# Patient Record
Sex: Female | Born: 1950
Health system: Southern US, Community
[De-identification: ages and names within clinical notes are randomized; demographics above are authoritative.]

## PROBLEM LIST (undated history)

## (undated) DIAGNOSIS — C44320 Squamous cell carcinoma of skin of unspecified parts of face: Secondary | ICD-10-CM

## (undated) DIAGNOSIS — F32A Depression, unspecified: Secondary | ICD-10-CM

## (undated) DIAGNOSIS — M545 Low back pain, unspecified: Secondary | ICD-10-CM

## (undated) DIAGNOSIS — F319 Bipolar disorder, unspecified: Secondary | ICD-10-CM

## (undated) DIAGNOSIS — M21612 Bunion of left foot: Secondary | ICD-10-CM

## (undated) DIAGNOSIS — M1990 Unspecified osteoarthritis, unspecified site: Secondary | ICD-10-CM

## (undated) DIAGNOSIS — I10 Essential (primary) hypertension: Secondary | ICD-10-CM

## (undated) DIAGNOSIS — I639 Cerebral infarction, unspecified: Secondary | ICD-10-CM

## (undated) DIAGNOSIS — F329 Major depressive disorder, single episode, unspecified: Secondary | ICD-10-CM

## (undated) DIAGNOSIS — Z136 Encounter for screening for cardiovascular disorders: Secondary | ICD-10-CM

## (undated) HISTORY — DX: Bunion of left foot: M21.612

## (undated) HISTORY — DX: Essential (primary) hypertension: I10

## (undated) HISTORY — DX: Major depressive disorder, single episode, unspecified: F32.9

## (undated) HISTORY — PX: BLADDER SURGERY: SHX569

## (undated) HISTORY — PX: TUBAL LIGATION: SHX77

## (undated) HISTORY — DX: Squamous cell carcinoma of skin of unspecified parts of face: C44.320

## (undated) HISTORY — PX: KNEE ARTHROSCOPY: SUR90

## (undated) HISTORY — DX: Depression, unspecified: F32.A

## (undated) HISTORY — DX: Bipolar disorder, unspecified: F31.9

## (undated) HISTORY — PX: BLADDER AUGMENTATION: SHX1233

## (undated) HISTORY — DX: Encounter for screening for cardiovascular disorders: Z13.6

---

## 1998-06-03 ENCOUNTER — Ambulatory Visit (HOSPITAL_COMMUNITY): Admission: RE | Admit: 1998-06-03 | Discharge: 1998-06-03 | Payer: Self-pay | Admitting: Emergency Medicine

## 2002-04-14 ENCOUNTER — Emergency Department (HOSPITAL_COMMUNITY): Admission: EM | Admit: 2002-04-14 | Discharge: 2002-04-14 | Payer: Self-pay | Admitting: Emergency Medicine

## 2002-04-14 ENCOUNTER — Encounter: Payer: Self-pay | Admitting: Emergency Medicine

## 2002-04-15 ENCOUNTER — Ambulatory Visit (HOSPITAL_COMMUNITY): Admission: RE | Admit: 2002-04-15 | Discharge: 2002-04-15 | Payer: Self-pay | Admitting: Orthopedic Surgery

## 2002-04-15 ENCOUNTER — Encounter: Payer: Self-pay | Admitting: Orthopedic Surgery

## 2004-04-01 ENCOUNTER — Inpatient Hospital Stay: Payer: Self-pay | Admitting: Psychiatry

## 2004-04-01 ENCOUNTER — Other Ambulatory Visit: Payer: Self-pay

## 2004-04-05 ENCOUNTER — Ambulatory Visit: Payer: Self-pay | Admitting: Psychiatry

## 2004-12-20 ENCOUNTER — Encounter: Payer: Self-pay | Admitting: Psychiatry

## 2005-01-01 ENCOUNTER — Encounter: Payer: Self-pay | Admitting: Psychiatry

## 2005-01-11 ENCOUNTER — Ambulatory Visit: Payer: Self-pay

## 2005-02-01 ENCOUNTER — Encounter: Payer: Self-pay | Admitting: Psychiatry

## 2005-03-07 ENCOUNTER — Ambulatory Visit: Payer: Self-pay | Admitting: Psychiatry

## 2005-04-28 ENCOUNTER — Ambulatory Visit: Payer: Self-pay | Admitting: Psychiatry

## 2005-08-17 ENCOUNTER — Ambulatory Visit: Payer: Self-pay | Admitting: Psychiatry

## 2005-12-06 ENCOUNTER — Ambulatory Visit: Payer: Self-pay | Admitting: Psychiatry

## 2006-02-16 ENCOUNTER — Ambulatory Visit: Payer: Self-pay | Admitting: Psychiatry

## 2006-05-17 ENCOUNTER — Ambulatory Visit: Payer: Self-pay

## 2006-06-26 ENCOUNTER — Ambulatory Visit: Payer: Self-pay | Admitting: Psychiatry

## 2006-12-20 ENCOUNTER — Ambulatory Visit: Payer: Self-pay | Admitting: Psychiatry

## 2007-06-07 ENCOUNTER — Ambulatory Visit: Payer: Self-pay | Admitting: Psychiatry

## 2007-06-26 ENCOUNTER — Ambulatory Visit: Payer: Self-pay

## 2007-10-11 ENCOUNTER — Ambulatory Visit: Payer: Self-pay | Admitting: Psychiatry

## 2008-05-19 ENCOUNTER — Ambulatory Visit: Payer: Self-pay | Admitting: Psychiatry

## 2008-06-15 ENCOUNTER — Ambulatory Visit: Payer: Self-pay | Admitting: Psychiatry

## 2008-06-22 ENCOUNTER — Ambulatory Visit: Payer: Self-pay | Admitting: Internal Medicine

## 2008-06-24 ENCOUNTER — Ambulatory Visit: Payer: Self-pay | Admitting: Internal Medicine

## 2008-06-29 ENCOUNTER — Ambulatory Visit: Payer: Self-pay | Admitting: Internal Medicine

## 2008-07-02 ENCOUNTER — Ambulatory Visit: Payer: Self-pay | Admitting: Internal Medicine

## 2008-07-21 ENCOUNTER — Ambulatory Visit: Payer: Self-pay | Admitting: Internal Medicine

## 2008-07-31 ENCOUNTER — Ambulatory Visit: Payer: Self-pay | Admitting: Internal Medicine

## 2008-08-01 ENCOUNTER — Ambulatory Visit: Payer: Self-pay | Admitting: Internal Medicine

## 2008-09-29 ENCOUNTER — Other Ambulatory Visit: Payer: Self-pay | Admitting: Psychiatry

## 2008-10-20 LAB — HM PAP SMEAR: HM Pap smear: NORMAL

## 2008-12-08 ENCOUNTER — Ambulatory Visit: Payer: Self-pay | Admitting: Internal Medicine

## 2008-12-09 ENCOUNTER — Other Ambulatory Visit: Payer: Self-pay | Admitting: Internal Medicine

## 2008-12-14 ENCOUNTER — Ambulatory Visit: Payer: Self-pay | Admitting: Urology

## 2008-12-15 ENCOUNTER — Ambulatory Visit: Payer: Self-pay | Admitting: Urology

## 2009-05-12 ENCOUNTER — Other Ambulatory Visit: Payer: Self-pay | Admitting: Psychiatry

## 2009-05-20 ENCOUNTER — Inpatient Hospital Stay: Payer: Self-pay | Admitting: Internal Medicine

## 2009-07-06 ENCOUNTER — Ambulatory Visit: Payer: Self-pay

## 2009-10-27 ENCOUNTER — Other Ambulatory Visit: Payer: Self-pay | Admitting: Internal Medicine

## 2010-02-17 ENCOUNTER — Other Ambulatory Visit: Payer: Self-pay | Admitting: Psychiatry

## 2010-03-01 ENCOUNTER — Encounter: Payer: Self-pay | Admitting: Physician Assistant

## 2010-03-11 ENCOUNTER — Ambulatory Visit: Payer: Self-pay | Admitting: Internal Medicine

## 2010-03-16 ENCOUNTER — Ambulatory Visit: Payer: Self-pay | Admitting: Internal Medicine

## 2010-03-16 ENCOUNTER — Ambulatory Visit: Payer: Self-pay | Admitting: Pain Medicine

## 2010-03-22 ENCOUNTER — Ambulatory Visit: Payer: Self-pay | Admitting: Pain Medicine

## 2010-04-03 HISTORY — PX: TOTAL HIP ARTHROPLASTY: SHX124

## 2010-04-03 HISTORY — PX: JOINT REPLACEMENT: SHX530

## 2010-04-06 ENCOUNTER — Ambulatory Visit: Payer: Self-pay | Admitting: Pain Medicine

## 2010-04-17 ENCOUNTER — Ambulatory Visit: Payer: Self-pay | Admitting: General Practice

## 2010-05-20 ENCOUNTER — Other Ambulatory Visit: Payer: Self-pay | Admitting: Physician Assistant

## 2010-05-20 ENCOUNTER — Ambulatory Visit: Payer: Self-pay | Admitting: General Practice

## 2010-05-24 ENCOUNTER — Encounter: Payer: Self-pay | Admitting: Orthopedic Surgery

## 2010-06-02 ENCOUNTER — Encounter: Payer: Self-pay | Admitting: Orthopedic Surgery

## 2010-08-05 ENCOUNTER — Ambulatory Visit: Payer: Self-pay

## 2010-12-13 ENCOUNTER — Other Ambulatory Visit: Payer: Self-pay | Admitting: *Deleted

## 2010-12-13 MED ORDER — LOSARTAN POTASSIUM 50 MG PO TABS: 50.0000 mg | ORAL_TABLET | Freq: Every day | ORAL | Status: DC

## 2011-01-13 ENCOUNTER — Ambulatory Visit: Payer: Self-pay | Admitting: Internal Medicine

## 2011-08-21 ENCOUNTER — Encounter: Payer: Self-pay | Admitting: Orthopedic Surgery

## 2011-08-31 ENCOUNTER — Ambulatory Visit (INDEPENDENT_AMBULATORY_CARE_PROVIDER_SITE_OTHER): Payer: PRIVATE HEALTH INSURANCE | Admitting: Internal Medicine

## 2011-08-31 ENCOUNTER — Encounter: Payer: Self-pay | Admitting: Internal Medicine

## 2011-08-31 VITALS — BP 152/96 | HR 108 | Temp 98.3°F | Resp 16

## 2011-08-31 DIAGNOSIS — R079 Chest pain, unspecified: Secondary | ICD-10-CM

## 2011-08-31 DIAGNOSIS — D649 Anemia, unspecified: Secondary | ICD-10-CM

## 2011-08-31 DIAGNOSIS — I10 Essential (primary) hypertension: Secondary | ICD-10-CM | POA: Insufficient documentation

## 2011-08-31 DIAGNOSIS — F319 Bipolar disorder, unspecified: Secondary | ICD-10-CM | POA: Insufficient documentation

## 2011-08-31 DIAGNOSIS — D509 Iron deficiency anemia, unspecified: Secondary | ICD-10-CM | POA: Insufficient documentation

## 2011-08-31 NOTE — Progress Notes (Signed)
Subjective:    Patient ID: Katherine Jimenez, female    DOB: Sep 08, 1950, 61 y.o.   MRN: [id number]  HPI 61 year old female with history of anxiety, bipolar disorder, and hypertension presents for acute visit complaining of anemia and chest pain. She reports that approximately 2 weeks ago she tried to donate blood and was turned down because she was anemic with hemoglobin less than 12. This was never investigated further. Later that week, she was sitting and had sudden onset of squeezing pain in her mid sternum. This pain persisted for approximately 2 hours. It resolved without intervention. The pain did not radiate. It was associated with some shortness of breath, but no palpitations, diaphoresis, or nausea. She felt well until approximately 3 days later when she had a second episode of squeezing substernal chest pain which lasted for approximately one hour. She has also noticed some shortness of breath occasionally when laying down. She denies any palpitations. She denies any recent increased anxiety or stressors. She denies fatigue. She has not had any change in appetite, abdominal pain, change in bowel habits. She does take Zantac occasionally for GERD, which she describes as well controlled. She has never had colonoscopy (declined in past).  Outpatient Encounter Prescriptions as of 08/31/2011  Medication Sig Dispense Refill  . ALPRAZolam (XANAX) 0.5 MG tablet Take 0.5 mg by mouth at bedtime as needed.      Marland Kitchen aspirin 81 MG tablet Take 81 mg by mouth daily.      . Doxepin HCl (SILENOR) 6 MG TABS Take by mouth.      . lamoTRIgine (LAMICTAL) 200 MG tablet Take 200 mg by mouth 2 (two) times daily.      Marland Kitchen losartan (COZAAR) 50 MG tablet Take 1 tablet (50 mg total) by mouth daily.  30 tablet  11    Review of Systems  Constitutional: Negative for fever, chills, appetite change, fatigue and unexpected weight change.  HENT: Negative for ear pain, congestion, sore throat, trouble swallowing, neck pain, voice  change and sinus pressure.   Eyes: Negative for visual disturbance.  Respiratory: Positive for chest tightness and shortness of breath. Negative for cough, wheezing and stridor.   Cardiovascular: Positive for chest pain. Negative for palpitations and leg swelling.  Gastrointestinal: Negative for nausea, vomiting, abdominal pain, diarrhea, constipation, blood in stool, abdominal distention and anal bleeding.  Genitourinary: Negative for dysuria and flank pain.  Musculoskeletal: Negative for myalgias, arthralgias and gait problem.  Skin: Negative for color change and rash.  Neurological: Negative for dizziness and headaches.  Hematological: Negative for adenopathy. Does not bruise/bleed easily.  Psychiatric/Behavioral: Negative for suicidal ideas, sleep disturbance and dysphoric mood. The patient is nervous/anxious.    BP 152/96  Pulse 108  Temp(Src) 98.3 F (36.8 C) (Oral)  Resp 16  SpO2 97%     Objective:   Physical Exam  Constitutional: She is oriented to person, place, and time. She appears well-developed and well-nourished. No distress.  HENT:  Head: Normocephalic and atraumatic.  Right Ear: External ear normal.  Left Ear: External ear normal.  Nose: Nose normal.  Mouth/Throat: Oropharynx is clear and moist. No oropharyngeal exudate.  Eyes: Conjunctivae are normal. Pupils are equal, round, and reactive to light. Right eye exhibits no discharge. Left eye exhibits no discharge. No scleral icterus.  Neck: Normal range of motion. Neck supple. No tracheal deviation present. No thyromegaly present.  Cardiovascular: Normal rate, regular rhythm, normal heart sounds and intact distal pulses.  Exam reveals no gallop and no friction  rub.   No murmur heard. Pulmonary/Chest: Effort normal and breath sounds normal. No respiratory distress. She has no wheezes. She has no rales. She exhibits no tenderness.  Musculoskeletal: Normal range of motion. She exhibits no edema and no tenderness.    Lymphadenopathy:    She has no cervical adenopathy.  Neurological: She is alert and oriented to person, place, and time. No cranial nerve deficit. She exhibits normal muscle tone. Coordination normal.  Skin: Skin is warm and dry. No rash noted. She is not diaphoretic. No erythema. No pallor.  Psychiatric: She has a normal mood and affect. Her behavior is normal. Judgment and thought content normal.          Assessment & Plan:

## 2011-08-31 NOTE — Assessment & Plan Note (Signed)
BP well controlled today. Will check renal function with labs. Will continue Cozaar.

## 2011-08-31 NOTE — Assessment & Plan Note (Addendum)
Chest pain at rest atypical for angina, however pt has h/o DM and HTN and strong family h/o CAD.  Pt had Treadmill stress at [facility] in 2011 which was normal. EKG today normal. Will send labs including CMP, TSH, CBC, troponin. Will set up cardiology evaluation. Question if she would benefit from either repeat treadmill or nuclear stress test.  Alternative etiologies of chest pain include GERD, however pt on Zantac, or esophageal spasm. No other symptoms such as palpitations to suggest arrythmia.  No history of pulmonary issues, and pulmonary exam normal. Will also check CBC today given history of anemia. Demand ischemia would also be consideration.

## 2011-08-31 NOTE — Assessment & Plan Note (Signed)
Patient reports being told she was anemic when trying to donate blood. Will check CBC with labs today.

## 2011-08-31 NOTE — Assessment & Plan Note (Signed)
Symptoms stable on Lamictal. Will continue.

## 2011-09-01 ENCOUNTER — Ambulatory Visit (INDEPENDENT_AMBULATORY_CARE_PROVIDER_SITE_OTHER): Payer: PRIVATE HEALTH INSURANCE | Admitting: Cardiovascular Disease

## 2011-09-01 ENCOUNTER — Encounter: Payer: Self-pay | Admitting: Cardiovascular Disease

## 2011-09-01 VITALS — BP 150/90 | HR 101 | Ht 62.0 in | Wt 158.0 lb

## 2011-09-01 DIAGNOSIS — R079 Chest pain, unspecified: Secondary | ICD-10-CM

## 2011-09-01 DIAGNOSIS — I10 Essential (primary) hypertension: Secondary | ICD-10-CM

## 2011-09-01 DIAGNOSIS — R0602 Shortness of breath: Secondary | ICD-10-CM

## 2011-09-01 LAB — CBC WITH DIFFERENTIAL/PLATELET
Basophils Absolute: 0 10*3/uL (ref 0.0–0.1)
Basophils Relative: 0.4 % (ref 0.0–3.0)
Eosinophils Absolute: 0.1 10*3/uL (ref 0.0–0.7)
Eosinophils Relative: 2.1 % (ref 0.0–5.0)
HCT: 39.7 % (ref 36.0–46.0)
Hemoglobin: 13.1 g/dL (ref 12.0–15.0)
Lymphocytes Relative: 30.1 % (ref 12.0–46.0)
Lymphs Abs: 1.6 10*3/uL (ref 0.7–4.0)
MCHC: 33.1 g/dL (ref 30.0–36.0)
MCV: 92.3 fl (ref 78.0–100.0)
Monocytes Absolute: 0.4 10*3/uL (ref 0.1–1.0)
Monocytes Relative: 7.1 % (ref 3.0–12.0)
Neutro Abs: 3.1 10*3/uL (ref 1.4–7.7)
Neutrophils Relative %: 60.3 % (ref 43.0–77.0)
Platelets: 250 10*3/uL (ref 150.0–400.0)
RBC: 4.3 Mil/uL (ref 3.87–5.11)
RDW: 13.5 % (ref 11.5–14.6)
WBC: 5.2 10*3/uL (ref 4.5–10.5)

## 2011-09-01 LAB — LDL CHOLESTEROL, DIRECT: Direct LDL: 116 mg/dL

## 2011-09-01 LAB — COMPREHENSIVE METABOLIC PANEL
ALT: 14 U/L (ref 0–35)
AST: 18 U/L (ref 0–37)
Albumin: 4.4 g/dL (ref 3.5–5.2)
Alkaline Phosphatase: 88 U/L (ref 39–117)
BUN: 11 mg/dL (ref 6–23)
CO2: 26 mEq/L (ref 19–32)
Calcium: 9.8 mg/dL (ref 8.4–10.5)
Chloride: 103 mEq/L (ref 96–112)
Creatinine, Ser: 0.9 mg/dL (ref 0.4–1.2)
GFR: 70.7 mL/min (ref >60.00)
Glucose, Bld: 121 mg/dL — ABNORMAL HIGH (ref 70–99)
Potassium: 4.5 mEq/L (ref 3.5–5.1)
Sodium: 139 mEq/L (ref 135–145)
Total Bilirubin: 0.5 mg/dL (ref 0.3–1.2)
Total Protein: 7.2 g/dL (ref 6.0–8.3)

## 2011-09-01 LAB — LIPID PANEL
Cholesterol: 245 mg/dL — ABNORMAL HIGH (ref 0–200)
HDL: 118 mg/dL (ref >39.00)
Total CHOL/HDL Ratio: 2
Triglycerides: 74 mg/dL (ref 0.0–149.0)
VLDL: 14.8 mg/dL (ref 0.0–40.0)

## 2011-09-01 NOTE — Assessment & Plan Note (Signed)
Her blood pressure is elevated but she does seem to be anxious. If this remains elevated upon followup, a small dose beta blocker might be helpful.

## 2011-09-01 NOTE — Patient Instructions (Signed)
Your physician has requested that you have en exercise stress myoview. For further information please visit [email or web address]. Please follow instruction sheet, as given.  Your physician has requested that you have an echocardiogram. Echocardiography is a painless test that uses sound waves to create images of your heart. It provides your doctor with information about the size and shape of your heart and how well your heart's chambers and valves are working. This procedure takes approximately one hour. There are no restrictions for this procedure.  Follow up after tests.  

## 2011-09-01 NOTE — Assessment & Plan Note (Signed)
The patient's symptoms are somewhat atypical for angina. However, she has risk factors for coronary artery disease. Her physical exam is unremarkable except for mild tachycardia. ECG does not show any acute changes. She had a previous ischemic workup more than 2 years ago which was unremarkable. I recommend a treadmill nuclear stress test for further evaluation. I will also obtain an echocardiogram due to her increased dyspnea.  Her labs are still not back to review but will need to rule out significant underlying anemia which might be causing some supply demand ischemia.

## 2011-09-01 NOTE — Progress Notes (Signed)
HPI  61 year old female with history of anxiety, bipolar disorder, and hypertension who was referred by Dr. Dan Humphreys for evaluation of chest pain.  She reports that approximately 2 weeks ago she tried to donate blood and was turned down because she was anemic with hemoglobin less than 12. This was never investigated further. Later that week, she was sitting and had sudden onset of squeezing pain in her mid sternum. This pain persisted for approximately 2 hours. It resolved without intervention. The pain did not radiate. It was associated with some shortness of breath, but no palpitations, diaphoresis, or nausea. The discomfort did not worsen with physical activities. She felt well until approximately 3 days later when she had a second episode of squeezing substernal chest pain which lasted for approximately one hour. She has also noticed some shortness of breath occasionally when laying down. She denies any palpitations. She denies any recent increased anxiety or stressors. She denies fatigue. She has not had any change in appetite, abdominal pain, change in bowel habits. She does take Zantac occasionally for GERD, which she describes as well controlled.  She has no previous cardiac history. She was hospitalized in 2011 at [facility] with atypical chest pain. She had a CTA of the chest which showed no evidence of aortic aneurysm or pulmonary embolism. She ruled out for myocardial infarction and underwent a treadmill nuclear stress test which showed no evidence of ischemia with normal ejection fraction.  No Known Allergies   Current Outpatient Prescriptions on File Prior to Visit  Medication Sig Dispense Refill  . ALPRAZolam (XANAX) 0.5 MG tablet Take 0.5 mg by mouth at bedtime as needed.      Marland Kitchen aspirin 81 MG tablet Take 81 mg by mouth daily.      . Doxepin HCl (SILENOR) 6 MG TABS Take by mouth.      . lamoTRIgine (LAMICTAL) 200 MG tablet Take 200 mg by mouth 2 (two) times daily.      Marland Kitchen losartan (COZAAR)  50 MG tablet Take 1 tablet (50 mg total) by mouth daily.  30 tablet  11     Past Medical History  Diagnosis Date  . Hypertension   . Treadmill stress test negative for angina pectoris 2011  . Squamous cell skin cancer, face     [facility]  . Bunion, left   . Bipolar disorder   . Diabetes mellitus     diet controlled.      Past Surgical History  Procedure Date  . Joint replacement 2012    bilateral hip  . Bladder augmentation   . Knee arthroscopy   . Vaginal delivery     2  . Tubal ligation   . Total hip arthroplasty 2012     Family History  Problem Relation Age of Onset  . Heart disease Mother   . Stroke Mother   . Heart disease Father   . Heart disease Maternal Grandmother   . Heart disease Maternal Grandfather   . Heart disease Paternal Grandmother   . Heart disease Paternal Grandfather      History   Social History  . Marital Status: Divorced    Spouse Name: N/A    Number of Children: N/A  . Years of Education: N/A   Occupational History  . Not on file.   Social History Main Topics  . Smoking status: Never Smoker   . Smokeless tobacco: Never Used  . Alcohol Use: Yes     occassionally  . Drug Use:  No  . Sexually Active: Not on file   Other Topics Concern  . Not on file   Social History Narrative   Lives in [place] with husband.     ROS Constitutional: Negative for fever, chills, diaphoresis, activity change, appetite change and fatigue.  HENT: Negative for hearing loss, nosebleeds, congestion, sore throat, facial swelling, drooling, trouble swallowing, neck pain, voice change, sinus pressure and tinnitus.  Eyes: Negative for photophobia, pain, discharge and visual disturbance.  Respiratory: Negative for apnea, cough and wheezing.  Cardiovascular: Negative for  and leg swelling.  Gastrointestinal: Negative for nausea, vomiting, abdominal pain, diarrhea, constipation, blood in stool and abdominal distention.  Genitourinary: Negative for  dysuria, urgency, frequency, hematuria and decreased urine volume.  Musculoskeletal: Negative for myalgias, back pain, joint swelling, arthralgias and gait problem.  Skin: Negative for color change, pallor, rash and wound.  Neurological: Negative for dizziness, tremors, seizures, syncope, speech difficulty, weakness, light-headedness, numbness and headaches.  Psychiatric/Behavioral: Negative for suicidal ideas, hallucinations, behavioral problems and agitation. The patient is nervous/anxious.     PHYSICAL EXAM   BP 150/90  Pulse 101  Ht 5\' 2"  (1.575 m)  Wt 158 lb (71.668 kg)  BMI 28.90 kg/m2  Constitutional: She is oriented to person, place, and time. She appears well-developed and well-nourished. No distress.  HENT: No nasal discharge.  Head: Normocephalic and atraumatic.  Eyes: Pupils are equal and round. Right eye exhibits no discharge. Left eye exhibits no discharge.  Neck: Normal range of motion. Neck supple. No JVD present. No thyromegaly present.  Cardiovascular: Normal rate, regular rhythm, normal heart sounds. Exam reveals no gallop and no friction rub. No murmur heard.  Pulmonary/Chest: Effort normal and breath sounds normal. No stridor. No respiratory distress. She has no wheezes. She has no rales. She exhibits no tenderness.  Abdominal: Soft. Bowel sounds are normal. She exhibits no distension. There is no tenderness. There is no rebound and no guarding.  Musculoskeletal: Normal range of motion. She exhibits no edema and no tenderness.  Neurological: She is alert and oriented to person, place, and time. Coordination normal.  Skin: Skin is warm and dry. No rash noted. She is not diaphoretic. No erythema. No pallor.  Psychiatric: She has a normal mood and affect. Her behavior is normal. Judgment and thought content normal.      EKG: Sinus  Tachycardia  Low voltage in precordial leads. No significant ST or T wave changes.   ASSESSMENT AND PLAN

## 2011-09-02 DIAGNOSIS — Z136 Encounter for screening for cardiovascular disorders: Secondary | ICD-10-CM

## 2011-09-02 HISTORY — DX: Encounter for screening for cardiovascular disorders: Z13.6

## 2011-09-02 LAB — TROPONIN I: Troponin I: <0.01 ng/mL (ref <0.06)

## 2011-09-06 ENCOUNTER — Encounter: Payer: Self-pay | Admitting: Internal Medicine

## 2011-09-08 ENCOUNTER — Ambulatory Visit: Payer: Self-pay | Admitting: Cardiovascular Disease

## 2011-09-08 DIAGNOSIS — R079 Chest pain, unspecified: Secondary | ICD-10-CM

## 2011-09-08 DIAGNOSIS — R0602 Shortness of breath: Secondary | ICD-10-CM

## 2011-09-11 ENCOUNTER — Telehealth: Payer: Self-pay | Admitting: Cardiovascular Disease

## 2011-09-11 ENCOUNTER — Ambulatory Visit: Payer: PRIVATE HEALTH INSURANCE | Admitting: Cardiovascular Disease

## 2011-09-11 NOTE — Telephone Encounter (Signed)
Pt calling for stress test results °

## 2011-09-11 NOTE — Telephone Encounter (Signed)
LMOM that Dr. Kirke Corin has yet to read this test but we will call her as soon as we have results.  Test was done friday

## 2011-09-15 NOTE — Telephone Encounter (Signed)
Have you seen this test yet?

## 2011-09-15 NOTE — Telephone Encounter (Signed)
Patient calling back again for results of stress test, patient has to work nights next week so please call after 2pm to give results

## 2011-09-15 NOTE — Telephone Encounter (Signed)
Her stress test was normal. It was read by Dr. Mariah Milling.  Stress tests are usually read on same day. However, we don't get a copy until the report is signed. You can find the results on [facility] under Nuclear Medicine even before it is signed. Let me know if you want me to show you how to find it.

## 2011-09-19 ENCOUNTER — Telehealth: Payer: Self-pay | Admitting: Cardiovascular Disease

## 2011-09-19 NOTE — Telephone Encounter (Signed)
Pt informed of results.

## 2011-09-19 NOTE — Telephone Encounter (Signed)
Pt calling for stress test results °

## 2011-09-21 ENCOUNTER — Ambulatory Visit: Payer: PRIVATE HEALTH INSURANCE | Admitting: Cardiovascular Disease

## 2011-09-22 ENCOUNTER — Encounter: Payer: Self-pay | Admitting: Internal Medicine

## 2011-09-22 ENCOUNTER — Ambulatory Visit (INDEPENDENT_AMBULATORY_CARE_PROVIDER_SITE_OTHER): Payer: PRIVATE HEALTH INSURANCE | Admitting: Internal Medicine

## 2011-09-22 VITALS — BP 132/80 | HR 100 | Temp 98.0°F | Resp 16 | Wt 156.8 lb

## 2011-09-22 DIAGNOSIS — R079 Chest pain, unspecified: Secondary | ICD-10-CM

## 2011-09-22 DIAGNOSIS — M549 Dorsalgia, unspecified: Secondary | ICD-10-CM

## 2011-09-22 MED ORDER — TRAMADOL HCL 50 MG PO TABS: 50.0000 mg | ORAL_TABLET | Freq: Three times a day (TID) | ORAL | Status: AC | PRN

## 2011-09-22 MED ORDER — HYOSCYAMINE SULFATE 0.125 MG SL SUBL
0.1250 mg | SUBLINGUAL_TABLET | SUBLINGUAL | Status: DC | PRN
Start: 2011-09-22 — End: 2013-01-20

## 2011-09-22 NOTE — Progress Notes (Signed)
Patient ID: Katherine Jimenez, female   DOB: 1950-04-25, 61 y.o.   MRN: [id number]  Patient Active Problem List  Diagnosis  . Chest pain at rest  . Hypertension  . Bipolar disorder  . Anemia  . Back pain    Subjective:  CC:   Chief Complaint  Patient presents with  . Follow-up    HPI:   Katherine Jimenez a 61 y.o. female who presents for follow up on multiple issues..  She was recently evaluated for chest pain .  She had had a recent cardiac evaluation for chest pain with a stress test which was normal.  .She has a lesion on the left side on her nose that has been diagnosed as a s quamous cell  And is having  Mohs procedure next week by Meriel Pica by [facility]. 3rd issues is recurrent back pain seconday to scoliosis.  Her pain is  aggravated by left foot pain which is causing her to walk differently. And use different muscles. 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 Hypertension   . Treadmill stress test negative for angina pectoris 2011  . Squamous cell skin cancer, face     [facility]  . Bunion, left   . Bipolar disorder   . Diabetes mellitus     diet controlled.     Past Surgical History  Procedure Date  . Joint replacement 2012    bilateral hip  . Bladder augmentation   . Knee arthroscopy   . Vaginal delivery     2  . Tubal ligation   . Total hip arthroplasty 2012    The following portions of the patient's history were reviewed and updated as appropriate: Allergies, current medications, and problem list.    Review of Systems:  Comprehensive eview of systems was negative except those addressed in the HPI,  History   Social History  . Marital Status: Divorced    Spouse Name: N/A    Number of Children: N/A  . Years of Education: N/A   Occupational History  . Not on file.   Social History Main Topics  . Smoking status:  Never Smoker   . Smokeless tobacco: Never Used  . Alcohol Use: Yes     occassionally  . Drug Use: No  . Sexually Active: Not on file   Other Topics Concern  . Not on file   Social History Narrative   Lives in [place] with husband.    Objective:  BP 132/80  Pulse 100  Temp 98 F (36.7 C) (Oral)  Resp 16  Wt 156 lb 12 oz (71.101 kg)  SpO2 96%  General appearance: alert, cooperative and appears stated age Ears: normal TM's and external ear canals both ears Throat: lips, mucosa, and tongue normal; teeth and gums normal Neck: no adenopathy, no carotid bruit, supple, symmetrical, trachea midline and thyroid not enlarged, symmetric, no tenderness/mass/nodules Back: symmetric, no curvature. ROM normal. No CVA tenderness. Lungs: clear to auscultation bilaterally Heart: regular rate and rhythm, S1, S2 normal, no murmur, click, rub or gallop Abdomen: soft, non-tender; bowel sounds normal; no masses,  no organomegaly Pulses: 2+ and symmetric Skin: Skin color, texture, turgor normal. No rashes or lesions Lymph nodes: Cervical, supraclavicular, and axillary nodes normal.  Assessment and Plan:  Chest pain at rest With a recent normal stress test ,  we will presume esophageal spasm as a cause for her chest pain and add an  antispasmodic ,  Back pain Her pain is non radiating.  Given her esophageal symptoms, will avid NSAIDs. Trial of tramadol for management of pain.    Updated Medication List Outpatient Encounter Prescriptions as of 09/22/2011  Medication Sig Dispense Refill  . ALPRAZolam (XANAX) 0.5 MG tablet Take 0.5 mg by mouth at bedtime as needed.      Marland Kitchen aspirin 81 MG tablet Take 81 mg by mouth daily.      . Doxepin HCl (SILENOR) 6 MG TABS Take by mouth.      . lamoTRIgine (LAMICTAL) 200 MG tablet Take 200 mg by mouth 2 (two) times daily.      Marland Kitchen losartan (COZAAR) 50 MG tablet Take 1 tablet (50 mg total) by mouth daily.  30 tablet  11  . hyoscyamine (LEVSIN SL) 0.125 MG SL  tablet Place 1 tablet (0.125 mg total) under the tongue every 4 (four) hours as needed for cramping.  30 tablet  3  . traMADol (ULTRAM) 50 MG tablet Take 1 tablet (50 mg total) by mouth every 8 (eight) hours as needed for pain.  90 tablet  3     No orders of the defined types were placed in this encounter.    No Follow-up on file.

## 2011-09-24 ENCOUNTER — Encounter: Payer: Self-pay | Admitting: Internal Medicine

## 2011-09-24 DIAGNOSIS — M48061 Spinal stenosis, lumbar region without neurogenic claudication: Secondary | ICD-10-CM | POA: Insufficient documentation

## 2011-09-24 NOTE — Assessment & Plan Note (Signed)
With a recent normal stress test , we will presume esophageal spasm as a cause for her chest pain and add an  antispasmodic ,

## 2011-09-24 NOTE — Assessment & Plan Note (Signed)
Her pain is non radiating.  Given her esophageal symptoms, will avid NSAIDs. Trial of tramadol for management of pain.

## 2011-10-02 ENCOUNTER — Other Ambulatory Visit: Payer: Self-pay | Admitting: Cardiovascular Disease

## 2011-10-02 DIAGNOSIS — R079 Chest pain, unspecified: Secondary | ICD-10-CM

## 2011-10-03 ENCOUNTER — Ambulatory Visit: Payer: Self-pay | Admitting: Internal Medicine

## 2011-10-16 ENCOUNTER — Other Ambulatory Visit: Payer: Self-pay | Admitting: Psychiatry

## 2011-10-16 LAB — HEMOGLOBIN A1C: Hemoglobin A1C: 5.7 % (ref 4.2–6.3)

## 2011-10-19 ENCOUNTER — Encounter: Payer: Self-pay | Admitting: Internal Medicine

## 2011-11-14 ENCOUNTER — Other Ambulatory Visit: Payer: Self-pay | Admitting: Internal Medicine

## 2011-11-14 MED ORDER — LOSARTAN POTASSIUM 50 MG PO TABS: 50.0000 mg | ORAL_TABLET | Freq: Every day | ORAL | Status: DC

## 2011-12-15 ENCOUNTER — Other Ambulatory Visit: Payer: Self-pay | Admitting: *Deleted

## 2011-12-15 MED ORDER — LOSARTAN POTASSIUM 50 MG PO TABS: 50.0000 mg | ORAL_TABLET | Freq: Every day | ORAL | Status: DC

## 2011-12-15 NOTE — Telephone Encounter (Signed)
Rx sent to pharmacy   

## 2011-12-18 ENCOUNTER — Telehealth: Payer: Self-pay | Admitting: Internal Medicine

## 2011-12-18 NOTE — Telephone Encounter (Signed)
Caller: Jermisha/Patient; Patient Name: Katherine Jimenez; PCP: Deborra Medina (Adults only); Best Callback Phone Number: [phone]; Reason for call: Chest Pain/Chest Discomfort. Onset  12/17/11.  Afebrile.  Pt woke up with chest pressure that resolved in less than  a minute.  Pt says she was short of breath @ onset of symptoms but not sure if was related. Pt  had a thorough cardiac work up in June 2013. .  All emergent symptom ruled out per Chest Pain with exception to 'All other situations'.  See Provider within 72 hrs. Pt needing some reassurance, very anxious about event.  Scheduled an appt 12/19/11 with Dr. Derrel Nip.

## 2011-12-19 ENCOUNTER — Encounter: Payer: Self-pay | Admitting: Internal Medicine

## 2011-12-19 ENCOUNTER — Ambulatory Visit (INDEPENDENT_AMBULATORY_CARE_PROVIDER_SITE_OTHER): Payer: PRIVATE HEALTH INSURANCE | Admitting: Internal Medicine

## 2011-12-19 VITALS — BP 100/64 | HR 86 | Temp 98.7°F | Resp 14 | Wt 159.5 lb

## 2011-12-19 DIAGNOSIS — I208 Other forms of angina pectoris: Secondary | ICD-10-CM | POA: Insufficient documentation

## 2011-12-19 DIAGNOSIS — I209 Angina pectoris, unspecified: Secondary | ICD-10-CM

## 2011-12-19 DIAGNOSIS — I2089 Other forms of angina pectoris: Secondary | ICD-10-CM

## 2011-12-19 MED ORDER — NITROGLYCERIN 0.4 MG SL SUBL: 0.4000 mg | SUBLINGUAL_TABLET | SUBLINGUAL | Status: DC | PRN

## 2011-12-19 NOTE — Progress Notes (Signed)
Patient ID: Katherine Jimenez, female   DOB: 12-Nov-1950, 62 y.o.   MRN: [id number]  Patient Active Problem List  Diagnosis  . Chest pain at rest  . Hypertension  . Bipolar disorder  . Anemia  . Back pain  . Angina at rest    Subjective:  CC:   Chief Complaint  Patient presents with  . Chest Pain    Has had previous cards. work-up    HPI:   Katherine Jimenez a 61 y.o. female who presents  Episode of atypical Chest pressure which occurred Sunday morning while lying in bed. The pain did not wake her from sleep. She had no associated symptoms including diaphoresis jaw pain nausea or shortness of breath. She had a took an aspirin , went back to bed and the pain resolved. She has had a prior cardiology evaluation for chest pain in June which included an echocardiogram and a stress test. She's had no prior cardiac catheterization. There is a strong family history of coronary artery disease in multiple family members. She has a history of hypertension and diet controlled diabetes but has excellent fasting lipids and does not smoke.   Past Medical History  Diagnosis Date  . Hypertension   . Treadmill stress test negative for angina pectoris 2011  . Squamous cell skin cancer, face     [facility]  . Bunion, left   . Bipolar disorder   . Diabetes mellitus     diet controlled.     Past Surgical History  Procedure Date  . Joint replacement 2012    bilateral hip  . Bladder augmentation   . Knee arthroscopy   . Vaginal delivery     2  . Tubal ligation   . Total hip arthroplasty 2012         The following portions of the patient's history were reviewed and updated as appropriate: Allergies, current medications, and problem list.    Review of Systems:   12 Pt  review of systems was negative except those addressed in the HPI,     History   Social History  . Marital Status: Divorced    Spouse Name: N/A    Number of Children: N/A  . Years of Education: N/A   Occupational  History  . Not on file.   Social History Main Topics  . Smoking status: Never Smoker   . Smokeless tobacco: Never Used  . Alcohol Use: Yes     occassionally  . Drug Use: No  . Sexually Active: Not on file   Other Topics Concern  . Not on file   Social History Narrative   Lives in [place] with husband.    Objective:  BP 100/64  Pulse 86  Temp 98.7 F (37.1 C) (Oral)  Resp 14  Wt 159 lb 8 oz (72.349 kg)  SpO2 96%  General appearance: alert, cooperative and appears stated age Ears: normal TM's and external ear canals both ears Throat: lips, mucosa, and tongue normal; teeth and gums normal Neck: no adenopathy, no carotid bruit, supple, symmetrical, trachea midline and thyroid not enlarged, symmetric, no tenderness/mass/nodules Back: symmetric, no curvature. ROM normal. No CVA tenderness. Lungs: clear to auscultation bilaterally Heart: regular rate and rhythm, S1, S2 normal, no murmur, click, rub or gallop Abdomen: soft, non-tender; bowel sounds normal; no masses,  no organomegaly Pulses: 2+ and symmetric Skin: Skin color, texture, turgor normal. No rashes or lesions Lymph nodes: Cervical, supraclavicular, and axillary nodes normal.  Assessment and Plan:  Angina  at rest Her recent spontaneous episode resolved spontaneously although she did have she did get up and take an aspirin. She is frustrated by the recurrence of pain given her normal nonvasic cariac evaluation  In June. I've given her a prescription for sublingual nitroglycerin. If she has an episode that is resolved with nitroglycerin we will pursue cardiac catheterization for definitive evaluation of risk for MI.    Updated Medication List Outpatient Encounter Prescriptions as of 12/19/2011  Medication Sig Dispense Refill  . ALPRAZolam (XANAX) 0.5 MG tablet Take 0.5 mg by mouth at bedtime as needed.      Marland Kitchen aspirin 81 MG tablet Take 81 mg by mouth daily.      . Doxepin HCl (SILENOR) 6 MG TABS Take by mouth.        . lamoTRIgine (LAMICTAL) 200 MG tablet Take 200 mg by mouth 2 (two) times daily.      Marland Kitchen losartan (COZAAR) 50 MG tablet Take 1 tablet (50 mg total) by mouth daily.  90 tablet  0  . hyoscyamine (LEVSIN SL) 0.125 MG SL tablet Place 1 tablet (0.125 mg total) under the tongue every 4 (four) hours as needed for cramping.  30 tablet  3  . nitroGLYCERIN (NITROSTAT) 0.4 MG SL tablet Place 1 tablet (0.4 mg total) under the tongue every 5 (five) minutes as needed for chest pain.  50 tablet  3     No orders of the defined types were placed in this encounter.    No Follow-up on file.

## 2011-12-20 ENCOUNTER — Encounter: Payer: Self-pay | Admitting: Internal Medicine

## 2011-12-20 NOTE — Assessment & Plan Note (Signed)
Her recent spontaneous episode resolved spontaneously although she did have she did get up and take an aspirin. She is frustrated by the recurrence of pain given her normal nonvasic cariac evaluation  In June. I've given her a prescription for sublingual nitroglycerin. If she has an episode that is resolved with nitroglycerin we will pursue cardiac catheterization for definitive evaluation of risk for MI.

## 2012-02-24 ENCOUNTER — Other Ambulatory Visit: Payer: Self-pay | Admitting: General Practice

## 2012-02-24 LAB — HEMOGLOBIN A1C: Hemoglobin A1C: 5.9 % (ref 4.2–6.3)

## 2012-02-26 ENCOUNTER — Ambulatory Visit: Payer: Self-pay | Admitting: General Practice

## 2012-02-27 ENCOUNTER — Telehealth: Payer: Self-pay | Admitting: General Practice

## 2012-02-27 ENCOUNTER — Emergency Department: Payer: Self-pay | Admitting: Emergency Medicine

## 2012-02-27 ENCOUNTER — Telehealth: Payer: Self-pay | Admitting: Internal Medicine

## 2012-02-27 LAB — CBC
HCT: 39.3 % (ref 35.0–47.0)
HGB: 13.4 g/dL (ref 12.0–16.0)
MCH: 31.3 pg (ref 26.0–34.0)
MCHC: 34.1 g/dL (ref 32.0–36.0)
MCV: 92 fL (ref 80–100)
Platelet: 274 10*3/uL (ref 150–440)
RBC: 4.28 10*6/uL (ref 3.80–5.20)
RDW: 13.2 % (ref 11.5–14.5)
WBC: 5.2 10*3/uL (ref 3.6–11.0)

## 2012-02-27 LAB — BASIC METABOLIC PANEL
Anion Gap: 5 — ABNORMAL LOW (ref 7–16)
BUN: 10 mg/dL (ref 7–18)
Calcium, Total: 9.5 mg/dL (ref 8.5–10.1)
Chloride: 106 mmol/L (ref 98–107)
Co2: 29 mmol/L (ref 21–32)
Creatinine: 0.72 mg/dL (ref 0.60–1.30)
EGFR (African American): >60
EGFR (Non-African Amer.): >60
Glucose: 115 mg/dL — ABNORMAL HIGH (ref 65–99)
Osmolality: 279 (ref 275–301)
Potassium: 4 mmol/L (ref 3.5–5.1)
Sodium: 140 mmol/L (ref 136–145)

## 2012-02-27 LAB — TROPONIN I: Troponin-I: <0.02 ng/mL

## 2012-02-27 LAB — CK TOTAL AND CKMB (NOT AT ARMC)
CK, Total: 81 U/L (ref 21–215)
CK-MB: 0.5 ng/mL (ref 0.5–3.6)

## 2012-02-27 NOTE — Telephone Encounter (Signed)
Call fell as emergent and patient had disconnected; attempted to call back and left a message on voicemail

## 2012-02-27 NOTE — Telephone Encounter (Signed)
Pt came into the office today stating that she had presented to [facility] with back pain and tightness in her chest. Per the pt the EKG was normal and radiology reports showed that back problems could be contributed to scoliosis and other issues. Patient stated today that she was still having chest tightness and just did not feel right. Stated she had taken 3 nitroglycerin tablets and was still having symptoms. BP in the office was 126/78. Looked at Dr. Melina Schools last OV note and it stated that if Nitro did not help pt the next step would be a cardiac catheterization. Pt made aware of this and told she she go to ER due to symptoms. Before pt left she stated that she just was not sure if she wanted to take that step.

## 2012-02-28 ENCOUNTER — Ambulatory Visit (INDEPENDENT_AMBULATORY_CARE_PROVIDER_SITE_OTHER): Payer: PRIVATE HEALTH INSURANCE | Admitting: Cardiovascular Disease

## 2012-02-28 ENCOUNTER — Encounter: Payer: Self-pay | Admitting: Cardiovascular Disease

## 2012-02-28 ENCOUNTER — Telehealth: Payer: Self-pay

## 2012-02-28 VITALS — BP 148/82 | HR 93 | Ht 62.0 in | Wt 164.5 lb

## 2012-02-28 DIAGNOSIS — M549 Dorsalgia, unspecified: Secondary | ICD-10-CM

## 2012-02-28 DIAGNOSIS — I208 Other forms of angina pectoris: Secondary | ICD-10-CM

## 2012-02-28 DIAGNOSIS — I2089 Other forms of angina pectoris: Secondary | ICD-10-CM

## 2012-02-28 DIAGNOSIS — I209 Angina pectoris, unspecified: Secondary | ICD-10-CM

## 2012-02-28 DIAGNOSIS — R079 Chest pain, unspecified: Secondary | ICD-10-CM

## 2012-02-28 DIAGNOSIS — I10 Essential (primary) hypertension: Secondary | ICD-10-CM

## 2012-02-28 DIAGNOSIS — E785 Hyperlipidemia, unspecified: Secondary | ICD-10-CM

## 2012-02-28 NOTE — Assessment & Plan Note (Signed)
Visit to the emergency room yesterday for chest pain, released with followup today. Numerous episodes of chest pain over the past several years. She does have risk factors including high cholesterol, diabetes, family history. Given continued symptoms, we did discuss cardiac catheterization. She will think about this through the holiday weekend and used nitroglycerin for further episodes of chest pain. We will talk with her again early next week to determine if she would like to schedule cardiac catheterization.

## 2012-02-28 NOTE — Progress Notes (Signed)
Patient ID: Katherine Jimenez, female    DOB: 1951/02/14, 61 y.o.   MRN: [id number]  HPI Comments: 61 year old female with history of anxiety, bipolar disorder, and hypertension, patient of Dr. Derrel Nip, numerous episodes of chest pain dating back to 2011, with visit to the emergency room yesterday for chest pain. Added on to the schedule today in our clinic for further evaluation of chest pain symptoms.    She reports that she continues to have periodic episodes of back pain, chest pain. She developed stabbing back pain yesterday which she attributes to her scoliosis and DJD. X-rays at the hospital suggest thoracic scoliosis, severe at T8-T9 level. Also with L2-L3 level scoliosis and DJD. In addition to stabbing back pain, she developed chest pain. She described this as a burning, weight with pain level 4/10. She continues to have stuttering chest pain sometimes at rest, sometimes with exertion.   Stress test earlier in the year was essentially normal showing no ischemia. 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 She does have some anxiety and takes short-acting benzos on an occasional basis. She was concerned about recent episodes of chest pain as it has been lasting longer.  Recent blood work showed elevated cholesterol. She thinks this was nonfasting. Other blood work was drawn at the hospital but not available to [place] today   She was hospitalized in 2011 at [facility] with atypical chest pain. She had a CTA of the chest which showed no evidence of aortic aneurysm or pulmonary embolism. She ruled out for myocardial infarction and underwent a treadmill nuclear stress test which showed no evidence of ischemia with normal ejection fraction. Repeat stress test earlier this year  EKG shows normal sinus rhythm with rate 93 beats per minute with no significant ST or T wave changes   Outpatient Encounter Prescriptions as of 02/28/2012  Medication Sig Dispense Refill  . ALPRAZolam  (XANAX) 0.5 MG tablet Take 0.5 mg by mouth at bedtime as needed.      Marland Kitchen aspirin 81 MG tablet Take 81 mg by mouth daily.      . Doxepin HCl (SILENOR) 6 MG TABS Take by mouth.      . hyoscyamine (LEVSIN SL) 0.125 MG SL tablet Place 1 tablet (0.125 mg total) under the tongue every 4 (four) hours as needed for cramping.  30 tablet  3  . lamoTRIgine (LAMICTAL) 200 MG tablet Take 200 mg by mouth 2 (two) times daily.      Marland Kitchen losartan (COZAAR) 50 MG tablet Take 1 tablet (50 mg total) by mouth daily.  90 tablet  0  . naproxen (NAPROSYN) 250 MG tablet Take 250 mg by mouth 2 (two) times daily with a meal.      . nitroGLYCERIN (NITROSTAT) 0.4 MG SL tablet Place 1 tablet (0.4 mg total) under the tongue every 5 (five) minutes as needed for chest pain.  50 tablet  3     Review of Systems  Constitutional: Negative.   HENT: Negative.   Eyes: Negative.   Respiratory: Positive for chest tightness.   Cardiovascular: Positive for chest pain.  Gastrointestinal: Negative.   Musculoskeletal: Positive for back pain.  Skin: Negative.   Neurological: Negative.   Hematological: Negative.   Psychiatric/Behavioral: Negative.   All other systems reviewed and are negative.    BP 148/82  Pulse 93  Ht 5\' 2"  (1.575 m)  Wt 164 lb 8 oz (74.617 kg)  BMI 30.09 kg/m2  Physical Exam  Nursing note and vitals reviewed. Constitutional: She is oriented to person, place, and time. She appears well-developed and well-nourished.  HENT:  Head: Normocephalic.  Nose: Nose normal.  Mouth/Throat: Oropharynx is clear and moist.  Eyes: Conjunctivae normal are normal. Pupils are equal, round, and reactive to light.  Neck: Normal range of motion. Neck supple. No JVD present.  Cardiovascular: Normal rate, regular rhythm, S1 normal, S2 normal, normal heart sounds and intact distal pulses.  Exam reveals no gallop and no friction rub.   No murmur heard. Pulmonary/Chest: Effort normal and breath sounds normal. No respiratory distress.  She has no wheezes. She has no rales. She exhibits no tenderness.  Abdominal: Soft. Bowel sounds are normal. She exhibits no distension. There is no tenderness.  Musculoskeletal: Normal range of motion. She exhibits no edema and no tenderness.  Lymphadenopathy:    She has no cervical adenopathy.  Neurological: She is alert and oriented to person, place, and time. Coordination normal.  Skin: Skin is warm and dry. No rash noted. No erythema.  Psychiatric: She has a normal mood and affect. Her behavior is normal. Judgment and thought content normal.         Assessment and Plan       He is a

## 2012-02-28 NOTE — Assessment & Plan Note (Signed)
She reports that most recent numbers were nonfasting. As she is borderline diabetic, we'll need to watch these closely.

## 2012-02-28 NOTE — Patient Instructions (Addendum)
Please start bystolic 1/2 pill daily for heart rate and blood pressure OK to increase to a full pill if needed   Please call the office early next week to let us know if you would like a cardiac cath  Please call us if you have new issues that need to be addressed before your next appt.  Your physician wants you to follow-up in: 1 months.

## 2012-02-28 NOTE — Assessment & Plan Note (Signed)
Blood pressure mildly elevated, heart rate elevated. We have suggested she try low-dose bystolic 5 mg daily. She can titrate up to 10 mg as needed for hypertension.

## 2012-02-28 NOTE — Assessment & Plan Note (Signed)
Frequent episodes of back pain. Chest x-ray showing scoliosis and DJD in the thoracic and lumbar spine.

## 2012-02-28 NOTE — Telephone Encounter (Signed)
Dr. Mariah Milling says pt was seen in ER yesterday for CP. He asks that I call pt to see if she can come in this am to assess further I spoke with pt who says she will come now She denies active CP but "just woke up"

## 2012-03-04 ENCOUNTER — Telehealth: Payer: Self-pay

## 2012-03-04 NOTE — Telephone Encounter (Signed)
Pt called to say she thought about her discussion with Dr. Mariah Milling 02/28/12 and has decided to proceed with LHC She would like to schedule for tomorrow 03/05/12 at 0900 I will call her back with instructions at [phone]

## 2012-03-04 NOTE — Telephone Encounter (Signed)
Verbal instructions given to pt re:lhc scheduled for 03/05/12 Understanding verb

## 2012-03-05 ENCOUNTER — Ambulatory Visit: Payer: Self-pay | Admitting: Cardiovascular Disease

## 2012-03-05 DIAGNOSIS — I200 Unstable angina: Secondary | ICD-10-CM

## 2012-03-05 HISTORY — PX: CARDIAC CATHETERIZATION: SHX172

## 2012-03-08 ENCOUNTER — Other Ambulatory Visit: Payer: Self-pay

## 2012-03-29 ENCOUNTER — Ambulatory Visit: Payer: PRIVATE HEALTH INSURANCE | Admitting: Cardiovascular Disease

## 2012-04-25 ENCOUNTER — Ambulatory Visit: Payer: PRIVATE HEALTH INSURANCE | Admitting: Cardiovascular Disease

## 2012-07-22 ENCOUNTER — Other Ambulatory Visit: Payer: Self-pay

## 2012-07-22 LAB — CBC WITH DIFFERENTIAL/PLATELET
Basophil #: 0 10*3/uL (ref 0.0–0.1)
Basophil %: 0.8 %
Eosinophil #: 0.2 10*3/uL (ref 0.0–0.7)
Eosinophil %: 2.8 %
HCT: 38.7 % (ref 35.0–47.0)
HGB: 12.6 g/dL (ref 12.0–16.0)
Lymphocyte #: 2.2 10*3/uL (ref 1.0–3.6)
Lymphocyte %: 36.7 %
MCH: 30 pg (ref 26.0–34.0)
MCHC: 32.6 g/dL (ref 32.0–36.0)
MCV: 92 fL (ref 80–100)
Monocyte #: 0.4 x10 3/mm (ref 0.2–0.9)
Monocyte %: 6.1 %
Neutrophil #: 3.2 10*3/uL (ref 1.4–6.5)
Neutrophil %: 53.6 %
Platelet: 280 10*3/uL (ref 150–440)
RBC: 4.2 10*6/uL (ref 3.80–5.20)
RDW: 13.2 % (ref 11.5–14.5)
WBC: 5.9 10*3/uL (ref 3.6–11.0)

## 2012-07-22 LAB — BASIC METABOLIC PANEL
Anion Gap: 5 — ABNORMAL LOW (ref 7–16)
BUN: 12 mg/dL (ref 7–18)
Calcium, Total: 8.7 mg/dL (ref 8.5–10.1)
Chloride: 105 mmol/L (ref 98–107)
Co2: 28 mmol/L (ref 21–32)
Creatinine: 0.73 mg/dL (ref 0.60–1.30)
EGFR (African American): >60
EGFR (Non-African Amer.): >60
Glucose: 163 mg/dL — ABNORMAL HIGH (ref 65–99)
Osmolality: 279 (ref 275–301)
Potassium: 4.2 mmol/L (ref 3.5–5.1)
Sodium: 138 mmol/L (ref 136–145)

## 2012-07-22 LAB — SEDIMENTATION RATE: Erythrocyte Sed Rate: 6 mm/hr (ref 0–30)

## 2012-07-22 LAB — URIC ACID: Uric Acid: 3.3 mg/dL (ref 2.6–6.0)

## 2012-07-22 LAB — HEMOGLOBIN A1C: Hemoglobin A1C: 6.1 % (ref 4.2–6.3)

## 2012-09-04 ENCOUNTER — Other Ambulatory Visit: Payer: Self-pay | Admitting: Physician Assistant

## 2012-10-03 ENCOUNTER — Ambulatory Visit: Payer: Self-pay | Admitting: Internal Medicine

## 2012-10-08 ENCOUNTER — Telehealth: Payer: Self-pay | Admitting: Internal Medicine

## 2012-10-08 ENCOUNTER — Encounter: Payer: Self-pay | Admitting: Internal Medicine

## 2012-10-08 DIAGNOSIS — Z1211 Encounter for screening for malignant neoplasm of colon: Secondary | ICD-10-CM

## 2012-10-08 NOTE — Telephone Encounter (Signed)
Referral is in process as requested 

## 2012-10-08 NOTE — Telephone Encounter (Signed)
Pt is 27 and has never had a colonoscopy. She is wanting to know if she could go ahead and get one. She is not having problems but just wants to make sure everyhing is ok since she has waited this long to get one. She would prefer our GI physicians at [facility].

## 2012-10-20 LAB — HM MAMMOGRAPHY: HM Mammogram: NORMAL

## 2012-10-25 ENCOUNTER — Encounter: Payer: Self-pay | Admitting: Internal Medicine

## 2012-11-06 ENCOUNTER — Encounter: Payer: PRIVATE HEALTH INSURANCE | Admitting: Internal Medicine

## 2012-12-11 ENCOUNTER — Encounter: Payer: PRIVATE HEALTH INSURANCE | Admitting: Internal Medicine

## 2012-12-20 ENCOUNTER — Other Ambulatory Visit: Payer: Self-pay | Admitting: *Deleted

## 2012-12-20 MED ORDER — LOSARTAN POTASSIUM 50 MG PO TABS: 50.0000 mg | ORAL_TABLET | Freq: Every day | ORAL | Status: DC

## 2012-12-20 NOTE — Telephone Encounter (Signed)
Eprescribed.

## 2013-01-06 ENCOUNTER — Encounter: Payer: Self-pay | Admitting: Orthopedic Surgery

## 2013-01-18 ENCOUNTER — Emergency Department: Payer: Self-pay | Admitting: Internal Medicine

## 2013-01-20 ENCOUNTER — Ambulatory Visit (INDEPENDENT_AMBULATORY_CARE_PROVIDER_SITE_OTHER): Payer: 59 | Admitting: Internal Medicine

## 2013-01-20 ENCOUNTER — Other Ambulatory Visit (HOSPITAL_COMMUNITY)
Admission: RE | Admit: 2013-01-20 | Discharge: 2013-01-20 | Disposition: A | Discharge status: Home / Self Care | Payer: 59 | Source: Ambulatory Visit | Attending: Internal Medicine | Admitting: Internal Medicine

## 2013-01-20 ENCOUNTER — Encounter: Payer: Self-pay | Admitting: Internal Medicine

## 2013-01-20 VITALS — BP 130/80 | HR 86 | Temp 98.3°F | Ht 61.0 in | Wt 165.8 lb

## 2013-01-20 DIAGNOSIS — Z01419 Encounter for gynecological examination (general) (routine) without abnormal findings: Secondary | ICD-10-CM | POA: Insufficient documentation

## 2013-01-20 DIAGNOSIS — E669 Obesity, unspecified: Secondary | ICD-10-CM

## 2013-01-20 DIAGNOSIS — I2089 Other forms of angina pectoris: Secondary | ICD-10-CM

## 2013-01-20 DIAGNOSIS — Z0000 Encounter for general adult medical examination without abnormal findings: Secondary | ICD-10-CM

## 2013-01-20 DIAGNOSIS — I209 Angina pectoris, unspecified: Secondary | ICD-10-CM

## 2013-01-20 DIAGNOSIS — E785 Hyperlipidemia, unspecified: Secondary | ICD-10-CM

## 2013-01-20 DIAGNOSIS — I208 Other forms of angina pectoris: Secondary | ICD-10-CM

## 2013-01-20 DIAGNOSIS — I10 Essential (primary) hypertension: Secondary | ICD-10-CM

## 2013-01-20 DIAGNOSIS — Z1211 Encounter for screening for malignant neoplasm of colon: Secondary | ICD-10-CM

## 2013-01-20 DIAGNOSIS — Z124 Encounter for screening for malignant neoplasm of cervix: Secondary | ICD-10-CM

## 2013-01-20 DIAGNOSIS — Z1151 Encounter for screening for human papillomavirus (HPV): Secondary | ICD-10-CM | POA: Insufficient documentation

## 2013-01-20 MED ORDER — LOSARTAN POTASSIUM 50 MG PO TABS: 50.0000 mg | ORAL_TABLET | Freq: Every day | ORAL | Status: DC

## 2013-01-20 MED ORDER — OMEPRAZOLE 40 MG PO CPDR: 40.0000 mg | DELAYED_RELEASE_CAPSULE | Freq: Every day | ORAL | Status: DC

## 2013-01-20 NOTE — Progress Notes (Signed)
Patient ID: Katherine Jimenez, female   DOB: 03/03/1951, 62 y.o.   MRN: [id number]    Subjective:     Katherine Jimenez is a 62 y.o. female and is here for a comprehensive physical exam. The patient reports wrist pain .  In the interim she had a  cardiology evaluation in Nov  2013 including cardiac cath which was nonrevealing.  Her symptoms chest pain resolved with caffeine reduction  Last PAP 2010,  normal  Persistent arthritis   Attributed pain to OA and weight gain.  Uses naproxen daily .  Has been diagnosed with wrist tendonitis secondary to occupational repetitive motions and for the past 8 weeks has been wearing a brace on her right wrist and getting PT.  She reinjured it on  Saturday while decorating her house for Halloween,  Larey Seat into the bushes.  ER confirmed no fracutres.  Seeing [facility] who is directing hand therapy. 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    History   Social History  . Marital Status: Divorced    Spouse Name: N/A    Number of Children: N/A  . Years of Education: N/A   Occupational History  . Not on file.   Social History Main Topics  . Smoking status: Never Smoker   . Smokeless tobacco: Never Used  . Alcohol Use: Yes     Comment: occassionally  . Drug Use: No  . Sexual Activity: Not on file   Other Topics Concern  . Not on file   Social History Narrative   Lives in [place] with husband.         Health Maintenance  Topic Date Due  . Tetanus/tdap  02/15/1971  . Colonoscopy  02/14/2002  . Zostavax  02/15/2012  . Influenza Vaccine  11/01/2012  . Mammogram  10/21/2014  . Pap Smear  01/21/2016    The following portions of the patient's history were reviewed and updated as appropriate: allergies, current medications, past family history, past medical history, past social history, past surgical history and problem list.  Review of Systems A comprehensive review of systems was negative.   Objective:   General Appearance:     Alert, cooperative, no distress, appears stated age  Head:    Normocephalic, without obvious abnormality, atraumatic  Eyes:    PERRL, conjunctiva/corneas clear, EOM's intact, fundi    benign, both eyes  Ears:    Normal TM's and external ear canals, both ears  Nose:   Nares normal, septum midline, mucosa normal, no drainage    or sinus tenderness  Throat:   Lips, mucosa, and tongue normal; teeth and gums normal  Neck:   Supple, symmetrical, trachea midline, no adenopathy;    thyroid:  no enlargement/tenderness/nodules; no carotid   bruit or JVD  Back:     Symmetric, no curvature, ROM normal, no CVA tenderness  Lungs:     Clear to auscultation bilaterally, respirations unlabored  Chest Wall:    No tenderness or deformity   Heart:    Regular rate and rhythm, S1 and S2 normal, no murmur, rub   or gallop  Breast Exam:    No tenderness, masses, or nipple abnormality  Abdomen:     Soft, non-tender, bowel sounds active all four quadrants,    no masses, no organomegaly  Genitalia:    Pelvic: cervix normal in appearance, external genitalia normal, no adnexal masses or tenderness, no cervical motion tenderness, rectovaginal septum normal, uterus normal size, shape, and consistency and  vagina normal without discharge  Extremities:   Extremities normal, atraumatic, no cyanosis or edema  Pulses:   2+ and symmetric all extremities  Skin:   Skin color, texture, turgor normal, no rashes or lesions  Lymph nodes:   Cervical, supraclavicular, and axillary nodes normal  Neurologic:   CNII-XII intact, normal strength, sensation and reflexes    throughout     Assessment:   Visit for preventive health examination Annual comprehensive exam was done including breast, pelvic and PAP smear. All screenings have been addressed .   Hyperlipidemia Recent labs done at [facility] have been requested.  Statin therapy will be considered if her labs indicatd DM.   Hypertension Well controlled on current regimen. Renal  function was done in June and labs requested, no changes today.  Obesity, unspecified I have addressed  BMI and recommended wt loss of 10% of body weigh over the next 6 months using a low glycemic index diet and regular exercise a minimum of 5 days per week.    Angina at rest Cardiac cath was done Dec 2013 and showed no significant disease.    Updated Medication List Outpatient Encounter Prescriptions as of 01/20/2013  Medication Sig Dispense Refill  . ALPRAZolam (XANAX) 0.5 MG tablet Take 0.5 mg by mouth at bedtime as needed.      . Doxepin HCl (SILENOR) 6 MG TABS Take by mouth.      . lamoTRIgine (LAMICTAL) 200 MG tablet Take 200 mg by mouth 2 (two) times daily.      Marland Kitchen losartan (COZAAR) 50 MG tablet Take 1 tablet (50 mg total) by mouth daily.  90 tablet  1  . naproxen (NAPROSYN) 250 MG tablet Take 250 mg by mouth 2 (two) times daily with a meal.      . [DISCONTINUED] losartan (COZAAR) 50 MG tablet Take 1 tablet (50 mg total) by mouth daily.  30 tablet  0  . aspirin 81 MG tablet Take 81 mg by mouth daily.      Marland Kitchen omeprazole (PRILOSEC) 40 MG capsule Take 1 capsule (40 mg total) by mouth daily.  90 capsule  3  . [DISCONTINUED] hyoscyamine (LEVSIN SL) 0.125 MG SL tablet Place 1 tablet (0.125 mg total) under the tongue every 4 (four) hours as needed for cramping.  30 tablet  3  . [DISCONTINUED] nebivolol (BYSTOLIC) 10 MG tablet Take 1 tablet (10 mg total) by mouth daily.      . [DISCONTINUED] nitroGLYCERIN (NITROSTAT) 0.4 MG SL tablet Place 1 tablet (0.4 mg total) under the tongue every 5 (five) minutes as needed for chest pain.  50 tablet  3   No facility-administered encounter medications on file as of 01/20/2013.

## 2013-01-20 NOTE — Patient Instructions (Signed)
Your exam was normal;  The PAP smear should be resulted in a week  Pleas recheck on the Prevnar (pneumonia), TDaP and Zostrix (Shingles) vaccines  Please ask the Urgent Care to send me your recent labs.  This is  One version of a  "Low GI"  Diet:  It will still lower your blood sugars and allow you to lose 4 to 8  lbs  per month if you follow it carefully.  Your goal with exercise is a minimum of 30 minutes of aerobic exercise 5 days per week (Walking does not count once it becomes easy!)    All of the foods can be found at grocery stores and in bulk at [employer].  The Atkins protein bars and shakes are available in more varieties at Target, [employer] and [place].     7 AM Breakfast:  Choose from the following:  Low carbohydrate Protein  Shakes (I recommend the EAS AdvantEdge "Carb Control" shakes  Or the low carb shakes by Atkins.    2.5 carbs   Arnold's "Sandwhich Thin"toasted  w/ peanut butter (no jelly: about 20 net carbs  "Bagel Thin" with cream cheese and salmon: about 20 carbs   a scrambled egg/bacon/cheese burrito made with Mission's "carb balance" whole wheat tortilla  (about 10 net carbs )   Avoid cereal and bananas, oatmeal and cream of wheat and grits. They are loaded with carbohydrates!   10 AM: high protein snack  Protein bar by Atkins (the snack size, under 200 cal, usually < 6 net carbs).    A stick of cheese:  Around 1 carb,  100 cal     Dannon Light n Fit [place] Yogurt  (80 cal, 8 carbs)  Other so called "protein bars" and Greek yogurts tend to be loaded with carbohydrates.  Remember, in food advertising, the word "energy" is synonymous for " carbohydrate."  Lunch:   A Sandwich using the bread choices listed, Can use any  Eggs,  lunchmeat, grilled meat or canned tuna), avocado, regular mayo/mustard  and cheese.  A Salad using blue cheese, ranch,  Goddess or vinagrette,  No croutons or "confetti" and no "candied nuts" but regular nuts OK.   No pretzels or chips.   Pickles and miniature sweet peppers are a good low carb alternative that provide a "crunch"  The bread is the only source of carbohydrate in a sandwich and  can be decreased by trying some of these alternatives to traditional loaf bread  Joseph's makes a pita bread and a flat bread that are 50 cal and 4 net carbs available at [place] and [place].  This can be toasted to use with hummous as well  Toufayan makes a low carb flatbread that's 100 cal and 9 net carbs available at [employer] and [employer]'s makes 2 sizes of  Low carb whole wheat tortilla  (The large one is 210 cal and 6 net carbs) Avoid "Low fat dressings, as well as Barry Brunner and [place] dressings They are loaded with sugar!   3 PM/ Mid day  Snack:  Consider  1 ounce of  almonds, walnuts, pistachios, pecans, peanuts,  Macadamia nuts or a nut medley.  Avoid "granola"; the dried cranberries and raisins are loaded with carbohydrates. Mixed nuts as long as there are no raisins,  cranberries or dried fruit.     6 PM  Dinner:     Meat/fowl/fish with a green salad, and either broccoli, cauliflower, green beans, spinach, brussel sprouts  or  Lima beans. DO NOT BREAD THE PROTEIN!!      There is a low carb pasta by Dreamfield's that is acceptable and tastes great: only 5 digestible carbs/serving.( All grocery stores but BJs carry it )  Try Hurley Cisco Angelo's chicken piccata or chicken or eggplant parm over low carb pasta.(Lowes and BJs)   Marjory Lies Sanchez's "Carnitas" (pulled pork, no sauce,  0 carbs) or his beef pot roast to make a dinner burrito (at [employer]J's)  Pesto over low carb pasta (bj's sells a good quality pesto in the center refrigerated section of the deli   Whole wheat pasta is still full of digestible carbs and  Not as low in glycemic index as Dreamfield's.   Brown rice is still rice,  So skip the rice and noodles if you eat [place] or [place] (or at least limit to 1/2 cup)  9 PM snack :   Breyer's "low carb" fudgsicle or  ice cream bar  (Carb Smart line), or  Weight Watcher's ice cream bar , or another "no sugar added" ice cream;  a serving of fresh berries/cherries with whipped cream   Cheese or DANNON'S LlGHT N FIT G[place] YOGURT  Avoid bananas, pineapple, grapes  and watermelon on a regular basis because they are high in sugar.  THINK OF THEM AS DESSERT  Remember that snack Substitutions should be less than 10 NET carbs per serving and meals < 20 carbs. Remember to subtract fiber grams to get the "net carbs."

## 2013-01-21 DIAGNOSIS — Z01818 Encounter for other preprocedural examination: Secondary | ICD-10-CM | POA: Insufficient documentation

## 2013-01-21 DIAGNOSIS — E663 Overweight: Secondary | ICD-10-CM | POA: Insufficient documentation

## 2013-01-21 NOTE — Assessment & Plan Note (Signed)
I have addressed  BMI and recommended wt loss of 10% of body weigh over the next 6 months using a low glycemic index diet and regular exercise a minimum of 5 days per week.   

## 2013-01-21 NOTE — Assessment & Plan Note (Signed)
Recent labs done at [facility] have been requested.  Statin therapy will be considered if her labs indicatd DM.

## 2013-01-21 NOTE — Assessment & Plan Note (Addendum)
Well controlled on current regimen. Renal function was done in June and labs requested, no changes today.

## 2013-01-21 NOTE — Assessment & Plan Note (Addendum)
Cardiac cath was done Dec 2013 and showed no significant disease.

## 2013-01-21 NOTE — Assessment & Plan Note (Signed)
Annual comprehensive exam was done including breast, pelvic and PAP smear. All screenings have been addressed .  

## 2013-01-23 ENCOUNTER — Encounter: Payer: Self-pay | Admitting: *Deleted

## 2013-02-01 ENCOUNTER — Encounter: Payer: Self-pay | Admitting: Orthopedic Surgery

## 2013-03-03 ENCOUNTER — Encounter: Payer: Self-pay | Admitting: Orthopedic Surgery

## 2013-03-31 ENCOUNTER — Ambulatory Visit: Payer: 59 | Admitting: Internal Medicine

## 2013-05-09 ENCOUNTER — Encounter: Payer: Self-pay | Admitting: General Practice

## 2013-06-01 ENCOUNTER — Encounter: Payer: Self-pay | Admitting: General Practice

## 2013-07-18 ENCOUNTER — Telehealth: Payer: Self-pay | Admitting: Internal Medicine

## 2013-07-18 NOTE — Telephone Encounter (Signed)
Left message for patient to call office,

## 2013-07-18 NOTE — Telephone Encounter (Signed)
I received two order forms from [employer],  one for a back brace with traction,  One for diabetic supplies.  Did she request these>  I have not seen her since October,  and she has no history of DM.  Regarding,  The order from for a lumbar traction device for her back pain. I cannot order it bc it is not in my area of expertise to know whether it is recommended for her condition.  I am happy to have her see an ortoedist,  Sports medicine doctor,  Or PT if she would like

## 2013-07-21 ENCOUNTER — Other Ambulatory Visit: Payer: Self-pay | Admitting: Internal Medicine

## 2013-07-21 NOTE — Telephone Encounter (Signed)
Patient has HX she said of DM and previously on metformin. Patient stated she was going to put hold on everything with paper work due to this company trying to run up a large bill. FYI

## 2013-08-22 ENCOUNTER — Ambulatory Visit (INDEPENDENT_AMBULATORY_CARE_PROVIDER_SITE_OTHER): Payer: 59 | Admitting: Internal Medicine

## 2013-08-22 ENCOUNTER — Encounter: Payer: Self-pay | Admitting: Internal Medicine

## 2013-08-22 ENCOUNTER — Encounter (INDEPENDENT_AMBULATORY_CARE_PROVIDER_SITE_OTHER): Payer: Self-pay

## 2013-08-22 VITALS — BP 129/85 | HR 101 | Temp 98.1°F | Wt 161.0 lb

## 2013-08-22 DIAGNOSIS — IMO0002 Reserved for concepts with insufficient information to code with codable children: Secondary | ICD-10-CM

## 2013-08-22 DIAGNOSIS — M549 Dorsalgia, unspecified: Secondary | ICD-10-CM

## 2013-08-22 DIAGNOSIS — N8111 Cystocele, midline: Secondary | ICD-10-CM

## 2013-08-22 DIAGNOSIS — R7309 Other abnormal glucose: Secondary | ICD-10-CM

## 2013-08-22 DIAGNOSIS — R739 Hyperglycemia, unspecified: Secondary | ICD-10-CM

## 2013-08-22 LAB — POCT URINALYSIS DIPSTICK
Bilirubin, UA: NEGATIVE
Blood, UA: NEGATIVE
Glucose, UA: NEGATIVE
Ketones, UA: NEGATIVE
Nitrite, UA: NEGATIVE
Protein, UA: NEGATIVE
Spec Grav, UA: 1.015
Urobilinogen, UA: 0.2
pH, UA: 7

## 2013-08-22 MED ORDER — DULOXETINE HCL 30 MG PO CPEP: 30.0000 mg | ORAL_CAPSULE | Freq: Two times a day (BID) | ORAL | Status: DC

## 2013-08-22 NOTE — Patient Instructions (Signed)
Referral to [facility] for your bladder issues  Trial of Cymbalta for your chronic musculoskeletal pain:  Start with 30 mg daily in the evening.  You can increase to 60 mg after two weeks if needed

## 2013-08-22 NOTE — Progress Notes (Signed)
Pre visit review using our clinic review tool, if applicable. No additional management support is needed unless otherwise documented below in the visit note. 

## 2013-08-22 NOTE — Progress Notes (Signed)
Patient ID: Katherine Jimenez, female   DOB: 1950-06-01, 63 y.o.   MRN: [id number]  Patient Active Problem List   Diagnosis Date Noted  . Cystocele 08/24/2013  . Visit for preventive health examination 01/21/2013  . Obesity, unspecified 01/21/2013  . Hyperlipidemia 02/28/2012  . Angina at rest 12/19/2011  . Back pain 09/24/2011  . Hypertension 08/31/2011  . Bipolar disorder 08/31/2011  . Anemia 08/31/2011    Subjective:  CC:   Chief Complaint  Patient presents with  . Back Pain    HPI:   Katherine Jimenez is a 63 y.o. female who presents for  2 issues:  Bladder prolapse.  Feels like the bladder is starting to prolapse . Dr.  Jacqlyn Larsen had done a bladder tack in 2010 at [facility], which was helpful for a while but she continues ro require protection  for mixed stress and urge incontinence .Marland Kitchen Discussed referral to dule urogyn    chronic back pain aggravated by work .  Had plain films in late Jan for non radiating back pian , ddd was seen with facet and arthropathy   Has infrequent episodes of sudden onset of arms aching followed by diffuse body aches   Then flu like symptoms including fever and aches and chills,  Had an evaluation for inflammatory arthritis last summer during one of the episodes by [facility] clinic and everything was normal.  Was trated for UTI.   Back in the fall was treated for flu with tamiflu and antibiotics.   Saw Ortho recently for bilateral groin pain,  Back pain and knee pain .  All hardware intact with hip replacements. 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  Has deferred pain clinic referral  Has tried exercise and chiropractics.     Past Medical History  Diagnosis Date  . Hypertension   . Treadmill stress test negative for angina pectoris June 2013  . Squamous cell skin cancer, face     [facility]  . Bunion, left   . Bipolar disorder   . Diabetes mellitus     diet controlled.     Past Surgical History  Procedure  Laterality Date  . Joint replacement  2012    bilateral hip  . Bladder augmentation    . Knee arthroscopy    . Vaginal delivery      2  . Tubal ligation    . Total hip arthroplasty  2012       The following portions of the patient's history were reviewed and updated as appropriate: Allergies, current medications, and problem list.    Review of Systems:   Patient denies headache, fevers, malaise, unintentional weight loss, skin rash, eye pain, sinus congestion and sinus pain, sore throat, dysphagia,  hemoptysis , cough, dyspnea, wheezing, chest pain, palpitations, orthopnea, edema, abdominal pain, nausea, melena, diarrhea, constipation, flank pain, dysuria, hematuria, urinary  Frequency, nocturia, numbness, tingling, seizures,  Focal weakness, Loss of consciousness,  Tremor, insomnia, depression, anxiety, and suicidal ideation.     History   Social History  . Marital Status: Divorced    Spouse Name: N/A    Number of Children: N/A  . Years of Education: N/A   Occupational History  . Not on file.   Social History Main Topics  . Smoking status: Never Smoker   . Smokeless tobacco: Never Used  . Alcohol Use: Yes     Comment: occassionally  . Drug Use: No  . Sexual Activity: Not on file   Other Topics  Concern  . Not on file   Social History Narrative   Lives in [place] with husband.          Objective:  Filed Vitals:   08/22/13 1533  BP: 129/85  Pulse: 101  Temp: 98.1 F (36.7 C)     General appearance: alert, cooperative and appears stated age Ears: normal TM's and external ear canals both ears Throat: lips, mucosa, and tongue normal; teeth and gums normal Neck: no adenopathy, no carotid bruit, supple, symmetrical, trachea midline and thyroid not enlarged, symmetric, no tenderness/mass/nodules Back: symmetric, no curvature. ROM normal. No CVA tenderness. Lungs: clear to auscultation bilaterally Heart: regular rate and rhythm, S1, S2 normal, no murmur,  click, rub or gallop Abdomen: soft, non-tender; bowel sounds normal; no masses,  no organomegaly Pulses: 2+ and symmetric Skin: Skin color, texture, turgor normal. No rashes or lesions Lymph nodes: Cervical, supraclavicular, and axillary nodes normal.  Assessment and Plan:  Cystocele With prior bladder tack , new onset symptoms.  Referral to [facility] discussed.   Back pain Persistent with multiple symptoms.  Discussed trial of cymbalta.    Updated Medication List Outpatient Encounter Prescriptions as of 08/22/2013  Medication Sig  . acetaminophen (TYLENOL) 325 MG tablet Take 650 mg by mouth every 6 (six) hours as needed.  . ALPRAZolam (XANAX) 0.5 MG tablet Take 0.5 mg by mouth at bedtime as needed.  . Doxepin HCl (SILENOR) 6 MG TABS Take by mouth.  . lamoTRIgine (LAMICTAL) 200 MG tablet Take 200 mg by mouth 2 (two) times daily.  Marland Kitchen losartan (COZAAR) 50 MG tablet Take 1 tablet (50 mg total) by mouth daily.  . naproxen (NAPROSYN) 250 MG tablet Take 250 mg by mouth 2 (two) times daily with a meal.  . omeprazole (PRILOSEC) 40 MG capsule Take 1 capsule (40 mg total) by mouth daily.  Marland Kitchen aspirin 81 MG tablet Take 81 mg by mouth daily.  . DULoxetine (CYMBALTA) 30 MG capsule Take 1 capsule (30 mg total) by mouth 2 (two) times daily.     Orders Placed This Encounter  Procedures  . Urine culture  . Hemoglobin A1c  . Comp Met (CMET)  . Ambulatory referral to Urogynecology  . POCT Urinalysis Dipstick    No Follow-up on file.

## 2013-08-23 LAB — COMPREHENSIVE METABOLIC PANEL
ALT: 17 U/L (ref 0–35)
AST: 15 U/L (ref 0–37)
Albumin: 4.5 g/dL (ref 3.5–5.2)
Alkaline Phosphatase: 84 U/L (ref 39–117)
BUN: 9 mg/dL (ref 6–23)
CO2: 26 mEq/L (ref 19–32)
Calcium: 9.7 mg/dL (ref 8.4–10.5)
Chloride: 104 mEq/L (ref 96–112)
Creat: 0.79 mg/dL (ref 0.50–1.10)
Glucose, Bld: 110 mg/dL — ABNORMAL HIGH (ref 70–99)
Potassium: 4.5 mEq/L (ref 3.5–5.3)
Sodium: 140 mEq/L (ref 135–145)
Total Bilirubin: 0.5 mg/dL (ref 0.2–1.2)
Total Protein: 6.9 g/dL (ref 6.0–8.3)

## 2013-08-23 LAB — HEMOGLOBIN A1C
Hgb A1c MFr Bld: 6.3 % — ABNORMAL HIGH (ref <5.7)
Mean Plasma Glucose: 134 mg/dL — ABNORMAL HIGH (ref <117)

## 2013-08-24 DIAGNOSIS — IMO0002 Reserved for concepts with insufficient information to code with codable children: Secondary | ICD-10-CM | POA: Insufficient documentation

## 2013-08-24 LAB — URINE CULTURE: Colony Count: 5000

## 2013-08-24 NOTE — Assessment & Plan Note (Signed)
Persistent with multiple symptoms.  Discussed trial of cymbalta.

## 2013-08-24 NOTE — Assessment & Plan Note (Signed)
With prior bladder tack , new onset symptoms.  Referral to [facility]ro[facility]ne[facility] discussed.

## 2013-08-27 ENCOUNTER — Encounter: Payer: Self-pay | Admitting: Internal Medicine

## 2013-08-29 NOTE — Telephone Encounter (Signed)
Pt notified of results

## 2013-09-02 ENCOUNTER — Telehealth: Payer: Self-pay | Admitting: Internal Medicine

## 2013-09-02 NOTE — Telephone Encounter (Signed)
Try taking the cymbalta at night and omitting the doxepin.  If no better in a week, stop the cymbalta

## 2013-09-02 NOTE — Telephone Encounter (Signed)
Patient taking 30 mg of Cymbalta for 2 weeks once daily and is feeling very tired and fatigued also taking doxepin at night for sleep  please advise. Please advise patient is concerned about the extreme fatigue.

## 2013-09-02 NOTE — Telephone Encounter (Signed)
Patient notified and voiced understanding.

## 2013-09-30 ENCOUNTER — Telehealth: Payer: Self-pay | Admitting: Internal Medicine

## 2013-09-30 DIAGNOSIS — IMO0002 Reserved for concepts with insufficient information to code with codable children: Secondary | ICD-10-CM

## 2013-10-09 ENCOUNTER — Ambulatory Visit: Payer: Self-pay | Admitting: Internal Medicine

## 2013-10-15 ENCOUNTER — Telehealth: Payer: Self-pay | Admitting: Internal Medicine

## 2013-10-15 NOTE — Telephone Encounter (Signed)
The patient is having chronic pain in her back legs and wants to be referred to Dr. Precious Reel . She also was referred to Dr. Evaristo Bury in urology and she says she would highly recommend her to any other patient's. She took a lot of time to explain to her many options that they had for her condition.

## 2013-10-15 NOTE — Telephone Encounter (Signed)
I'm glad the urology referral was a success  Regarding the back pain with bilateral leg pian,  I would like to see her first before referring her to a rheumatologist who may not be able to help her if the problem is spinal stenosis or  Due to her scoliosis.

## 2013-10-15 NOTE — Telephone Encounter (Signed)
Left message on pts VM of MDs message of her needing an appointment.

## 2013-10-21 ENCOUNTER — Other Ambulatory Visit: Payer: Self-pay | Admitting: Internal Medicine

## 2013-10-21 ENCOUNTER — Encounter: Payer: Self-pay | Admitting: Internal Medicine

## 2013-11-17 ENCOUNTER — Encounter: Payer: Self-pay | Admitting: Internal Medicine

## 2013-11-17 ENCOUNTER — Ambulatory Visit (INDEPENDENT_AMBULATORY_CARE_PROVIDER_SITE_OTHER): Payer: 59 | Admitting: Internal Medicine

## 2013-11-17 VITALS — BP 106/64 | HR 89 | Temp 98.6°F | Resp 16 | Ht 61.0 in | Wt 166.2 lb

## 2013-11-17 DIAGNOSIS — M545 Low back pain, unspecified: Secondary | ICD-10-CM

## 2013-11-17 DIAGNOSIS — E669 Obesity, unspecified: Secondary | ICD-10-CM

## 2013-11-17 MED ORDER — GABAPENTIN 100 MG PO CAPS: 100.0000 mg | ORAL_CAPSULE | Freq: Three times a day (TID) | ORAL | Status: DC

## 2013-11-17 MED ORDER — METFORMIN HCL 500 MG PO TABS: 500.0000 mg | ORAL_TABLET | Freq: Two times a day (BID) | ORAL | Status: DC

## 2013-11-17 NOTE — Progress Notes (Signed)
Pre-visit discussion using our clinic review tool. No additional management support is needed unless otherwise documented below in the visit note.  
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
## 2013-11-17 NOTE — Patient Instructions (Addendum)
Resuming metformin 500 mg twice daily  Adding gabapentin up to 300 mg at bedtime (start ing with 100 mg )   MRI thoracic spine  Reduce cymbalta to once daily for one week, then once every other day for one week then stop   This is  my version of a  "Low GI"  Diet:  It will still lower your blood sugars and allow you to lose 4 to 8  lbs  per month if you follow it carefully.  Your goal with exercise is a minimum of 30 minutes of aerobic exercise 5 days per week (Walking does not count once it becomes easy!)    All of the foods can be found at grocery stores and in bulk at [employer].  The Atkins protein bars and shakes are available in more varieties at Target, [employer] and [place].     7 AM Breakfast:  Choose from the following:  Low carbohydrate Protein  Shakes (I recommend the EAS AdvantEdge "Carb Control" shakes  Or the low carb shakes by Atkins.    2.5 carbs   Arnold's "Sandwhich Thin"toasted  w/ peanut butter (no jelly: about 20 net carbs  "Bagel Thin" with cream cheese and salmon: about 20 carbs   a scrambled egg/bacon/cheese burrito made with Mission's "carb balance" whole wheat tortilla  (about 10 net carbs )   Avoid cereal and bananas, oatmeal and cream of wheat and grits. They are loaded with carbohydrates!   10 AM: high protein snack  Protein bar by Atkins (the snack size, under 200 cal, usually < 6 net carbs).    A stick of cheese:  Around 1 carb,  100 cal     Dannon Light n Fit [place] Yogurt  (80 cal, 8 carbs)  Other so called "protein bars" and Greek yogurts tend to be loaded with carbohydrates.  Remember, in food advertising, the word "energy" is synonymous for " carbohydrate."  Lunch:   A Sandwich using the bread choices listed, Can use any  Eggs,  lunchmeat, grilled meat or canned tuna), avocado, regular mayo/mustard  and cheese.  A Salad using blue cheese, ranch,  Goddess or vinagrette,  No croutons or "confetti" and no "candied nuts" but regular nuts OK.   No  pretzels or chips.  Pickles and miniature sweet peppers are a good low carb alternative that provide a "crunch"  The bread is the only source of carbohydrate in a sandwich and  can be decreased by trying some of these alternatives to traditional loaf bread  Joseph's makes a pita bread and a flat bread that are 50 cal and 4 net carbs available at [place] and [place].  This can be toasted to use with hummous as well  Toufayan makes a low carb flatbread that's 100 cal and 9 net carbs available at [employer] and [employer]'s makes 2 sizes of  Low carb whole wheat tortilla  (The large one is 210 cal and 6 net carbs) Avoid "Low fat dressings, as well as Barry Brunner and [place] dressings They are loaded with sugar!   3 PM/ Mid day  Snack:  Consider  1 ounce of  almonds, walnuts, pistachios, pecans, peanuts,  Macadamia nuts or a nut medley.  Avoid "granola"; the dried cranberries and raisins are loaded with carbohydrates. Mixed nuts as long as there are no raisins,  cranberries or dried fruit.    Try the prosciutto/mozzarella cheese sticks by Fiorruci  In deli /backery section   High  protein      6 PM  Dinner:     Meat/fowl/fish with a green salad, and either broccoli, cauliflower, green beans, spinach, brussel sprouts or  Lima beans. DO NOT BREAD THE PROTEIN!!      There is a low carb pasta by Dreamfield's that is acceptable and tastes great: only 5 digestible carbs/serving.( All grocery stores but BJs carry it )  Try Hurley Cisco Angelo's chicken piccata or chicken or eggplant parm over low carb pasta.(Lowes and BJs)   Marjory Lies Sanchez's "Carnitas" (pulled pork, no sauce,  0 carbs) or his beef pot roast to make a dinner burrito (at BJ's)  Pesto over low carb pasta (bj's sells a good quality pesto in the center refrigerated section of the deli   Try satueeing  Cheral Marker with mushroooms  Whole wheat pasta is still full of digestible carbs and  Not as low in glycemic index as Dreamfield's.   Brown rice is  still rice,  So skip the rice and noodles if you eat [place] or [place] (or at least limit to 1/2 cup)  9 PM snack :   Breyer's "low carb" fudgsicle or  ice cream bar (Carb Smart line), or  Weight Watcher's ice cream bar , or another "no sugar added" ice cream;  a serving of fresh berries/cherries with whipped cream   Cheese or DANNON'S LlGHT N FIT GREEK YOGURT  8 ounces of [place] unsweetened almond/cococunut milk    Avoid bananas, pineapple, grapes  and watermelon on a regular basis because they are high in sugar.  THINK OF THEM AS DESSERT  Remember that snack Substitutions should be less than 10 NET carbs per serving and meals < 20 carbs. Remember to subtract fiber grams to get the "net carbs."

## 2013-11-18 ENCOUNTER — Encounter: Payer: Self-pay | Admitting: Internal Medicine

## 2013-11-18 NOTE — Assessment & Plan Note (Signed)
Persistent with multiple symptoms suggestive of spinal stenosis. . No improvement with  trial of cymbalta.  Trial of gabapentin . Needs MRI of lumbar spine .

## 2013-11-18 NOTE — Assessment & Plan Note (Addendum)
Wt loss continues due to inability to exercise due to back pain.. I have addressed  BMI and recommended wt loss of 10% of body weigh over the next 6 months using a low glycemic index diet and regular exercise a minimum of 5 days per week.  Resuming gabapentin 500 mg bid.  Taper cymbalta to off.

## 2013-11-24 ENCOUNTER — Telehealth: Payer: Self-pay | Admitting: Internal Medicine

## 2013-11-24 NOTE — Telephone Encounter (Signed)
Patient fell on Thursday and hit left rib area and her back, Patient is having an MRI on 11/28/13 of the thoracic, and is wondering if this should cover the area in question. Patient is going to work but if she cannot would like to know if you could write a note for work.

## 2013-11-24 NOTE — Telephone Encounter (Signed)
Yes it shoulder cover the area and I cannot write a note unless I have seen the patient.

## 2013-11-25 NOTE — Telephone Encounter (Signed)
Patient notified and voiced understanding does not wish to make an appointment at this time.

## 2013-11-28 ENCOUNTER — Ambulatory Visit: Payer: Self-pay | Admitting: Internal Medicine

## 2013-11-28 ENCOUNTER — Other Ambulatory Visit: Payer: Self-pay | Admitting: Internal Medicine

## 2013-11-28 DIAGNOSIS — M546 Pain in thoracic spine: Secondary | ICD-10-CM

## 2013-11-28 DIAGNOSIS — M419 Scoliosis, unspecified: Secondary | ICD-10-CM

## 2013-11-28 NOTE — Progress Notes (Unsigned)
Lumbar spine MRI per Dr. Derrel Nip

## 2013-12-01 ENCOUNTER — Telehealth: Payer: Self-pay | Admitting: Internal Medicine

## 2013-12-01 DIAGNOSIS — M546 Pain in thoracic spine: Secondary | ICD-10-CM

## 2013-12-01 NOTE — Telephone Encounter (Signed)
The thoracic MRI noted scoliosis and degenerative facet arthritis with no spinal or foraminal stenosis  MRI of the Lumbar spine showed stable scoliosis and degenerative changes with mild stenosis of the nerve root openings at the higher levels only (L1 to L3)   incidental noting a a single stone in the gallbladder   Would she like a referral to either Dr Sharlet Salina or to pain clinic ?

## 2013-12-02 NOTE — Telephone Encounter (Signed)
Patient notified and voiced understanding and would like to proceed with referral to Dr. Sharlet Salina. Please advise.

## 2013-12-02 NOTE — Telephone Encounter (Signed)
Referral is in process as requested to Dr Sharlet Salina

## 2013-12-15 ENCOUNTER — Telehealth: Payer: Self-pay | Admitting: Internal Medicine

## 2013-12-15 NOTE — Telephone Encounter (Signed)
Please cancel. Has already been done

## 2013-12-15 NOTE — Telephone Encounter (Signed)
Lu ann called from [facility] and stated patient due tomorrow for MRI lumbar spine please advise just done 11/28/13

## 2013-12-16 ENCOUNTER — Telehealth: Payer: Self-pay | Admitting: Internal Medicine

## 2013-12-16 MED ORDER — CYCLOBENZAPRINE HCL 10 MG PO TABS: 10.0000 mg | ORAL_TABLET | Freq: Three times a day (TID) | ORAL | Status: DC | PRN

## 2013-12-16 NOTE — Telephone Encounter (Signed)
Pt called in and would like [name] to give her a call back.

## 2013-12-16 NOTE — Telephone Encounter (Signed)
The appointment has been canceled.

## 2013-12-16 NOTE — Telephone Encounter (Signed)
Patient has an appointment with pain clinic 01/06/14 patient would like to know if you would consider a muscle relaxer due to spasm in back and patient cannot rest please advise.

## 2013-12-16 NOTE — Telephone Encounter (Signed)
Flexeril sent to [facility]
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
## 2013-12-17 NOTE — Telephone Encounter (Signed)
Left message script sent to pharmacy and if patient has questions or concerns to call office.

## 2013-12-26 ENCOUNTER — Encounter: Payer: Self-pay | Admitting: Internal Medicine

## 2014-01-19 ENCOUNTER — Other Ambulatory Visit: Payer: Self-pay | Admitting: Internal Medicine

## 2014-02-03 ENCOUNTER — Other Ambulatory Visit: Payer: Self-pay | Admitting: Internal Medicine

## 2014-04-08 LAB — HM DIABETES EYE EXAM

## 2014-04-10 ENCOUNTER — Other Ambulatory Visit: Payer: Self-pay | Admitting: Internal Medicine

## 2014-04-21 ENCOUNTER — Encounter: Payer: Self-pay | Admitting: Internal Medicine

## 2014-04-29 ENCOUNTER — Telehealth: Payer: Self-pay | Admitting: *Deleted

## 2014-04-29 ENCOUNTER — Other Ambulatory Visit: Payer: Self-pay | Admitting: Internal Medicine

## 2014-04-29 MED ORDER — GLUCOSE BLOOD VI STRP: ORAL_STRIP | Status: AC

## 2014-04-29 MED ORDER — FREESTYLE LANCETS MISC: Status: AC

## 2014-04-29 NOTE — Telephone Encounter (Signed)
sent 

## 2014-04-29 NOTE — Telephone Encounter (Signed)
Pt called requesting test strips and lancets for Freestyle lite meter.  This is a new Rx.  Pt states she is testing every night.  Please advise

## 2014-04-30 NOTE — Telephone Encounter (Signed)
Pt aware Rx sent.  

## 2014-05-05 ENCOUNTER — Encounter: Payer: Self-pay | Admitting: Internal Medicine

## 2014-05-20 ENCOUNTER — Telehealth: Payer: Self-pay | Admitting: *Deleted

## 2014-05-20 ENCOUNTER — Other Ambulatory Visit (INDEPENDENT_AMBULATORY_CARE_PROVIDER_SITE_OTHER): Payer: 59

## 2014-05-20 DIAGNOSIS — M1990 Unspecified osteoarthritis, unspecified site: Secondary | ICD-10-CM

## 2014-05-20 LAB — C-REACTIVE PROTEIN: CRP: 1.1 mg/dL (ref 0.5–20.0)

## 2014-05-20 LAB — SEDIMENTATION RATE: Sed Rate: 19 mm/hr (ref 0–22)

## 2014-05-20 LAB — URIC ACID: Uric Acid, Serum: 3.6 mg/dL (ref 2.4–7.0)

## 2014-05-20 NOTE — Telephone Encounter (Signed)
Labs are ordered for today as future orders

## 2014-05-20 NOTE — Telephone Encounter (Signed)
Pt came into the clinic, states on Monday after housecleaning she began having back pain.  On yesterday she has a massage, now today she is having body aches.  Pt said she thought she was told she would have labs the next time this happened.  Please advise

## 2014-05-20 NOTE — Telephone Encounter (Signed)
Spoke with pt, scheduled lab appointment

## 2014-05-21 LAB — RHEUMATOID FACTOR: Rhuematoid fact SerPl-aCnc: <10 IU/mL (ref <=14)

## 2014-05-22 LAB — HLA-B27 ANTIGEN: DNA Result:: NOT DETECTED

## 2014-05-22 LAB — ANA W/REFLEX IF POSITIVE: Anti Nuclear Antibody(ANA): NEGATIVE

## 2014-05-25 ENCOUNTER — Encounter: Payer: Self-pay | Admitting: Internal Medicine

## 2014-05-25 ENCOUNTER — Telehealth: Payer: Self-pay | Admitting: Internal Medicine

## 2014-05-25 ENCOUNTER — Other Ambulatory Visit: Payer: Self-pay | Admitting: Internal Medicine

## 2014-05-25 LAB — CYCLIC CITRUL PEPTIDE ANTIBODY, IGG: Cyclic Citrullin Peptide Ab: <2 U/mL (ref 0.0–5.0)

## 2014-05-25 NOTE — Telephone Encounter (Signed)
Please advise ok to fill 

## 2014-05-25 NOTE — Telephone Encounter (Signed)
    Patient calling for lab results 

## 2014-05-29 NOTE — Telephone Encounter (Signed)
Patient is calling for the results of her lab work °

## 2014-06-01 ENCOUNTER — Telehealth: Payer: Self-pay | Admitting: Internal Medicine

## 2014-06-01 NOTE — Telephone Encounter (Signed)
Patient called for lab results that are in [employer] . Tried to return call to patient, left message for patient to call.

## 2014-06-10 ENCOUNTER — Ambulatory Visit (INDEPENDENT_AMBULATORY_CARE_PROVIDER_SITE_OTHER): Payer: 59 | Admitting: Internal Medicine

## 2014-06-10 ENCOUNTER — Encounter: Payer: Self-pay | Admitting: Internal Medicine

## 2014-06-10 VITALS — BP 118/68 | HR 93 | Temp 98.5°F | Resp 16 | Ht 62.0 in | Wt 168.0 lb

## 2014-06-10 DIAGNOSIS — E785 Hyperlipidemia, unspecified: Secondary | ICD-10-CM

## 2014-06-10 DIAGNOSIS — K209 Esophagitis, unspecified without bleeding: Secondary | ICD-10-CM

## 2014-06-10 DIAGNOSIS — R7301 Impaired fasting glucose: Secondary | ICD-10-CM

## 2014-06-10 DIAGNOSIS — I10 Essential (primary) hypertension: Secondary | ICD-10-CM

## 2014-06-10 DIAGNOSIS — E119 Type 2 diabetes mellitus without complications: Secondary | ICD-10-CM

## 2014-06-10 DIAGNOSIS — M4806 Spinal stenosis, lumbar region: Secondary | ICD-10-CM

## 2014-06-10 DIAGNOSIS — Z1211 Encounter for screening for malignant neoplasm of colon: Secondary | ICD-10-CM

## 2014-06-10 DIAGNOSIS — M48061 Spinal stenosis, lumbar region without neurogenic claudication: Secondary | ICD-10-CM

## 2014-06-10 DIAGNOSIS — E669 Obesity, unspecified: Secondary | ICD-10-CM

## 2014-06-10 LAB — HM DIABETES FOOT EXAM: HM Diabetic Foot Exam: NORMAL

## 2014-06-10 NOTE — Patient Instructions (Addendum)
Continue omeprazole 40 mg in am  And add 20 mg famotidine in the  early afternoon pre dinner (rx sent to [facility])  GI referral  In process to Dr Olevia Perches for EGD/colonoscopy   This is my latest version of a  "Low GI"  Weight loss Diet.  It is appropriate for all patients with normal renal function , gluten tolerance, and advised for patients who have prediabetes or diabetes:   All of the foods can be found at grocery stores and in bulk at [employer].  The Atkins protein bars and shakes are available in more varieties at Target, [employer] and [place].     7 AM Breakfast:  Choose from the following:  < 5 carbs  Weekdays: Low carbohydrate Protein  Shakes (EAS AdvantEdge "Carb Control" shakes, Atkins,  Muscle Milk or Premier Protein shakes)     Weekends:  a scrambled egg/bacon/cheese burrito made with Mission's "carb  balance" whole wheat tortilla  (about 10 net carbs )  Eggs,  bacon /sausage , Joseph's pita /lavash bread or  (5 carbs)  A slice of fritatta ( egg based baked dish, no  crust:  google it) (< 10 carbs)   Avoid cereal and bananas, oatmeal and cream of wheat and grits. They are loaded with carbohydrates!   10 AM: high protein snack  (< 5 carbs)   Protein bar by Atkins  Or KIND  (the snack size, < 200 cal, usually < 6 carbs    A stick of cheese:  Around 1 carb,  100 cal      Other so called "protein bars" tend to be loaded with carbohydrates.  Remember, in food advertising, the word "energy" is synonymous for " carbohydrate."  Lunch:   A Sandwich using the bread choices listed, Can use any  Eggs,  lunchmeat, grilled meat or canned tuna).  Can add avocado, regular mayo/mustard  and cheese.  A Salad using blue cheese, ranch,  Goddess dressing  or vinagrette,  No croutons or "confetti" and no "candied nuts" but regular nuts OK.   2 HARD BOILED EGG WHITES AND A CUP OF one of these greek yogurts:    dannon lt n fit greek yogurt         chobani 100 greek yogurt,    Oikos triple zero greek  yogurt       No pretzels or chips.  Pickles and miniature sweet peppers are a good low  carb alternative that provide a "crunch"  The bread is the only source of carbohydrate in a sandwich and  can be decreased by trying some of these alternatives to traditional loaf bread:   Joseph's pita bread and Lavash (flat) bread :  50 cal and 4 net carbs  available at BJs and WalMart.  Taste better when toasted, use as pita chips  Touf[employer] makes a variety of  flatbreads and  A PITA POCKET.    LOOK FOR  THE ONES THAT ARE 17 NET CARBS OR LESS    Mission makes 2 sizes of  Low carb whole wheat tortillas  (The large one is  210 cal and 6 net carbs)   Avoid "Low fat dressings, as well as Barry Brunner and Walls dressings    3 PM/ Mid day  Snack:  Consider  1 ounce of  almonds, walnuts, pistachios, pecans, peanuts,  Macadamia nuts or a nut medley that does not contain raisins or cranberries.  No "granola"; the dried cranberries and raisins are loaded with  carbohydrates. Mixed nuts as long as there are no raisins,  cranberries or dried fruit.    Try the prosciutto/mozzarella cheese sticks by [employer]  In deli /backery section   High protein   To avoid overindulging in snacks: Try drinking a glass of unsweeted almond/coconut milk  Or a cup of coffee with your Atkins chocolate bar to keep you from having 3!!!   Pork rinds!  Yes Pork Rinds are low carb potato chip substitute!   Toasted Joseph's flatbread with hummous dip (chickpeas)       6 PM  Dinner:     Meat/fowl/fish with a green salad, and either broccoli, cauliflower, green beans, spinach, brussel sprouts, bok choy or  Lima beans. Fried in canola oil /olive oil BUT DO NOT BREAD THE PROTEIN!!      There is a low carb pasta by Dreamfield's that is acceptable and tastes great: only 5 digestible carbs/serving.( All grocery stores but BJs carry it )  Prepared Meals:  Try Hurley Cisco Angelo's chicken piccata or chicken or eggplant parm over low carb pasta.(Lowes  and BJs)   Marjory Lies Sanchez's "Carnitas" (pulled pork, no sauce,  0 carbs) or his beef pot roast to make a dinner burrito (at [employer])  Barbecue with cole slaw is low carb BUT NO BUN!  SAME WITH HAMBURGERS     Whole wheat pasta is still full of digestible carbs and  Not as low in glycemic index as Dreamfield's.   Brown rice is still rice,  So skip the rice and noodles if you eat [place] or [place] (or at least limit to 1/2 cup)  9 PM snack :   Breyer's "low carb" fudgsicle or  ice cream bar (Carb Smart line), or  Weight  Watcher's ice cream bar , or another "no sugar added" ice cream;  a serving of fresh berries/cherries with whipped cream   Cheese or greek yogurt   8 ounces of Blue Diamond unsweetened almond/cococunut milk  Cheese and crackers (using WASA crackers,  They are low carb) or peanut butter on low carb crackers or pita bread     Avoid bananas, pineapple, grapes  and watermelon on a regular basis because they are high in sugar.  THINK OF THEM AS DESSERT and do not have daily   Remember that snack Substitutions should be less than 10 NET carbs per serving and meals should be < 20 net carbs. Remember that carbohydrates from fiber do not affect blood sugar, so you can  subtract fiber grams to get the "net carbs " of any particular food item.

## 2014-06-10 NOTE — Progress Notes (Signed)
Pre-visit discussion using our clinic review tool. No additional management support is needed unless otherwise documented below in the visit note.  

## 2014-06-10 NOTE — Progress Notes (Signed)
Patient ID: Katherine Jimenez, female   DOB: 12-05-1950, 64 y.o.   MRN: [id number]  Patient Active Problem List   Diagnosis Date Noted  . Impaired fasting glucose 06/13/2014  . Cystocele 08/24/2013  . Visit for preventive health examination 01/21/2013  . Obesity 01/21/2013  . Hyperlipidemia 02/28/2012  . Angina at rest 12/19/2011  . Degenerative lumbar spinal stenosis 09/24/2011  . Hypertension 08/31/2011  . Bipolar disorder 08/31/2011  . Anemia 08/31/2011    Subjective:  CC:   Chief Complaint  Patient presents with  . Follow-up    general follow up, and talk about colonoscopy    HPI:   Katherine Jimenez is a 65 y.o. female who presents for follow up on hypertension,  hyperlipidemia, back pain ,  Obesity, and need for colon CA SCREENING     She has had persistent Back pain due to DDD which has improved with epidurals by [facility].  She has had had two thus far .  The relief from the last one was partly undone by a fall in the snow a short time afterward, but after a few days her pain resolved.    Reviewed recent labs that were done to investigate recurrent episodes of diffuse joint pain accompanied by subjective fevers  lasting 24 hours.  The episodes have been occurring more frequently and are accompanied by weakness and fatigue .  She notices that they are accompenied by  mood swings ,  mostly depressive episodes.  She has been seeing Dr. Bridgett Larsson for management of depression.    /referral to rheumatology was dicussed and accepted   Obesity:  She has gained more weight ,  Stress eating and eating sweets .  Not exercising    Past Medical History  Diagnosis Date  . Hypertension   . Treadmill stress test negative for angina pectoris June 2013  . Squamous cell skin cancer, face     [facility]  . Bunion, left   . Bipolar disorder   . Diabetes mellitus     diet controlled.     Past Surgical History  Procedure Laterality Date  . Joint replacement  2012    bilateral hip  . Bladder  augmentation    . Knee arthroscopy    . Vaginal delivery      2  . Tubal ligation    . Total hip arthroplasty  2012       The following portions of the patient's history were reviewed and updated as appropriate: Allergies, current medications, and problem list.    Review of Systems:   Patient denies headache, fevers, malaise, unintentional weight loss, skin rash, eye pain, sinus congestion and sinus pain, sore throat, dysphagia,  hemoptysis , cough, dyspnea, wheezing, chest pain, palpitations, orthopnea, edema, abdominal pain, nausea, melena, diarrhea, constipation, flank pain, dysuria, hematuria, urinary  Frequency, nocturia, numbness, tingling, seizures,  Focal weakness, Loss of consciousness,  Tremor, insomnia, depression, anxiety, and suicidal ideation.     History   Social History  . Marital Status: Married    Spouse Name: N/A  . Number of Children: N/A  . Years of Education: N/A   Occupational History  . Not on file.   Social History Main Topics  . Smoking status: Never Smoker   . Smokeless tobacco: Never Used  . Alcohol Use: Yes     Comment: occassionally  . Drug Use: No  . Sexual Activity: Not on file   Other Topics Concern  . Not on file   Social  History Narrative   Lives in [place] with [place].          Objective:  Filed Vitals:   06/10/14 1412  BP: 118/68  Pulse: 93  Temp: 98.5 F (36.9 C)  Resp: 16     General appearance: alert, cooperative and appears stated age Ears: normal TM's and external ear canals both ears Throat: lips, mucosa, and tongue normal; teeth and gums normal Neck: no adenopathy, no carotid bruit, supple, symmetrical, trachea midline and thyroid not enlarged, symmetric, no tenderness/mass/nodules Back: symmetric, no curvature. ROM normal. No CVA tenderness. Lungs: clear to auscultation bilaterally Heart: regular rate and rhythm, S1, S2 normal, no murmur, click, rub or gallop Abdomen: soft, non-tender; bowel sounds  normal; no masses,  no organomegaly Pulses: 2+ and symmetric Skin: Skin color, texture, turgor normal. No rashes or lesions Lymph nodes: Cervical, supraclavicular, and axillary nodes normal.  Assessment and Plan:  Hyperlipidemia Nonfasting LDL is < 100.   Lab Results  Component Value Date   CHOL 245* 08/31/2011   HDL 118.00 08/31/2011   LDLDIRECT 96.0 06/10/2014   TRIG 74.0 08/31/2011   CHOLHDL 2 08/31/2011      Hypertension Well controlled on current regimen. Renal function stable, no changes today.  Lab Results  Component Value Date   CREATININE 0.76 06/10/2014   Lab Results  Component Value Date   NA 138 06/10/2014   K 5.0 06/10/2014   CL 100 06/10/2014   CO2 32 06/10/2014      Obesity I have addressed  BMI and recommended wt loss of 10% of body weigh over the next 6 months using a low glycemic index diet and regular exercise a minimum of 5 days per week.  Body mass index is 30.72 kg/(m^2).    Degenerative lumbar spinal stenosis The thoracic MRI noted scoliosis and degenerative facet arthritis with no spinal or foraminal stenosis. MRI of the Lumbar spine showed stable scoliosis and degenerative changes with mild stenosis of the nerve root openings at the higher levels only (L1 to L3)  .  She has had improvement in pain with epidural injections by Dr. Loistine Chance   Impaired fasting glucose Her A1c is now 6.4 , with no fasting glucose above 121 .  I have recommended wt loss of 10% of body weigh over the next 6 months using a low glycemic index diet and regular exercise a minimum of 5 days per week.      A total of 25 minutes of face to face time was spent with patient more than half of which was spent in counselling on the above mentioned issues.  Updated Medication List Outpatient Encounter Prescriptions as of 06/10/2014  Medication Sig  . acetaminophen (TYLENOL) 325 MG tablet Take 650 mg by mouth every 6 (six) hours as needed.  . ALPRAZolam (XANAX) 0.5 MG  tablet Take 0.5 mg by mouth at bedtime as needed.  . cyclobenzaprine (FLEXERIL) 10 MG tablet Take 1 tablet (10 mg total) by mouth 3 (three) times daily as needed for muscle spasms.  . DULoxetine (CYMBALTA) 30 MG capsule TAKE 1 CAPSULE BY MOUTH TWICE DAILY (Patient taking differently: TAKE 1 CAPSULE BY MOUTH ONCE DAILY AT BEDTIME)  . glucose blood (FREESTYLE LITE) test strip Use once daily  as instructed to test blood sugar E 11.9  . lamoTRIgine (LAMICTAL) 200 MG tablet Take 200 mg by mouth 2 (two) times daily.  . Lancets (FREESTYLE) lancets Use once daily to check blood sugars  E11.9  . losartan (COZAAR) 50  MG tablet TAKE 1 TABLET (50 MG TOTAL) BY MOUTH DAILY.  . Melatonin 5 MG TABS Take 2 tablets by mouth at bedtime and may repeat dose one time if needed.  . metFORMIN (GLUCOPHAGE) 500 MG tablet Take 1 tablet (500 mg total) by mouth 2 (two) times daily with a meal.  . naproxen (NAPROSYN) 250 MG tablet Take 250 mg by mouth 2 (two) times daily with a meal.  . omeprazole (PRILOSEC) 40 MG capsule Take 1 capsule (40 mg total) by mouth daily.  . [DISCONTINUED] gabapentin (NEURONTIN) 100 MG capsule TAKE 1 CAPSULE BY MOUTH 3 TIMES DAILY.     Orders Placed This Encounter  Procedures  . Comprehensive metabolic panel  . Hemoglobin A1c  . Microalbumin / creatinine urine ratio  . LDL cholesterol, direct  . H. pylori antibody, IgG  . Comprehensive metabolic panel  . Hemoglobin A1c  . LDL cholesterol, direct  . H. pylori antibody, IgG  . Ambulatory referral to Gastroenterology  . HM DIABETES FOOT EXAM    No Follow-up on file.

## 2014-06-11 LAB — COMPREHENSIVE METABOLIC PANEL
ALT: 12 U/L (ref 0–35)
AST: 13 U/L (ref 0–37)
Albumin: 4.3 g/dL (ref 3.5–5.2)
Alkaline Phosphatase: 100 U/L (ref 39–117)
BUN: 8 mg/dL (ref 6–23)
CO2: 32 mEq/L (ref 19–32)
Calcium: 10 mg/dL (ref 8.4–10.5)
Chloride: 100 mEq/L (ref 96–112)
Creatinine, Ser: 0.76 mg/dL (ref 0.40–1.20)
GFR: 81.87 mL/min (ref >60.00)
Glucose, Bld: 102 mg/dL — ABNORMAL HIGH (ref 70–99)
Potassium: 5 mEq/L (ref 3.5–5.1)
Sodium: 138 mEq/L (ref 135–145)
Total Bilirubin: 0.3 mg/dL (ref 0.2–1.2)
Total Protein: 7 g/dL (ref 6.0–8.3)

## 2014-06-11 LAB — MICROALBUMIN / CREATININE URINE RATIO
Creatinine,U: 53.4 mg/dL
Microalb Creat Ratio: 1.3 mg/g (ref 0.0–30.0)
Microalb, Ur: <0.7 mg/dL (ref 0.0–1.9)

## 2014-06-11 LAB — LDL CHOLESTEROL, DIRECT: Direct LDL: 96 mg/dL

## 2014-06-11 LAB — H. PYLORI ANTIBODY, IGG: H Pylori IgG: NEGATIVE

## 2014-06-11 LAB — HEMOGLOBIN A1C: Hgb A1c MFr Bld: 6.4 % (ref 4.6–6.5)

## 2014-06-13 ENCOUNTER — Encounter: Payer: Self-pay | Admitting: Internal Medicine

## 2014-06-13 DIAGNOSIS — E119 Type 2 diabetes mellitus without complications: Secondary | ICD-10-CM | POA: Insufficient documentation

## 2014-06-13 NOTE — Assessment & Plan Note (Signed)
Her A1c is now 6.4 , with no fasting glucose above 121 .  I have recommended wt loss of 10% of body weigh over the next 6 months using a low glycemic index diet and regular exercise a minimum of 5 days per week.

## 2014-06-13 NOTE — Assessment & Plan Note (Signed)
I have addressed  BMI and recommended wt loss of 10% of body weigh over the next 6 months using a low glycemic index diet and regular exercise a minimum of 5 days per week.  Body mass index is 30.72 kg/(m^2).

## 2014-06-13 NOTE — Assessment & Plan Note (Signed)
Nonfasting LDL is < 100.   Lab Results  Component Value Date   CHOL 245* 08/31/2011   HDL 118.00 08/31/2011   LDLDIRECT 96.0 06/10/2014   TRIG 74.0 08/31/2011   CHOLHDL 2 08/31/2011

## 2014-06-13 NOTE — Assessment & Plan Note (Signed)
Well controlled on current regimen. Renal function stable, no changes today.  Lab Results  Component Value Date   CREATININE 0.76 06/10/2014   Lab Results  Component Value Date   NA 138 06/10/2014   K 5.0 06/10/2014   CL 100 06/10/2014   CO2 32 06/10/2014

## 2014-06-13 NOTE — Assessment & Plan Note (Addendum)
The thoracic MRI noted scoliosis and degenerative facet arthritis with no spinal or foraminal stenosis. MRI of the Lumbar spine showed stable scoliosis and degenerative changes with mild stenosis of the nerve root openings at the higher levels only (L1 to L3)  .  She has had improvement in pain with epidural injections by Dr. Loistine Chance

## 2014-06-14 ENCOUNTER — Encounter: Payer: Self-pay | Admitting: Internal Medicine

## 2014-06-17 ENCOUNTER — Encounter: Payer: Self-pay | Admitting: Internal Medicine

## 2014-06-25 NOTE — Telephone Encounter (Signed)
Pt seen in clinic 06/10/14 and results discussed then

## 2014-07-06 ENCOUNTER — Encounter: Payer: Self-pay | Admitting: *Deleted

## 2014-07-07 NOTE — Telephone Encounter (Signed)
Patient was mailed a copy of unread My Chart message.

## 2014-07-10 NOTE — Telephone Encounter (Signed)
Mailed Patient a copy of unread my chart message on 07/10/2014.

## 2014-08-11 ENCOUNTER — Ambulatory Visit: Payer: 59 | Admitting: Internal Medicine

## 2014-09-30 ENCOUNTER — Encounter: Payer: Self-pay | Admitting: Internal Medicine

## 2014-09-30 ENCOUNTER — Ambulatory Visit (INDEPENDENT_AMBULATORY_CARE_PROVIDER_SITE_OTHER): Payer: 59 | Admitting: Internal Medicine

## 2014-09-30 VITALS — BP 134/84 | HR 100 | Temp 98.5°F | Resp 14 | Ht 62.0 in | Wt 169.5 lb

## 2014-09-30 DIAGNOSIS — R7301 Impaired fasting glucose: Secondary | ICD-10-CM | POA: Diagnosis not present

## 2014-09-30 DIAGNOSIS — M48061 Spinal stenosis, lumbar region without neurogenic claudication: Secondary | ICD-10-CM

## 2014-09-30 DIAGNOSIS — M4806 Spinal stenosis, lumbar region: Secondary | ICD-10-CM | POA: Diagnosis not present

## 2014-09-30 DIAGNOSIS — I10 Essential (primary) hypertension: Secondary | ICD-10-CM

## 2014-09-30 DIAGNOSIS — M5431 Sciatica, right side: Secondary | ICD-10-CM | POA: Diagnosis not present

## 2014-09-30 NOTE — Patient Instructions (Signed)
Please call HR to request FMLA papers and I will fill the out for your requested leave starting today   Return in 3 months for DM follow up  MRI lumbar spine and Neurosurgical referral will follow if it appears there is a surgical issue

## 2014-09-30 NOTE — Assessment & Plan Note (Signed)
Well controlled on current regimen. Renal function stable, no changes today.  Lab Results  Component Value Date   CREATININE 0.76 06/10/2014   Lab Results  Component Value Date   NA 138 06/10/2014   K 5.0 06/10/2014   CL 100 06/10/2014   CO2 32 06/10/2014

## 2014-09-30 NOTE — Progress Notes (Signed)
Pre-visit discussion using our clinic review tool. No additional management support is needed unless otherwise documented below in the visit note.  

## 2014-09-30 NOTE — Assessment & Plan Note (Signed)
Her A1c is now 6.4 , with no fasting glucose above 121 .  I have recommended wt loss of 10% of body weight over the next 6 months using a low glycemic index diet and regular exercise a minimum of 5 days per week.

## 2014-09-30 NOTE — Progress Notes (Signed)
Subjective:  Patient ID: Katherine Jimenez, female    DOB: 01/06/1951  Age: 64 y.o. MRN: [id number]  CC: The primary encounter diagnosis was Sciatica, right. Diagnoses of Degenerative lumbar spinal stenosis, Impaired fasting glucose, and Essential hypertension were also pertinent to this visit.  HPI Katherine Jimenez presents for worsening, persistent back pain.  Patient has been seeing dr Loistine Chance for nonsurgical management of degenerative disk disease involving the lumbar spine.  She has had minimal lasting relief of her siiatica and suffered a fall two weeks ago while stepping off a curb.  She struck her right hip and the right side of her face on the curb but refused to go to the ER because she had to get to work.  The fall has aggravated her chronic lower back pain and she now has persistent radiculopathy into the groin and lower legs bilaterally,  right greater than left. without numbness or tingling. For the last two weeks she has been  using tylenol and naproxen and topical Icy Hot. There has been no significant improvement in her pain , and it has actually worsened.  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  Has difficulty climbing steps.  Has to pull herself up the steps.  Has had several falls.  .  No improvement with supine position , difficulty rolling over.  Saw  [facility] the following day, and he rx'd zanaflex and Pysical Therapy. He plans to give her her 4th epidural in 2 weeks if she has had no improvement.    The first injection provided relief of pain for 2 months, but she had  diminishing returns from the last  2 injections. She has tried gabapentin,  Already taking  cymbalta.  Does not want to start using narcotics.   Wants to take leave via FMLA starting today      Outpatient Prescriptions Prior to Visit  Medication Sig Dispense Refill  . acetaminophen (TYLENOL) 325 MG tablet Take 650 mg by mouth every 6 (six) hours as needed.    . ALPRAZolam (XANAX) 0.5 MG  tablet Take 0.5 mg by mouth at bedtime as needed.    . DULoxetine (CYMBALTA) 30 MG capsule TAKE 1 CAPSULE BY MOUTH TWICE DAILY (Patient taking differently: TAKE 1 CAPSULE BY MOUTH ONCE DAILY AT BEDTIME) 60 capsule 3  . glucose blood (FREESTYLE LITE) test strip Use once daily  as instructed to test blood sugar E 11.9 100 each 3  . lamoTRIgine (LAMICTAL) 200 MG tablet Take 200 mg by mouth 2 (two) times daily.    . Lancets (FREESTYLE) lancets Use once daily to check blood sugars  E11.9 100 each 3  . losartan (COZAAR) 50 MG tablet TAKE 1 TABLET (50 MG TOTAL) BY MOUTH DAILY. 90 tablet 1  . Melatonin 5 MG TABS Take 2 tablets by mouth at bedtime and may repeat dose one time if needed.    . metFORMIN (GLUCOPHAGE) 500 MG tablet Take 1 tablet (500 mg total) by mouth 2 (two) times daily with a meal. 180 tablet 3  . naproxen (NAPROSYN) 250 MG tablet Take 250 mg by mouth 2 (two) times daily with a meal.    . omeprazole (PRILOSEC) 40 MG capsule Take 1 capsule (40 mg total) by mouth daily. 90 capsule 3  . cyclobenzaprine (FLEXERIL) 10 MG tablet Take 1 tablet (10 mg total) by mouth 3 (three) times daily as needed for muscle spasms. (Patient not taking: Reported on 09/30/2014) 60 tablet 1  No facility-administered medications prior to visit.    Review of Systems;  Patient denies headache, fevers, malaise, unintentional weight loss, skin rash, eye pain, sinus congestion and sinus pain, sore throat, dysphagia,  hemoptysis , cough, dyspnea, wheezing, chest pain, palpitations, orthopnea, edema, abdominal pain, nausea, melena, diarrhea, constipation, flank pain, dysuria, hematuria, urinary  Frequency, nocturia, numbness, tingling, seizures,  Focal weakness, Loss of consciousness,  Tremor, insomnia, depression, anxiety, and suicidal ideation.      Objective:  BP 134/84 mmHg  Pulse 100  Temp(Src) 98.5 F (36.9 C) (Oral)  Resp 14  Ht '5\' 2"'$  (1.575 m)  Wt 169 lb 8 oz (76.885 kg)  BMI 30.99 kg/m2  SpO2 95%  BP  Readings from Last 3 Encounters:  09/30/14 134/84  06/10/14 118/68  11/17/13 106/64    Wt Readings from Last 3 Encounters:  09/30/14 169 lb 8 oz (76.885 kg)  06/10/14 168 lb (76.204 kg)  11/17/13 166 lb 4 oz (75.411 kg)    General appearance: alert, cooperative and appears stated age Ears: normal TM's and external ear canals both ears Throat: lips, mucosa, and tongue normal; teeth and gums normal Neck: no adenopathy, no carotid bruit, supple, symmetrical, trachea midline and thyroid not enlarged, symmetric, no tenderness/mass/nodules Back: symmetric, no curvature. ROM normal. No CVA tenderness. Lungs: clear to auscultation bilaterally Heart: regular rate and rhythm, S1, S2 normal, no murmur, click, rub or gallop Abdomen: soft, non-tender; bowel sounds normal; no masses,  no organomegaly Pulses: 2+ and symmetric Skin: Skin color, texture, turgor normal. No rashes or lesions Lymph nodes: Cervical, supraclavicular, and axillary nodes normal.  Lab Results  Component Value Date   HGBA1C 6.4 06/10/2014   HGBA1C 6.3* 08/22/2013    Lab Results  Component Value Date   CREATININE 0.76 06/10/2014   CREATININE 0.79 08/22/2013   CREATININE 0.73 07/22/2012    Lab Results  Component Value Date   WBC 5.9 07/22/2012   HGB 12.6 07/22/2012   HCT 38.7 07/22/2012   PLT 280 07/22/2012   GLUCOSE 102* 06/10/2014   CHOL 245* 08/31/2011   TRIG 74.0 08/31/2011   HDL 118.00 08/31/2011   LDLDIRECT 96.0 06/10/2014   ALT 12 06/10/2014   AST 13 06/10/2014   NA 138 06/10/2014   K 5.0 06/10/2014   CL 100 06/10/2014   CREATININE 0.76 06/10/2014   BUN 8 06/10/2014   CO2 32 06/10/2014   HGBA1C 6.4 06/10/2014   MICROALBUR <0.7 06/10/2014    No results found.  Assessment & Plan:   Problem List Items Addressed This Visit      Unprioritized   Hypertension    Well controlled on current regimen. Renal function stable, no changes today.  Lab Results  Component Value Date   CREATININE 0.76  06/10/2014   Lab Results  Component Value Date   NA 138 06/10/2014   K 5.0 06/10/2014   CL 100 06/10/2014   CO2 32 06/10/2014           Degenerative lumbar spinal stenosis    With worsening of symptoms ,  MRI Lumbar spine ordered  Agree with FMLA       Impaired fasting glucose    Her A1c is now 6.4 , with no fasting glucose above 121 .  I have recommended wt loss of 10% of body weight over the next 6 months using a low glycemic index diet and regular exercise a minimum of 5 days per week.  Other Visit Diagnoses    Sciatica, right    -  Primary    Relevant Medications    tiZANidine (ZANAFLEX) 4 MG tablet    Other Relevant Orders    MR Lumbar Spine Wo Contrast       I am having Ms. Ziesmer maintain her ALPRAZolam, lamoTRIgine, naproxen, acetaminophen, Melatonin, metFORMIN, cyclobenzaprine, omeprazole, losartan, DULoxetine, glucose blood, freestyle, and tiZANidine.  Meds ordered this encounter  Medications  . tiZANidine (ZANAFLEX) 4 MG tablet    Sig: Take 1 tablet by mouth 3 (three) times daily.    There are no discontinued medications.  Follow-up: Return in about 3 months (around 12/31/2014) for follow up diabetes.   Crecencio Mc, MD

## 2014-09-30 NOTE — Assessment & Plan Note (Signed)
With worsening of symptoms ,  MRI Lumbar spine ordered  Agree with FMLA

## 2014-10-02 ENCOUNTER — Other Ambulatory Visit: Payer: Self-pay | Admitting: Internal Medicine

## 2014-10-02 ENCOUNTER — Telehealth: Payer: Self-pay

## 2014-10-02 DIAGNOSIS — Z79899 Other long term (current) drug therapy: Secondary | ICD-10-CM

## 2014-10-02 NOTE — Telephone Encounter (Signed)
Please advise 

## 2014-10-02 NOTE — Telephone Encounter (Signed)
Acknowledged.

## 2014-10-02 NOTE — Telephone Encounter (Signed)
The patient called and is hoping to get her FMLA paperwork changed to reflect that she fell on June 15th.  She states right now, it is showing her FMLA starts on the 29th.   Pt callback - [phone]

## 2014-10-02 NOTE — Telephone Encounter (Signed)
Yes I wil reflect the date of the fall on the FMLA form when I fill it out. The letter was just to get the process going.

## 2014-10-05 ENCOUNTER — Telehealth: Payer: Self-pay | Admitting: Internal Medicine

## 2014-10-05 NOTE — Telephone Encounter (Signed)
FML form has been completed for initial period of 6 weeks starting 09/16/14 and will be revised once the MRI has been done  If needed.    Call for pickup .Marland Kitchen The charge is $50 for form completion

## 2014-10-06 DIAGNOSIS — Z7689 Persons encountering health services in other specified circumstances: Secondary | ICD-10-CM

## 2014-10-06 NOTE — Telephone Encounter (Signed)
Faxed to [employer]. Copy given for billing. Left message for pt to see if she wants to pick up copy also.

## 2014-10-07 ENCOUNTER — Ambulatory Visit
Admission: RE | Admit: 2014-10-07 | Discharge: 2014-10-07 | Disposition: A | Discharge status: Home / Self Care | Payer: 59 | Source: Ambulatory Visit | Attending: Internal Medicine | Admitting: Internal Medicine

## 2014-10-07 DIAGNOSIS — M5136 Other intervertebral disc degeneration, lumbar region: Secondary | ICD-10-CM | POA: Diagnosis not present

## 2014-10-07 DIAGNOSIS — M5431 Sciatica, right side: Secondary | ICD-10-CM | POA: Diagnosis present

## 2014-10-07 DIAGNOSIS — M4806 Spinal stenosis, lumbar region: Secondary | ICD-10-CM | POA: Insufficient documentation

## 2014-10-07 DIAGNOSIS — M47816 Spondylosis without myelopathy or radiculopathy, lumbar region: Secondary | ICD-10-CM | POA: Insufficient documentation

## 2014-10-07 DIAGNOSIS — M5126 Other intervertebral disc displacement, lumbar region: Secondary | ICD-10-CM | POA: Diagnosis not present

## 2014-10-07 DIAGNOSIS — M1288 Other specific arthropathies, not elsewhere classified, other specified site: Secondary | ICD-10-CM | POA: Insufficient documentation

## 2014-10-08 ENCOUNTER — Encounter: Payer: Self-pay | Admitting: Internal Medicine

## 2014-10-08 ENCOUNTER — Telehealth: Payer: Self-pay | Admitting: Internal Medicine

## 2014-10-08 DIAGNOSIS — M48061 Spinal stenosis, lumbar region without neurogenic claudication: Secondary | ICD-10-CM

## 2014-10-08 NOTE — Telephone Encounter (Signed)
Referral is in process as requested to Dr Arnoldo Morale

## 2014-10-08 NOTE — Telephone Encounter (Signed)
Please advise 

## 2014-10-08 NOTE — Telephone Encounter (Signed)
Patient notified of results and of MD recommendation patient at this time would like to consult with the pain clinic first , but will call back after discussion with pain clinic and advise of decision.

## 2014-10-08 NOTE — Telephone Encounter (Signed)
THE SECOND FORM, IS NOW COMPLETE,  THE ONE FROM [employer].  I STRONGLY URGE NEUROSURGICAL EVALUATION  BASED ON MRI RESULTS AND MY COMMENTS ON THE FMLA/DISABILITY FORMS ON WHICH I HAVE STATED THAT SHE NEEDS 6 WEEKS O F FMLA

## 2014-10-08 NOTE — Telephone Encounter (Signed)
Result note was sent yesterDAY

## 2014-10-08 NOTE — Telephone Encounter (Signed)
Pt aware.

## 2014-10-08 NOTE — Telephone Encounter (Signed)
Trying to get the results of her MRI and she is in a lot more pain. "Feeling terrible today".

## 2014-10-08 NOTE — Telephone Encounter (Signed)
Spoke with pt, advised of MRI results.  Pt is ok with seeing Dr Arnoldo Morale.  Please advise referral

## 2014-10-09 ENCOUNTER — Ambulatory Visit: Payer: 59 | Admitting: Internal Medicine

## 2014-10-13 ENCOUNTER — Telehealth: Payer: Self-pay

## 2014-10-13 NOTE — Telephone Encounter (Signed)
The patient called hoping to get an update on her neurology ref.

## 2014-10-16 ENCOUNTER — Telehealth: Payer: Self-pay | Admitting: *Deleted

## 2014-10-16 NOTE — Telephone Encounter (Signed)
Patient called and advised nurse records not received for patient disability claim through [employer], Re faxed everything  to  [facility] today as patient requested. OV notes, labs., MRI results.

## 2014-10-20 ENCOUNTER — Telehealth: Payer: Self-pay

## 2014-10-20 MED ORDER — PREDNISONE 10 MG PO TABS: ORAL_TABLET | ORAL | Status: DC

## 2014-10-20 NOTE — Telephone Encounter (Signed)
Ok to refill,  Refill sent  For prednisone taper

## 2014-10-20 NOTE — Telephone Encounter (Signed)
The pt called and is hoping to get a prednisone packet called in for her back pain.

## 2014-10-21 ENCOUNTER — Telehealth: Payer: Self-pay | Admitting: *Deleted

## 2014-10-21 NOTE — Telephone Encounter (Signed)
Check with pharmacy ,  i sent it yesterday (see chart)

## 2014-10-21 NOTE — Telephone Encounter (Signed)
Patient notified

## 2014-10-21 NOTE — Telephone Encounter (Signed)
Pt called states she does not see the Neurosurgeon until 8.5.16.  She is requesting a Rx for a Steroid Pack be called in until she is seen.  Please advise refill

## 2014-10-21 NOTE — Telephone Encounter (Signed)
Spoke with pt, advised Rx sent

## 2014-10-22 ENCOUNTER — Other Ambulatory Visit: Payer: Self-pay | Admitting: Internal Medicine

## 2014-11-09 ENCOUNTER — Telehealth: Payer: Self-pay | Admitting: *Deleted

## 2014-11-09 NOTE — Telephone Encounter (Signed)
The myelogram is necessary.  And would be ordered by any neurosurgeon before deciding if a patient has a surgical issue.  Dr Arnoldo Morale is one of the most competent neurosurgeons I know .  If she wants another referral she will be starting over.

## 2014-11-09 NOTE — Telephone Encounter (Signed)
Patient had a question about her referral to DR. Arnoldo Morale. She requested a call back.

## 2014-11-09 NOTE — Telephone Encounter (Signed)
Left message on VM to return call 

## 2014-11-09 NOTE — Telephone Encounter (Signed)
Spoke with pt, she states at her 8.5.16 appoint with Dr Arnoldo Morale he focused on her Scoleosis and not the current problem with her back.  Pt states he is wanting to do a Mylogram, pt is wanting to see someone else.  Please advise

## 2014-11-10 ENCOUNTER — Other Ambulatory Visit: Payer: Self-pay | Admitting: Neurosurgery

## 2014-11-10 DIAGNOSIS — M4120 Other idiopathic scoliosis, site unspecified: Secondary | ICD-10-CM

## 2014-11-10 NOTE — Telephone Encounter (Signed)
Spoke with pt, advised of MDs message.  Pt verbalized understanding 

## 2014-11-19 ENCOUNTER — Ambulatory Visit: Payer: 59

## 2014-11-19 ENCOUNTER — Other Ambulatory Visit: Payer: 59

## 2014-11-25 ENCOUNTER — Inpatient Hospital Stay: Admission: RE | Admit: 2014-11-25 | Payer: 59 | Source: Ambulatory Visit

## 2014-11-25 ENCOUNTER — Ambulatory Visit: Payer: 59

## 2014-11-27 ENCOUNTER — Ambulatory Visit
Admission: RE | Admit: 2014-11-27 | Discharge: 2014-11-27 | Disposition: A | Discharge status: Home / Self Care | Payer: 59 | Source: Ambulatory Visit | Attending: Neurosurgery | Admitting: Neurosurgery

## 2014-11-27 DIAGNOSIS — M4120 Other idiopathic scoliosis, site unspecified: Secondary | ICD-10-CM

## 2014-11-27 LAB — HEPATIC FUNCTION PANEL
ALT: 16 U/L (ref 14–54)
AST: 25 U/L (ref 15–41)
Albumin: 4.3 g/dL (ref 3.5–5.0)
Alkaline Phosphatase: 85 U/L (ref 38–126)
Bilirubin, Direct: <0.1 mg/dL — ABNORMAL LOW (ref 0.1–0.5)
Total Bilirubin: 0.5 mg/dL (ref 0.3–1.2)
Total Protein: 7.7 g/dL (ref 6.5–8.1)

## 2014-11-27 LAB — CBC
HCT: 33.6 % — ABNORMAL LOW (ref 35.0–47.0)
Hemoglobin: 10.4 g/dL — ABNORMAL LOW (ref 12.0–16.0)
MCH: 23.2 pg — ABNORMAL LOW (ref 26.0–34.0)
MCHC: 31 g/dL — ABNORMAL LOW (ref 32.0–36.0)
MCV: 74.7 fL — ABNORMAL LOW (ref 80.0–100.0)
Platelets: 414 10*3/uL (ref 150–440)
RBC: 4.5 MIL/uL (ref 3.80–5.20)
RDW: 16.9 % — ABNORMAL HIGH (ref 11.5–14.5)
WBC: 5.9 10*3/uL (ref 3.6–11.0)

## 2014-11-27 LAB — APTT: aPTT: 27 seconds (ref 24–36)

## 2014-11-27 LAB — PROTIME-INR
INR: 0.99
Prothrombin Time: 13.3 seconds (ref 11.4–15.0)

## 2014-12-18 ENCOUNTER — Ambulatory Visit (INDEPENDENT_AMBULATORY_CARE_PROVIDER_SITE_OTHER): Payer: 59 | Admitting: Internal Medicine

## 2014-12-18 ENCOUNTER — Encounter: Payer: Self-pay | Admitting: Internal Medicine

## 2014-12-18 VITALS — BP 138/88 | HR 101 | Temp 99.2°F | Ht 62.0 in | Wt 171.4 lb

## 2014-12-18 DIAGNOSIS — E538 Deficiency of other specified B group vitamins: Secondary | ICD-10-CM

## 2014-12-18 DIAGNOSIS — E559 Vitamin D deficiency, unspecified: Secondary | ICD-10-CM | POA: Diagnosis not present

## 2014-12-18 DIAGNOSIS — M4806 Spinal stenosis, lumbar region: Secondary | ICD-10-CM

## 2014-12-18 DIAGNOSIS — E669 Obesity, unspecified: Secondary | ICD-10-CM

## 2014-12-18 DIAGNOSIS — R5383 Other fatigue: Secondary | ICD-10-CM

## 2014-12-18 DIAGNOSIS — I10 Essential (primary) hypertension: Secondary | ICD-10-CM

## 2014-12-18 DIAGNOSIS — R7301 Impaired fasting glucose: Secondary | ICD-10-CM

## 2014-12-18 DIAGNOSIS — M48061 Spinal stenosis, lumbar region without neurogenic claudication: Secondary | ICD-10-CM

## 2014-12-18 LAB — VITAMIN B12: Vitamin B-12: 195 pg/mL — ABNORMAL LOW (ref 211–911)

## 2014-12-18 LAB — TSH: TSH: 0.681 u[IU]/mL (ref 0.350–4.500)

## 2014-12-18 MED ORDER — PREGABALIN 75 MG PO CAPS: 75.0000 mg | ORAL_CAPSULE | Freq: Two times a day (BID) | ORAL | Status: DC

## 2014-12-18 NOTE — Progress Notes (Signed)
Pre visit review using our clinic review tool, if applicable. No additional management support is needed unless otherwise documented below in the visit note. 

## 2014-12-18 NOTE — Patient Instructions (Signed)
I have written a letter for extension of  your FMLA  Starting lyrica 75 mg twice daily instead of gabapentin  We can increase this dose if needed

## 2014-12-18 NOTE — Progress Notes (Signed)
Subjective:  Patient ID: Katherine Jimenez, female    DOB: 1950-07-23  Age: 64 y.o. MRN: [id number]  CC: The primary encounter diagnosis was Other fatigue. Diagnoses of Vitamin D deficiency, B12 deficiency, Degenerative lumbar spinal stenosis, Impaired fasting glucose, Obesity, and Essential hypertension were also pertinent to this visit.  HPI Katherine Jimenez presents for follow up on persistent low back pain that has been present for a year,  Worse over the last 4 months Since her last visit she had a  CT myelogram that agreed with MRI lumbar spine confirming right sided neural impingement at L1-L3 due to scoliosis and bone spurs . There is also L5 nerve root impingement on the left.   Dr Arnoldo Morale has not offered surgery despite persistent pain in lower back, constant pain involving her  medial thighs bilaterally,   extension to the knee and occasionally to the foot. . She has been referred to the [facility] again and has a scheduled ESI next Tuesday.  She has been prescribed gabapentin 300 mg tid and has been referred to  aerobics.   2) She continues to have diffuse body aches without fevers which have changed from intermitted to constant,  Accompanied by bilateral joint pain involving all joints from the elbow distally. 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  Her LOA under FMLA has now come to an end .      Outpatient Prescriptions Prior to Visit  Medication Sig Dispense Refill  . acetaminophen (TYLENOL) 325 MG tablet Take 650 mg by mouth every 6 (six) hours as needed.    . ALPRAZolam (XANAX) 0.5 MG tablet Take 0.5 mg by mouth at bedtime as needed.    Marland Kitchen glucose blood (FREESTYLE LITE) test strip Use once daily  as instructed to test blood sugar E 11.9 100 each 3  . lamoTRIgine (LAMICTAL)  200 MG tablet Take 200 mg by mouth 2 (two) times daily.    . Lancets (FREESTYLE) lancets Use once daily to check blood sugars  E11.9 100 each 3  . losartan (COZAAR) 50 MG tablet TAKE 1 TABLET (50 MG TOTAL) BY MOUTH DAILY. 90 tablet 1  . Melatonin 5 MG TABS Take 2 tablets by mouth at bedtime and may repeat dose one time if needed.    . naproxen (NAPROSYN) 250 MG tablet Take 250 mg by mouth 2 (two) times daily with a meal.    . omeprazole (PRILOSEC) 40 MG capsule Take 1 capsule (40 mg total) by mouth daily. 90 capsule 3  . tiZANidine (ZANAFLEX) 4 MG tablet Take 1 tablet by mouth 3 (three) times daily.     No facility-administered medications prior to visit.    Review of Systems;  Patient denies headache, fevers, malaise, unintentional weight loss, skin rash, eye pain, sinus congestion and sinus pain, sore throat, dysphagia,  hemoptysis , cough, dyspnea, wheezing, chest pain, palpitations, orthopnea, edema, abdominal pain, nausea, melena, diarrhea, constipation, flank pain, dysuria, hematuria, urinary  Frequency, nocturia, numbness, tingling, seizures,  Focal weakness, Loss of consciousness,  Tremor, insomnia, depression, anxiety, and suicidal ideation.      Objective:  BP 138/88 mmHg  Pulse 101  Temp(Src) 99.2 F (37.3 C) (Oral)  Ht 5\' 2"  (1.575 m)  Wt 171 lb 6 oz (77.735  kg)  BMI 31.34 kg/m2  SpO2 96%  BP Readings from Last 3 Encounters:  12/18/14 138/88  11/27/14 120/76  11/27/14 113/78    Wt Readings from Last 3 Encounters:  12/18/14 171 lb 6 oz (77.735 kg)  11/27/14 165 lb (74.844 kg)  09/30/14 169 lb 8 oz (76.885 kg)    General appearance: alert, cooperative and appears stated age Ears: normal TM's and external ear canals both ears Throat: lips, mucosa, and tongue normal; teeth and gums normal Neck: no adenopathy, no carotid bruit, supple, symmetrical, trachea midline and thyroid not enlarged, symmetric, no tenderness/mass/nodules Back: symmetric, no curvature. ROM  normal. No CVA tenderness. Lungs: clear to auscultation bilaterally Heart: regular rate and rhythm, S1, S2 normal, no murmur, click, rub or gallop Abdomen: soft, non-tender; bowel sounds normal; no masses,  no organomegaly Pulses: 2+ and symmetric Skin: Skin color, texture, turgor normal. No rashes or lesions Lymph nodes: Cervical, supraclavicular, and axillary nodes normal.  Lab Results  Component Value Date   HGBA1C 6.4 06/10/2014   HGBA1C 6.3* 08/22/2013   HGBA1C 6.1 07/22/2012    Lab Results  Component Value Date   CREATININE 0.76 06/10/2014   CREATININE 0.79 08/22/2013   CREATININE 0.73 07/22/2012    Lab Results  Component Value Date   WBC 5.9 11/27/2014   HGB 10.4* 11/27/2014   HCT 33.6* 11/27/2014   PLT 414 11/27/2014   GLUCOSE 102* 06/10/2014   CHOL 245* 08/31/2011   TRIG 74.0 08/31/2011   HDL 118.00 08/31/2011   LDLDIRECT 96.0 06/10/2014   ALT 16 11/27/2014   AST 25 11/27/2014   NA 138 06/10/2014   K 5.0 06/10/2014   CL 100 06/10/2014   CREATININE 0.76 06/10/2014   BUN 8 06/10/2014   CO2 32 06/10/2014   TSH 0.681 12/18/2014   INR 0.99 11/27/2014   HGBA1C 6.4 06/10/2014   MICROALBUR <0.7 06/10/2014    Ct Lumbar Spine W Contrast  11/30/2014   CLINICAL DATA:  Back pain. RIGHT-sided radicular symptoms. Scoliosis.  EXAM: CT MYELOGRAPHY LUMBAR SPINE  TECHNIQUE: CT imaging of the lumbar spine was performed after intrathecal contrast administration. Multiplanar CT image reconstructions were also generated.  COMPARISON:  MRI lumbar spine 10/07/2014.  FINDINGS: Radiologist performed the  injection and was dictated separately.  Segmentation: Normal.  Alignment: No retrolisthesis. Approximately 20 degrees of convex LEFT degenerative scoliosis related to asymmetric loss of interspace height at L2-3 primarily but also at L1-2.  Vertebrae: No worrisome osseous lesion.  Conus medullaris: Normal in size, and location.  Paraspinal tissues: No evidence for hydronephrosis or  paravertebral mass.  Disc levels:  L1-L2: Rotoscoliosis convex LEFT due to asymmetric loss of interspace height on the RIGHT. RIGHT-sided spurring begins in the subarticular zone and extends into the foramen and beyond. RIGHT L1 and RIGHT L2 nerve root impingement are likely.  L2-L3: Asymmetric loss of interspace height on the RIGHT is prominent. Asymmetric facet arthropathy and ligamentum flavum calcification also worse on the RIGHT. RIGHT subarticular zone foraminal zone narrowing affect the L2 and L3 nerve roots.  L3-L4: Mild facet arthropathy. Slight extraforaminal spurring on the LEFT. Mild annular bulging. Slight LEFT-sided foraminal narrowing could affect the to L3 nerve root.  L4-L5: Severe facet arthropathy with vacuum phenomenon. Slight annular bulging. No stenosis, significant subarticular zone, or foraminal zone narrowing.  L5-S1: Advanced facet arthropathy, worse on the LEFT. Small foraminal protrusion on the LEFT. No stenosis or subarticular zone narrowing. LEFT-sided foraminal narrowing likely affects the L5 nerve root.  Compared  with priors, good general agreement.  IMPRESSION: RIGHT-sided symptomatology likely derives from neural impingement due to degenerative scoliosis at L1-2 and L2-3.  See discussion above.  Good general agreement with prior MR.   Electronically Signed   By: Staci Righter M.D.   On: 11/30/2014 08:30   Dg Myelogram Lumbar  11/27/2014   CLINICAL DATA:  Idiopathic scoliosis.  EXAM: LUMBAR MYELOGRAM  FLUOROSCOPY TIME:  41.8 mGy  PROCEDURE: After thorough discussion of risks and benefits of the procedure including bleeding, infection, injury to nerves, blood vessels, adjacent structures as well as headache and CSF leak, written and oral informed consent was obtained. Consent was obtained by Dr. Kathreen Devoid. Time out form was completed.  Patient was positioned prone on the fluoroscopy table. Local anesthesia was provided with 1% lidocaine without epinephrine after prepped and  draped in the usual sterile fashion. Puncture was performed at L4-5 using a 6 inch 22-gauge spinal needle via left paramedian approach. Using a single pass through the dura, the needle was placed within the thecal sac, with return of clear CSF. 10 mL of Isovue-300 M was injected into the thecal sac, with normal opacification of the nerve roots and cauda equina consistent with free flow within the subarachnoid space.  I personally performed the lumbar puncture and administered the intrathecal contrast. I also personally supervised acquisition of the myelogram images.  COMPARISON:  None  IMPRESSION: Successful fluoroscopic guided lumbar spine myelogram prior to CT scan.   Electronically Signed   By: Kathreen Devoid   On: 11/27/2014 11:49    Assessment & Plan:   Problem List Items Addressed This Visit      Unprioritized   Hypertension    Elevated today secondary to pain . Will reevaluate in one month and increase losartan if needed.   Lab Results  Component Value Date   CREATININE 0.76 06/10/2014   Lab Results  Component Value Date   NA 138 06/10/2014   K 5.0 06/10/2014   CL 100 06/10/2014   CO2 32 06/10/2014             Degenerative lumbar spinal stenosis    She perceives no improvement with gabapentin 300 mg tid.  Given her diffuse body aches, possible Dx of fibromyalgia,  trila of Lyrica starting at 75 mg bid .  She is not interested in narcotics .  FMLA extended for one month to give ESI a chance to work      Obesity    Reviewed her previous success at weight loss,  Her goal weight for BMI < 30 is 158 lbs). I have addressed  BMI and recommended wt loss using a low glycemic index diet and regular exercise a minimum of 5 days per week.         Impaired fasting glucose    Her A1c is now 6.4 , with no fasting glucose above 121 .  I have recommended wt loss of 10% of body weight over the next 6 months using a low glycemic index diet and regular exercise a minimum of 5 days per week.     Lab Results  Component Value Date   HGBA1C 6.4 06/10/2014          Other Visit Diagnoses    Other fatigue    -  Primary    Relevant Orders    TSH (Completed)    Vitamin D deficiency        Relevant Orders    Vitamin D (25 hydroxy) (Completed)  B12 deficiency        Relevant Orders    B12 (Completed)       I am having Ms. Eilert start on pregabalin. I am also having her maintain her ALPRAZolam, lamoTRIgine, naproxen, acetaminophen, Melatonin, omeprazole, glucose blood, freestyle, tiZANidine, losartan, and gabapentin.  Meds ordered this encounter  Medications  . gabapentin (NEURONTIN) 300 MG capsule    Sig: 1 po tid  . pregabalin (LYRICA) 75 MG capsule    Sig: Take 1 capsule (75 mg total) by mouth 2 (two) times daily.    Dispense:  60 capsule    Refill:  2    There are no discontinued medications.  Follow-up: No Follow-up on file.   Crecencio Mc, MD

## 2014-12-19 LAB — VITAMIN D 25 HYDROXY (VIT D DEFICIENCY, FRACTURES): Vit D, 25-Hydroxy: 32 ng/mL (ref 30–100)

## 2014-12-20 ENCOUNTER — Telehealth: Payer: Self-pay | Admitting: Internal Medicine

## 2014-12-20 NOTE — Assessment & Plan Note (Addendum)
She perceives no improvement with gabapentin 300 mg tid.  Given her diffuse body aches, possible Dx of fibromyalgia,  trila of Lyrica starting at 75 mg bid .  She is not interested in narcotics .  FMLA extended for one month to give E[employer] a chance to work

## 2014-12-20 NOTE — Assessment & Plan Note (Signed)
Reviewed her previous success at weight loss,  Her goal weight for BMI < 30 is 158 lbs). I have addressed  BMI and recommended wt loss using a low glycemic index diet and regular exercise a minimum of 5 days per week.

## 2014-12-20 NOTE — Telephone Encounter (Signed)
[employer] short term disability form submitted last Friday has been completed and  The charge was included in the office visit,  +

## 2014-12-20 NOTE — Assessment & Plan Note (Signed)
Her A1c is now 6.4 , with no fasting glucose above 121 .  I have recommended wt loss of 10% of body weight over the next 6 months using a low glycemic index diet and regular exercise a minimum of 5 days per week.   Lab Results  Component Value Date   HGBA1C 6.4 06/10/2014

## 2014-12-20 NOTE — Assessment & Plan Note (Signed)
Elevated today secondary to pain . Will reevaluate in one month and increase losartan if needed.   Lab Results  Component Value Date   CREATININE 0.76 06/10/2014   Lab Results  Component Value Date   NA 138 06/10/2014   K 5.0 06/10/2014   CL 100 06/10/2014   CO2 32 06/10/2014

## 2014-12-21 NOTE — Telephone Encounter (Signed)
Faxed short term disability and sent copy to scan.

## 2014-12-23 ENCOUNTER — Other Ambulatory Visit: Payer: Self-pay | Admitting: Internal Medicine

## 2014-12-23 ENCOUNTER — Telehealth: Payer: Self-pay | Admitting: *Deleted

## 2014-12-23 ENCOUNTER — Encounter: Payer: Self-pay | Admitting: Internal Medicine

## 2014-12-23 DIAGNOSIS — E538 Deficiency of other specified B group vitamins: Secondary | ICD-10-CM | POA: Insufficient documentation

## 2014-12-23 MED ORDER — CYANOCOBALAMIN 1000 MCG/ML IJ SOLN: 1000.0000 ug | Freq: Every day | INTRAMUSCULAR | Status: DC

## 2014-12-23 MED ORDER — SYRINGE (DISPOSABLE) 1 ML MISC: Status: DC

## 2014-12-23 NOTE — Telephone Encounter (Signed)
Pt called requesting lab results from 9.16.16.  Please advise

## 2014-12-31 ENCOUNTER — Telehealth: Payer: Self-pay | Admitting: Internal Medicine

## 2014-12-31 MED ORDER — PREGABALIN 100 MG PO CAPS: 100.0000 mg | ORAL_CAPSULE | Freq: Three times a day (TID) | ORAL | Status: DC

## 2014-12-31 NOTE — Telephone Encounter (Signed)
Please advise.  Patient was started on Lyrica 75mg  BID at her appt.

## 2014-12-31 NOTE — Telephone Encounter (Signed)
Dose increased to 100 mg tid and letter written

## 2014-12-31 NOTE — Telephone Encounter (Signed)
Pt called wanting to know if she can increase her Lyrica? Pt also wants to know if a note can be written for her to be out for the 2 weeks before seeing the pain doctor? Pt sees the pain doctor on 10/31. Thank You!

## 2015-01-01 NOTE — Telephone Encounter (Signed)
Spoke with patient.  Verbalized understanding.  Patient needs prescription sent to [place] and will stop by to pick up the letter.

## 2015-01-01 NOTE — Telephone Encounter (Signed)
Left message to return my call.  

## 2015-01-07 ENCOUNTER — Ambulatory Visit: Payer: 59 | Admitting: Internal Medicine

## 2015-01-15 ENCOUNTER — Other Ambulatory Visit: Payer: Self-pay | Admitting: Internal Medicine

## 2015-01-15 NOTE — Telephone Encounter (Signed)
Mailed unread message to patient.  

## 2015-01-22 ENCOUNTER — Telehealth: Payer: Self-pay | Admitting: *Deleted

## 2015-01-22 MED ORDER — PREGABALIN 150 MG PO CAPS: 150.0000 mg | ORAL_CAPSULE | Freq: Three times a day (TID) | ORAL | Status: DC

## 2015-01-22 NOTE — Telephone Encounter (Signed)
Pt left vm on Triage line, and came in the office during lunch, stating that she is having severe pain in her groin and back.  Pt states that she saw Dr Octavio Manns, he did a pelvic xray and set her up for bone scan and physical therapy.  Pt does not think that Lyrica is working, asking for an increase in the Lyrica.  Pt is scheduled to see you next week, 01/27/15.

## 2015-01-22 NOTE — Telephone Encounter (Signed)
Dose increased to 150 mg tid

## 2015-01-25 ENCOUNTER — Other Ambulatory Visit: Payer: Self-pay | Admitting: Orthopedic Surgery

## 2015-01-25 DIAGNOSIS — R102 Pelvic and perineal pain: Secondary | ICD-10-CM

## 2015-01-27 ENCOUNTER — Encounter: Payer: Self-pay | Admitting: Internal Medicine

## 2015-01-27 ENCOUNTER — Ambulatory Visit (INDEPENDENT_AMBULATORY_CARE_PROVIDER_SITE_OTHER): Payer: 59 | Admitting: Internal Medicine

## 2015-01-27 VITALS — BP 148/90 | HR 93 | Temp 98.4°F | Resp 12 | Ht 62.0 in | Wt 171.4 lb

## 2015-01-27 DIAGNOSIS — M4806 Spinal stenosis, lumbar region: Secondary | ICD-10-CM

## 2015-01-27 DIAGNOSIS — F3131 Bipolar disorder, current episode depressed, mild: Secondary | ICD-10-CM

## 2015-01-27 DIAGNOSIS — M797 Fibromyalgia: Secondary | ICD-10-CM

## 2015-01-27 DIAGNOSIS — M48061 Spinal stenosis, lumbar region without neurogenic claudication: Secondary | ICD-10-CM

## 2015-01-27 MED ORDER — MILNACIPRAN HCL 12.5 & 25 & 50 MG PO MISC: ORAL | Status: DC

## 2015-01-27 NOTE — Patient Instructions (Signed)
Trial of Savella for pain management and FM.  The dose is titrated up over the first week to 50 mg twice daily   I agree with extension of disability to Jan 31

## 2015-01-27 NOTE — Progress Notes (Signed)
Pre-visit discussion using our clinic review tool. No additional management support is needed unless otherwise documented below in the visit note.  

## 2015-01-27 NOTE — Progress Notes (Signed)
Subjective:  Patient ID: Katherine Jimenez, female    DOB: 06/09/1950  Age: 64 y.o. MRN: [id number]  CC: The primary encounter diagnosis was Degenerative lumbar spinal stenosis. Diagnoses of Fibromyalgia and Bipolar affective disorder, currently depressed, mild ([place]) were also pertinent to this visit.  HPI MANA HABERL presents for follow up on persistent low back pain secondary to spinal stenosis  noted at L1-:L3 by recent MRI .  Since her last visit she has been referred to pain clinic. Had a maximum of two weeks of relief of pain. With 2nd ESI.  She has developed new onset bilateral groin pain which started last week and was seeing KK for right knee pain due to OA ,  So the pelvis was x rayed .  Results not known.  Tried oxycodone offered by [facility],  Did not help. KK  has now ordered a bone scan , not done yet    Net appt with pain clinic is tomorrow.   Waking up at night with pain .  Using naproxen once daily 3 at time.   Has tried cymbalta,  No modification of pain   Still having episodes of diffuse body pain and flu like symptoms occurring intermittently. Past episodes had serologic  evaluation which were negative for signs of inflammation or autoimmune syndromes.  diagnosis of fibromyalgia dicussed.  Discussed trial of savella and 2nd opinion on surgery if pain managemnet not able to improve her pain .   Outpatient Prescriptions Prior to Visit  Medication Sig Dispense Refill  . acetaminophen (TYLENOL) 325 MG tablet Take 650 mg by mouth every 6 (six) hours as needed.    . cyanocobalamin (,VITAMIN B-12,) 1000 MCG/ML injection Inject 1 mL (1,000 mcg total) into the muscle daily. X 3, then weekly x 3, then monthly thereafter 10 mL 0  . glucose blood (FREESTYLE LITE) test strip Use once daily  as instructed to test blood sugar E 11.9 100 each 3  . lamoTRIgine (LAMICTAL) 200 MG tablet Take 200 mg by mouth 2 (two) times daily.    . Lancets (FREESTYLE) lancets Use once daily to check blood  sugars  E11.9 100 each 3  . losartan (COZAAR) 50 MG tablet TAKE 1 TABLET (50 MG TOTAL) BY MOUTH DAILY. 90 tablet 1  . Melatonin 5 MG TABS Take 2 tablets by mouth at bedtime and may repeat dose one time if needed.    . naproxen (NAPROSYN) 250 MG tablet Take 250 mg by mouth 2 (two) times daily with a meal.    . omeprazole (PRILOSEC) 40 MG capsule TAKE 1 CAPSULE BY MOUTH DAILY. 90 capsule 3  . pregabalin (LYRICA) 150 MG capsule Take 1 capsule (150 mg total) by mouth 3 (three) times daily. 90 capsule 1  . Syringe, Disposable, 1 ML MISC For use with B12 injections 25 each 3  . tiZANidine (ZANAFLEX) 4 MG tablet Take 1 tablet by mouth 3 (three) times daily.    Marland Kitchen ALPRAZolam (XANAX) 0.5 MG tablet Take 0.5 mg by mouth at bedtime as needed.    . gabapentin (NEURONTIN) 300 MG capsule 1 po tid     No facility-administered medications prior to visit.    Review of Systems;  Patient denies headache, fevers, malaise, unintentional weight loss, skin rash, eye pain, sinus congestion and sinus pain, sore throat, dysphagia,  hemoptysis , cough, dyspnea, wheezing, chest pain, palpitations, orthopnea, edema, abdominal pain, nausea, melena, diarrhea, constipation, flank pain, dysuria, hematuria, urinary  Frequency, nocturia, numbness, tingling, seizures,  Focal weakness, Loss of consciousness,  Tremor, insomnia, depression, anxiety, and suicidal ideation.      Objective:  BP 148/90 mmHg  Pulse 93  Temp(Src) 98.4 F (36.9 C) (Oral)  Resp 12  Ht 5\' 2"  (1.575 m)  Wt 171 lb 6 oz (77.735 kg)  BMI 31.34 kg/m2  SpO2 97%  BP Readings from Last 3 Encounters:  01/27/15 148/90  12/18/14 138/88  11/27/14 120/76    Wt Readings from Last 3 Encounters:  01/27/15 171 lb 6 oz (77.735 kg)  12/18/14 171 lb 6 oz (77.735 kg)  11/27/14 165 lb (74.844 kg)    General appearance: alert, cooperative and appears stated age Ears: normal TM's and external ear canals both ears Throat: lips, mucosa, and tongue normal; teeth  and gums normal Neck: no adenopathy, no carotid bruit, supple, symmetrical, trachea midline and thyroid not enlarged, symmetric, no tenderness/mass/nodules Back: symmetric, no curvature. ROM normal. No CVA tenderness. Lungs: clear to auscultation bilaterally Heart: regular rate and rhythm, S1, S2 normal, no murmur, click, rub or gallop Abdomen: soft, non-tender; bowel sounds normal; no masses,  no organomegaly Pulses: 2+ and symmetric Skin: Skin color, texture, turgor normal. No rashes or lesions Lymph nodes: Cervical, supraclavicular, and axillary nodes normal.  Lab Results  Component Value Date   HGBA1C 6.4 06/10/2014   HGBA1C 6.3* 08/22/2013   HGBA1C 6.1 07/22/2012    Lab Results  Component Value Date   CREATININE 0.76 06/10/2014   CREATININE 0.79 08/22/2013   CREATININE 0.73 07/22/2012    Lab Results  Component Value Date   WBC 5.9 11/27/2014   HGB 10.4* 11/27/2014   HCT 33.6* 11/27/2014   PLT 414 11/27/2014   GLUCOSE 102* 06/10/2014   CHOL 245* 08/31/2011   TRIG 74.0 08/31/2011   HDL 118.00 08/31/2011   LDLDIRECT 96.0 06/10/2014   ALT 16 11/27/2014   AST 25 11/27/2014   NA 138 06/10/2014   K 5.0 06/10/2014   CL 100 06/10/2014   CREATININE 0.76 06/10/2014   BUN 8 06/10/2014   CO2 32 06/10/2014   TSH 0.681 12/18/2014   INR 0.99 11/27/2014   HGBA1C 6.4 06/10/2014   MICROALBUR <0.7 06/10/2014    Ct Lumbar Spine W Contrast  11/30/2014  CLINICAL DATA:  Back pain. RIGHT-sided radicular symptoms. Scoliosis. EXAM: CT MYELOGRAPHY LUMBAR SPINE TECHNIQUE: CT imaging of the lumbar spine was performed after intrathecal contrast administration. Multiplanar CT image reconstructions were also generated. COMPARISON:  MRI lumbar spine 10/07/2014. FINDINGS: Radiologist performed the  injection and was dictated separately. Segmentation: Normal. Alignment: No retrolisthesis. Approximately 20 degrees of convex LEFT degenerative scoliosis related to asymmetric loss of interspace height  at L2-3 primarily but also at L1-2. Vertebrae: No worrisome osseous lesion. Conus medullaris: Normal in size, and location. Paraspinal tissues: No evidence for hydronephrosis or paravertebral mass. Disc levels: L1-L2: Rotoscoliosis convex LEFT due to asymmetric loss of interspace height on the RIGHT. RIGHT-sided spurring begins in the subarticular zone and extends into the foramen and beyond. RIGHT L1 and RIGHT L2 nerve root impingement are likely. L2-L3: Asymmetric loss of interspace height on the RIGHT is prominent. Asymmetric facet arthropathy and ligamentum flavum calcification also worse on the RIGHT. RIGHT subarticular zone foraminal zone narrowing affect the L2 and L3 nerve roots. L3-L4: Mild facet arthropathy. Slight extraforaminal spurring on the LEFT. Mild annular bulging. Slight LEFT-sided foraminal narrowing could affect the to L3 nerve root. L4-L5: Severe facet arthropathy with vacuum phenomenon. Slight annular bulging. No stenosis, significant subarticular zone, or foraminal  zone narrowing. L5-S1: Advanced facet arthropathy, worse on the LEFT. Small foraminal protrusion on the LEFT. No stenosis or subarticular zone narrowing. LEFT-sided foraminal narrowing likely affects the L5 nerve root. Compared with priors, good general agreement. IMPRESSION: RIGHT-sided symptomatology likely derives from neural impingement due to degenerative scoliosis at L1-2 and L2-3. See discussion above.  Good general agreement with prior MR. Electronically Signed   By: Staci Righter M.D.   On: 11/30/2014 08:30   Dg Myelogram Lumbar  11/27/2014  CLINICAL DATA:  Idiopathic scoliosis. EXAM: LUMBAR MYELOGRAM FLUOROSCOPY TIME:  41.8 mGy PROCEDURE: After thorough discussion of risks and benefits of the procedure including bleeding, infection, injury to nerves, blood vessels, adjacent structures as well as headache and CSF leak, written and oral informed consent was obtained. Consent was obtained by Dr. Kathreen Devoid. Time out form  was completed. Patient was positioned prone on the fluoroscopy table. Local anesthesia was provided with 1% lidocaine without epinephrine after prepped and draped in the usual sterile fashion. Puncture was performed at L4-5 using a 6 inch 22-gauge spinal needle via left paramedian approach. Using a single pass through the dura, the needle was placed within the thecal sac, with return of clear CSF. 10 mL of Isovue-300 M was injected into the thecal sac, with normal opacification of the nerve roots and cauda equina consistent with free flow within the subarachnoid space. I personally performed the lumbar puncture and administered the intrathecal contrast. I also personally supervised acquisition of the myelogram images. COMPARISON:  None IMPRESSION: Successful fluoroscopic guided lumbar spine myelogram prior to CT scan. 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 I am recommending a second surgical opinion and an extension of her disability for 3 more months.       Fibromyalgia    Suggested by exam and history,  Savella trial recommended but cost prohibitive,  Will increase Lyrica dose to maximum dose of 450 mg daily in divided doses       A total of 25 minutes of face to face time was spent with patient more than half of which was spent in counselling about the above mentioned conditions  and coordination of care   I have discontinued Ms. Stockert's ALPRAZolam and gabapentin. I am also having her  start on Milnacipran HCl. Additionally, I am having her maintain her lamoTRIgine, naproxen, acetaminophen, Melatonin, glucose blood, freestyle, tiZANidine, losartan, cyanocobalamin, Syringe (Disposable), omeprazole, and pregabalin.  Meds ordered this encounter  Medications  . Milnacipran HCl (SAVELLA TITRATION PACK) 12.5 & 25 & 50 MG MISC    Sig: 12.5 mg  Once  On Day 1,   twice daily on days 2-3 ; 25 mg tiwce daily Dasy 4-7.  50 mg twice daily thereafter    Dispense:  55 each    Refill:  0    Medications Discontinued During This Encounter  Medication Reason  . ALPRAZolam (XANAX) 0.5  MG tablet Patient Preference  . gabapentin (NEURONTIN) 300 MG capsule Change in therapy    Follow-up: Return in about 4 weeks (around 02/24/2015).   Crecencio Mc, MD

## 2015-01-28 ENCOUNTER — Telehealth: Payer: Self-pay | Admitting: Internal Medicine

## 2015-01-28 NOTE — Telephone Encounter (Signed)
Please clarify,  Because I sent 150 mg rx of Lyrica to pharmacy on Oct 21. She was taking 100 mg dose three times daily  previously .   Regarding the other medication, we should hold off until she tries the maximum dose of lyrica  Which is 150 mg three times daily

## 2015-01-28 NOTE — Telephone Encounter (Signed)
Pt needs a refill on 25mg  Lyrica to make the right dose need to be sent to the employee pharmacy.. Pt also stated that the   Milnacipran HCl (SAVELLA TITRATION PACK) 12.5 & 25 & 50 MG MISC is very expensive and wants to know if there is another medicine or can see just hold off.. Please advise pt

## 2015-01-28 NOTE — Telephone Encounter (Signed)
Clarified with patient, she thought she was only picking up a 25mg  script. She will return to get the 150mg .

## 2015-01-28 NOTE — Telephone Encounter (Signed)
Please advise 

## 2015-01-29 ENCOUNTER — Ambulatory Visit
Admission: RE | Admit: 2015-01-29 | Discharge: 2015-01-29 | Disposition: A | Discharge status: Home / Self Care | Payer: 59 | Source: Ambulatory Visit | Attending: Orthopedic Surgery | Admitting: Orthopedic Surgery

## 2015-01-29 DIAGNOSIS — R102 Pelvic and perineal pain: Secondary | ICD-10-CM | POA: Insufficient documentation

## 2015-01-29 MED ORDER — TECHNETIUM TC 99M MEDRONATE IV KIT
21.2320 | PACK | Freq: Once | INTRAVENOUS | Status: AC | PRN
  Administered 2015-01-29: 21.232 via INTRAVENOUS

## 2015-01-30 DIAGNOSIS — M797 Fibromyalgia: Secondary | ICD-10-CM | POA: Insufficient documentation

## 2015-01-30 NOTE — Assessment & Plan Note (Signed)
Patient requesting refill on lamictal until she can see her psychiatrist

## 2015-01-30 NOTE — Assessment & Plan Note (Signed)
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 I am recommending a second surgical opinion and an extension of her disability for 3 more months.

## 2015-01-30 NOTE — Assessment & Plan Note (Signed)
Suggested by exam and history,  Savella trial recommended but cost prohibitive,  Will increase Lyrica dose to maximum dose of 450 mg daily in divided doses

## 2015-03-01 ENCOUNTER — Ambulatory Visit: Payer: 59 | Admitting: Internal Medicine

## 2015-03-01 ENCOUNTER — Telehealth: Payer: Self-pay | Admitting: Internal Medicine

## 2015-03-01 ENCOUNTER — Ambulatory Visit (INDEPENDENT_AMBULATORY_CARE_PROVIDER_SITE_OTHER): Payer: 59 | Admitting: Nurse Practitioner

## 2015-03-01 VITALS — BP 114/72 | HR 87 | Temp 98.2°F | Resp 14 | Ht 63.5 in | Wt 172.4 lb

## 2015-03-01 DIAGNOSIS — R002 Palpitations: Secondary | ICD-10-CM

## 2015-03-01 LAB — TROPONIN I: TNIDX: 0.01 ug/l (ref 0.00–0.06)

## 2015-03-01 NOTE — Telephone Encounter (Signed)
FYI: Has appt today with Morey Hummingbird today @ 2:30.

## 2015-03-01 NOTE — Progress Notes (Signed)
Patient ID: Katherine Jimenez, female    DOB: [birth year]  Age: 64 y.o. MRN: [id number]  CC: Chest Pain   HPI Katherine Jimenez presents for team health call of Chest pain and right side jaw pain.   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 She is very aware of cardiac concerns and had a h/o angina at rest in 2013 with a normal cath. She has had no further concerns or cardiac workup since 2013.  Patient reports she was getting ready for church yesterday and was in the shower and experienced 10 minutes of right side jaw pain that was moderate in intensity and concerning to the patient. She is unsure if the chest pain happened at this exact same time or around the same time. Patient reports fast heartbeat that felt strong. She reported going to church, did not experience sweating or nausea. Patient felt concerned still and went to the EMS station. They obtained a EKG which showed left ventricular hypertrophy. She also had a elevated blood pressure that was approximately 190/110. She felt this was probably due to anxiety and was advised to go to the emergency room. She declined.  She went home and reportedly did not sleep very well last night. This morning she woke up feeling improved went to water aerobics for 1 hour. She experienced no angina during this time, rapid heart rate, jaw pain. She does report currently some shortness of breath and a "squeezing sensation of her heart".  Didn't take the [facility] due to cost she reports, this has side effects of palpitations and tachycardia.   History Katherine Jimenez has a past medical history of Hypertension; Treadmill stress test negative for angina pectoris (June 2013); Squamous cell skin cancer, face; Bunion, left; Bipolar disorder ([place]); and Diabetes mellitus.   She has past surgical history that includes Joint replacement (2012); Bladder augmentation; Knee arthroscopy; Vaginal delivery; Tubal ligation; and Total hip arthroplasty (2012).    Her family history includes Heart disease in her father, maternal grandfather, maternal grandmother, mother, paternal grandfather, and paternal grandmother; Stroke in her mother.She reports that she has never smoked. She has never used smokeless tobacco. She reports that she drinks alcohol. She reports that she does not use illicit drugs.  Outpatient Prescriptions Prior to Visit  Medication Sig Dispense Refill  . acetaminophen (TYLENOL) 325 MG tablet Take 650 mg by mouth every 6 (six) hours as needed.    . cyanocobalamin (,VITAMIN B-12,) 1000 MCG/ML injection Inject 1 mL (1,000 mcg total) into the muscle daily. X 3, then weekly x 3, then monthly thereafter 10 mL 0  . glucose blood (FREESTYLE LITE) test strip Use once daily  as instructed to test blood sugar E 11.9 100 each 3  . lamoTRIgine (LAMICTAL) 200 MG tablet Take 200 mg by mouth 2 (two) times daily.    . Lancets (FREESTYLE) lancets Use once daily to check blood sugars  E11.9 100 each 3  . losartan (COZAAR) 50 MG tablet TAKE 1 TABLET (50 MG TOTAL) BY MOUTH DAILY. 90 tablet 1  . Melatonin 5 MG TABS Take 2 tablets by mouth at bedtime and may repeat dose one time if needed.    . naproxen (NAPROSYN) 250 MG tablet Take 250 mg by mouth 2 (two) times daily with a meal.    . omeprazole (PRILOSEC) 40 MG capsule TAKE 1 CAPSULE BY MOUTH DAILY. 90 capsule 3  . pregabalin (LYRICA) 150 MG capsule Take 1 capsule (150 mg total) by  mouth 3 (three) times daily. 90 capsule 1  . Syringe, Disposable, 1 ML MISC For use with B12 injections 25 each 3  . Milnacipran HCl (SAVELLA TITRATION PACK) 12.5 & 25 & 50 MG MISC 12.5 mg  Once  On Day 1,   twice daily on days 2-3 ; 25 mg tiwce daily Dasy 4-7.  50 mg twice daily thereafter 55 each 0  . tiZANidine (ZANAFLEX) 4 MG tablet Take 1 tablet by mouth 3 (three) times daily.     No facility-administered medications prior to visit.    ROS Review of Systems  Constitutional: Negative for fever, chills, diaphoresis and  fatigue.  Respiratory: Positive for shortness of breath. Negative for chest tightness and wheezing.   Cardiovascular: Positive for chest pain and palpitations. Negative for leg swelling.  Gastrointestinal: Negative for nausea, vomiting, diarrhea and rectal pain.  Skin: Negative for rash.  Neurological: Negative for dizziness, weakness, numbness and headaches.  Psychiatric/Behavioral: The patient is nervous/anxious.     Objective:  BP 114/72 mmHg  Pulse 87  Temp(Src) 98.2 F (36.8 C)  Resp 14  Ht 5' 3.5" (1.613 m)  Wt 172 lb 6.4 oz (78.2 kg)  BMI 30.06 kg/m2  SpO2 97%  Physical Exam  Constitutional: She is oriented to person, place, and time. She appears well-developed and well-nourished. No distress.  HENT:  Head: Normocephalic and atraumatic.  Right Ear: External ear normal.  Left Ear: External ear normal.  Cardiovascular: Normal rate, regular rhythm and normal heart sounds.  Exam reveals no gallop and no friction rub.   No murmur heard. Pulmonary/Chest: Effort normal and breath sounds normal. No respiratory distress. She has no wheezes. She has no rales. She exhibits no tenderness.  Neurological: She is alert and oriented to person, place, and time. No cranial nerve deficit. She exhibits normal muscle tone. Coordination normal.  Skin: Skin is warm and dry. No rash noted. She is not diaphoretic.  Psychiatric: She has a normal mood and affect. Her behavior is normal. Judgment and thought content normal.   Assessment & Plan:   Katherine Jimenez was seen today for chest pain.  Diagnoses and all orders for this visit:  Palpitations -     EKG 12-Lead -     Troponin I   I have discontinued Ms. Morgano's Milnacipran HCl. I am also having her maintain her lamoTRIgine, naproxen, acetaminophen, Melatonin, glucose blood, freestyle, tiZANidine, losartan, cyanocobalamin, Syringe (Disposable), omeprazole, pregabalin, and aspirin EC.  Meds ordered this encounter  Medications  . aspirin EC 81 MG  tablet    Sig: Take 81 mg by mouth daily.     Follow-up: Return if symptoms worsen or fail to improve.

## 2015-03-01 NOTE — Patient Instructions (Signed)
No extra BP medication today. Please visit the lab today.

## 2015-03-01 NOTE — Progress Notes (Signed)
Pre visit review using our clinic review tool, if applicable. No additional management support is needed unless otherwise documented below in the visit note. 

## 2015-03-01 NOTE — Telephone Encounter (Signed)
Patient Name: Katherine Jimenez DOB: 07-23-1950 Initial Comment caller states she has chest tightness and heart palpitations Nurse Assessment Nurse: Ronnald Ramp, RN, Miranda Date/Time (Eastern Time): 03/01/2015 1:01:30 PM Confirm and document reason for call. If symptomatic, describe symptoms. ---Caller states yesterday she had jaw pain and chest tightness/ pain that lasted about 20 min. She went to EMS station and BP was high, they ran a EKG strip and told it was ok. Advised to go to ED but she did not go. Symptoms stopped. Today she has tightness and feels like her heart rate is fast and hard. HR 100. Has the patient traveled out of the country within the last 30 days? ---Not Applicable Does the patient have any new or worsening symptoms? ---Yes Will a triage be completed? ---Yes Related visit to physician within the last 2 weeks? ---No Does the PT have any chronic conditions? (i.e. diabetes, asthma, etc.) ---Yes List chronic conditions. ---Back problems, Fibromyalgia, HTN Is this a behavioral health call? ---No Guidelines Guideline Title Affirmed Question Affirmed Notes Heart Rate and Heartbeat Questions Age > 60 years (Exception: brief heart beat symptoms that went away and now feels well) Final Disposition User See Physician within 4 Hours (or PCP triage) Ronnald Ramp, RN, Miranda Comments Appt scheduled with Lorane Gell today at 2:30p Referrals REFERRED TO PCP OFFICE Disagree/Comply: Comply

## 2015-03-02 ENCOUNTER — Telehealth: Payer: Self-pay

## 2015-03-02 ENCOUNTER — Encounter: Payer: Self-pay | Admitting: Nurse Practitioner

## 2015-03-02 ENCOUNTER — Other Ambulatory Visit: Payer: Self-pay

## 2015-03-02 DIAGNOSIS — R002 Palpitations: Secondary | ICD-10-CM | POA: Insufficient documentation

## 2015-03-02 NOTE — Telephone Encounter (Signed)
Pt left a message stating she was having some tightess in her chest. When call back to address she has already been seen in the office.

## 2015-03-02 NOTE — Assessment & Plan Note (Addendum)
EKG 12-lead was obtained today. Similar findings to the EMS station's. New onset left ventricular hypertrophy. No ST changes since 2013. Patient reports her father had this. We discussed heart healthy diet, stress reduction, and will obtain a troponin stat today for peace of mind.   Patient has a self-made appointment with cardiology. She has not been seen since 2013 and reports she is starting over as a new patient. This was advised for her to keep the appointment. Asked her to let us know if we need to put in a referral for insurance purposes.   Asked her to seek emergency care if anything changes. Let us know if she has any other concerns or questions, but no further treatment at this time.  I have spent 27 minutes face-to-face with > 50% of time spent on obtaining a history, obtaining tests such as EKG and labs, counseling about concerns, and coming up with a treatment plan together.

## 2015-03-05 LAB — HM DIABETES EYE EXAM

## 2015-03-09 ENCOUNTER — Encounter: Payer: Self-pay | Admitting: Internal Medicine

## 2015-03-12 ENCOUNTER — Ambulatory Visit: Payer: 59 | Admitting: Cardiovascular Disease

## 2015-03-31 ENCOUNTER — Other Ambulatory Visit: Payer: Self-pay | Admitting: Internal Medicine

## 2015-04-01 ENCOUNTER — Ambulatory Visit: Payer: 59 | Admitting: Internal Medicine

## 2015-04-12 ENCOUNTER — Other Ambulatory Visit: Payer: Self-pay | Admitting: Internal Medicine

## 2015-04-22 ENCOUNTER — Encounter: Payer: Self-pay | Admitting: Internal Medicine

## 2015-04-22 ENCOUNTER — Ambulatory Visit (INDEPENDENT_AMBULATORY_CARE_PROVIDER_SITE_OTHER): Payer: 59 | Admitting: Internal Medicine

## 2015-04-22 VITALS — BP 120/82 | HR 103 | Temp 98.1°F | Resp 12 | Ht 64.0 in | Wt 170.0 lb

## 2015-04-22 DIAGNOSIS — E538 Deficiency of other specified B group vitamins: Secondary | ICD-10-CM

## 2015-04-22 DIAGNOSIS — M4806 Spinal stenosis, lumbar region: Secondary | ICD-10-CM

## 2015-04-22 DIAGNOSIS — E119 Type 2 diabetes mellitus without complications: Secondary | ICD-10-CM

## 2015-04-22 DIAGNOSIS — E663 Overweight: Secondary | ICD-10-CM

## 2015-04-22 DIAGNOSIS — I2089 Other forms of angina pectoris: Secondary | ICD-10-CM

## 2015-04-22 DIAGNOSIS — Z1239 Encounter for other screening for malignant neoplasm of breast: Secondary | ICD-10-CM | POA: Diagnosis not present

## 2015-04-22 DIAGNOSIS — Z23 Encounter for immunization: Secondary | ICD-10-CM

## 2015-04-22 DIAGNOSIS — I208 Other forms of angina pectoris: Secondary | ICD-10-CM

## 2015-04-22 DIAGNOSIS — M48061 Spinal stenosis, lumbar region without neurogenic claudication: Secondary | ICD-10-CM

## 2015-04-22 DIAGNOSIS — E785 Hyperlipidemia, unspecified: Secondary | ICD-10-CM | POA: Diagnosis not present

## 2015-04-22 LAB — LIPID PANEL
Cholesterol: 260 mg/dL — ABNORMAL HIGH (ref 0–200)
HDL: 103.6 mg/dL (ref >39.00)
LDL Cholesterol: 131 mg/dL — ABNORMAL HIGH (ref 0–99)
NonHDL: 156.71
Total CHOL/HDL Ratio: 3
Triglycerides: 131 mg/dL (ref 0.0–149.0)
VLDL: 26.2 mg/dL (ref 0.0–40.0)

## 2015-04-22 LAB — COMPREHENSIVE METABOLIC PANEL
ALT: 17 U/L (ref 0–35)
AST: 16 U/L (ref 0–37)
Albumin: 4.6 g/dL (ref 3.5–5.2)
Alkaline Phosphatase: 118 U/L — ABNORMAL HIGH (ref 39–117)
BUN: 13 mg/dL (ref 6–23)
CO2: 29 mEq/L (ref 19–32)
Calcium: 10.5 mg/dL (ref 8.4–10.5)
Chloride: 99 mEq/L (ref 96–112)
Creatinine, Ser: 0.86 mg/dL (ref 0.40–1.20)
GFR: 70.79 mL/min (ref >60.00)
Glucose, Bld: 117 mg/dL — ABNORMAL HIGH (ref 70–99)
Potassium: 4.8 mEq/L (ref 3.5–5.1)
Sodium: 139 mEq/L (ref 135–145)
Total Bilirubin: 0.4 mg/dL (ref 0.2–1.2)
Total Protein: 7.5 g/dL (ref 6.0–8.3)

## 2015-04-22 LAB — MICROALBUMIN / CREATININE URINE RATIO
Creatinine,U: 157.3 mg/dL
Microalb Creat Ratio: 0.5 mg/g (ref 0.0–30.0)
Microalb, Ur: 0.8 mg/dL (ref 0.0–1.9)

## 2015-04-22 LAB — LDL CHOLESTEROL, DIRECT: Direct LDL: 123 mg/dL

## 2015-04-22 LAB — HEMOGLOBIN A1C: Hgb A1c MFr Bld: 6.6 % — ABNORMAL HIGH (ref 4.6–6.5)

## 2015-04-22 MED ORDER — CYANOCOBALAMIN 1000 MCG/ML IJ SOLN: 1000.0000 ug | INTRAMUSCULAR | Status: DC

## 2015-04-22 NOTE — Progress Notes (Signed)
Pre-visit discussion using our clinic review tool. No additional management support is needed unless otherwise documented below in the visit note.  

## 2015-04-22 NOTE — Patient Instructions (Signed)
Your vitamin D was  borderline low back in September. Please  Start taking  2000 IUs daily  Of Vitamin D3 for the winter months, then reduce to 1000 Ius starting in April.   . Low Vitamin D can increase your risk of weak bones and fractures and interfere with your body's ability to absorb the calcium in your diet

## 2015-04-22 NOTE — Progress Notes (Signed)
Subjective:  Patient ID: Katherine Jimenez, female    DOB: 1950/07/21  Age: 65 y.o. MRN: [id number]  CC: The primary encounter diagnosis was Screening for breast cancer. Diagnoses of Diabetes mellitus without complication ([place]), Hyperlipidemia, Need for prophylactic vaccination against Streptococcus pneumoniae (pneumococcus), Overweight (BMI 25.0-29.9), Degenerative lumbar spinal stenosis, B12 deficiency, and Angina at rest [facility]) were also pertinent to this visit.Pat  HPI [employer] presents for follow up on multiple issuess:  1)  chronic back pain secondary to degenerative spinal stenosis involving the lumbar spine. Patient continues to have low back pain that radiates to both legs and is constant.  It is aggravated by stooping, bending over, and prolonged sitting or sitting.  She continues to prefer the avoidance of opioids for pain control and is taking naproxen, tizanidine . And Pregabalin on a chronic basis to make her pain tolerable.  She has seen a specialist and the insertion of a spinal cord stimulator ,but the cost of the procedure was denied by [facility],  And her neurosurgeon Dr Lovenia Shuck is in the process of appealing it . 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  She has been having trouble sleeping.    3) DM Type 2: Foot exam normal.  tolerating metformin  taking aspirin and losartan .  Following a low GI diet most days.  Exercise limited to water aerobics due to spinal stenosis and fibromyalgia.    Lab Results  Component Value Date   HGBA1C 6.6* 04/22/2015        Outpatient Prescriptions Prior to Visit  Medication Sig Dispense Refill  . acetaminophen (TYLENOL) 325 MG tablet Take 650 mg by mouth every 6 (six) hours as needed.    Marland Kitchen aspirin EC 81 MG tablet Take 81 mg by mouth daily.    Marland Kitchen glucose blood (FREESTYLE LITE) test strip Use once daily  as instructed to test blood sugar E 11.9 100 each 3  . lamoTRIgine (LAMICTAL) 200 MG tablet Take 200 mg by mouth 2 (two) times daily.    . Lancets (FREESTYLE) lancets Use once daily to check blood sugars  E11.9 100 each 3  . losartan (COZAAR) 50 MG tablet TAKE 1 TABLET (50 MG TOTAL) BY MOUTH DAILY. 90 tablet 1  . Melatonin 5 MG TABS Take 2 tablets by mouth at bedtime and may repeat dose one time if needed.    . metFORMIN (GLUCOPHAGE) 500 MG tablet TAKE 1 TABLET BY MOUTH 2 TIMES DAILY WITH A MEAL. 180 tablet 3  . naproxen (NAPROSYN) 250 MG tablet Take 250 mg by mouth 2 (two) times daily with a meal.    . omeprazole (PRILOSEC) 40 MG capsule TAKE 1 CAPSULE BY MOUTH DAILY. 90 capsule 3  . Syringe, Disposable, 1 ML MISC For use with B12 injections 25 each 3  . cyanocobalamin (,VITAMIN B-12,) 1000 MCG/ML injection Inject 1 mL (1,000 mcg total) into the muscle daily. X 3, then weekly x 3, then monthly thereafter 10 mL 0  .  pregabalin (LYRICA) 150 MG capsule Take 1 capsule (150 mg total) by mouth 3 (three) times daily. (Patient not taking: Reported on 04/22/2015) 90 capsule 1  . tiZANidine (ZANAFLEX) 4 MG tablet Take 1 tablet by mouth 3 (three) times daily. Reported on 04/22/2015     No facility-administered medications prior to visit.    Review of Systems;  Patient denies headache, fevers, malaise, unintentional weight loss, skin rash, eye pain, sinus congestion and sinus pain, sore throat, dysphagia,  hemoptysis , cough, dyspnea, wheezing, chest pain, palpitations, orthopnea, edema, abdominal pain, nausea, melena, diarrhea, constipation, flank pain, dysuria, hematuria, urinary  Frequency, nocturia, numbness,  tingling, seizures,  Focal weakness, Loss of consciousness,  Tremor, insomnia, depression, anxiety, and suicidal ideation.      Objective:  BP 120/82 mmHg  Pulse 103  Temp(Src) 98.1 F (36.7 C) (Oral)  Resp 12  Ht 5\' 4"  (1.626 m)  Wt 170 lb (77.111 kg)  BMI 29.17 kg/m2  SpO2 94%  BP Readings from Last 3 Encounters:  04/22/15 120/82  03/01/15 114/72  01/27/15 148/90    Wt Readings from Last 3 Encounters:  04/22/15 170 lb (77.111 kg)  03/01/15 172 lb 6.4 oz (78.2 kg)  01/27/15 171 lb 6 oz (77.735 kg)    General appearance: alert, cooperative and appears stated age Ears: normal TM's and external ear canals both ears Throat: lips, mucosa, and tongue normal; teeth and gums normal Neck: no adenopathy, no carotid bruit, supple, symmetrical, trachea midline and thyroid not enlarged, symmetric, no tenderness/mass/nodules Back: symmetric, no curvature. ROM normal. No CVA tenderness. Lungs: clear to auscultation bilaterally Heart: regular rate and rhythm, S1, S2 normal, no murmur, click, rub or gallop Abdomen: soft, non-tender; bowel sounds normal; no masses,  no organomegaly Pulses: 2+ and symmetric Skin: Skin color, texture, turgor normal. No rashes or lesions Lymph nodes: Cervical, supraclavicular, and axillary nodes normal.  Lab Results  Component Value Date   HGBA1C 6.6* 04/22/2015   HGBA1C 6.4 06/10/2014   HGBA1C 6.3* 08/22/2013    Lab Results  Component Value Date   CREATININE 0.86 04/22/2015   CREATININE 0.76 06/10/2014   CREATININE 0.79 08/22/2013    Lab Results  Component Value Date   WBC 5.9 11/27/2014   HGB 10.4* 11/27/2014   HCT 33.6* 11/27/2014   PLT 414 11/27/2014   GLUCOSE 117* 04/22/2015   CHOL 260* 04/22/2015   TRIG 131.0 04/22/2015   HDL 103.60 04/22/2015   LDLDIRECT 123.0 04/22/2015   LDLCALC 131* 04/22/2015   ALT 17 04/22/2015   AST 16 04/22/2015   NA 139 04/22/2015   K 4.8 04/22/2015   CL 99 04/22/2015   CREATININE 0.86 04/22/2015    BUN 13 04/22/2015   CO2 29 04/22/2015   TSH 0.681 12/18/2014   INR 0.99 11/27/2014   HGBA1C 6.6* 04/22/2015   MICROALBUR 0.8 04/22/2015    Nm Bone Scan Limited  01/29/2015  CLINICAL DATA:  Pelvic and perineal pain. EXAM: NUCLEAR MEDICINE BONE LIMITED TECHNIQUE: After intravenous administration of radiopharmaceutical, delayed planar images were obtained in multiple projections through the limited area of interest. RADIOPHARMACEUTICALS:  123XX123 millicurie technetium 50m MDP COMPARISON:  Lumbar myelogram 11/27/2014 FINDINGS: Degenerative changes are again noted in the lumbar spine, most notably in the left facet at L5-S1. This corresponds to the recent lumbar myelogram. SI joints demonstrate mild degenerative change bilaterally. No other focal uptake is present. Photopenic areas correspond with bilateral hip replacements. IMPRESSION: 1. Asymmetric degenerative change at L5-S1. 2. Mild degenerate changes within the  SI joints bilaterally. 3. No evidence for fracture. 4. Bilateral hip replacements. Electronically Signed   By: San Morelle M.D.   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 I am recommending extending her  disability for another 3 months until her neurosurgeon's plan can be put into practice.         Angina at rest [facility])   Hyperlipidemia     Based on current lipid profile, the risk of clinically significant CAD is 14% over the next 10 years, using the Framingham risk calculator. The [employer] recommends starting patients aged 40 or higher on  moderate intensity statin therapy for LDL between 70-189   Lab Results  Component Value Date   CHOL 260* 04/22/2015   HDL 103.60 04/22/2015   LDLCALC 131* 04/22/2015   LDLDIRECT 123.0 04/22/2015   TRIG 131.0 04/22/2015   CHOLHDL 3 04/22/2015         Relevant Orders   LDL cholesterol, direct (Completed)   Overweight (BMI 25.0-29.9)    Congratulated  Her in keeping her  BMI < 30 . I have addressed  BMI and recommended wt loss of 10% of body weigth over the next 6 months using a low glycemic index diet and regular exercise a minimum of 5 days per week.  I      Diabetes mellitus without complication ([place])    Her A1c is now 6.6  I have recommended stricter adherence to  a low glycemic index diet and regular exercise a minimum of 5 days per week.   Lab Results  Component Value Date   HGBA1C 6.6* 04/22/2015    Lab Results  Component Value Date   MICROALBUR 0.8 04/22/2015          Relevant Orders   Comprehensive metabolic panel (Completed)   Hemoglobin A1c (Completed)   Lipid panel (Completed)   Microalbumin / creatinine urine ratio (Completed)   B12 deficiency    Advised to resume B12 supplementation with IM or SL supplemens.   Lab Results  Component Value Date   VITAMINB12 195* 12/18/2014          Other Visit Diagnoses    Screening for breast cancer    -  Primary    Relevant Orders    MM DIGITAL SCREENING BILATERAL    Need for prophylactic vaccination against Streptococcus pneumoniae (pneumococcus)          A total of 40 minutes was spent with patient more than half of which was spent in counseling patient on the above mentioned issues , reviewing and explaining recent labs and imaging studies done, and coordination of care.   I have changed Ms. Dinse's cyanocobalamin. I am also having her maintain her lamoTRIgine, naproxen, acetaminophen, Melatonin, glucose blood, freestyle, tiZANidine, Syringe (Disposable), omeprazole, pregabalin, aspirin EC, metFORMIN,  losartan, and ALPRAZolam.  Meds ordered this encounter  Medications  . ALPRAZolam (XANAX) 0.5 MG tablet    Sig: Take 1 tablet by mouth at bedtime as needed.    Refill:  3  .  cyanocobalamin (,VITAMIN B-12,) 1000 MCG/ML injection    Sig: Inject 1 mL (1,000 mcg total) into the muscle once a week. X 3, then weekly x 3, then monthly thereafter    Dispense:  10 mL    Refill:  11    Medications Discontinued During This Encounter  Medication Reason  . cyanocobalamin (,VITAMIN B-12,) 1000 MCG/ML injection Reorder    Follow-up: No Follow-up on file.   Crecencio Mc, MD

## 2015-04-23 ENCOUNTER — Telehealth: Payer: Self-pay | Admitting: Internal Medicine

## 2015-04-23 NOTE — Telephone Encounter (Signed)
Called patient to pick up letter to extend [facility] and placed letter at front desk,

## 2015-04-25 ENCOUNTER — Encounter: Payer: Self-pay | Admitting: Internal Medicine

## 2015-04-25 NOTE — Assessment & Plan Note (Signed)
Congratulated  Her in keeping her  BMI < 30 . I have addressed  BMI and recommended wt loss of 10% of body weigth over the next 6 months using a low glycemic index diet and regular exercise a minimum of 5 days per week.  I

## 2015-04-25 NOTE — Assessment & Plan Note (Addendum)
  Based on current lipid profile, the risk of clinically significant CAD is 14% over the next 10 years, using the Framingham risk calculator. The [employer] recommends starting patients aged 64 or higher on moderate intensity statin therapy for LDL between 70-189   Lab Results  Component Value Date   CHOL 260* 04/22/2015   HDL 103.60 04/22/2015   LDLCALC 131* 04/22/2015   LDLDIRECT 123.0 04/22/2015   TRIG 131.0 04/22/2015   CHOLHDL 3 04/22/2015

## 2015-04-25 NOTE — Assessment & Plan Note (Signed)
Advised to resume B12 supplementation with IM or SL supplemens.   Lab Results  Component Value Date   VITAMINB12 195* 12/18/2014

## 2015-04-25 NOTE — Assessment & Plan Note (Signed)
Her A1c is now 6.6  I have recommended stricter adherence to  a low glycemic index diet and regular exercise a minimum of 5 days per week.   Lab Results  Component Value Date   HGBA1C 6.6* 04/22/2015    Lab Results  Component Value Date   MICROALBUR 0.8 04/22/2015

## 2015-04-25 NOTE — Assessment & Plan Note (Signed)
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 I am recommending extending her  disability for another 3 months until her neurosurgeon's plan can be put into practice.

## 2015-05-04 ENCOUNTER — Telehealth: Payer: Self-pay | Admitting: Internal Medicine

## 2015-05-04 MED ORDER — TRAZODONE HCL 50 MG PO TABS: 25.0000 mg | ORAL_TABLET | Freq: Every evening | ORAL | Status: DC | PRN

## 2015-05-04 NOTE — Telephone Encounter (Signed)
Notified pt of Dr. Lupita Dawn comments

## 2015-05-04 NOTE — Telephone Encounter (Signed)
Pt states that se has not been able to sleep for a couple weeks. Pt wants to know if you can prescribe something stronger. Pt relates anxiety issues to not being able to sleep. Pt has tried several over the counter meds such as meletonin, and tylenol pm. Please advise

## 2015-05-04 NOTE — Telephone Encounter (Signed)
Pt called about not sleeping at night. Pt states she had taken tylenol pm, melentonin and nothing is working for her. Pt would like for Dr Derrel Nip to call something in for her that will be compatible with her medications. Pharmacy is [facility], [address]. Call pt @ [phone]. Thank you!

## 2015-05-04 NOTE — Telephone Encounter (Signed)
Trazodone sent to pharmacy.  Non addicting non benzo.

## 2015-05-10 NOTE — Telephone Encounter (Signed)
Mailed unread message to patient.  

## 2015-05-11 ENCOUNTER — Telehealth: Payer: Self-pay | Admitting: Internal Medicine

## 2015-05-11 NOTE — Telephone Encounter (Signed)
Notified Katherine Jimenez that her labs were available on mychart, However I did read her Dr. [name] comments for her labs, the pt states that she will talk to her about her cholesterol when she comes in for appt

## 2015-05-11 NOTE — Telephone Encounter (Signed)
Pt called to check the status of her latest lab work? Call pt @ [phone]. Thank you!

## 2015-05-18 DIAGNOSIS — H2513 Age-related nuclear cataract, bilateral: Secondary | ICD-10-CM | POA: Diagnosis not present

## 2015-05-18 LAB — HM DIABETES EYE EXAM

## 2015-05-20 ENCOUNTER — Encounter: Payer: Self-pay | Admitting: Internal Medicine

## 2015-05-26 ENCOUNTER — Encounter: Payer: Self-pay | Admitting: Internal Medicine

## 2015-05-26 ENCOUNTER — Ambulatory Visit (INDEPENDENT_AMBULATORY_CARE_PROVIDER_SITE_OTHER): Payer: 59 | Admitting: Internal Medicine

## 2015-05-26 VITALS — BP 118/70 | HR 87 | Temp 98.2°F | Resp 12 | Ht 62.0 in | Wt 167.4 lb

## 2015-05-26 DIAGNOSIS — F5105 Insomnia due to other mental disorder: Secondary | ICD-10-CM

## 2015-05-26 DIAGNOSIS — M48061 Spinal stenosis, lumbar region without neurogenic claudication: Secondary | ICD-10-CM

## 2015-05-26 DIAGNOSIS — F341 Dysthymic disorder: Secondary | ICD-10-CM | POA: Diagnosis not present

## 2015-05-26 DIAGNOSIS — M4806 Spinal stenosis, lumbar region: Secondary | ICD-10-CM

## 2015-05-26 DIAGNOSIS — E785 Hyperlipidemia, unspecified: Secondary | ICD-10-CM

## 2015-05-26 DIAGNOSIS — F418 Other specified anxiety disorders: Secondary | ICD-10-CM

## 2015-05-26 MED ORDER — ALPRAZOLAM 0.5 MG PO TABS: 0.5000 mg | ORAL_TABLET | Freq: Every evening | ORAL | Status: DC | PRN

## 2015-05-26 NOTE — Progress Notes (Signed)
Pre visit review using our clinic review tool, if applicable. No additional management support is needed unless otherwise documented below in the visit note. 

## 2015-05-26 NOTE — Patient Instructions (Addendum)
I agree with not starting simvastatin.  Your HDL is very very high (that's good)   Return for Nonfasting Labs on or after April 19th

## 2015-05-26 NOTE — Progress Notes (Signed)
Subjective:  Patient ID: Katherine Jimenez, female    DOB: 1950/07/22  Age: 65 y.o. MRN: [id number]  CC: The primary encounter diagnosis was Degenerative lumbar spinal stenosis. Diagnoses of Insomnia secondary to depression with anxiety and Hyperlipidemia were also pertinent to this visit.  HPI [employer] presents for FOLLOW UP ON low back pain and insomnia  .  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   She has days where her pain is too severe to walk except when she is in the water .  The pain continues to radiate down right leg often,  Using lyrica 75 mg at night .   Still being evaluated by Neurosurgery for a spinal cord stimulator that Dr  Maryjean Ka has offered.  Marland Kitchen  [facility] will not pay .  She is sleeping better with use of trazodone.  Seeing a therapist at [facility]   Having cataract surgery  On her right eye  [place]   Outpatient Prescriptions Prior to Visit  Medication Sig Dispense Refill  . acetaminophen (TYLENOL) 325 MG tablet Take 650 mg by mouth every 6 (six) hours as needed.    Marland Kitchen aspirin EC 81 MG tablet Take 81 mg by mouth daily.    . cyanocobalamin (,VITAMIN B-12,) 1000 MCG/ML injection Inject 1 mL (1,000 mcg total) into the muscle once a week. X 3, then weekly x 3, then monthly thereafter 10 mL 11  . glucose blood (FREESTYLE LITE) test strip Use once daily  as instructed to test blood sugar E 11.9 100 each 3  . lamoTRIgine (LAMICTAL) 200 MG tablet Take 200 mg by mouth 2 (two) times daily.    . Lancets (FREESTYLE) lancets Use once daily to check blood sugars  E11.9 100 each 3  . losartan (COZAAR) 50 MG tablet TAKE 1 TABLET (50 MG TOTAL) BY MOUTH DAILY. 90 tablet 1  . metFORMIN (GLUCOPHAGE) 500 MG tablet TAKE 1 TABLET BY MOUTH 2 TIMES DAILY WITH A MEAL. 180 tablet 3  . naproxen (NAPROSYN) 250 MG tablet Take 250 mg by mouth 2 (two) times daily with a meal.    . omeprazole (PRILOSEC) 40 MG capsule TAKE 1 CAPSULE BY  MOUTH DAILY. 90 capsule 3  . pregabalin (LYRICA) 150 MG capsule Take 1 capsule (150 mg total) by mouth 3 (three) times daily. 90 capsule 1  . Syringe, Disposable, 1 ML MISC For use with B12 injections 25 each 3  . traZODone (DESYREL) 50 MG tablet Take 0.5-1 tablets (25-50 mg total) by mouth at bedtime as needed for sleep. 30 tablet 3  . ALPRAZolam (XANAX) 0.5 MG tablet Take 1 tablet by mouth at bedtime as needed.  3  . Melatonin 5 MG TABS Take 2 tablets by mouth at bedtime and may repeat dose one time if needed.    Marland Kitchen tiZANidine (ZANAFLEX) 4 MG tablet Take 1 tablet by mouth 3 (three) times daily. Reported on 04/22/2015     No facility-administered medications prior to visit.    Review of Systems;  Patient denies headache, fevers, malaise, unintentional weight loss, skin rash, eye pain, sinus congestion and sinus pain, sore throat, dysphagia,  hemoptysis , cough, dyspnea, wheezing, chest pain, palpitations, orthopnea, edema, abdominal pain, nausea, melena, diarrhea, constipation, flank pain, dysuria, hematuria, urinary  Frequency, nocturia, numbness, tingling, seizures,  Focal weakness, Loss of consciousness,  Tremor, insomnia, depression, anxiety, and suicidal ideation.      Objective:  BP 118/70  mmHg  Pulse 87  Temp(Src) 98.2 F (36.8 C) (Oral)  Resp 12  Ht 5\' 2"  (1.575 m)  Wt 167 lb 6.4 oz (75.932 kg)  BMI 30.61 kg/m2  SpO2 97%  BP Readings from Last 3 Encounters:  05/26/15 118/70  04/22/15 120/82  03/01/15 114/72    Wt Readings from Last 3 Encounters:  05/26/15 167 lb 6.4 oz (75.932 kg)  04/22/15 170 lb (77.111 kg)  03/01/15 172 lb 6.4 oz (78.2 kg)    General appearance: alert, cooperative and appears stated age Ears: normal TM's and external ear canals both ears Throat: lips, mucosa, and tongue normal; teeth and gums normal Neck: no adenopathy, no carotid bruit, supple, symmetrical, trachea midline and thyroid not enlarged, symmetric, no tenderness/mass/nodules Back:  symmetric, no curvature. ROM normal. No CVA tenderness. Lungs: clear to auscultation bilaterally Heart: regular rate and rhythm, S1, S2 normal, no murmur, click, rub or gallop Abdomen: soft, non-tender; bowel sounds normal; no masses,  no organomegaly Pulses: 2+ and symmetric Skin: Skin color, texture, turgor normal. No rashes or lesions Lymph nodes: Cervical, supraclavicular, and axillary nodes normal.  Lab Results  Component Value Date   HGBA1C 6.6* 04/22/2015   HGBA1C 6.4 06/10/2014   HGBA1C 6.3* 08/22/2013    Lab Results  Component Value Date   CREATININE 0.86 04/22/2015   CREATININE 0.76 06/10/2014   CREATININE 0.79 08/22/2013    Lab Results  Component Value Date   WBC 5.9 11/27/2014   HGB 10.4* 11/27/2014   HCT 33.6* 11/27/2014   PLT 414 11/27/2014   GLUCOSE 117* 04/22/2015   CHOL 260* 04/22/2015   TRIG 131.0 04/22/2015   HDL 103.60 04/22/2015   LDLDIRECT 123.0 04/22/2015   LDLCALC 131* 04/22/2015   ALT 17 04/22/2015   AST 16 04/22/2015   NA 139 04/22/2015   K 4.8 04/22/2015   CL 99 04/22/2015   CREATININE 0.86 04/22/2015   BUN 13 04/22/2015   CO2 29 04/22/2015   TSH 0.681 12/18/2014   INR 0.99 11/27/2014   HGBA1C 6.6* 04/22/2015   MICROALBUR 0.8 04/22/2015    Nm Bone Scan Limited  01/29/2015  CLINICAL DATA:  Pelvic and perineal pain. EXAM: NUCLEAR MEDICINE BONE LIMITED TECHNIQUE: After intravenous administration of radiopharmaceutical, delayed planar images were obtained in multiple projections through the limited area of interest. RADIOPHARMACEUTICALS:  123XX123 millicurie technetium 29m MDP COMPARISON:  Lumbar myelogram 11/27/2014 FINDINGS: Degenerative changes are again noted in the lumbar spine, most notably in the left facet at L5-S1. This corresponds to the recent lumbar myelogram. SI joints demonstrate mild degenerative change bilaterally. No other focal uptake is present. Photopenic areas correspond with bilateral hip replacements. IMPRESSION: 1.  Asymmetric degenerative change at L5-S1. 2. Mild degenerate changes within the SI joints bilaterally. 3. No evidence for fracture. 4. Bilateral hip replacements. Electronically Signed   By: San Morelle M.D.   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 I am recommending extending her  disability for another 3 months until her neurosurgeon's plan can be put  into practice.           Hyperlipidemia     Based on current lipid profile, the risk of clinically significant CAD is 14% over the next 10 years, using the Framingham risk calculator. The [employer] recommends starting patients aged 64 or higher on moderate intensity statin therapy for LDL between 70-189 .  She has deferred treatment for now as she want to lower her lipids with diet and exercise.   Lab Results  Component Value Date   CHOL 260* 04/22/2015   HDL 103.60 04/22/2015   LDLCALC 131* 04/22/2015   LDLDIRECT 123.0 04/22/2015   TRIG 131.0 04/22/2015   CHOLHDL 3 04/22/2015           Insomnia secondary to depression with anxiety    Improved with addition of trazodone/,  No changes today         A total of 25 minutes of face to face time was spent with patient more than half of which was spent in counselling about the above mentioned conditions  and coordination of care  I have discontinued Ms. Barcia's Melatonin and tiZANidine. I have also changed her ALPRAZolam. Additionally, I am having her maintain her lamoTRIgine, naproxen, acetaminophen, glucose blood, freestyle,  Syringe (Disposable), omeprazole, pregabalin, aspirin EC, metFORMIN, losartan, cyanocobalamin, and traZODone.  Meds ordered this encounter  Medications  . ALPRAZolam (XANAX) 0.5 MG tablet    Sig: Take 1 tablet (0.5 mg total) by mouth at bedtime as needed.    Dispense:  30 tablet    Refill:  2    Medications Discontinued During This Encounter  Medication Reason  . Melatonin 5 MG TABS Completed Course  . tiZANidine (ZANAFLEX) 4 MG tablet Completed Course  . ALPRAZolam (XANAX) 0.5 MG tablet Reorder    Follow-up: Return in about 2 months (around 07/24/2015), or 1C DUE ON OR AFTER APRIL 19TH OV TO FOLLOW .   Crecencio Mc, MD

## 2015-05-29 DIAGNOSIS — F418 Other specified anxiety disorders: Secondary | ICD-10-CM | POA: Insufficient documentation

## 2015-05-29 DIAGNOSIS — F5105 Insomnia due to other mental disorder: Secondary | ICD-10-CM | POA: Insufficient documentation

## 2015-05-29 NOTE — Assessment & Plan Note (Signed)
Improved with addition of trazodone/,  No changes today

## 2015-05-29 NOTE — Assessment & Plan Note (Signed)
  Based on current lipid profile, the risk of clinically significant CAD is 14% over the next 10 years, using the Framingham risk calculator. The [employer] recommends starting patients aged 64 or higher on moderate intensity statin therapy for LDL between 70-189 .  She has deferred treatment for now as she want to lower her lipids with diet and exercise.   Lab Results  Component Value Date   CHOL 260* 04/22/2015   HDL 103.60 04/22/2015   LDLCALC 131* 04/22/2015   LDLDIRECT 123.0 04/22/2015   TRIG 131.0 04/22/2015   CHOLHDL 3 04/22/2015

## 2015-05-29 NOTE — Assessment & Plan Note (Signed)
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 I am recommending extending her  disability for another 3 months until her neurosurgeon's plan can be put into practice.

## 2015-06-04 ENCOUNTER — Telehealth: Payer: Self-pay | Admitting: Internal Medicine

## 2015-06-04 MED ORDER — PREDNISONE 10 MG PO TABS: ORAL_TABLET | ORAL | Status: DC

## 2015-06-04 NOTE — Telephone Encounter (Signed)
Pt c/o worsening back and hip pain, pt is requesting a steroid pack be called in, pt was seen as recently as of 2/17 for similar issues. Please advise if she needs to be re evaluated, thanks

## 2015-06-04 NOTE — Telephone Encounter (Signed)
OV not needed; prednisone sent:  Take  6 tablets daily for 3 days,   Then taper by 1 tablet daily until gone

## 2015-06-04 NOTE — Telephone Encounter (Signed)
Pt would like to know if a steroid pack can be called in for her lower back and hip pain and it's getting worse. Pharmacy is [facility], [address]. Call pt @ [phone]. Thank you!

## 2015-06-04 NOTE — Telephone Encounter (Signed)
Notified pt of Dr. Corlis Hove comments, pt verbalized understanding and appreciation.

## 2015-06-10 ENCOUNTER — Telehealth: Payer: Self-pay | Admitting: Internal Medicine

## 2015-06-10 NOTE — Telephone Encounter (Signed)
Pt called needing to want to talk to Dr Derrel Nip about pt work situation regarding work note. Call pt @ [phone]. Thank you!

## 2015-06-11 NOTE — Telephone Encounter (Signed)
Noted  

## 2015-06-11 NOTE — Telephone Encounter (Signed)
I left a message on her voice mail that if she needed any documentation etc from me to let me know

## 2015-06-11 NOTE — Telephone Encounter (Signed)
FYI:Pt states that [facility] will be letting her go in 30 days but to her inability to work.

## 2015-06-14 ENCOUNTER — Telehealth: Payer: Self-pay | Admitting: *Deleted

## 2015-06-14 NOTE — Telephone Encounter (Signed)
FYI :Patient stated that the work note was okay and she still will pursue the spinal cord stimulator.

## 2015-06-14 NOTE — Telephone Encounter (Signed)
FYI, Please let me know if I need to assist with this. thanks

## 2015-06-17 DIAGNOSIS — Z79899 Other long term (current) drug therapy: Secondary | ICD-10-CM | POA: Diagnosis not present

## 2015-06-17 DIAGNOSIS — B359 Dermatophytosis, unspecified: Secondary | ICD-10-CM | POA: Diagnosis not present

## 2015-07-08 DIAGNOSIS — M1711 Unilateral primary osteoarthritis, right knee: Secondary | ICD-10-CM | POA: Diagnosis not present

## 2015-07-08 DIAGNOSIS — M25561 Pain in right knee: Secondary | ICD-10-CM | POA: Diagnosis not present

## 2015-07-09 DIAGNOSIS — H2513 Age-related nuclear cataract, bilateral: Secondary | ICD-10-CM | POA: Diagnosis not present

## 2015-07-12 ENCOUNTER — Encounter: Payer: Self-pay | Admitting: *Deleted

## 2015-07-15 DIAGNOSIS — M545 Low back pain: Secondary | ICD-10-CM | POA: Diagnosis not present

## 2015-07-15 DIAGNOSIS — M5416 Radiculopathy, lumbar region: Secondary | ICD-10-CM | POA: Diagnosis not present

## 2015-07-15 DIAGNOSIS — M4125 Other idiopathic scoliosis, thoracolumbar region: Secondary | ICD-10-CM | POA: Diagnosis not present

## 2015-07-15 NOTE — Discharge Instructions (Signed)

## 2015-07-19 ENCOUNTER — Ambulatory Visit
Admission: RE | Admit: 2015-07-19 | Discharge: 2015-07-19 | Disposition: A | Discharge status: Home / Self Care | Payer: 59 | Source: Ambulatory Visit | Attending: Ophthalmology | Admitting: Ophthalmology

## 2015-07-19 ENCOUNTER — Ambulatory Visit: Payer: 59 | Admitting: Anesthesiology

## 2015-07-19 ENCOUNTER — Encounter
Admission: RE | Disposition: A | Discharge status: Home / Self Care | Payer: Self-pay | Source: Ambulatory Visit | Attending: Ophthalmology

## 2015-07-19 DIAGNOSIS — F329 Major depressive disorder, single episode, unspecified: Secondary | ICD-10-CM | POA: Diagnosis not present

## 2015-07-19 DIAGNOSIS — H2511 Age-related nuclear cataract, right eye: Secondary | ICD-10-CM | POA: Diagnosis not present

## 2015-07-19 DIAGNOSIS — K219 Gastro-esophageal reflux disease without esophagitis: Secondary | ICD-10-CM | POA: Diagnosis not present

## 2015-07-19 DIAGNOSIS — I10 Essential (primary) hypertension: Secondary | ICD-10-CM | POA: Diagnosis not present

## 2015-07-19 DIAGNOSIS — Z888 Allergy status to other drugs, medicaments and biological substances status: Secondary | ICD-10-CM | POA: Diagnosis not present

## 2015-07-19 DIAGNOSIS — M1990 Unspecified osteoarthritis, unspecified site: Secondary | ICD-10-CM | POA: Diagnosis not present

## 2015-07-19 DIAGNOSIS — Z85828 Personal history of other malignant neoplasm of skin: Secondary | ICD-10-CM | POA: Diagnosis not present

## 2015-07-19 DIAGNOSIS — I25119 Atherosclerotic heart disease of native coronary artery with unspecified angina pectoris: Secondary | ICD-10-CM | POA: Insufficient documentation

## 2015-07-19 DIAGNOSIS — E119 Type 2 diabetes mellitus without complications: Secondary | ICD-10-CM | POA: Insufficient documentation

## 2015-07-19 DIAGNOSIS — H2513 Age-related nuclear cataract, bilateral: Secondary | ICD-10-CM | POA: Diagnosis not present

## 2015-07-19 DIAGNOSIS — Z96643 Presence of artificial hip joint, bilateral: Secondary | ICD-10-CM | POA: Diagnosis not present

## 2015-07-19 DIAGNOSIS — F419 Anxiety disorder, unspecified: Secondary | ICD-10-CM | POA: Diagnosis not present

## 2015-07-19 HISTORY — DX: Unspecified osteoarthritis, unspecified site: M19.90

## 2015-07-19 HISTORY — PX: CATARACT EXTRACTION W/PHACO: SHX586

## 2015-07-19 HISTORY — DX: Low back pain, unspecified: M54.50

## 2015-07-19 HISTORY — DX: Low back pain: M54.5

## 2015-07-19 LAB — GLUCOSE, CAPILLARY
Glucose-Capillary: 136 mg/dL — ABNORMAL HIGH (ref 65–99)
Glucose-Capillary: 128 mg/dL — ABNORMAL HIGH (ref 65–99)

## 2015-07-19 SURGERY — PHACOEMULSIFICATION, CATARACT, WITH IOL INSERTION
Anesthesia: Monitor Anesthesia Care | Laterality: Right | Wound class: Clean

## 2015-07-19 MED ORDER — ARMC OPHTHALMIC DILATING GEL
1.0000 "application " | OPHTHALMIC | Status: DC | PRN
  Administered 2015-07-19 (×2): 1 via OPHTHALMIC

## 2015-07-19 MED ORDER — CEFUROXIME OPHTHALMIC INJECTION 1 MG/0.1 ML
INJECTION | OPHTHALMIC | Status: DC | PRN
  Administered 2015-07-19: 0.1 mL via INTRACAMERAL

## 2015-07-19 MED ORDER — TETRACAINE HCL 0.5 % OP SOLN
1.0000 [drp] | OPHTHALMIC | Status: DC | PRN
  Administered 2015-07-19: 1 [drp] via OPHTHALMIC

## 2015-07-19 MED ORDER — MIDAZOLAM HCL 2 MG/2ML IJ SOLN
INTRAMUSCULAR | Status: DC | PRN
  Administered 2015-07-19: 2 mg via INTRAVENOUS

## 2015-07-19 MED ORDER — FENTANYL CITRATE (PF) 100 MCG/2ML IJ SOLN
25.0000 ug | INTRAMUSCULAR | Status: DC | PRN
  Administered 2015-07-19: 50 ug via INTRAVENOUS

## 2015-07-19 MED ORDER — OXYCODONE HCL 5 MG/5ML PO SOLN: 5.0000 mg | Freq: Once | ORAL | Status: AC | PRN

## 2015-07-19 MED ORDER — EPINEPHRINE HCL 1 MG/ML IJ SOLN
INTRAOCULAR | Status: DC | PRN
  Administered 2015-07-19: 110 mL via OPHTHALMIC
  Administered 2015-07-19: 09:00:00 via OPHTHALMIC

## 2015-07-19 MED ORDER — LIDOCAINE HCL (PF) 4 % IJ SOLN
INTRAOCULAR | Status: DC | PRN
  Administered 2015-07-19: 1 mL via OPHTHALMIC

## 2015-07-19 MED ORDER — TIMOLOL MALEATE 0.5 % OP SOLN
OPHTHALMIC | Status: DC | PRN
  Administered 2015-07-19: 1 [drp] via OPHTHALMIC

## 2015-07-19 MED ORDER — NA HYALUR & NA CHOND-NA HYALUR 0.4-0.35 ML IO KIT
PACK | INTRAOCULAR | Status: DC | PRN
  Administered 2015-07-19: 1 mL via INTRAOCULAR

## 2015-07-19 MED ORDER — OXYCODONE HCL 5 MG PO TABS
5.0000 mg | ORAL_TABLET | Freq: Once | ORAL | Status: AC | PRN
  Administered 2015-07-19: 5 mg via ORAL

## 2015-07-19 MED ORDER — LACTATED RINGERS IV SOLN: INTRAVENOUS | Status: DC

## 2015-07-19 MED ORDER — POVIDONE-IODINE 5 % OP SOLN
1.0000 "application " | OPHTHALMIC | Status: DC | PRN
  Administered 2015-07-19: 1 via OPHTHALMIC

## 2015-07-19 MED ORDER — BRIMONIDINE TARTRATE 0.2 % OP SOLN
OPHTHALMIC | Status: DC | PRN
  Administered 2015-07-19: 1 [drp] via OPHTHALMIC

## 2015-07-19 SURGICAL SUPPLY — 28 items
APPLICATOR COTTON TIP 3IN (MISCELLANEOUS) ×2 IMPLANT
CANNULA ANT/CHMB 27GA (MISCELLANEOUS) ×2 IMPLANT
DISSECTOR HYDRO NUCLEUS 50X22 (MISCELLANEOUS) ×2 IMPLANT
GLOVE BIO SURGEON STRL SZ7 (GLOVE) ×2 IMPLANT
GLOVE SURG LX 6.5 MICRO (GLOVE) ×1
GLOVE SURG LX STRL 6.5 MICRO (GLOVE) ×1 IMPLANT
GOWN STRL REUS W/ TWL LRG LVL3 (GOWN DISPOSABLE) ×2 IMPLANT
GOWN STRL REUS W/TWL LRG LVL3 (GOWN DISPOSABLE) ×4
LENS IOL ACRYSOF IQ 22.5 (Intraocular Lens) ×2 IMPLANT
MARKER SKIN DUAL TIP RULER LAB (MISCELLANEOUS) ×2 IMPLANT
NEEDLE FILTER BLUNT 18X 1/2SAF (NEEDLE) ×1
NEEDLE FILTER BLUNT 18X1 1/2 (NEEDLE) ×1 IMPLANT
PACK CATARACT BRASINGTON (MISCELLANEOUS) ×2 IMPLANT
PACK EYE AFTER SURG (MISCELLANEOUS) ×2 IMPLANT
PACK OPTHALMIC (MISCELLANEOUS) ×2 IMPLANT
RING MALYGIN 7.0 (MISCELLANEOUS) IMPLANT
SOL BAL SALT 15ML (MISCELLANEOUS)
SOLUTION BAL SALT 15ML (MISCELLANEOUS) IMPLANT
SUT ETHILON 10-0 CS-B-6CS-B-6 (SUTURE)
SUT VICRYL  9 0 (SUTURE)
SUT VICRYL 9 0 (SUTURE) IMPLANT
SUTURE EHLN 10-0 CS-B-6CS-B-6 (SUTURE) IMPLANT
SYR 3ML LL SCALE MARK (SYRINGE) ×2 IMPLANT
SYR TB 1ML LUER SLIP (SYRINGE) ×2 IMPLANT
WATER STERILE IRR 250ML POUR (IV SOLUTION) ×2 IMPLANT
WATER STERILE IRR 500ML POUR (IV SOLUTION) IMPLANT
WICK EYE OCUCEL (MISCELLANEOUS) IMPLANT
WIPE NON LINTING 3.25X3.25 (MISCELLANEOUS) ×2 IMPLANT

## 2015-07-19 NOTE — Anesthesia Preprocedure Evaluation (Signed)
Anesthesia Evaluation    Airway Mallampati: II  TM Distance: >3 FB Neck ROM: Full    Dental no notable dental hx.    Pulmonary    Pulmonary exam normal breath sounds clear to auscultation       Cardiovascular hypertension, + angina + CAD  Normal cardiovascular exam Rhythm:Regular Rate:Normal  No current symptoms. Mets 4+.  Can lay flat   Neuro/Psych    GI/Hepatic   Endo/Other  diabetes  Renal/GU      Musculoskeletal   Abdominal   Peds  Hematology   Anesthesia Other Findings   Reproductive/Obstetrics                             Anesthesia Physical Anesthesia Plan  ASA: III  Anesthesia Plan: General and MAC   Post-op Pain Management:    Induction: Intravenous  Airway Management Planned:   Additional Equipment:   Intra-op Plan:   Post-operative Plan: Extubation in OR  Informed Consent: I have reviewed the patients History and Physical, chart, labs and discussed the procedure including the risks, benefits and alternatives for the proposed anesthesia with the patient or authorized representative who has indicated his/her understanding and acceptance.   Dental advisory given  Plan Discussed with: CRNA  Anesthesia Plan Comments:         Anesthesia Quick Evaluation

## 2015-07-19 NOTE — Transfer of Care (Signed)
Immediate Anesthesia Transfer of Care Note  Patient: Katherine Jimenez  Procedure(s) Performed: Procedure(s) with comments: CATARACT EXTRACTION PHACO AND INTRAOCULAR LENS PLACEMENT (IOC) right eye (Right) - BORDERLINE DIABETIC - oral meds  Patient Location: PACU  Anesthesia Type: General, MAC  Level of Consciousness: awake, alert  and patient cooperative  Airway and Oxygen Therapy: Patient Spontanous Breathing and Patient connected to supplemental oxygen  Post-op Assessment: Post-op Vital signs reviewed, Patient's Cardiovascular Status Stable, Respiratory Function Stable, Patent Airway and No signs of Nausea or vomiting  Post-op Vital Signs: Reviewed and stable  Complications: No apparent anesthesia complications

## 2015-07-19 NOTE — Anesthesia Postprocedure Evaluation (Signed)
Anesthesia Post Note  Patient: Katherine Jimenez  Procedure(s) Performed: Procedure(s) (LRB): CATARACT EXTRACTION PHACO AND INTRAOCULAR LENS PLACEMENT (IOC) right eye (Right)  Patient location during evaluation: PACU Anesthesia Type: MAC Level of consciousness: awake and alert Pain management: pain level controlled Vital Signs Assessment: post-procedure vital signs reviewed and stable Respiratory status: spontaneous breathing, nonlabored ventilation, respiratory function stable and patient connected to nasal cannula oxygen Cardiovascular status: stable and blood pressure returned to baseline Anesthetic complications: no    Waynetta Metheny C

## 2015-07-19 NOTE — Anesthesia Procedure Notes (Signed)
Procedure Name: MAC Performed by: Rionna Feltes Pre-anesthesia Checklist: Patient identified, Emergency Drugs available, Suction available, Timeout performed and Patient being monitored Patient Re-evaluated:Patient Re-evaluated prior to inductionOxygen Delivery Method: Nasal cannula Placement Confirmation: positive ETCO2     

## 2015-07-19 NOTE — H&P (Signed)
H+P reviewed and is up to date, please see paper chart.  

## 2015-07-19 NOTE — Op Note (Signed)
Date of Surgery: 07/19/2015  PREOPERATIVE DIAGNOSES: Visually significant nuclear sclerotic cataract, right eye.  POSTOPERATIVE DIAGNOSES: Same  PROCEDURES PERFORMED: Cataract extraction with intraocular lens implant, right eye.  SURGEON: Almon Hercules, M.D.  ANESTHESIA: MAC and topical  IMPLANTS: [id number] +22.5D   Implant Name Type Inv. Item Serial No. Manufacturer Lot No. LRB No. Used  [id number] PRE LOADED IOL     [id number] ALCON   Right 1     COMPLICATIONS: None.  DESCRIPTION OF PROCEDURE: Therapeutic options were discussed with the patient preoperatively, including a discussion of risks and benefits of surgery. Informed consent was obtained. An IOL-Master and immersion biometry were used to take the lens measurements, and a dilated fundus exam was performed within 6 months of the surgical date.  The patient was premedicated and brought to the operating room and placed on the operating table in the supine position. After adequate anesthesia, the patient was prepped and draped in the usual sterile ophthalmic fashion. A wire lid speculum was inserted and the microscope was positioned. A Superblade was used to create a paracentesis site at the limbus and a small amount of dilute preservative free lidocaine was instilled into the anterior chamber, followed by dispersive viscoelastic. A clear corneal incision was created temporally using a 2.4 mm keratome blade. Capsulorrhexis was then performed. In situ phacoemulsification was performed.  Cortical material was removed with the irrigation-aspiration unit. Dispersive viscoelastic was instilled to open the capsular bag. A posterior chamber intraocular lens with the specifications above was inserted and positioned. Irrigation-aspiration was used to remove all viscoelastic. Cefuroxime 1cc was instilled into the anterior chamber, and the corneal incision was checked and found to be water tight. The eyelid speculum was removed.  The operative eye  was covered with protective goggles after instilling 1 drop of timolol and brimonidine. The patient tolerated the procedure well. There were no complications.

## 2015-07-20 ENCOUNTER — Encounter: Payer: Self-pay | Admitting: Ophthalmology

## 2015-07-26 ENCOUNTER — Other Ambulatory Visit: Payer: Self-pay

## 2015-07-26 MED ORDER — LAMOTRIGINE 200 MG PO TABS: 200.0000 mg | ORAL_TABLET | Freq: Two times a day (BID) | ORAL | Status: DC

## 2015-07-29 ENCOUNTER — Ambulatory Visit: Payer: 59 | Admitting: Internal Medicine

## 2015-08-02 ENCOUNTER — Ambulatory Visit: Payer: 59 | Admitting: Internal Medicine

## 2015-08-05 ENCOUNTER — Telehealth: Payer: Self-pay | Admitting: Internal Medicine

## 2015-08-05 DIAGNOSIS — M48061 Spinal stenosis, lumbar region without neurogenic claudication: Secondary | ICD-10-CM

## 2015-08-05 NOTE — Telephone Encounter (Signed)
I have been working on her long term disability application.  Katherine Jimenez's claim for long term disability requires information about range of motion and strength testing that is not documented in my notes, because she has  been seeing Katherine Jimenez.  In reviewing his notes  From September in October he documents normal strength in all extremities,  Bu not the kind of detail that they are asking for .  He also states that he will not support a claim for disability because her pain has been present since age 76.  I cannot print his notes from EP[employer] portal.   I do not have notes from Katherine Jimenez.  Those are needed as well   Once we get them both   We can send those in along with the most recent MRI.  I think she will need a formal PT evaluation for the rest, so I am ordering it now

## 2015-08-10 NOTE — Telephone Encounter (Signed)
Notified patient that we need medical release signed for records from Dr. Sharlet Salina and Dr. Arnoldo Morale and notified patient of PT referral. Placed disability form in [facility] hospital follow up folder.

## 2015-08-11 NOTE — Telephone Encounter (Signed)
Awaiting records from Dr, Sharlet Salina and Dr. Arnoldo Morale

## 2015-08-13 DIAGNOSIS — M1711 Unilateral primary osteoarthritis, right knee: Secondary | ICD-10-CM | POA: Diagnosis not present

## 2015-08-13 DIAGNOSIS — M25561 Pain in right knee: Secondary | ICD-10-CM | POA: Diagnosis not present

## 2015-08-17 NOTE — Telephone Encounter (Signed)
Records received and faxed to [facility] as requested. , notes from all mentioned MD with copy of MRI report. Patient notified that records have been faxed. Just FYI

## 2015-08-20 DIAGNOSIS — B351 Tinea unguium: Secondary | ICD-10-CM | POA: Diagnosis not present

## 2015-08-20 DIAGNOSIS — B353 Tinea pedis: Secondary | ICD-10-CM | POA: Diagnosis not present

## 2015-08-20 DIAGNOSIS — L578 Other skin changes due to chronic exposure to nonionizing radiation: Secondary | ICD-10-CM | POA: Diagnosis not present

## 2015-08-20 DIAGNOSIS — L603 Nail dystrophy: Secondary | ICD-10-CM | POA: Diagnosis not present

## 2015-08-23 ENCOUNTER — Ambulatory Visit
Admission: EM | Admit: 2015-08-23 | Discharge: 2015-08-23 | Disposition: A | Discharge status: Home / Self Care | Payer: 59 | Attending: Family Medicine | Admitting: Family Medicine

## 2015-08-23 ENCOUNTER — Encounter: Payer: Self-pay | Admitting: *Deleted

## 2015-08-23 ENCOUNTER — Telehealth: Payer: Self-pay | Admitting: Internal Medicine

## 2015-08-23 ENCOUNTER — Ambulatory Visit (INDEPENDENT_AMBULATORY_CARE_PROVIDER_SITE_OTHER): Payer: 59

## 2015-08-23 ENCOUNTER — Telehealth: Payer: Self-pay

## 2015-08-23 DIAGNOSIS — M4806 Spinal stenosis, lumbar region: Secondary | ICD-10-CM | POA: Diagnosis not present

## 2015-08-23 DIAGNOSIS — F5105 Insomnia due to other mental disorder: Secondary | ICD-10-CM

## 2015-08-23 DIAGNOSIS — M533 Sacrococcygeal disorders, not elsewhere classified: Secondary | ICD-10-CM

## 2015-08-23 DIAGNOSIS — M62830 Muscle spasm of back: Secondary | ICD-10-CM

## 2015-08-23 DIAGNOSIS — F341 Dysthymic disorder: Secondary | ICD-10-CM

## 2015-08-23 DIAGNOSIS — E785 Hyperlipidemia, unspecified: Secondary | ICD-10-CM

## 2015-08-23 DIAGNOSIS — R296 Repeated falls: Secondary | ICD-10-CM | POA: Diagnosis not present

## 2015-08-23 LAB — URINALYSIS COMPLETE WITH MICROSCOPIC (ARMC ONLY)
Bacteria, UA: NONE SEEN
Bilirubin Urine: NEGATIVE
Glucose, UA: NEGATIVE mg/dL
Hgb urine dipstick: NEGATIVE
Ketones, ur: NEGATIVE mg/dL
Leukocytes, UA: NEGATIVE
Nitrite: NEGATIVE
Protein, ur: NEGATIVE mg/dL
RBC / HPF: NONE SEEN RBC/hpf (ref 0–5)
Specific Gravity, Urine: 1.015 (ref 1.005–1.030)
WBC, UA: NONE SEEN WBC/hpf (ref 0–5)
pH: 7.5 (ref 5.0–8.0)

## 2015-08-23 MED ORDER — ORPHENADRINE CITRATE ER 100 MG PO TB12: 100.0000 mg | ORAL_TABLET | Freq: Two times a day (BID) | ORAL | Status: DC

## 2015-08-23 MED ORDER — CELECOXIB 400 MG PO CAPS: 400.0000 mg | ORAL_CAPSULE | Freq: Every day | ORAL | Status: DC

## 2015-08-23 NOTE — ED Notes (Signed)
Patient started having symptoms of left flank pain and lower back pain several weeks ago. The left flank and back pain have progressively become worse.

## 2015-08-23 NOTE — Telephone Encounter (Signed)
Patient called complaining of left sided flank pain.  It causes shortness of breath. She says its different than her normal pain. Advised patient to go to Urgent care or emergency room.  Patient was hesitant to go to urgent care/ER, nothing available at office.

## 2015-08-23 NOTE — Telephone Encounter (Signed)
[facility] Patient Name: ABEGAIL MCCALVIN DOB: 05/07/1950 Initial Comment Caller states, she say she wants to be called back after 9am -- She has pain in her left flank area, kidney area. Nurse Assessment Guidelines Guideline Title Affirmed Question Affirmed Notes Final Disposition User FINAL ATTEMPT MADE - message left [place], Therapist, sports, Dellis Filbert

## 2015-08-23 NOTE — ED Provider Notes (Signed)
CSN: [id number]     Arrival date & time 08/23/15  1143 History   First MD Initiated Contact with Patient 08/23/15 1308    Nurses notes were reviewed. Chief Complaint  Patient presents with  . Flank Pain    Patient with left flank pain. Patient states that she's had chronic low back pain since falling last year. The back became exacerbated some time in June. She is not the best historian as far as dates is concerned. Sometime April 3 she had epidural which seemed to help to some degree the lower back pain since then she's had a subsequent fall about 3-7 days after she had the epidural. Now she has a different type of back pain this back pain is more left flank pain and left lower back but not over the lumbar or sacral spine. She states that in the past she's tried chiropractic manipulation which has not helped low back pain and she's tried low-dose anti-inflammatories have not been successful either. Took some effort to try to get her to distinguish between the flank pain and low back pain that she has chronically.   Multiple medical problems hypertension squamous cell skin cancer of the face bipolar disorder diabetes arthritis. She's had bilateral hip replacement bladder augmentation cardiac catheterization hip arthroplasty knee arthroplasty as well. She's had cataract surgery in 2017 as well  Family history heart disease in mother and father and multiple grandparents.  She denies any burning of urination at this time and she does not smoke. 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     (Consider location/radiation/quality/duration/timing/severity/associated sxs/prior Treatment) Patient is a 65 y.o. female presenting with flank pain. The history is provided by the patient. No language interpreter was used.  Flank Pain This is a new problem. The current episode started more than 1 week ago. The problem occurs constantly. The problem has been gradually worsening. Pertinent negatives  include no chest pain, no abdominal pain, no headaches and no shortness of breath. The symptoms are aggravated by walking. Nothing relieves the symptoms. She has tried nothing for the symptoms. The treatment provided no relief.    Past Medical History  Diagnosis Date  . Hypertension   . Treadmill stress test negative for angina pectoris June 2013  . Squamous cell skin cancer, face     [facility]  . Bunion, left   . Bipolar disorder ([place])   . Diabetes mellitus     diet controlled.   . Arthritis     back, knees, fingers  . Lower back pain     S/P fall   Past Surgical History  Procedure Laterality Date  . Joint replacement  2012    bilateral hip  . Bladder augmentation    . Knee arthroscopy    . Vaginal delivery      2  . Tubal ligation    . Total hip arthroplasty  2012  . Cardiac catheterization  03/05/12    no stents  . Cataract extraction w/phaco Right 07/19/2015    Procedure: CATARACT EXTRACTION PHACO AND INTRAOCULAR LENS PLACEMENT (IOC) right eye;  Surgeon: Ronnell Freshwater, MD;  Location: [facility];  Service: Ophthalmology;  Laterality: Right;  BORDERLINE DIABETIC - oral meds   Family History  Problem Relation Age of Onset  . Heart disease Mother   . Stroke Mother   . Heart disease Father   . Heart disease Maternal Grandmother   . Heart disease Maternal Grandfather   . Heart disease Paternal  Grandmother   . Heart disease Paternal Grandfather    Social History  Substance Use Topics  . Smoking status: Never Smoker   . Smokeless tobacco: Never Used  . Alcohol Use: 0.6 oz/week    1 Cans of beer per week     Comment: occassionally   OB History    No data available     Review of Systems  Respiratory: Negative for shortness of breath.   Cardiovascular: Negative for chest pain.  Gastrointestinal: Negative for abdominal pain.  Genitourinary: Positive for flank pain.  Neurological: Negative for headaches.  All other systems reviewed and are  negative.   Allergies  Thimerosal  Home Medications   Prior to Admission medications   Medication Sig Start Date End Date Taking? Authorizing Provider  acetaminophen (TYLENOL) 325 MG tablet Take 650 mg by mouth every 6 (six) hours as needed.   Yes Historical Provider, MD  ALPRAZolam Duanne Moron) 0.5 MG tablet Take 1 tablet (0.5 mg total) by mouth at bedtime as needed. 05/26/15  Yes Crecencio Mc, MD  aspirin EC 81 MG tablet Take 81 mg by mouth daily.   Yes Historical Provider, MD  cyanocobalamin (,VITAMIN B-12,) 1000 MCG/ML injection Inject 1 mL (1,000 mcg total) into the muscle once a week. X 3, then weekly x 3, then monthly thereafter 04/22/15  Yes Crecencio Mc, MD  glucose blood (FREESTYLE LITE) test strip Use once daily  as instructed to test blood sugar E 11.9 04/29/14  Yes Crecencio Mc, MD  lamoTRIgine (LAMICTAL) 200 MG tablet Take 1 tablet (200 mg total) by mouth 2 (two) times daily. 07/26/15  Yes Crecencio Mc, MD  Lancets (FREESTYLE) lancets Use once daily to check blood sugars  E11.9 04/29/14  Yes Crecencio Mc, MD  losartan (COZAAR) 50 MG tablet TAKE 1 TABLET (50 MG TOTAL) BY MOUTH DAILY. 04/12/15  Yes Crecencio Mc, MD  Melatonin 5 MG TABS Take 20 mg by mouth at bedtime.   Yes Historical Provider, MD  metFORMIN (GLUCOPHAGE) 500 MG tablet TAKE 1 TABLET BY MOUTH 2 TIMES DAILY WITH A MEAL. 04/01/15  Yes Crecencio Mc, MD  naproxen (NAPROSYN) 250 MG tablet Take 250 mg by mouth 2 (two) times daily with a meal.   Yes Historical Provider, MD  omeprazole (PRILOSEC) 40 MG capsule TAKE 1 CAPSULE BY MOUTH DAILY. 01/15/15  Yes Crecencio Mc, MD  Syringe, Disposable, 1 ML MISC For use with B12 injections 12/23/14  Yes Crecencio Mc, MD  traZODone (DESYREL) 50 MG tablet Take 0.5-1 tablets (25-50 mg total) by mouth at bedtime as needed for sleep. 05/04/15  Yes Crecencio Mc, MD  celecoxib (CELEBREX) 400 MG capsule Take 1 capsule (400 mg total) by mouth daily after breakfast. 08/23/15   Frederich Cha,  MD  Itraconazole (ONMEL) 200 MG TABS Take by mouth daily.    Historical Provider, MD  orphenadrine (NORFLEX) 100 MG tablet Take 1 tablet (100 mg total) by mouth 2 (two) times daily. 08/23/15   Frederich Cha, MD  pregabalin (LYRICA) 150 MG capsule Take 1 capsule (150 mg total) by mouth 3 (three) times daily. Patient not taking: Reported on 07/19/2015 01/22/15   Crecencio Mc, MD   Meds Ordered and Administered this Visit  Medications - No data to display  BP 151/88 mmHg  Pulse 101  Temp(Src) 98.1 F (36.7 C) (Oral)  Resp 18  Ht 5\' 2"  (1.575 m)  Wt 160 lb (72.576 kg)  BMI 29.26 kg/m2  SpO2 96% No data found.   Physical Exam  Constitutional: She is oriented to person, place, and time. She appears well-developed and well-nourished.  HENT:  Head: Normocephalic and atraumatic.  Eyes: Pupils are equal, round, and reactive to light.  Neck: Neck supple.  Musculoskeletal: She exhibits tenderness.       Lumbar back: She exhibits tenderness, bony tenderness and spasm.       Back:  She has tenderness over the left iliac sacral joint area and then muscle spasms over the area above the left iliac sacral area.  Neurological: She is alert and oriented to person, place, and time.  Skin: Skin is warm. No rash noted. No erythema.  Psychiatric: She has a normal mood and affect.  Vitals reviewed.   ED Course  Procedures (including critical care time)  Labs Review Labs Reviewed  URINALYSIS COMPLETEWITH MICROSCOPIC ([place]) - Abnormal; Notable for the following:    Color, Urine STRAW (*)    Squamous Epithelial / LPF 0-5 (*)    All other components within normal limits    Imaging Review Dg Si Joints  08/23/2015  CLINICAL DATA:  Multiple falls. Pain centered about the left sacroiliac joint. Initial encounter. EXAM: BILATERAL SACROILIAC JOINTS - 3+ VIEW COMPARISON:  CT of 11/27/2014 FINDINGS: Bilateral hip arthroplasty. Convex right lumbar spine curvature. Sacroiliac joints are symmetric. No  acute hardware complication. No acute fracture or dislocation. IMPRESSION: No acute osseous abnormality. Electronically Signed   By: Abigail Miyamoto M.D.   On: 08/23/2015 14:13     Visual Acuity Review  Right Eye Distance:   Left Eye Distance:   Bilateral Distance:    Right Eye Near:   Left Eye Near:    Bilateral Near:     Results for orders placed or performed during the hospital encounter of 08/23/15  Urinalysis complete, with microscopic  Result Value Ref Range   Color, Urine STRAW (A) YELLOW   APPearance CLEAR CLEAR   Glucose, UA NEGATIVE NEGATIVE mg/dL   Bilirubin Urine NEGATIVE NEGATIVE   Ketones, ur NEGATIVE NEGATIVE mg/dL   Specific Gravity, Urine 1.015 1.005 - 1.030   Hgb urine dipstick NEGATIVE NEGATIVE   pH 7.5 5.0 - 8.0   Protein, ur NEGATIVE NEGATIVE mg/dL   Nitrite NEGATIVE NEGATIVE   Leukocytes, UA NEGATIVE NEGATIVE   RBC / HPF NONE SEEN 0 - 5 RBC/hpf   WBC, UA NONE SEEN 0 - 5 WBC/hpf   Bacteria, UA NONE SEEN NONE SEEN   Squamous Epithelial / LPF 0-5 (A) NONE SEEN      MDM   1. Muscle spasm of back   2. Sacro-iliac pain       As per discussion we'll place on Celebrex 200 mg 1 capsule daily to maximize anti-inflammatory treatment. Place on Norflex 100 mg twice a day follow-up PCP in 2-3 weeks not better.  Note: This dictation was prepared with Dragon dictation along with smaller phrase technology. Any transcriptional errors that result from this process are unintentional.  Frederich Cha, MD 08/23/15 1447

## 2015-08-23 NOTE — Discharge Instructions (Signed)
Muscle Cramps and Spasms Muscle cramps and spasms are when muscles tighten by themselves. They usually get better within minutes. Muscle cramps are painful. They are usually stronger and last longer than muscle spasms. Muscle spasms may or may not be painful. They can last a few seconds or much longer. HOME CARE  Drink enough fluid to keep your pee (urine) clear or pale yellow.  Massage, stretch, and relax the muscle.  Use a warm towel, heating pad, or warm shower water on tight muscles.  Place ice on the muscle if it is tender or in pain.  Put ice in a plastic bag.  Place a towel between your skin and the bag.  Leave the ice on for 15-20 minutes, 03-04 times a day.  Only take medicine as told by your doctor. GET HELP RIGHT AWAY IF:  Your cramps or spasms get worse, happen more often, or do not get better with time. MAKE SURE YOU:  Understand these instructions.  Will watch your condition.  Will get help right away if you are not doing well or get worse.   This information is not intended to replace advice given to you by your health care provider. Make sure you discuss any questions you have with your health care provider.   Document Released: 03/02/2008 Document Revised: 07/15/2012 Document Reviewed: 03/06/2012 Elsevier Interactive Patient Education 2016 Else[employer]  Sacroiliac Joint Dysfunction Sacroiliac joint dysfunction is a condition that causes inflammation on one or both sides of the sacroiliac (SI) joint. The SI joint connects the lower part of the spine (sacrum) with the two upper portions of the pelvis (ilium). This condition causes deep aching or burning pain in the low back. In some cases, the pain may also spread into one or both buttocks or hips or spread down the legs. CAUSES This condition may be caused by:  Pregnancy. During pregnancy, extra stress is put on the SI joints because the pelvis widens.  Injury, such as:  Car accidents.  Sport-related  injuries.  Work-related injuries.  Having one leg that is shorter than the other.  Conditions that affect the joints, such as:  Rheumatoid arthritis.  Gout.  Psoriatic arthritis.  Joint infection (septic arthritis). Sometimes, the cause of SI joint dysfunction is not known. SYMPTOMS Symptoms of this condition include:  Aching or burning pain in the lower back. The pain may also spread to other areas, such as:  Buttocks.  Groin.  Thighs and legs.  Muscle spasms in or around the painful areas.  Increased pain when standing, walking, running, stair climbing, bending, or lifting. DIAGNOSIS Your health care provider will do a physical exam and take your medical history. During the exam, the health care provider may move one or both of your legs to different positions to check for pain. Various tests may be done to help verify the diagnosis, including:  Imaging tests to look for other causes of pain. These may include:  MRI.  CT scan.  Bone scan.  Diagnostic injection. A numbing medicine is injected into the SI joint using a needle. If the pain is temporarily improved or stopped after the injection, this can indicate that SI joint dysfunction is the problem. TREATMENT Treatment may vary depending on the cause and severity of your condition. Treatment options may include:  Applying ice or heat to the lower back area. This can help to reduce pain and muscle spasms.  Medicines to relieve pain or inflammation or to relax the muscles.  Wearing a back  brace (sacroiliac brace) to help support the joint while your back is healing.  Physical therapy to increase muscle strength around the joint and flexibility at the joint. This may also involve learning proper body positions and ways of moving to relieve stress on the joint.  Direct manipulation of the SI joint.  Injections of steroid medicine into the joint in order to reduce pain and swelling.  Radiofrequency ablation to  burn away nerves that are carrying pain messages from the joint.  Use of a device that provides electrical stimulation in order to reduce pain at the joint.  Surgery to put in screws and plates that limit or prevent joint motion. This is rare. HOME CARE INSTRUCTIONS  Rest as needed. Limit your activities as directed by your health care provider.  Take medicines only as directed by your health care provider.  If directed, apply ice to the affected area:  Put ice in a plastic bag.  Place a towel between your skin and the bag.  Leave the ice on for 20 minutes, 2-3 times per day.  Use a heating pad or a moist heat pack as directed by your health care provider.  Exercise as directed by your health care provider or physical therapist.  Keep all follow-up visits as directed by your health care provider. This is important. SEEK MEDICAL CARE IF:  Your pain is not controlled with medicine.  You have a fever.  You have increasingly severe pain. SEEK IMMEDIATE MEDICAL CARE IF:  You have weakness, numbness, or tingling in your legs or feet.  You lose control of your bladder or bowel.   This information is not intended to replace advice given to you by your health care provider. Make sure you discuss any questions you have with your health care provider.   Document Released: 06/16/2008 Document Revised: 08/04/2014 Document Reviewed: 11/25/2013 Elsevier Interactive Patient Education [employer].

## 2015-08-25 ENCOUNTER — Ambulatory Visit: Payer: 59 | Attending: Internal Medicine

## 2015-08-25 ENCOUNTER — Telehealth: Payer: Self-pay | Admitting: Internal Medicine

## 2015-08-25 DIAGNOSIS — R262 Difficulty in walking, not elsewhere classified: Secondary | ICD-10-CM | POA: Insufficient documentation

## 2015-08-25 DIAGNOSIS — M545 Low back pain: Secondary | ICD-10-CM | POA: Insufficient documentation

## 2015-08-25 DIAGNOSIS — M6281 Muscle weakness (generalized): Secondary | ICD-10-CM | POA: Diagnosis present

## 2015-08-25 NOTE — Therapy (Signed)
[facility] [address] [place], [address] Phone: [phone]   Fax:  [phone]  Physical Therapy Evaluation  Patient Details  Name: Katherine Jimenez MRN: [id number] Date of Birth: [birth year] Referring Provider: Deborra Medina, MD  Encounter Date: 08/25/2015      PT End of Session - 08/25/15 0937    Visit Number 1   Number of Visits 1   PT Start Time 0937   PT Stop Time 1039   PT Time Calculation (min) 62 min   Activity Tolerance Patient tolerated treatment well   Behavior During Therapy [facility] for tasks assessed/performed      Past Medical History  Diagnosis Date  . Hypertension   . Treadmill stress test negative for angina pectoris June 2013  . Squamous cell skin cancer, face     [facility]  . Bunion, left   . Bipolar disorder ([place])   . Diabetes mellitus     diet controlled.   . Arthritis     back, knees, fingers  . Lower back pain     S/P fall  . Depression     Past Surgical History  Procedure Laterality Date  . Joint replacement  2012    bilateral hip  . Bladder augmentation    . Knee arthroscopy    . Vaginal delivery      2  . Tubal ligation    . Total hip arthroplasty  2012  . Cardiac catheterization  03/05/12    no stents  . Cataract extraction w/phaco Right 07/19/2015    Procedure: CATARACT EXTRACTION PHACO AND INTRAOCULAR LENS PLACEMENT (IOC) right eye;  Surgeon: Ronnell Freshwater, MD;  Location: [facility];  Service: Ophthalmology;  Laterality: Right;  BORDERLINE DIABETIC - oral meds    There were no vitals filed for this visit.       Subjective Assessment - 08/25/15 0942    Subjective Back pain. 6/10 currently (pt sitting with L leg crossed over her R). 1-2/10 at best. 9/10 at worst.  Central low back pain, and L SI joint pain.    Pertinent History Degenerative lumbar spinal stenosis and SI joint pain. Pt has been having increased low back pain since her fall  on September 14, 2014. Pt fell while stepping off a curb. Pt landed onto her R side side. Pt states tendency to fall onto her R side. Had 4 falls within the span of 6 months due cataracts and wrong glasses prescriptions. Most recent fall was between April 3-13, 2017. Had an epidural 4/3, 2017 for her low back and was pain free until her fall in April. Had catarct surgery on 07/15/2015 and currently sees 20/20.  Denies bowel or bladder problems or tingling or numbness. Usually has cramping pain bilateral inside thighs.  Next epural is September 09, 2015. Next MD appointment is this Friday 08/27/2015.    Diagnostic tests Had MRI's for her back and pelvis which revealed scoliosis, degenerative discs, bulging discs. Pt states that her back problems are non-surgical.    Patient Stated Goals Just get rid of the pain.    Currently in Pain? Yes   Pain Score 6    Pain Location Back   Pain Descriptors / Indicators Aching;Stabbing   Aggravating Factors  getting out of the bed, turning over in her bed at night, walking, stair negotiation   Pain Relieving Factors getting in the water (goes to water aerobics at the gym, low impact)  Multiple Pain Sites No            [facility] PT Assessment - 08/25/15 0957    Assessment   Medical Diagnosis Degenerative lumbar spinal stenosis   Referring Provider Deborra Medina, MD   Onset Date/Surgical Date 09/14/14   Next MD Visit 08/27/2015   Prior Therapy Had PT for back years ago. Currently participates in water exercises at her gym which helps.    Precautions   Precaution Comments No known precautions   Restrictions   Other Position/Activity Restrictions No known restrictions   Balance Screen   Has the patient fallen in the past 6 months Yes   How many times? 4  vision related; corrected with cataract surgery   Has the patient had a decrease in activity level because of a fear of falling?  No   Is the patient reluctant to leave their home because of a fear of falling?  No    Prior Function   Vocation Retired  Cabin crew Prior level of function: pt able to work full time   Observation/Other Assessments   Observations Long sit test suggests posterior nutation of L innominate. 10 meter walk test: 0.74 meters/ second comfortable pace (norm: 1.3 meters per second suggesting decreased gait speed). TUG: 16 seconds, 16 seconds, 15 seconds (greater than 12 seconds suggests increased fall risk)   Modified [place] 54%    Lower Extremity Functional Scale  39/80   Posture/Postural Control   Posture Comments bilaterally protracted shoulders and neck, slight pronation bilateral feet R > L, R lateral shift, R iliac crest higher than L, R greater trochanter higher, R thoracic rotation   AROM   Lumbar Flexion limited with low back pain; R trunk rotation (scoliosis)   Lumbar Extension very limited with low back pain (central to L low back pain)   Lumbar - Right Side Bend limited with low back pain   Lumbar - Left Side Bend limited with low back and L SI pain   Lumbar - Right Rotation low back and L SI pain   Lumbar - Left Rotation low back pain   Strength   Right Hip Flexion 4/5  with medial thigh discomfort   Right Hip Extension 3+/5  with low back pain   Right Hip ABduction 4/5   Left Hip Flexion 4-/5  with medial thigh and low back discomfort   Left Hip Extension 3-/5  with low back pain   Left Hip ABduction 4-/5   Right Knee Flexion 4/5   Right Knee Extension 5/5   Left Knee Flexion 4-/5   Left Knee Extension 4+/5   Palpation   Palpation comment L ASIS slightly higher and posterior compared to R. TTP bilateral ASIS    Ambulation/Gait   Gait Comments antalgic, decreased stance L LE, decreased trunk rotation         Objectives  There-ex  Pt education on cauda equina symptoms and for pt to contact MD if she has them. Pt verbalized understanding.   Directed patient with seated manually resisted L hip flexion isometrics 3x5 with 5 second  holds  Seated R hip extension isometrics  2x5 with 5 second holds     Improved exercise technique, movement at target joints, use of target muscles after mod verbal, visual, tactile cues.    Pt tolerated session well without aggravation of symptoms.                      PT  Education - 08/25/15 1918    Education provided Yes   Education Details ther-ex   [employer]) Educated Patient   Methods Explanation;Demonstration;Tactile cues;Verbal cues   Comprehension Verbalized understanding;Returned demonstration                    Plan - 08/25/15 1236    Clinical Impression Statement Patient is a 65 year old female who came to physical therapy secondary to degenerative lumbar spinal stenosis. She also presents with low back pain, weakness, altered gait pattern and posture, decreased gait speed, positive special test suggesting lumbopelvic involvement, reproduction of symptoms with lumbar AROM all planes,  and difficulty performing functional tasks such as getting up from a chair, walking, and stair negotiation.     Rehab Potential Fair   Clinical Impairments Affecting Rehab Potential pain, chronicity of condition   PT Treatment/Interventions Therapeutic exercise  Today's eval only.    PT Next Visit Plan This visit is for a PT eval only per MD order. Pt to continue participating in water aerobics at her gym especially since patient feels it helps.    Consulted and Agree with Plan of Care Patient      Patient will benefit from skilled therapeutic intervention in order to improve the following deficits and impairments:     Visit Diagnosis: Bilateral low back pain, with sciatica presence unspecified - Plan: PT plan of care cert/re-cert  Difficulty in walking, not elsewhere classified - Plan: PT plan of care cert/re-cert  Muscle weakness (generalized) - Plan: PT plan of care cert/re-cert     Problem List Patient Active Problem List   Diagnosis Date Noted   . Insomnia secondary to depression with anxiety 05/29/2015  . Palpitations 03/02/2015  . Fibromyalgia 01/30/2015  . B12 deficiency 12/23/2014  . Diabetes mellitus without complication ([place]) [id number]  . Cystocele 08/24/2013  . Visit for preventive health examination 01/21/2013  . Overweight (BMI 25.0-29.9) 01/21/2013  . Hyperlipidemia 02/28/2012  . Angina at rest [facility] 12/19/2011  . Degenerative lumbar spinal stenosis 09/24/2011  . Hypertension 08/31/2011  . Bipolar disorder ([place]) 08/31/2011  . Anemia 08/31/2011   Thank you for your referral.   Joneen Boers PT, DPT    08/25/2015, 7:37 PM  [facility] [address] [place], [address] Phone: [phone]   Fax:  [phone]  Name: STEFFIE KLEINPETER MRN: [id number] Date of Birth: 1951-03-02

## 2015-08-25 NOTE — Telephone Encounter (Signed)
Pt asking for clarification verbal given by MD patient to have evaluation only. Notified PT

## 2015-08-27 ENCOUNTER — Encounter: Payer: Self-pay | Admitting: Internal Medicine

## 2015-08-27 ENCOUNTER — Ambulatory Visit (INDEPENDENT_AMBULATORY_CARE_PROVIDER_SITE_OTHER): Payer: 59 | Admitting: Internal Medicine

## 2015-08-27 ENCOUNTER — Other Ambulatory Visit: Payer: Self-pay | Admitting: *Deleted

## 2015-08-27 VITALS — BP 120/86 | HR 104 | Temp 99.4°F | Wt 159.0 lb

## 2015-08-27 DIAGNOSIS — E119 Type 2 diabetes mellitus without complications: Secondary | ICD-10-CM | POA: Diagnosis not present

## 2015-08-27 DIAGNOSIS — Z760 Encounter for issue of repeat prescription: Secondary | ICD-10-CM

## 2015-08-27 DIAGNOSIS — R0789 Other chest pain: Secondary | ICD-10-CM | POA: Diagnosis not present

## 2015-08-27 DIAGNOSIS — E785 Hyperlipidemia, unspecified: Secondary | ICD-10-CM

## 2015-08-27 DIAGNOSIS — R000 Tachycardia, unspecified: Secondary | ICD-10-CM | POA: Diagnosis not present

## 2015-08-27 DIAGNOSIS — G478 Other sleep disorders: Secondary | ICD-10-CM

## 2015-08-27 DIAGNOSIS — R0781 Pleurodynia: Secondary | ICD-10-CM

## 2015-08-27 DIAGNOSIS — G4700 Insomnia, unspecified: Secondary | ICD-10-CM

## 2015-08-27 LAB — COMPREHENSIVE METABOLIC PANEL
ALT: 15 U/L (ref 0–35)
AST: 10 U/L (ref 0–37)
Albumin: 4.5 g/dL (ref 3.5–5.2)
Alkaline Phosphatase: 62 U/L (ref 39–117)
BUN: 10 mg/dL (ref 6–23)
CO2: 26 mEq/L (ref 19–32)
Calcium: 10.3 mg/dL (ref 8.4–10.5)
Chloride: 103 mEq/L (ref 96–112)
Creatinine, Ser: 0.78 mg/dL (ref 0.40–1.20)
GFR: 79.15 mL/min (ref >60.00)
Glucose, Bld: 120 mg/dL — ABNORMAL HIGH (ref 70–99)
Potassium: 4.2 mEq/L (ref 3.5–5.1)
Sodium: 139 mEq/L (ref 135–145)
Total Bilirubin: 0.4 mg/dL (ref 0.2–1.2)
Total Protein: 6.8 g/dL (ref 6.0–8.3)

## 2015-08-27 LAB — CBC WITH DIFFERENTIAL/PLATELET
Basophils Absolute: 0 10*3/uL (ref 0.0–0.1)
Basophils Relative: 0.6 % (ref 0.0–3.0)
Eosinophils Absolute: 0 10*3/uL (ref 0.0–0.7)
Eosinophils Relative: 0.5 % (ref 0.0–5.0)
HCT: 38.8 % (ref 36.0–46.0)
Hemoglobin: 12.3 g/dL (ref 12.0–15.0)
Lymphocytes Relative: 34.3 % (ref 12.0–46.0)
Lymphs Abs: 2.4 10*3/uL (ref 0.7–4.0)
MCHC: 31.6 g/dL (ref 30.0–36.0)
MCV: 77.9 fl — ABNORMAL LOW (ref 78.0–100.0)
Monocytes Absolute: 0.4 10*3/uL (ref 0.1–1.0)
Monocytes Relative: 5.6 % (ref 3.0–12.0)
Neutro Abs: 4.2 10*3/uL (ref 1.4–7.7)
Neutrophils Relative %: 59 % (ref 43.0–77.0)
Platelets: 427 10*3/uL — ABNORMAL HIGH (ref 150.0–400.0)
RBC: 4.99 Mil/uL (ref 3.87–5.11)
RDW: 20.5 % — ABNORMAL HIGH (ref 11.5–15.5)
WBC: 7.1 10*3/uL (ref 4.0–10.5)

## 2015-08-27 LAB — LIPID PANEL
Cholesterol: 254 mg/dL — ABNORMAL HIGH (ref 0–200)
HDL: 87 mg/dL (ref >39.00)
LDL Cholesterol: 142 mg/dL — ABNORMAL HIGH (ref 0–99)
NonHDL: 167.2
Total CHOL/HDL Ratio: 3
Triglycerides: 126 mg/dL (ref 0.0–149.0)
VLDL: 25.2 mg/dL (ref 0.0–40.0)

## 2015-08-27 LAB — TROPONIN I: TNIDX: 0 ug/l (ref 0.00–0.06)

## 2015-08-27 LAB — GLUCOSE, POCT (MANUAL RESULT ENTRY): POC Glucose: 130 mg/dl — AB (ref 70–99)

## 2015-08-27 LAB — LDL CHOLESTEROL, DIRECT: Direct LDL: 129 mg/dL

## 2015-08-27 LAB — HEMOGLOBIN A1C: Hgb A1c MFr Bld: 7.4 % — ABNORMAL HIGH (ref 4.6–6.5)

## 2015-08-27 MED ORDER — ALPRAZOLAM 0.5 MG PO TABS: 0.5000 mg | ORAL_TABLET | Freq: Every evening | ORAL | Status: DC | PRN

## 2015-08-27 NOTE — Progress Notes (Addendum)
Subjective:  Patient ID: Katherine Jimenez, female    DOB: 05-30-1950  Age: 65 y.o. MRN: [id number]  CC: The primary encounter diagnosis was Diabetes mellitus without complication ([place]). Diagnoses of Other chest pain, Tachycardia, Hyperlipidemia, Frequent nocturnal awakening, and Pleurodynia were also pertinent to this visit.  HPI Katherine Jimenez presents for follow up on Type 2 DM,  spinal stenosis and other issues  May 16 had  A "stomach bug" while at the beach.. Symptoms lasted < 48 hours   She is seeing Dr Sharlet Salina for management of DDD .Marland Kitchen Her back pain became severe last Sunday .  Went to [facility] [place]   Had cataract surgery april 17th left eye   Vision restore to 20/20   ED eval May 22 for left flank pain.  was evaluated for UTI and SI joint films done .  No acute changes or hardware mishaps.  cleberex given  and norflex    Having recurrent symptoms of presumed hypoglycemia, waking her up with diaphoresis, tremulousness,  However her  BS readings have been 120 and 169 on 2 of these occasions  She continues to have episodes of nocturnal chest pain . It does not occur daily . She underwent diagnostic cardiac catheterization  By [facility] in 2013.  No significant disease, EF > 55%  Outpatient Prescriptions Prior to Visit  Medication Sig Dispense Refill  . acetaminophen (TYLENOL) 325 MG tablet Take 650 mg by mouth every 6 (six) hours as needed.    Marland Kitchen aspirin EC 81 MG tablet Take 81 mg by mouth daily.    . cyanocobalamin (,VITAMIN B-12,) 1000 MCG/ML injection Inject 1 mL (1,000 mcg total) into the muscle once a week. X 3, then weekly x 3, then monthly thereafter 10 mL 11  . glucose blood (FREESTYLE LITE) test strip Use once daily  as instructed to test blood sugar E 11.9 100 each 3  . lamoTRIgine (LAMICTAL) 200 MG tablet Take 1 tablet (200 mg total) by mouth 2 (two) times daily. 180 tablet 1  . Lancets (FREESTYLE) lancets Use once daily to check blood sugars  E11.9 100 each 3  .  losartan (COZAAR) 50 MG tablet TAKE 1 TABLET (50 MG TOTAL) BY MOUTH DAILY. 90 tablet 1  . Melatonin 5 MG TABS Take 20 mg by mouth at bedtime.    . naproxen (NAPROSYN) 250 MG tablet Take 250 mg by mouth 2 (two) times daily with a meal.    . omeprazole (PRILOSEC) 40 MG capsule TAKE 1 CAPSULE BY MOUTH DAILY. 90 capsule 3  . Syringe, Disposable, 1 ML MISC For use with B12 injections 25 each 3  . traZODone (DESYREL) 50 MG tablet Take 0.5-1 tablets (25-50 mg total) by mouth at bedtime as needed for sleep. 30 tablet 3  . ALPRAZolam (XANAX) 0.5 MG tablet Take 1 tablet (0.5 mg total) by mouth at bedtime as needed. 30 tablet 2  . metFORMIN (GLUCOPHAGE) 500 MG tablet TAKE 1 TABLET BY MOUTH 2 TIMES DAILY WITH A MEAL. 180 tablet 3  . celecoxib (CELEBREX) 400 MG capsule Take 1 capsule (400 mg total) by mouth daily after breakfast. 30 capsule 0  . Itraconazole (ONMEL) 200 MG TABS Take by mouth daily.    . orphenadrine (NORFLEX) 100 MG tablet Take 1 tablet (100 mg total) by mouth 2 (two) times daily. (Patient not taking: Reported on 08/25/2015) 60 tablet 0  . pregabalin (LYRICA) 150 MG capsule Take 1 capsule (150 mg total) by mouth 3 (three) times  daily. (Patient not taking: Reported on 07/19/2015) 90 capsule 1   No facility-administered medications prior to visit.    Review of Systems;  Patient denies headache, fevers, malaise, unintentional weight loss, skin rash, eye pain, sinus congestion and sinus pain, sore throat, dysphagia,  hemoptysis , cough, dyspnea, wheezing, chest pain, palpitations, orthopnea, edema, abdominal pain, nausea, melena, diarrhea, constipation, flank pain, dysuria, hematuria, urinary  Frequency, nocturia, numbness, tingling, seizures,  Focal weakness, Loss of consciousness,  Tremor, insomnia, depression, anxiety, and suicidal ideation.      Objective:  BP 120/86 mmHg  Pulse 104  Temp(Src) 99.4 F (37.4 C) (Oral)  Wt 159 lb (72.122 kg)  BP Readings from Last 3 Encounters:    08/27/15 120/86  08/23/15 151/88  07/19/15 142/83    Wt Readings from Last 3 Encounters:  08/27/15 159 lb (72.122 kg)  08/23/15 160 lb (72.576 kg)  07/19/15 165 lb (74.844 kg)    General appearance: alert, cooperative and appears stated age Ears: normal TM's and external ear canals both ears Throat: lips, mucosa, and tongue normal; teeth and gums normal Neck: no adenopathy, no carotid bruit, supple, symmetrical, trachea midline and thyroid not enlarged, symmetric, no tenderness/mass/nodules Back: symmetric, no curvature. ROM normal. No CVA tenderness. Lungs: clear to auscultation bilaterally Heart: regular rate and rhythm, S1, S2 normal, no murmur, click, rub or gallop Abdomen: soft, non-tender; bowel sounds normal; no masses,  no organomegaly Pulses: 2+ and symmetric Skin: Skin color, texture, turgor normal. No rashes or lesions Lymph nodes: Cervical, supraclavicular, and axillary nodes normal.  Lab Results  Component Value Date   HGBA1C 7.4* 08/27/2015   HGBA1C 6.6* 04/22/2015   HGBA1C 6.4 06/10/2014    Lab Results  Component Value Date   CREATININE 0.78 08/27/2015   CREATININE 0.86 04/22/2015   CREATININE 0.76 06/10/2014    Lab Results  Component Value Date   WBC 7.1 08/27/2015   HGB 12.3 08/27/2015   HCT 38.8 08/27/2015   PLT 427.0* 08/27/2015   GLUCOSE 120* 08/27/2015   CHOL 254* 08/27/2015   TRIG 126.0 08/27/2015   HDL 87.00 08/27/2015   LDLDIRECT 129.0 08/27/2015   LDLCALC 142* 08/27/2015   ALT 15 08/27/2015   AST 10 08/27/2015   NA 139 08/27/2015   K 4.2 08/27/2015   CL 103 08/27/2015   CREATININE 0.78 08/27/2015   BUN 10 08/27/2015   CO2 26 08/27/2015   TSH 0.681 12/18/2014   INR 0.99 11/27/2014   HGBA1C 7.4* 08/27/2015   MICROALBUR 0.8 04/22/2015    Dg Si Joints  08/23/2015  CLINICAL DATA:  Multiple falls. Pain centered about the left sacroiliac joint. Initial encounter. EXAM: BILATERAL SACROILIAC JOINTS - 3+ VIEW COMPARISON:  CT of  11/27/2014 FINDINGS: Bilateral hip arthroplasty. Convex right lumbar spine curvature. Sacroiliac joints are symmetric. No acute hardware complication. No acute fracture or dislocation. IMPRESSION: No acute osseous abnormality. Electronically Signed   By: Abigail Miyamoto M.D.   On: 08/23/2015 14:13    Assessment & Plan:   Problem List Items Addressed This Visit    Hyperlipidemia     Based on current lipid profile, the risk of clinically significant CAD is 14% over the next 10 years, using the Framingham risk calculator. The [employer] of [employer] recommends starting patients aged 75 or higher on moderate intensity statin therapy for LDL between 70-189 .  She has deferred treatment and her cholesterol has risen.  Will advise statin therapy fo primary prevention . Lab Results  Component Value Date  CHOL 254* 08/27/2015   HDL 87.00 08/27/2015   LDLCALC 142* 08/27/2015   LDLDIRECT 129.0 08/27/2015   TRIG 126.0 08/27/2015   CHOLHDL 3 08/27/2015             Relevant Orders   Lipid panel (Completed)   LDL cholesterol, direct (Completed)   Diabetes mellitus without complication ([place]) - Primary    Her A1c has risen from 6.6 to 4.4.  Sh will need to increase her metformin to 850 mg bid. I have recommended stricter adherence to  a low glycemic index diet and regular exercise a minimum of 5 days per week.   Lab Results  Component Value Date   HGBA1C 7.4* 08/27/2015    Lab Results  Component Value Date   MICROALBUR 0.8 04/22/2015             Relevant Medications   metFORMIN (GLUCOPHAGE) 850 MG tablet   Other Relevant Orders   Comprehensive metabolic panel (Completed)   Hemoglobin A1c (Completed)   Lipid panel (Completed)   POC Glucose (CBG) (Completed)   Frequent nocturnal awakening    Will need to consider sleep study since home BS readings have not supported the etiology as being  hypoglycemic events.       Chest pain    Prior workup i n 2013 included a diagnostic  cath with no significant disease seen       Relevant Orders   Troponin I (Completed)    Other Visit Diagnoses    Tachycardia        Relevant Orders    CBC with Differential/Platelet (Completed)    Troponin I (Completed)    T4 AND TSH       I have discontinued Ms. Lemay's pregabalin, Itraconazole, celecoxib, and orphenadrine. I have also changed her metFORMIN. Additionally, I am having her maintain her naproxen, acetaminophen, glucose blood, freestyle, Syringe (Disposable), omeprazole, aspirin EC, losartan, cyanocobalamin, traZODone, Melatonin, and lamoTRIgine.  Meds ordered this encounter  Medications  . metFORMIN (GLUCOPHAGE) 850 MG tablet    Sig: Take 1 tablet (850 mg total) by mouth 2 (two) times daily with a meal.    Dispense:  180 tablet    Refill:  2    Medications Discontinued During This Encounter  Medication Reason  . celecoxib (CELEBREX) 400 MG capsule   . Itraconazole (ONMEL) 200 MG TABS   . metFORMIN (GLUCOPHAGE) 500 MG tablet Reorder  . orphenadrine (NORFLEX) 100 MG tablet   . pregabalin (LYRICA) 150 MG capsule     Follow-up: No Follow-up on file.   Crecencio Mc, MD

## 2015-08-27 NOTE — Assessment & Plan Note (Addendum)
Her A1c has risen from 6.6 to 4.4.  Sh will need to increase her metformin to 850 mg bid. I have recommended stricter adherence to  a low glycemic index diet and regular exercise a minimum of 5 days per week.   Lab Results  Component Value Date   HGBA1C 7.4* 08/27/2015    Lab Results  Component Value Date   MICROALBUR 0.8 04/22/2015

## 2015-08-27 NOTE — Patient Instructions (Addendum)
You may be having low blood sugars  From fasting state   You might want to try a premixed protein drink for breakfast called Premier Protein.   It is less $$$ and very low sugar.    160 cal  30 g protein  1 g sugar 50% calcium needs   I also recommend keeping the Low Glycemic Index (5 g sugar) KIND bars handy

## 2015-08-27 NOTE — Telephone Encounter (Signed)
Rx faxed

## 2015-08-27 NOTE — Progress Notes (Signed)
Pre visit review using our clinic review tool, if applicable. No additional management support is needed unless otherwise documented below in the visit note. 

## 2015-08-27 NOTE — Telephone Encounter (Signed)
Patient requested to have her Xanax refilled [place]

## 2015-08-29 DIAGNOSIS — R079 Chest pain, unspecified: Secondary | ICD-10-CM | POA: Insufficient documentation

## 2015-08-29 DIAGNOSIS — G4700 Insomnia, unspecified: Secondary | ICD-10-CM | POA: Insufficient documentation

## 2015-08-29 DIAGNOSIS — R0789 Other chest pain: Secondary | ICD-10-CM | POA: Insufficient documentation

## 2015-08-29 MED ORDER — METFORMIN HCL 850 MG PO TABS: 850.0000 mg | ORAL_TABLET | Freq: Two times a day (BID) | ORAL | Status: DC

## 2015-08-29 NOTE — Assessment & Plan Note (Signed)
Prior workup i n 2013 included a diagnostic cath with no significant disease seen

## 2015-08-29 NOTE — Assessment & Plan Note (Signed)
Will need to consider sleep study since home BS readings have not supported the etiology as being  hypoglycemic events.

## 2015-08-29 NOTE — Assessment & Plan Note (Signed)
  Based on current lipid profile, the risk of clinically significant CAD is 14% over the next 10 years, using the Framingham risk calculator. The [employer] recommends starting patients aged 64 or higher on moderate intensity statin therapy for LDL between 70-189 .  She has deferred treatment and her cholesterol has risen.  Will advise statin therapy fo primary prevention . Lab Results  Component Value Date   CHOL 254* 08/27/2015   HDL 87.00 08/27/2015   LDLCALC 142* 08/27/2015   LDLDIRECT 129.0 08/27/2015   TRIG 126.0 08/27/2015   CHOLHDL 3 08/27/2015

## 2015-08-31 ENCOUNTER — Other Ambulatory Visit: Payer: Self-pay

## 2015-08-31 ENCOUNTER — Telehealth: Payer: Self-pay | Admitting: *Deleted

## 2015-08-31 MED ORDER — BLOOD GLUCOSE METER KIT: PACK | Status: AC

## 2015-08-31 NOTE — Telephone Encounter (Signed)
Spoke with patient. Informed her that we have a ONE TOUCH glucose meter here that she can have, she can call her pharmacy to see if insurance covers the one touch test strips and if so have them call us to request those. Patient thanked [place] and said she would be by tomorrow to pick up the meter.

## 2015-08-31 NOTE — Telephone Encounter (Signed)
Patient stated, a Rx for a glucose machine was sent over to [facility], they do not carry glucose machines, just strips. She questioned if the office could calibrate her machine that she has at home.  [phone]

## 2015-08-31 NOTE — Telephone Encounter (Signed)
Left message on voice mail that we do not calibrate glucose machines and to call us back with further instructions

## 2015-08-31 NOTE — Telephone Encounter (Signed)
Patient requested to have her metformin 800 mg filled [place] She questioned if she could get a glucose meter from office.

## 2015-09-08 ENCOUNTER — Other Ambulatory Visit: Payer: Self-pay | Admitting: *Deleted

## 2015-09-08 MED ORDER — TRAZODONE HCL 50 MG PO TABS: 25.0000 mg | ORAL_TABLET | Freq: Every evening | ORAL | Status: DC | PRN

## 2015-09-08 MED ORDER — LOSARTAN POTASSIUM 50 MG PO TABS: ORAL_TABLET | ORAL | Status: DC

## 2015-10-20 ENCOUNTER — Telehealth: Payer: Self-pay | Admitting: *Deleted

## 2015-10-20 ENCOUNTER — Other Ambulatory Visit (INDEPENDENT_AMBULATORY_CARE_PROVIDER_SITE_OTHER): Payer: Managed Care, Other (non HMO)

## 2015-10-20 DIAGNOSIS — K055 Other periodontal diseases: Secondary | ICD-10-CM

## 2015-10-20 DIAGNOSIS — K068 Other specified disorders of gingiva and edentulous alveolar ridge: Secondary | ICD-10-CM

## 2015-10-20 DIAGNOSIS — D649 Anemia, unspecified: Secondary | ICD-10-CM

## 2015-10-20 DIAGNOSIS — R000 Tachycardia, unspecified: Secondary | ICD-10-CM

## 2015-10-20 LAB — VITAMIN B12: Vitamin B-12: 615 pg/mL (ref 211–911)

## 2015-10-20 LAB — CBC WITH DIFFERENTIAL/PLATELET
Basophils Absolute: 0.1 10*3/uL (ref 0.0–0.1)
Basophils Relative: 0.6 % (ref 0.0–3.0)
Eosinophils Absolute: 0.1 10*3/uL (ref 0.0–0.7)
Eosinophils Relative: 1.4 % (ref 0.0–5.0)
HCT: 35.2 % — ABNORMAL LOW (ref 36.0–46.0)
Hemoglobin: 11.3 g/dL — ABNORMAL LOW (ref 12.0–15.0)
Lymphocytes Relative: 31.4 % (ref 12.0–46.0)
Lymphs Abs: 2.7 10*3/uL (ref 0.7–4.0)
MCHC: 32 g/dL (ref 30.0–36.0)
MCV: 81.4 fl (ref 78.0–100.0)
Monocytes Absolute: 0.4 10*3/uL (ref 0.1–1.0)
Monocytes Relative: 5 % (ref 3.0–12.0)
Neutro Abs: 5.4 10*3/uL (ref 1.4–7.7)
Neutrophils Relative %: 61.6 % (ref 43.0–77.0)
Platelets: 424 10*3/uL — ABNORMAL HIGH (ref 150.0–400.0)
RBC: 4.33 Mil/uL (ref 3.87–5.11)
RDW: 18.4 % — ABNORMAL HIGH (ref 11.5–15.5)
WBC: 8.7 10*3/uL (ref 4.0–10.5)

## 2015-10-20 LAB — RETICULOCYTES
ABS Retic: 43100 cells/uL (ref 20000–80000)
RBC.: 4.31 MIL/uL (ref 3.80–5.10)
Retic Ct Pct: 1 %

## 2015-10-20 LAB — APTT: aPTT: 30.2 s (ref 23.4–32.7)

## 2015-10-20 LAB — PROTIME-INR
INR: 1.2 ratio — ABNORMAL HIGH (ref 0.8–1.0)
Prothrombin Time: 12.1 s (ref 9.6–13.1)

## 2015-10-20 LAB — FERRITIN: Ferritin: 10 ng/mL (ref 10.0–291.0)

## 2015-10-20 NOTE — Telephone Encounter (Signed)
Left message to call office

## 2015-10-20 NOTE — Telephone Encounter (Signed)
Patient calling due to bleeding gums when sh e brushes her teeth, and easily bruising, Patient taking 81 mg ASA Daily.  Patient wondering with HX of Stroke she can take ASA every other day or cut in half. Patient also donated blood 2 months ago and stated Hgb was 9 at [employer]. Patient is having spinal stimulator inserted soon no definite date yet. Concerned with bleeding at procedure site. Last Hgb in office May  12.5,does she need to come in for come in for CBC and PTT?

## 2015-10-20 NOTE — Telephone Encounter (Signed)
Yes, labs ordered.  She can reduce aspirin to every other day, and she will need to have suspension  Of aspirin and fish oil (if taking) for procedure

## 2015-10-20 NOTE — Telephone Encounter (Signed)
Patient has requested a phone consult from [name] to discuss her Asprin and brushing.  Pt contact   [phone]

## 2015-10-20 NOTE — Telephone Encounter (Signed)
Left patient message to call office

## 2015-10-20 NOTE — Telephone Encounter (Signed)
Left message for patient to return my call.

## 2015-10-20 NOTE — Addendum Note (Signed)
Addended by: Frutoso Chase A on: 10/20/2015 04:02 PM   Modules accepted: Orders

## 2015-10-20 NOTE — Telephone Encounter (Signed)
Patient notified and voiced understanding.

## 2015-10-20 NOTE — Telephone Encounter (Signed)
Pt called returning your call. Thank you! °

## 2015-10-20 NOTE — Telephone Encounter (Signed)
Please advise, thanks.

## 2015-10-21 LAB — T4 AND TSH
T4, Total: 10.5 ug/dL (ref 4.5–12.0)
TSH: 0.525 u[IU]/mL (ref 0.450–4.500)

## 2015-10-21 LAB — IRON AND TIBC
%SAT: 6 % — ABNORMAL LOW (ref 11–50)
Iron: 29 ug/dL — ABNORMAL LOW (ref 45–160)
TIBC: 505 ug/dL — ABNORMAL HIGH (ref 250–450)
UIBC: 476 ug/dL — ABNORMAL HIGH (ref 125–400)

## 2015-10-21 LAB — FOLATE RBC: RBC Folate: 728 ng/mL (ref >280)

## 2015-10-21 NOTE — Telephone Encounter (Signed)
Pt called to let you know that she wanted to go to Dr. Vira Agar.. Please advise pt with an questions.Marland Kitchen

## 2015-10-26 ENCOUNTER — Other Ambulatory Visit: Payer: Self-pay | Admitting: Internal Medicine

## 2015-10-26 ENCOUNTER — Telehealth: Payer: Self-pay

## 2015-10-26 DIAGNOSIS — D500 Iron deficiency anemia secondary to blood loss (chronic): Secondary | ICD-10-CM

## 2015-10-26 NOTE — Telephone Encounter (Signed)
Noted thanks °

## 2015-10-26 NOTE — Telephone Encounter (Signed)
Spoke with the patient, she wanted some clarification on her labs, reviewed them again with her.  She wanted to know if the referral can be to [facility] for the colonscopy, thanks

## 2015-10-26 NOTE — Telephone Encounter (Signed)
LOV 08/27/2015. Renaldo Fiddler, CMA

## 2015-10-26 NOTE — Assessment & Plan Note (Signed)
   Lab Results  Component Value Date   WBC 8.7 10/20/2015   HGB 11.3 (L) 10/20/2015   HCT 35.2 (L) 10/20/2015   MCV 81.4 10/20/2015   PLT 424.0 (H) 10/20/2015   Lab Results  Component Value Date   IRON 29 (L) 10/20/2015   TIBC 505 (H) 10/20/2015   FERRITIN 10.0 10/20/2015   Iron deficient.  No prior colonoscopy.  Referral to [facility] for evaluation

## 2015-10-26 NOTE — Telephone Encounter (Signed)
Referral to [facility] in process

## 2015-10-26 NOTE — Addendum Note (Signed)
Addended by: Crecencio Mc on: 10/26/2015 05:14 PM   Modules accepted: Orders

## 2015-11-04 ENCOUNTER — Other Ambulatory Visit: Payer: Self-pay | Admitting: *Deleted

## 2015-11-04 DIAGNOSIS — Z760 Encounter for issue of repeat prescription: Secondary | ICD-10-CM

## 2015-11-04 NOTE — Telephone Encounter (Signed)
Received fax refill request for ALPRAZolam Duanne Moron) 0.5 MG tablet from [place].  Last OV 08/27/15... Last refill 08/27/15 #30 with 2 refills... Okay to refill?

## 2015-11-05 MED ORDER — ALPRAZOLAM 0.5 MG PO TABS: 0.5000 mg | ORAL_TABLET | Freq: Every evening | ORAL | 2 refills | Status: DC | PRN

## 2015-11-05 NOTE — Telephone Encounter (Signed)
Phoned-In Rx to [phone]

## 2015-12-06 ENCOUNTER — Telehealth: Payer: Self-pay | Admitting: Internal Medicine

## 2015-12-06 ENCOUNTER — Encounter: Payer: Self-pay | Admitting: Internal Medicine

## 2015-12-06 DIAGNOSIS — M4120 Other idiopathic scoliosis, site unspecified: Secondary | ICD-10-CM | POA: Insufficient documentation

## 2015-12-06 NOTE — Telephone Encounter (Signed)
When Katherine Jimenez dropped off the long term disability form,  Did she schedule an appointment/  I cannot do the form without a recent appt.  Last Her last appt in May was not about her disabiity

## 2015-12-07 NOTE — Telephone Encounter (Signed)
Patient going to call insurance she stated she not need the paper due to insurance has denied her claim, if needed patient will call office and schedule.

## 2015-12-20 ENCOUNTER — Other Ambulatory Visit: Payer: Self-pay | Admitting: *Deleted

## 2015-12-20 MED ORDER — CYANOCOBALAMIN 1000 MCG/ML IJ SOLN: 1000.0000 ug | INTRAMUSCULAR | 11 refills | Status: DC

## 2015-12-20 NOTE — Progress Notes (Unsigned)
Patient changed Pharmacy new script sent.

## 2016-01-13 ENCOUNTER — Ambulatory Visit: Payer: Managed Care, Other (non HMO) | Admitting: Internal Medicine

## 2016-01-18 LAB — HM COLONOSCOPY

## 2016-01-21 ENCOUNTER — Other Ambulatory Visit: Payer: Self-pay | Admitting: Internal Medicine

## 2016-01-21 ENCOUNTER — Ambulatory Visit
Admission: RE | Admit: 2016-01-21 | Discharge: 2016-01-21 | Disposition: A | Discharge status: Home / Self Care | Payer: Managed Care, Other (non HMO) | Source: Ambulatory Visit | Attending: Internal Medicine | Admitting: Internal Medicine

## 2016-01-21 DIAGNOSIS — Z1231 Encounter for screening mammogram for malignant neoplasm of breast: Secondary | ICD-10-CM | POA: Insufficient documentation

## 2016-01-21 DIAGNOSIS — Z1239 Encounter for other screening for malignant neoplasm of breast: Secondary | ICD-10-CM

## 2016-01-21 LAB — HM MAMMOGRAPHY

## 2016-01-24 ENCOUNTER — Other Ambulatory Visit: Payer: Self-pay | Admitting: Internal Medicine

## 2016-01-25 LAB — HM MAMMOGRAPHY

## 2016-02-01 ENCOUNTER — Ambulatory Visit: Payer: Self-pay | Admitting: Physician Assistant

## 2016-02-01 ENCOUNTER — Encounter: Payer: Self-pay | Admitting: Physician Assistant

## 2016-02-01 VITALS — BP 120/70 | HR 104 | Temp 99.2°F

## 2016-02-01 DIAGNOSIS — M898X9 Other specified disorders of bone, unspecified site: Secondary | ICD-10-CM

## 2016-02-01 DIAGNOSIS — Z299 Encounter for prophylactic measures, unspecified: Secondary | ICD-10-CM

## 2016-02-01 LAB — POCT URINALYSIS DIPSTICK
Bilirubin, UA: NEGATIVE
Glucose, UA: NEGATIVE
Ketones, UA: NEGATIVE
Leukocytes, UA: NEGATIVE
Nitrite, UA: NEGATIVE
Protein, UA: NEGATIVE
Spec Grav, UA: 1.01
Urobilinogen, UA: 0.2
pH, UA: 7

## 2016-02-01 NOTE — Progress Notes (Signed)
S: c/o joints and bones hurting, hurts in legs, hands, back, states had a procedure done that was suppose to help with pain but was mainly for nerve pain and it didn't help, ?if steroids would help?, had a fall about a year ago, was treated for that Denies numbness, tingling, or changes in bowel habits  O: vitals wnl, nad, lumbar area and knuckles tender b/l, all of knuckles are swollen on both hands, no redness noted, n/v intact  A: joint pain, ?RA  P: f/u with pcp, would ask to see a rheumatologist even if RA factor is negative

## 2016-02-17 ENCOUNTER — Ambulatory Visit (INDEPENDENT_AMBULATORY_CARE_PROVIDER_SITE_OTHER): Payer: Managed Care, Other (non HMO) | Admitting: Internal Medicine

## 2016-02-17 ENCOUNTER — Encounter: Payer: Self-pay | Admitting: Internal Medicine

## 2016-02-17 VITALS — BP 134/88 | HR 97 | Temp 98.4°F | Resp 12 | Ht 62.0 in | Wt 155.8 lb

## 2016-02-17 DIAGNOSIS — D508 Other iron deficiency anemias: Secondary | ICD-10-CM | POA: Diagnosis not present

## 2016-02-17 DIAGNOSIS — I10 Essential (primary) hypertension: Secondary | ICD-10-CM

## 2016-02-17 DIAGNOSIS — E119 Type 2 diabetes mellitus without complications: Secondary | ICD-10-CM

## 2016-02-17 DIAGNOSIS — M130 Polyarthritis, unspecified: Secondary | ICD-10-CM | POA: Diagnosis not present

## 2016-02-17 DIAGNOSIS — E7800 Pure hypercholesterolemia, unspecified: Secondary | ICD-10-CM

## 2016-02-17 DIAGNOSIS — E559 Vitamin D deficiency, unspecified: Secondary | ICD-10-CM

## 2016-02-17 DIAGNOSIS — M48061 Spinal stenosis, lumbar region without neurogenic claudication: Secondary | ICD-10-CM

## 2016-02-17 MED ORDER — CELECOXIB 200 MG PO CAPS: 200.0000 mg | ORAL_CAPSULE | Freq: Two times a day (BID) | ORAL | 1 refills | Status: DC

## 2016-02-17 NOTE — Assessment & Plan Note (Signed)
Colonoscopy was negative.  recommended once daily iron supplements for IDA ,  Repeat studeis in 8 weeks

## 2016-02-17 NOTE — Assessment & Plan Note (Signed)
Her A1c has risen from 6.6 to 7.4.  She has  increased her metformin to 850 mg bid. I have recommended stricter adherence to  a low glycemic index diet and regular exercise a minimum of 5 days per week. Return in [place] for fasting labs and A1c .   Lab Results  Component Value Date   HGBA1C 7.4 (H) 08/27/2015    Lab Results  Component Value Date   MICROALBUR 0.8 04/22/2015
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
## 2016-02-17 NOTE — Assessment & Plan Note (Signed)
  Based on current lipid profile, the risk of clinically significant CAD is 14% over the next 10 years, using the Framingham risk calculator. TPatient has declined statin therapy in favor of dietary modification and will retrun in January for fasting lipids .  Lab Results  Component Value Date   CHOL 254 (H) 08/27/2015   HDL 87.00 08/27/2015   LDLCALC 142 (H) 08/27/2015   LDLDIRECT 129.0 08/27/2015   TRIG 126.0 08/27/2015   CHOLHDL 3 08/27/2015

## 2016-02-17 NOTE — Assessment & Plan Note (Signed)
Mildly elevated.  aske dto check BP at home on and off of NSAIDs.    Lab Results  Component Value Date   CREATININE 0.78 08/27/2015   Lab Results  Component Value Date   NA 139 08/27/2015   K 4.2 08/27/2015   CL 103 08/27/2015   CO2 26 08/27/2015   Lab Results  Component Value Date   MICROALBUR 0.8 04/22/2015

## 2016-02-17 NOTE — Assessment & Plan Note (Signed)
Has failed conservative therapy including spinal cord stimulator.  Wants to avoid narcotics and has failed trials of tramadol, gabapentin and lyrica.  Continue tylenol,  Trial of celebrex 200 mg daily instead of naproxen

## 2016-02-17 NOTE — Progress Notes (Signed)
Pre-visit discussion using our clinic review tool. No additional management support is needed unless otherwise documented below in the visit note.  

## 2016-02-17 NOTE — Patient Instructions (Addendum)
Start once daily iron supplement with a meal,  Add colace if constipatiion become an issue  2000 IUs of D3 daily for winter months  Trial of celebrex instead of naproxesn,  Once daily,  Continue tylenol  BP is elevated.  Recheck and if > 120/70,  Suspend celebrex for 72 hours and recheck  Return on or after jan 2 for  Fasting labs and OV a day or two later

## 2016-02-26 ENCOUNTER — Encounter: Payer: Self-pay | Admitting: Emergency Medicine

## 2016-02-26 ENCOUNTER — Emergency Department
Admission: EM | Admit: 2016-02-26 | Discharge: 2016-02-26 | Disposition: A | Discharge status: Home / Self Care | Payer: Managed Care, Other (non HMO) | Attending: Student in an Organized Health Care Education/Training Program | Admitting: Student in an Organized Health Care Education/Training Program

## 2016-02-26 ENCOUNTER — Emergency Department: Payer: Managed Care, Other (non HMO)

## 2016-02-26 DIAGNOSIS — Y939 Activity, unspecified: Secondary | ICD-10-CM | POA: Insufficient documentation

## 2016-02-26 DIAGNOSIS — Z7984 Long term (current) use of oral hypoglycemic drugs: Secondary | ICD-10-CM | POA: Insufficient documentation

## 2016-02-26 DIAGNOSIS — E785 Hyperlipidemia, unspecified: Secondary | ICD-10-CM | POA: Diagnosis not present

## 2016-02-26 DIAGNOSIS — S300XXA Contusion of lower back and pelvis, initial encounter: Secondary | ICD-10-CM | POA: Diagnosis not present

## 2016-02-26 DIAGNOSIS — Z79899 Other long term (current) drug therapy: Secondary | ICD-10-CM | POA: Insufficient documentation

## 2016-02-26 DIAGNOSIS — E119 Type 2 diabetes mellitus without complications: Secondary | ICD-10-CM | POA: Insufficient documentation

## 2016-02-26 DIAGNOSIS — Y929 Unspecified place or not applicable: Secondary | ICD-10-CM | POA: Diagnosis not present

## 2016-02-26 DIAGNOSIS — Z85828 Personal history of other malignant neoplasm of skin: Secondary | ICD-10-CM | POA: Insufficient documentation

## 2016-02-26 DIAGNOSIS — I10 Essential (primary) hypertension: Secondary | ICD-10-CM | POA: Insufficient documentation

## 2016-02-26 DIAGNOSIS — F319 Bipolar disorder, unspecified: Secondary | ICD-10-CM | POA: Diagnosis not present

## 2016-02-26 DIAGNOSIS — Y999 Unspecified external cause status: Secondary | ICD-10-CM | POA: Insufficient documentation

## 2016-02-26 DIAGNOSIS — W108XXA Fall (on) (from) other stairs and steps, initial encounter: Secondary | ICD-10-CM | POA: Diagnosis not present

## 2016-02-26 DIAGNOSIS — S3992XA Unspecified injury of lower back, initial encounter: Secondary | ICD-10-CM | POA: Diagnosis present

## 2016-02-26 MED ORDER — OXYCODONE-ACETAMINOPHEN 5-325 MG PO TABS: 1.0000 | ORAL_TABLET | Freq: Four times a day (QID) | ORAL | 0 refills | Status: DC | PRN

## 2016-02-26 NOTE — ED Triage Notes (Signed)
Fell off stepstool 2 days ago, back pain.

## 2016-02-26 NOTE — ED Provider Notes (Signed)
[facility] Emergency Department Provider Note   ____________________________________________   None    (approximate)  I have reviewed the triage vital signs and the nursing notes.   HISTORY  Chief Complaint Back Pain    HPI Katherine Jimenez is a 65 y.o. female patient complaining of back pain secondary to fall off a step stool 2 days ago. Patient stated pain is mostly constant in in the low back and coccyx area. Patient states she has to use a pillow to sit 2 L pain in the coccyx area. Patient denies any radicular component to her back pain. She denies any loss of bladder or bowel functions. Patient is rating the pain as a 7/10. Patient described a pain as "sharp".Patient has a history of chronic back pain. No palliative measures for her complaint.   Past Medical History:  Diagnosis Date  . Arthritis    back, knees, fingers  . Bipolar disorder ([place])   . Bunion, left   . Depression   . Diabetes mellitus    diet controlled.   . Hypertension   . Lower back pain    S/P fall  . Squamous cell skin cancer, face    [facility]  . Treadmill stress test negative for angina pectoris June 2013    Patient Active Problem List   Diagnosis Date Noted  . Idiopathic scoliosis 12/06/2015  . Frequent nocturnal awakening 08/29/2015  . Chest pain 08/29/2015  . Insomnia secondary to depression with anxiety 05/29/2015  . Palpitations 03/02/2015  . Fibromyalgia 01/30/2015  . B12 deficiency 12/23/2014  . Diabetes mellitus without complication ([place]) 89/21/1940  . Cystocele 08/24/2013  . Visit for preventive health examination 01/21/2013  . Overweight (BMI 25.0-29.9) 01/21/2013  . Hyperlipidemia 02/28/2012  . Angina at rest [facility]) 12/19/2011  . Degenerative lumbar spinal stenosis 09/24/2011  . Hypertension 08/31/2011  . Bipolar disorder ([place]) 08/31/2011  . Anemia 08/31/2011    Past Surgical History:  Procedure Laterality Date  . BLADDER AUGMENTATION    .  CARDIAC CATHETERIZATION  03/05/12   no stents  . CATARACT EXTRACTION W/PHACO Right 07/19/2015   Procedure: CATARACT EXTRACTION PHACO AND INTRAOCULAR LENS PLACEMENT ([place]) right eye;  Surgeon: Ronnell Freshwater, MD;  Location: [place];  Service: Ophthalmology;  Laterality: Right;  BORDERLINE DIABETIC - oral meds  . JOINT REPLACEMENT  2012   bilateral hip  . KNEE ARTHROSCOPY    . TOTAL HIP ARTHROPLASTY  2012  . TUBAL LIGATION    . VAGINAL DELIVERY     2    Prior to Admission medications   Medication Sig Start Date End Date Taking? Authorizing Provider  acetaminophen (TYLENOL) 325 MG tablet Take 650 mg by mouth every 6 (six) hours as needed.    Historical Provider, MD  ALPRAZolam Duanne Moron) 0.5 MG tablet Take 1 tablet (0.5 mg total) by mouth at bedtime as needed. 11/05/15   Crecencio Mc, MD  Biotin (BIOTIN 5000) 5 MG CAPS Take 1 capsule by mouth daily.    Historical Provider, MD  blood glucose meter kit and supplies Dispense Contour next bayer meter. E11.9 To check blood glucose  Once a day. 08/31/15   Crecencio Mc, MD  celecoxib (CELEBREX) 200 MG capsule Take 1 capsule (200 mg total) by mouth 2 (two) times daily. 02/17/16   Crecencio Mc, MD  Cholecalciferol (VITAMIN D3) 1000 units CAPS Take 1 capsule by mouth daily.    Historical Provider, MD  cyanocobalamin (,VITAMIN B-12,) 1000 MCG/ML  injection Inject 1 mL (1,000 mcg total) into the muscle once a week. X 3, then weekly x 3, then monthly thereafter 12/20/15   Crecencio Mc, MD  glucose blood (FREESTYLE LITE) test strip Use once daily  as instructed to test blood sugar E 11.9 04/29/14   Crecencio Mc, MD  lamoTRIgine (LAMICTAL) 200 MG tablet TAKE 1 TABLET BY MOUTH TWICE DAILY 10/27/15   Crecencio Mc, MD  Lancets (FREESTYLE) lancets Use once daily to check blood sugars  E11.9 04/29/14   Crecencio Mc, MD  losartan (COZAAR) 50 MG tablet TAKE 1 TABLET (50 MG TOTAL) BY MOUTH DAILY. 09/08/15   Crecencio Mc, MD  Melatonin 5 MG TABS  Take 20 mg by mouth at bedtime.    Historical Provider, MD  metFORMIN (GLUCOPHAGE) 850 MG tablet Take 1 tablet (850 mg total) by mouth 2 (two) times daily with a meal. 08/29/15   Crecencio Mc, MD  naproxen (NAPROSYN) 250 MG tablet Take 250 mg by mouth 2 (two) times daily with a meal.    Historical Provider, MD  omeprazole (PRILOSEC) 40 MG capsule TAKE 1 CAPSULE BY MOUTH DAILY 01/24/16   Crecencio Mc, MD  oxyCODONE-acetaminophen (ROXICET) 5-325 MG tablet Take 1 tablet by mouth every 6 (six) hours as needed. 02/26/16 02/25/17  Sable Feil, PA-C  Syringe, Disposable, 1 ML MISC For use with B12 injections 12/23/14   Crecencio Mc, MD  traZODone (DESYREL) 50 MG tablet TAKE 1/2-1 TABLET BY MOUTH EVERY EVENINGAT BEDTIME AS NEEDED FOR SLEEP 01/24/16   Crecencio Mc, MD    Allergies Thimerosal  Family History  Problem Relation Age of Onset  . Heart disease Mother   . Stroke Mother   . Heart disease Father   . Heart disease Maternal Grandmother   . Heart disease Maternal Grandfather   . Heart disease Paternal Grandmother   . Heart disease Paternal Grandfather   . Breast cancer Maternal Aunt     Social History Social History  Substance Use Topics  . Smoking status: Never Smoker  . Smokeless tobacco: Never Used  . Alcohol use 0.6 oz/week    1 Cans of beer per week     Comment: occassionally    Review of Systems Constitutional: No fever/chills Eyes: No visual changes. ENT: No sore throat. Cardiovascular: Denies chest pain. Respiratory: Denies shortness of breath. Gastrointestinal: No abdominal pain.  No nausea, no vomiting.  No diarrhea.  No constipation. Genitourinary: Negative for dysuria. Musculoskeletal: Chronic back pain  Skin: Negative for rash. Neurological: Negative for headaches, focal weakness or numbness. Psychiatric:Bipolar Endocrine:Hypertension, diabetes, and hyperlipidemia. Allergic/Immunilogical: See medication  list.  ____________________________________________   PHYSICAL EXAM:  VITAL SIGNS: ED Triage Vitals  Enc Vitals Group     BP 02/26/16 1154 (!) 145/80     Pulse Rate 02/26/16 1154 88     Resp 02/26/16 1154 18     Temp 02/26/16 1154 99.4 F (37.4 C)     Temp Source 02/26/16 1154 Oral     SpO2 02/26/16 1154 95 %     Weight 02/26/16 1155 156 lb (70.8 kg)     Height 02/26/16 1155 5' (1.524 m)     Head Circumference --      Peak Flow --      Pain Score 02/26/16 1155 7     Pain Loc --      Pain Edu? --      Excl. in [place]? --  Constitutional: Alert and oriented. Well appearing and in no acute distress. Eyes: Conjunctivae are normal. PERRL. EOMI. Head: Atraumatic. Nose: No congestion/rhinnorhea. Mouth/Throat: Mucous membranes are moist.  Oropharynx non-erythematous. Neck: No stridor.  No cervical spine tenderness to palpation. Hematological/Lymphatic/Immunilogical: No cervical lymphadenopathy. Cardiovascular: Normal rate, regular rhythm. Grossly normal heart sounds.  Good peripheral circulation. Respiratory: Normal respiratory effort.  No retractions. Lungs CTAB. Gastrointestinal: Soft and nontender. No distention. No abdominal bruits. No CVA tenderness. Musculoskeletal: Patient has mild scoliosis to the mid thoracic area of the spine. Patient has some moderate guarding palpation of L4-S1. Patient has negative straight leg test. Neurologic:  Normal speech and language. No gross focal neurologic deficits are appreciated. No gait instability. Skin:  Skin is warm, dry and intact. No rash noted. Psychiatric: Mood and affect are normal. Speech and behavior are normal.  ____________________________________________   LABS (all labs ordered are listed, but only abnormal results are displayed)  Labs Reviewed - No data to display ____________________________________________  EKG   ____________________________________________  RADIOLOGY  No acute findings on x-ray of the lumbar  spine. ____________________________________________   PROCEDURES  Procedure(s) performed: None  Procedures  Critical Care performed: No  ____________________________________________   INITIAL IMPRESSION / ASSESSMENT AND PLAN / ED COURSE  Pertinent labs & imaging results that were available during my care of the patient were reviewed by me and considered in my medical decision making (see chart for details).  Coccyx contusion. Patient given discharge care instructions. Patient given a prescription for Percocets. Patient advised follow-up family doctor for continued care.  Clinical Course      ____________________________________________   FINAL CLINICAL IMPRESSION(S) / ED DIAGNOSES  Final diagnoses:  Coccyx contusion, initial encounter      NEW MEDICATIONS STARTED DURING THIS VISIT:  New Prescriptions   OXYCODONE-ACETAMINOPHEN (ROXICET) 5-325 MG TABLET    Take 1 tablet by mouth every 6 (six) hours as needed.     Note:  This document was prepared using Dragon voice recognition software and may include unintentional dictation errors.    Sable Feil, PA-C 02/26/16 1332    Merlyn Lot, MD 02/26/16 [phone]

## 2016-02-29 ENCOUNTER — Telehealth: Payer: Self-pay | Admitting: Internal Medicine

## 2016-02-29 NOTE — Telephone Encounter (Signed)
Left message to call on voicemail  

## 2016-02-29 NOTE — Telephone Encounter (Signed)
Patient states she was seen in ER on Saturday due to bruised tailbone pain is radiating down right leg  rates pain 7/10 applying ice/heat .  Given script for Percocet helping some with pain .   Told to follow-up with PCP if no better .  Please advise.

## 2016-02-29 NOTE — Telephone Encounter (Signed)
Pt called and stated that she fell on her back on Thursday and went to the ER on Saturday had xrays done and said that she had a bruised tailbone. They also gave her some pain medication. Pt is not feeling any better and would like to know what to do next.  Call pt @ [phone]

## 2016-02-29 NOTE — Telephone Encounter (Signed)
NEEDS APPOINTMENT 

## 2016-03-01 ENCOUNTER — Ambulatory Visit: Payer: Self-pay | Admitting: Physician Assistant

## 2016-03-01 VITALS — BP 119/60 | HR 98 | Temp 99.5°F

## 2016-03-01 DIAGNOSIS — J069 Acute upper respiratory infection, unspecified: Secondary | ICD-10-CM

## 2016-03-01 DIAGNOSIS — M549 Dorsalgia, unspecified: Secondary | ICD-10-CM

## 2016-03-01 LAB — POCT INFLUENZA A/B
Influenza A, POC: NEGATIVE
Influenza B, POC: NEGATIVE

## 2016-03-01 MED ORDER — CEFDINIR 300 MG PO CAPS: 300.0000 mg | ORAL_CAPSULE | Freq: Two times a day (BID) | ORAL | 0 refills | Status: DC

## 2016-03-01 MED ORDER — METHYLPREDNISOLONE 4 MG PO TBPK: ORAL_TABLET | ORAL | 0 refills | Status: DC

## 2016-03-01 NOTE — Progress Notes (Signed)
S: C/o runny nose and congestion for 3 days, no fever, chills, cp/sob, v/d; mucus was green this am but clear throughout the day, cough is sporadic, some body aches, also fell last week while putting up xmas decorations, was on a step stool and fell about 2 feet onto her back, went to the ER, xrays were normal, given percocet but its not helping the pain, ? If hairline fx or something else wrong  Using otc meds:  O: PE: vitals w low grade temp, perrl eomi, normocephalic, tms dull, nasal mucosa red and swollen, throat injected, neck supple no lymph, lungs c t a, cv rrr, neuro intact, lumbar spine and sacrum a little tender, pt able to walk without difficulty; flu swab neg  A:  Acute  Uri, acute back pain  P: drink fluids, continue regular meds , use otc meds of choice, return if not improving in 5 days, return earlier if worsening , medrol dose pack, omnicef, f/u with pcp, if not improving with steroids then consider MRI of spine to determine if hairline fx

## 2016-03-01 NOTE — Telephone Encounter (Signed)
Appointment scheduled.

## 2016-03-01 NOTE — Telephone Encounter (Signed)
Please advise as to where to schedule. Thank you!  Call pt @ [phone]

## 2016-03-09 ENCOUNTER — Ambulatory Visit
Admission: RE | Admit: 2016-03-09 | Discharge: 2016-03-09 | Disposition: A | Discharge status: Home / Self Care | Payer: Managed Care, Other (non HMO) | Source: Ambulatory Visit | Attending: Physician Assistant | Admitting: Physician Assistant

## 2016-03-09 ENCOUNTER — Encounter: Payer: Self-pay | Admitting: Physician Assistant

## 2016-03-09 ENCOUNTER — Other Ambulatory Visit: Payer: Self-pay | Admitting: Internal Medicine

## 2016-03-09 ENCOUNTER — Ambulatory Visit: Payer: Self-pay | Admitting: Physician Assistant

## 2016-03-09 VITALS — BP 130/70 | HR 110 | Temp 99.2°F

## 2016-03-09 DIAGNOSIS — R05 Cough: Secondary | ICD-10-CM | POA: Diagnosis present

## 2016-03-09 DIAGNOSIS — J209 Acute bronchitis, unspecified: Secondary | ICD-10-CM

## 2016-03-09 DIAGNOSIS — M419 Scoliosis, unspecified: Secondary | ICD-10-CM | POA: Diagnosis not present

## 2016-03-09 DIAGNOSIS — Z760 Encounter for issue of repeat prescription: Secondary | ICD-10-CM

## 2016-03-09 DIAGNOSIS — M47815 Spondylosis without myelopathy or radiculopathy, thoracolumbar region: Secondary | ICD-10-CM | POA: Diagnosis not present

## 2016-03-09 MED ORDER — ALBUTEROL SULFATE HFA 108 (90 BASE) MCG/ACT IN AERS: 2.0000 | INHALATION_SPRAY | Freq: Four times a day (QID) | RESPIRATORY_TRACT | 0 refills | Status: DC | PRN

## 2016-03-09 MED ORDER — FLUTICASONE-SALMETEROL 115-21 MCG/ACT IN AERO: 2.0000 | INHALATION_SPRAY | Freq: Two times a day (BID) | RESPIRATORY_TRACT | 12 refills | Status: DC

## 2016-03-09 MED ORDER — PREDNISONE 10 MG (21) PO TBPK
10.0000 mg | ORAL_TABLET | Freq: Every day | ORAL | 0 refills | Status: DC
Start: 2016-03-09 — End: 2016-03-10

## 2016-03-09 NOTE — Addendum Note (Signed)
Addended by: Versie Starks on: 03/09/2016 03:09 PM   Modules accepted: Orders

## 2016-03-09 NOTE — Telephone Encounter (Signed)
Last OV was 02/17/16 and last refill was 8/4/2017with #30 and 2 refills, please advise for refill? thanks

## 2016-03-09 NOTE — Telephone Encounter (Signed)
Last filled 01/31/16. Last OV 02/17/16 and has another OV tomorrow 03/10/16.

## 2016-03-09 NOTE — Telephone Encounter (Signed)
Pt called and needs a refill on her ALPRAZolam (XANAX) 0.5 MG tablet. Thank you!  [place], [place]  Call pt @ [phone]

## 2016-03-09 NOTE — Progress Notes (Signed)
S: pt states she is wheezing a lot and feels like she is getting worse, sx for 12 days, been on antibiotic for 9, didn't see a big difference while on steroids, still running low grade temp of 99., no swelling in feet ankles, just really tired and fatigued; no head congestion,  has an appt with her pcp tomorrow for recheck of her back secondary to a fall  O: vitals w elevated pulse, pulse ox at 95% ra wnl, ENT wnl, neck supple no lymph, lungs c diffuse wheezing in lower lung fields b/l, cv sinus tachy  A: acute bronchitis  P: cxr as pt not responding well to medication, will decide on whether to give another antibiotic or more steroids after xray results, [facility]

## 2016-03-10 ENCOUNTER — Ambulatory Visit (INDEPENDENT_AMBULATORY_CARE_PROVIDER_SITE_OTHER): Payer: Managed Care, Other (non HMO) | Admitting: Internal Medicine

## 2016-03-10 ENCOUNTER — Encounter: Payer: Self-pay | Admitting: Internal Medicine

## 2016-03-10 ENCOUNTER — Telehealth: Payer: Self-pay

## 2016-03-10 VITALS — BP 146/88 | HR 95 | Temp 98.6°F | Ht 60.0 in | Wt 154.6 lb

## 2016-03-10 DIAGNOSIS — S300XXD Contusion of lower back and pelvis, subsequent encounter: Secondary | ICD-10-CM

## 2016-03-10 DIAGNOSIS — Z760 Encounter for issue of repeat prescription: Secondary | ICD-10-CM

## 2016-03-10 DIAGNOSIS — D508 Other iron deficiency anemias: Secondary | ICD-10-CM

## 2016-03-10 DIAGNOSIS — F5105 Insomnia due to other mental disorder: Secondary | ICD-10-CM

## 2016-03-10 DIAGNOSIS — J206 Acute bronchitis due to rhinovirus: Secondary | ICD-10-CM

## 2016-03-10 DIAGNOSIS — F418 Other specified anxiety disorders: Secondary | ICD-10-CM

## 2016-03-10 DIAGNOSIS — M48061 Spinal stenosis, lumbar region without neurogenic claudication: Secondary | ICD-10-CM

## 2016-03-10 MED ORDER — PREDNISONE 10 MG (21) PO TBPK: ORAL_TABLET | ORAL | 0 refills | Status: DC

## 2016-03-10 MED ORDER — ALPRAZOLAM 0.5 MG PO TABS: 0.5000 mg | ORAL_TABLET | Freq: Every evening | ORAL | 5 refills | Status: DC | PRN

## 2016-03-10 MED ORDER — ETODOLAC ER 600 MG PO TB24: 600.0000 mg | ORAL_TABLET | Freq: Every day | ORAL | 2 refills | Status: DC

## 2016-03-10 MED ORDER — ETODOLAC ER 400 MG PO TB24: 400.0000 mg | ORAL_TABLET | Freq: Every day | ORAL | 3 refills | Status: DC

## 2016-03-10 NOTE — Telephone Encounter (Signed)
Spoke wth the pharmacist and they have avaliable in XR 400 mg 1 daily or 400 mg 1 q 6-8 tid or bid.

## 2016-03-10 NOTE — Telephone Encounter (Signed)
XL 400 MG SENT

## 2016-03-10 NOTE — Telephone Encounter (Signed)
Do they have the once daily formulation or the twice daily?

## 2016-03-10 NOTE — Telephone Encounter (Signed)
Please find out what dose and formuulation of etodolac they DO  have. Thanks !

## 2016-03-10 NOTE — Patient Instructions (Addendum)
Check your blood pressure a few times away form the office,  And increase your losartan to 100 mg daily if BP is persistently > 120/70   Repeat the roundn of prednisone  60 mg daily x 3 days,  Then taper by 10 mg daily until gone   Start the lodine XL 600 mg daily AFTER you   finish the  prednisone   I will refill your alprazolam today   Your labs are ordered.  You can make a lab appointment today

## 2016-03-10 NOTE — Progress Notes (Signed)
Pre visit review using our clinic review tool, if applicable. No additional management support is needed unless otherwise documented below in the visit note. 

## 2016-03-10 NOTE — Telephone Encounter (Signed)
Unable to order Lodine XL  600 mg , they've called around to other pharmacies and they're  not able to get for patient.   Please advise.

## 2016-03-10 NOTE — Progress Notes (Addendum)
Subjective:  Patient ID: Katherine Jimenez, female    DOB: 1951/01/04  Age: 65 y.o. MRN: [id number]  CC: The primary encounter diagnosis was Iron deficiency anemia secondary to inadequate dietary iron intake. Diagnoses of Medication refill, Acute bronchitis due to Rhinovirus, Insomnia secondary to depression with anxiety, Degenerative lumbar spinal stenosis, and Coccygeal contusion, subsequent encounter were also pertinent to this visit.  HPI Katherine Jimenez presents for persistent low back and coccygeal pain following a fall that occurred at home.  While decorating her house for Christmas she fell off  a step school.   Treated in th ER on Nov 25 For coccyx bone  contusion and prescribed percocet, which she has stopped using since it did not improve her pain.  She was already taking celebrex,   Lumbar spine films were negative for acute fractures. No pelvic films done. Pain is , however, slowly improving and is only present when she changes from sitting to standing .  Her back pain is tolerable in the morning but worse by afternoon.  She has been Swimming for exercise.  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   She was also  treated for persistent  bronchitis twice during the past week by  Ilean Skill at  [facility] .  Had been taking medrol dose pack and omnicef since Nov 29 , with no significant improvement.  Chest x ray was clear.   Given beta agonist MDI   Outpatient Medications Prior to Visit  Medication Sig Dispense Refill  . acetaminophen (TYLENOL) 325 MG tablet Take 650 mg by mouth every 6 (six) hours as needed.    Marland Kitchen albuterol (PROVENTIL HFA;VENTOLIN HFA) 108 (90 Base) MCG/ACT inhaler Inhale 2 puffs into the lungs every 6 (six) hours as needed for wheezing or shortness of breath. 1 Inhaler 0  . Biotin (BIOTIN 5000) 5 MG CAPS Take 1  capsule by mouth daily.    . blood glucose meter kit and supplies Dispense Contour next bayer meter. E11.9 To check blood glucose  Once a day. 1 each 0  . cefdinir (OMNICEF) 300 MG capsule Take 1 capsule (300 mg total) by mouth 2 (two) times daily. 20 capsule 0  . celecoxib (CELEBREX) 200 MG capsule Take 1 capsule (200 mg total) by mouth 2 (two) times daily. 30 capsule 1  . Cholecalciferol (VITAMIN D3) 1000 units CAPS Take 1 capsule by mouth daily.    . cyanocobalamin (,VITAMIN B-12,) 1000 MCG/ML injection Inject 1 mL (1,000 mcg total) into the muscle once a week. X 3, then weekly x 3, then monthly thereafter 10 mL 11  . fluticasone-salmeterol (ADVAIR HFA) 115-21 MCG/ACT inhaler Inhale 2 puffs into the lungs 2 (two) times daily. 1 Inhaler 12  . glucose blood (FREESTYLE LITE) test strip Use once daily  as instructed to test blood sugar E 11.9 100 each 3  . lamoTRIgine (LAMICTAL) 200 MG tablet TAKE 1 TABLET BY MOUTH TWICE DAILY 180 tablet 1  . Lancets (FREESTYLE) lancets Use once daily to check blood sugars  E11.9 100 each 3  . losartan (COZAAR) 50 MG tablet TAKE 1 TABLET (50 MG TOTAL) BY MOUTH DAILY. 90 tablet 1  . Melatonin 5 MG TABS Take 20 mg by mouth at bedtime.    . metFORMIN (GLUCOPHAGE) 850 MG tablet Take 1 tablet (850 mg total) by  mouth 2 (two) times daily with a meal. 180 tablet 2  . methylPREDNISolone (MEDROL DOSEPAK) 4 MG TBPK tablet Take 6 pills on day one then decrease by 1 pill each day 21 tablet 0  . naproxen (NAPROSYN) 250 MG tablet Take 250 mg by mouth 2 (two) times daily with a meal.    . omeprazole (PRILOSEC) 40 MG capsule TAKE 1 CAPSULE BY MOUTH DAILY 90 capsule 1  . oxyCODONE-acetaminophen (ROXICET) 5-325 MG tablet Take 1 tablet by mouth every 6 (six) hours as needed. 20 tablet 0  . Syringe, Disposable, 1 ML MISC For use with B12 injections 25 each 3  . traZODone (DESYREL) 50 MG tablet TAKE 1/2-1 TABLET BY MOUTH EVERY EVENINGAT BEDTIME AS NEEDED FOR SLEEP 30 tablet 3  .  ALPRAZolam (XANAX) 0.5 MG tablet Take 1 tablet (0.5 mg total) by mouth at bedtime as needed. 30 tablet 2  . predniSONE (STERAPRED UNI-PAK 21 TAB) 10 MG (21) TBPK tablet Take 1 tablet (10 mg total) by mouth daily. 21 tablet 0   No facility-administered medications prior to visit.     Review of Systems;  Patient denies headache, fevers, malaise, unintentional weight loss, skin rash, eye pain, sinus congestion and sinus pain, sore throat, dysphagia,  hemoptysis ,dyspnea, , chest pain, palpitations, orthopnea, edema, abdominal pain, nausea, melena, diarrhea, constipation, flank pain, dysuria, hematuria, urinary  Frequency, nocturia, numbness, tingling, seizures,  Focal weakness, Loss of consciousness,  Tremor, insomnia, depression, anxiety, and suicidal ideation.      Objective:    BP (!) 146/88   Pulse 95   Temp 98.6 F (37 C) (Oral)   Ht 5' (1.524 m)   Wt 154 lb 9.6 oz (70.1 kg)   SpO2 98%   BMI 30.19 kg/m    BP Readings from Last 3 Encounters:  03/10/16 (!) 146/88  03/09/16 130/70  03/01/16 119/60    Wt Readings from Last 3 Encounters:  03/10/16 154 lb 9.6 oz (70.1 kg)  02/26/16 156 lb (70.8 kg)  02/17/16 155 lb 12 oz (70.6 kg)    General appearance: alert, cooperative and appears stated age Ears: normal TM's and external ear canals both ears Throat: lips, mucosa, and tongue normal; teeth and gums normal Neck: no adenopathy, no carotid bruit, supple, symmetrical, trachea midline and thyroid not enlarged, symmetric, no tenderness/mass/nodules Back: symmetric, no curvature. ROM normal. No CVA or vertebral  tenderness. Lungs: clear to auscultation bilaterally, no wheezing  Heart: regular rate and rhythm, S1, S2 normal, no murmur, click, rub or gallop Abdomen: soft, non-tender; bowel sounds normal; no masses,  no organomegaly Pulses: 2+ and symmetric Skin: Skin color, texture, turgor normal. No rashes or lesions Lymph nodes: Cervical, supraclavicular, and axillary nodes  normal.  Lab Results  Component Value Date   HGBA1C 7.4 (H) 08/27/2015   HGBA1C 6.6 (H) 04/22/2015   HGBA1C 6.4 06/10/2014    Lab Results  Component Value Date   CREATININE 0.78 08/27/2015   CREATININE 0.86 04/22/2015   CREATININE 0.76 06/10/2014    Lab Results  Component Value Date   WBC 8.7 10/20/2015   HGB 11.3 (L) 10/20/2015   HCT 35.2 (L) 10/20/2015   PLT 424.0 (H) 10/20/2015   GLUCOSE 120 (H) 08/27/2015   CHOL 254 (H) 08/27/2015   TRIG 126.0 08/27/2015   HDL 87.00 08/27/2015   LDLDIRECT 129.0 08/27/2015   LDLCALC 142 (H) 08/27/2015   ALT 15 08/27/2015   AST 10 08/27/2015   NA 139 08/27/2015   K 4.2 08/27/2015  CL 103 08/27/2015   CREATININE 0.78 08/27/2015   BUN 10 08/27/2015   CO2 26 08/27/2015   TSH 0.525 10/20/2015   INR 1.2 (H) 10/20/2015   HGBA1C 7.4 (H) 08/27/2015   MICROALBUR 0.8 04/22/2015    Dg Chest 2 View  Result Date: 03/09/2016 CLINICAL DATA:  Cough, congestion and wheezing for 12 days EXAM: CHEST  2 VIEW COMPARISON:  02/27/2012 FINDINGS: The heart size and mediastinal contours are within normal limits. Both lungs are clear. The visualized skeletal structures are unremarkable. Chronic stable by shaped scoliosis of the dorsal spine with stable multilevel degenerative disc space narrowing and endplate spurring. Mid cervical spine facet hypertrophy and spurring is also noted. IMPRESSION: No active cardiopulmonary disease. Thoracolumbar spondylosis with S-shaped scoliosis unchanged in appearance. Electronically Signed   By: Ashley Royalty M.D.   On: 03/09/2016 14:31    Assessment & Plan:   Problem List Items Addressed This Visit    Acute bronchitis    Reviewed prior x ray, She is still  wheezing but not  short of breath on exam. Will repeat prednisone taper.      Anemia - Primary   Relevant Orders   Ferritin   Coccygeal contusion, subsequent encounter    Secondary to fall, improving gradually      Degenerative lumbar spinal stenosis    Has  failed conservative therapy including ESI's and  spinal cord stimulator.  Wants to avoid narcotics and has failed trials of tramadol, gabapentin and lyrica.  Continue tylenol,  Trial of celebrex 200 mg daily instead of naproxen. 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  Alprazolam refilled       Other Visit Diagnoses    Medication refill       Relevant Medications   ALPRAZolam (XANAX) 0.5 MG tablet      I am having Katherine Jimenez maintain her naproxen, acetaminophen, glucose blood, freestyle, Syringe (Disposable), Melatonin, metFORMIN, blood glucose meter kit and supplies, losartan, lamoTRIgine, cyanocobalamin, traZODone, omeprazole, Vitamin D3, Biotin, celecoxib, oxyCODONE-acetaminophen, cefdinir, methylPREDNISolone, fluticasone-salmeterol, albuterol, ALPRAZolam, and predniSONE.  Meds ordered this encounter  Medications  . DISCONTD: predniSONE (STERAPRED UNI-PAK 21 TAB) 10 MG (21) TBPK tablet    Sig: 6 tablets all at once on Days 1 2, and 3  Then taper by 1 tablet daily until gone    Dispense:  33 tablet    Refill:  0  . DISCONTD: etodolac (LODINE XL) 600 MG 24 hr tablet    Sig: Take 1 tablet (600 mg total) by mouth daily.    Dispense:  30 tablet    Refill:  2  . ALPRAZolam (XANAX) 0.5 MG tablet    Sig: Take 1 tablet (0.5 mg total) by mouth at bedtime as needed.    Dispense:  30 tablet    Refill:  5  . predniSONE (STERAPRED UNI-PAK 21 TAB) 10 MG (21) TBPK tablet    Sig: 6 tablets all at once on Days 1 2, and 3  Then taper by 1 tablet daily until gone    Dispense:  33 tablet    Refill:  0    Medications Discontinued During This Encounter  Medication Reason  . predniSONE (STERAPRED UNI-PAK 21 TAB) 10 MG (21) TBPK tablet Reorder  . ALPRAZolam (XANAX) 0.5 MG tablet Reorder  . predniSONE (STERAPRED UNI-PAK 21 TAB) 10 MG (  21) TBPK tablet Reorder     Follow-up: No Follow-up on file.   Crecencio Mc, MD

## 2016-03-10 NOTE — Telephone Encounter (Signed)
Has been done! 

## 2016-03-10 NOTE — Telephone Encounter (Signed)
Spoke with [facility] and they have Etodolac 300 mg and 400 mg available. Please advise.

## 2016-03-14 ENCOUNTER — Other Ambulatory Visit: Payer: Self-pay

## 2016-03-14 DIAGNOSIS — S300XXD Contusion of lower back and pelvis, subsequent encounter: Secondary | ICD-10-CM | POA: Insufficient documentation

## 2016-03-14 DIAGNOSIS — J209 Acute bronchitis, unspecified: Secondary | ICD-10-CM | POA: Insufficient documentation

## 2016-03-14 NOTE — Assessment & Plan Note (Addendum)
Reviewed prior x ray, She is still  wheezing but not  short of breath on exam. Will repeat prednisone taper.

## 2016-03-14 NOTE — Telephone Encounter (Signed)
Sent already on the 8th per the chart.

## 2016-03-14 NOTE — Assessment & Plan Note (Signed)
Improved with addition of trazodone,  No changes today .  Alprazolam refilled

## 2016-03-14 NOTE — Assessment & Plan Note (Signed)
Secondary to fall, improving gradually

## 2016-03-14 NOTE — Assessment & Plan Note (Addendum)
Has failed conservative therapy including ESI's and  spinal cord stimulator.  Wants to avoid narcotics and has failed trials of tramadol, gabapentin and lyrica.  Continue tylenol,  Trial of celebrex 200 mg daily instead of naproxen. 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

## 2016-03-16 ENCOUNTER — Telehealth: Payer: Self-pay | Admitting: Internal Medicine

## 2016-03-16 DIAGNOSIS — E663 Overweight: Secondary | ICD-10-CM

## 2016-03-16 NOTE — Telephone Encounter (Signed)
The previous [employer] disability form fro last year that was scanned into the chart is BLANK.  I need patient to bring me a copy of the last one that was filled out before I  Can do the current one

## 2016-03-17 NOTE — Telephone Encounter (Signed)
Have placed last FMLA and [facility] physicians statement with current form in red folder.

## 2016-03-19 NOTE — Telephone Encounter (Signed)
[employer] disability form completed and in red folder   .  The charge is $50

## 2016-03-21 DIAGNOSIS — Z7689 Persons encountering health services in other specified circumstances: Secondary | ICD-10-CM

## 2016-03-21 NOTE — Telephone Encounter (Signed)
It's really not necessary,  Remind her again that it was  just checked in July and thyroid was normal

## 2016-03-21 NOTE — Telephone Encounter (Signed)
Notified patient paperwork ready patient requested to have a TSH drawn with labs due to losing hair and becoming brittle ok to add TSH? Pended lab

## 2016-03-21 NOTE — Telephone Encounter (Signed)
Left message fo r patient to return call to  Office. 

## 2016-03-21 NOTE — Telephone Encounter (Signed)
Copy made for billing and for chart. Patient was notified she needs to come and complete patient portion so form can be sent to medical records . Placed  Original at front desk.

## 2016-03-29 ENCOUNTER — Other Ambulatory Visit (INDEPENDENT_AMBULATORY_CARE_PROVIDER_SITE_OTHER): Payer: Managed Care, Other (non HMO)

## 2016-03-29 DIAGNOSIS — I10 Essential (primary) hypertension: Secondary | ICD-10-CM | POA: Diagnosis not present

## 2016-03-29 DIAGNOSIS — E559 Vitamin D deficiency, unspecified: Secondary | ICD-10-CM

## 2016-03-29 DIAGNOSIS — E119 Type 2 diabetes mellitus without complications: Secondary | ICD-10-CM

## 2016-03-29 DIAGNOSIS — D508 Other iron deficiency anemias: Secondary | ICD-10-CM | POA: Diagnosis not present

## 2016-03-29 DIAGNOSIS — M130 Polyarthritis, unspecified: Secondary | ICD-10-CM | POA: Diagnosis not present

## 2016-03-29 LAB — HEMOGLOBIN A1C: Hgb A1c MFr Bld: 6.5 % (ref 4.6–6.5)

## 2016-03-29 LAB — CBC WITH DIFFERENTIAL/PLATELET
Basophils Absolute: 0 10*3/uL (ref 0.0–0.1)
Basophils Relative: 0.8 % (ref 0.0–3.0)
Eosinophils Absolute: 0.2 10*3/uL (ref 0.0–0.7)
Eosinophils Relative: 4.5 % (ref 0.0–5.0)
HCT: 38.3 % (ref 36.0–46.0)
Hemoglobin: 13 g/dL (ref 12.0–15.0)
Lymphocytes Relative: 40.1 % (ref 12.0–46.0)
Lymphs Abs: 2.1 10*3/uL (ref 0.7–4.0)
MCHC: 34 g/dL (ref 30.0–36.0)
MCV: 87.7 fl (ref 78.0–100.0)
Monocytes Absolute: 0.5 10*3/uL (ref 0.1–1.0)
Monocytes Relative: 9.1 % (ref 3.0–12.0)
Neutro Abs: 2.4 10*3/uL (ref 1.4–7.7)
Neutrophils Relative %: 45.5 % (ref 43.0–77.0)
Platelets: 337 10*3/uL (ref 150.0–400.0)
RBC: 4.37 Mil/uL (ref 3.87–5.11)
RDW: 14.5 % (ref 11.5–15.5)
WBC: 5.3 10*3/uL (ref 4.0–10.5)

## 2016-03-29 LAB — LIPID PANEL
Cholesterol: 234 mg/dL — ABNORMAL HIGH (ref 0–200)
HDL: 102.8 mg/dL (ref >39.00)
LDL Cholesterol: 110 mg/dL — ABNORMAL HIGH (ref 0–99)
NonHDL: 131.5
Total CHOL/HDL Ratio: 2
Triglycerides: 108 mg/dL (ref 0.0–149.0)
VLDL: 21.6 mg/dL (ref 0.0–40.0)

## 2016-03-29 LAB — URIC ACID: Uric Acid, Serum: 3.5 mg/dL (ref 2.4–7.0)

## 2016-03-29 LAB — SEDIMENTATION RATE: Sed Rate: 29 mm/hr (ref 0–30)

## 2016-03-29 LAB — COMPREHENSIVE METABOLIC PANEL
ALT: 18 U/L (ref 0–35)
AST: 14 U/L (ref 0–37)
Albumin: 4.3 g/dL (ref 3.5–5.2)
Alkaline Phosphatase: 102 U/L (ref 39–117)
BUN: 12 mg/dL (ref 6–23)
CO2: 27 mEq/L (ref 19–32)
Calcium: 9.4 mg/dL (ref 8.4–10.5)
Chloride: 103 mEq/L (ref 96–112)
Creatinine, Ser: 0.78 mg/dL (ref 0.40–1.20)
GFR: 79 mL/min (ref >60.00)
Glucose, Bld: 138 mg/dL — ABNORMAL HIGH (ref 70–99)
Potassium: 4.3 mEq/L (ref 3.5–5.1)
Sodium: 140 mEq/L (ref 135–145)
Total Bilirubin: 0.4 mg/dL (ref 0.2–1.2)
Total Protein: 6.7 g/dL (ref 6.0–8.3)

## 2016-03-29 LAB — IRON AND TIBC
%SAT: 10 % — ABNORMAL LOW (ref 11–50)
Iron: 38 ug/dL — ABNORMAL LOW (ref 45–160)
TIBC: 397 ug/dL (ref 250–450)
UIBC: 359 ug/dL (ref 125–400)

## 2016-03-29 LAB — MICROALBUMIN / CREATININE URINE RATIO
Creatinine,U: 179.6 mg/dL
Microalb Creat Ratio: 0.4 mg/g (ref 0.0–30.0)
Microalb, Ur: 0.8 mg/dL (ref 0.0–1.9)

## 2016-03-29 LAB — C-REACTIVE PROTEIN: CRP: 1.4 mg/dL (ref 0.5–20.0)

## 2016-03-29 LAB — VITAMIN D 25 HYDROXY (VIT D DEFICIENCY, FRACTURES): VITD: 25.03 ng/mL — ABNORMAL LOW (ref 30.00–100.00)

## 2016-03-29 LAB — FERRITIN: Ferritin: 15.7 ng/mL (ref 10.0–291.0)

## 2016-03-30 LAB — RHEUMATOID FACTOR: Rhuematoid fact SerPl-aCnc: <14 IU/mL (ref <14)

## 2016-03-30 LAB — CYCLIC CITRUL PEPTIDE ANTIBODY, IGG: Cyclic Citrullin Peptide Ab: <16 Units

## 2016-03-30 LAB — ANA W/REFLEX IF POSITIVE: Anti Nuclear Antibody(ANA): NEGATIVE

## 2016-04-05 ENCOUNTER — Encounter: Payer: Self-pay | Admitting: Internal Medicine

## 2016-04-05 LAB — HLA-B27 ANTIGEN: DNA Result:: NEGATIVE

## 2016-04-10 ENCOUNTER — Ambulatory Visit (INDEPENDENT_AMBULATORY_CARE_PROVIDER_SITE_OTHER): Payer: Managed Care, Other (non HMO) | Admitting: Internal Medicine

## 2016-04-10 ENCOUNTER — Encounter: Payer: Self-pay | Admitting: Internal Medicine

## 2016-04-10 DIAGNOSIS — M48061 Spinal stenosis, lumbar region without neurogenic claudication: Secondary | ICD-10-CM

## 2016-04-10 DIAGNOSIS — E119 Type 2 diabetes mellitus without complications: Secondary | ICD-10-CM | POA: Diagnosis not present

## 2016-04-10 MED ORDER — PREDNISONE 10 MG PO TABS: ORAL_TABLET | ORAL | 0 refills | Status: DC

## 2016-04-10 MED ORDER — TRAMADOL HCL 50 MG PO TABS: 50.0000 mg | ORAL_TABLET | Freq: Three times a day (TID) | ORAL | 5 refills | Status: DC | PRN

## 2016-04-10 NOTE — Patient Instructions (Signed)
If you did not try the celebrex as your NSAID, try usig it BID for one week,  tehn QD  If this is not an option ,   Try the prednisone taper. If it helps,  Rheumatology referral for help in managing  Try using 1000 mg tylenol and 50 mg tramadol at bedtime

## 2016-04-10 NOTE — Progress Notes (Signed)
Subjective:  Patient ID: Katherine Jimenez, female    DOB: 17-Jun-1950  Age: 66 y.o. MRN: [id number]  CC: Diagnoses of Degenerative lumbar spinal stenosis and Diabetes mellitus without complication ([place]) were pertinent to this visit.  HPI Katherine Jimenez presents for follow up on  multiple issues including type 2 DM,  Chronic low back pain  and new onset groin pain.    She has had no improvement in her chronic low back pain.  Her pain has been  interrupting her sleep at night.  She has developed bilateral medial thigh pain, as well as diffuse pain involving the joints in both hands.  Recent serologic work up has failed to reveal any type of inflammatory or autoimmune etiology .   She is using tylenol , but is not sire whether celebrex prescribed at last visit was tried or effective. She has stopped exercising (swimming) due to the frigid weather.      Outpatient Medications Prior to Visit  Medication Sig Dispense Refill  . acetaminophen (TYLENOL) 325 MG tablet Take 650 mg by mouth every 6 (six) hours as needed.    . ALPRAZolam (XANAX) 0.5 MG tablet Take 1 tablet (0.5 mg total) by mouth at bedtime as needed. 30 tablet 5  . Biotin (BIOTIN 5000) 5 MG CAPS Take 1 capsule by mouth daily.    . blood glucose meter kit and supplies Dispense Contour next bayer meter. E11.9 To check blood glucose  Once a day. 1 each 0  . Cholecalciferol (VITAMIN D3) 1000 units CAPS Take 1 capsule by mouth daily.    . cyanocobalamin (,VITAMIN B-12,) 1000 MCG/ML injection Inject 1 mL (1,000 mcg total) into the muscle once a week. X 3, then weekly x 3, then monthly thereafter 10 mL 11  . glucose blood (FREESTYLE LITE) test strip Use once daily  as instructed to test blood sugar E 11.9 100 each 3  . lamoTRIgine (LAMICTAL) 200 MG tablet TAKE 1 TABLET BY MOUTH TWICE DAILY 180 tablet 1  . Lancets (FREESTYLE) lancets Use once daily to check blood sugars  E11.9 100 each 3  . losartan (COZAAR) 50 MG tablet TAKE 1 TABLET (50 MG  TOTAL) BY MOUTH DAILY. 90 tablet 1  . Melatonin 5 MG TABS Take 20 mg by mouth at bedtime.    . metFORMIN (GLUCOPHAGE) 850 MG tablet Take 1 tablet (850 mg total) by mouth 2 (two) times daily with a meal. 180 tablet 2  . naproxen (NAPROSYN) 250 MG tablet Take 250 mg by mouth 2 (two) times daily with a meal.    . omeprazole (PRILOSEC) 40 MG capsule TAKE 1 CAPSULE BY MOUTH DAILY 90 capsule 1  . Syringe, Disposable, 1 ML MISC For use with B12 injections 25 each 3  . albuterol (PROVENTIL HFA;VENTOLIN HFA) 108 (90 Base) MCG/ACT inhaler Inhale 2 puffs into the lungs every 6 (six) hours as needed for wheezing or shortness of breath. 1 Inhaler 0  . cefdinir (OMNICEF) 300 MG capsule Take 1 capsule (300 mg total) by mouth 2 (two) times daily. 20 capsule 0  . celecoxib (CELEBREX) 200 MG capsule Take 1 capsule (200 mg total) by mouth 2 (two) times daily. 30 capsule 1  . etodolac (LODINE XL) 400 MG 24 hr tablet Take 1 tablet (400 mg total) by mouth daily. 30 tablet 3  . fluticasone-salmeterol (ADVAIR HFA) 115-21 MCG/ACT inhaler Inhale 2 puffs into the lungs 2 (two) times daily. 1 Inhaler 12  . methylPREDNISolone (MEDROL DOSEPAK) 4 MG  TBPK tablet Take 6 pills on day one then decrease by 1 pill each day 21 tablet 0  . oxyCODONE-acetaminophen (ROXICET) 5-325 MG tablet Take 1 tablet by mouth every 6 (six) hours as needed. 20 tablet 0  . predniSONE (STERAPRED UNI-PAK 21 TAB) 10 MG (21) TBPK tablet 6 tablets all at once on Days 1 2, and 3  Then taper by 1 tablet daily until gone 33 tablet 0  . traZODone (DESYREL) 50 MG tablet TAKE 1/2-1 TABLET BY MOUTH EVERY EVENINGAT BEDTIME AS NEEDED FOR SLEEP 30 tablet 3   No facility-administered medications prior to visit.     Review of Systems;  Patient denies headache, fevers, malaise, unintentional weight loss, skin rash, eye pain, sinus congestion and sinus pain, sore throat, dysphagia,  hemoptysis , cough, dyspnea, wheezing, chest pain, palpitations, orthopnea, edema,  abdominal pain, nausea, melena, diarrhea, constipation, flank pain, dysuria, hematuria, urinary  Frequency, nocturia, numbness, tingling, seizures,  Focal weakness, Loss of consciousness,  Tremor, insomnia, depression, anxiety, and suicidal ideation.      Objective:  BP 126/82   Pulse (!) 101   Temp 98.4 F (36.9 C) (Oral)   Resp 16   Ht 5' (1.524 m)   Wt 157 lb 6 oz (71.4 kg)   SpO2 95%   BMI 30.74 kg/m   BP Readings from Last 3 Encounters:  04/10/16 126/82  03/10/16 (!) 146/88  03/09/16 130/70    Wt Readings from Last 3 Encounters:  04/10/16 157 lb 6 oz (71.4 kg)  03/10/16 154 lb 9.6 oz (70.1 kg)  02/26/16 156 lb (70.8 kg)    General appearance: alert, cooperative and appears stated age Ears: normal TM's and external ear canals both ears Throat: lips, mucosa, and tongue normal; teeth and gums normal Neck: no adenopathy, no carotid bruit, supple, symmetrical, trachea midline and thyroid not enlarged, symmetric, no tenderness/mass/nodules Back: symmetric, no curvature. ROM normal. No CVA tenderness. Lungs: clear to auscultation bilaterally Heart: regular rate and rhythm, S1, S2 normal, no murmur, click, rub or gallop Abdomen: soft, non-tender; bowel sounds normal; no masses,  no organomegaly Pulses: 2+ and symmetric Skin: Skin color, texture, turgor normal. No rashes or lesions Lymph nodes: Cervical, supraclavicular, and axillary nodes normal. MSK: no evidence of synovitis affecting hands elbows or knees.   Lab Results  Component Value Date   HGBA1C 6.5 03/29/2016   HGBA1C 7.4 (H) 08/27/2015   HGBA1C 6.6 (H) 04/22/2015    Lab Results  Component Value Date   CREATININE 0.78 03/29/2016   CREATININE 0.78 08/27/2015   CREATININE 0.86 04/22/2015    Lab Results  Component Value Date   WBC 5.3 03/29/2016   HGB 13.0 03/29/2016   HCT 38.3 03/29/2016   PLT 337.0 03/29/2016   GLUCOSE 138 (H) 03/29/2016   CHOL 234 (H) 03/29/2016   TRIG 108.0 03/29/2016   HDL 102.80  03/29/2016   LDLDIRECT 129.0 08/27/2015   LDLCALC 110 (H) 03/29/2016   ALT 18 03/29/2016   AST 14 03/29/2016   NA 140 03/29/2016   K 4.3 03/29/2016   CL 103 03/29/2016   CREATININE 0.78 03/29/2016   BUN 12 03/29/2016   CO2 27 03/29/2016   TSH 0.525 10/20/2015   INR 1.2 (H) 10/20/2015   HGBA1C 6.5 03/29/2016   MICROALBUR 0.8 03/29/2016    Dg Chest 2 View  Result Date: 03/09/2016 CLINICAL DATA:  Cough, congestion and wheezing for 12 days EXAM: CHEST  2 VIEW COMPARISON:  02/27/2012 FINDINGS: The heart size and mediastinal  contours are within normal limits. Both lungs are clear. The visualized skeletal structures are unremarkable. Chronic stable by shaped scoliosis of the dorsal spine with stable multilevel degenerative disc space narrowing and endplate spurring. Mid cervical spine facet hypertrophy and spurring is also noted. IMPRESSION: No active cardiopulmonary disease. Thoracolumbar spondylosis with S-shaped scoliosis unchanged in appearance. Electronically Signed   By: Ashley Royalty M.D.   On: 03/09/2016 14:31    Assessment & Plan:   Problem List Items Addressed This Visit    Degenerative lumbar spinal stenosis    Attempts to control and relieve her pain have failed conservative therapy including ESI's and  spinal cord stimulator.  She continues to have daily disabling pain and prefers to  avoid narcotics , but has failed trials of tramadol, gabapentin and lyrica.  Continue tylenol,  Trial of celebrex 200 mg bid/daily and tramadol at night only. 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   Lab Results  Component Value Date   HGBA1C 6.5 03/29/2016    Lab Results  Component Value Date   MICROALBUR 0.8 03/29/2016               A total of 25 minutes of  face to face time was spent with patient more than half of which was spent in counselling about the above mentioned conditions  and coordination of care  I have discontinued Ms. Kinoshita's traZODone, celecoxib, oxyCODONE-acetaminophen, cefdinir, methylPREDNISolone, fluticasone-salmeterol, albuterol, predniSONE, and etodolac. I am also having her start on predniSONE and traMADol. Additionally, I am having her maintain her naproxen, acetaminophen, glucose blood, freestyle, Syringe (Disposable), Melatonin, metFORMIN, blood glucose meter kit and supplies, losartan, lamoTRIgine, cyanocobalamin, omeprazole, Vitamin D3, Biotin, ALPRAZolam, Ferrous Gluconate-C-Folic Acid (IRON-C PO), and vitamin E.  Meds ordered this encounter  Medications  . Ferrous Gluconate-C-Folic Acid (IRON-C PO)    Sig: Take 1 tablet by mouth daily.  . vitamin E 1000 UNIT capsule    Sig: Take 1,000 Units by mouth daily.  . predniSONE (DELTASONE) 10 MG tablet    Sig: 6 tablets daily for three days , then reduce by 1 tablet daily until gone    Dispense:  33 tablet    Refill:  0  . traMADol (ULTRAM) 50 MG tablet    Sig: Take 1 tablet (50 mg total) by mouth every 8 (eight) hours as needed.    Dispense:  30 tablet    Refill:  5    Medications Discontinued During This Encounter  Medication Reason  . albuterol (PROVENTIL HFA;VENTOLIN HFA) 108 (90 Base) MCG/ACT inhaler No longer needed (for PRN medications)  . cefdinir (OMNICEF) 300 MG capsule Completed Course  . celecoxib (CELEBREX) 200 MG capsule Patient has not taken in last 30 days  . etodolac (LODINE XL) 400 MG 24 hr tablet Completed Course  . fluticasone-salmeterol (ADVAIR HFA) 115-21 MCG/ACT inhaler Patient Discharge  . methylPREDNISolone (MEDROL DOSEPAK) 4 MG TBPK tablet Completed Course  . oxyCODONE-acetaminophen (ROXICET) 5-325 MG tablet No longer needed (for PRN medications)  . predniSONE (STERAPRED UNI-PAK 21 TAB) 10 MG (21) TBPK tablet Completed Course  . traZODone  (DESYREL) 50 MG tablet Patient Discharge    Follow-up: Return in about 3 months (around 07/09/2016) for follow up diabetes, pain /OA.  Crecencio Mc, MD

## 2016-04-10 NOTE — Progress Notes (Signed)
Pre-visit discussion using our clinic review tool. No additional management support is needed unless otherwise documented below in the visit note.  

## 2016-04-11 ENCOUNTER — Encounter: Payer: Self-pay | Admitting: Internal Medicine

## 2016-04-11 NOTE — Assessment & Plan Note (Signed)
Attempts to control and relieve her pain have failed conservative therapy including ESI's and  spinal cord stimulator.  She continues to have daily disabling pain and prefers to  avoid narcotics , but has failed trials of tramadol, gabapentin and lyrica.  Continue tylenol,  Trial of celebrex 200 mg bid/daily and tramadol at night only. 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

## 2016-04-11 NOTE — Assessment & Plan Note (Signed)
Well controlled since she  increased her metformin to 850 mg bid.ontinue low glycemic index diet and resume regular exercise a minimum of 5 days per week.   Lab Results  Component Value Date   HGBA1C 6.5 03/29/2016    Lab Results  Component Value Date   MICROALBUR 0.8 03/29/2016

## 2016-04-24 ENCOUNTER — Other Ambulatory Visit: Payer: Self-pay | Admitting: Internal Medicine

## 2016-04-24 DIAGNOSIS — Z0279 Encounter for issue of other medical certificate: Secondary | ICD-10-CM

## 2016-06-06 ENCOUNTER — Ambulatory Visit: Payer: Self-pay | Admitting: Physician Assistant

## 2016-06-20 ENCOUNTER — Other Ambulatory Visit: Payer: Self-pay | Admitting: Internal Medicine

## 2016-08-01 ENCOUNTER — Other Ambulatory Visit: Payer: Self-pay | Admitting: Internal Medicine

## 2016-08-01 DIAGNOSIS — Z760 Encounter for issue of repeat prescription: Secondary | ICD-10-CM

## 2016-08-14 ENCOUNTER — Telehealth: Payer: Self-pay | Admitting: *Deleted

## 2016-08-14 NOTE — Telephone Encounter (Addendum)
Pt states she would like to know about the laser for toenail fungus.09/11/2016-Pt states she has an appt the end of June to be evaluated for Laser, could she get a pedicare prior to the appt. I spoke with Gretta Arab, RN, she states pt needs to remove polish prior to office visit. I informed pt of Gretta Arab, RN statement.

## 2016-09-05 ENCOUNTER — Ambulatory Visit: Payer: Managed Care, Other (non HMO) | Admitting: Podiatry

## 2016-09-08 ENCOUNTER — Other Ambulatory Visit: Payer: Self-pay | Admitting: Internal Medicine

## 2016-09-12 ENCOUNTER — Encounter: Payer: Self-pay | Admitting: Internal Medicine

## 2016-09-12 ENCOUNTER — Ambulatory Visit (INDEPENDENT_AMBULATORY_CARE_PROVIDER_SITE_OTHER): Payer: Managed Care, Other (non HMO) | Admitting: Internal Medicine

## 2016-09-12 ENCOUNTER — Telehealth: Payer: Self-pay | Admitting: Internal Medicine

## 2016-09-12 DIAGNOSIS — M48061 Spinal stenosis, lumbar region without neurogenic claudication: Secondary | ICD-10-CM

## 2016-09-12 DIAGNOSIS — I10 Essential (primary) hypertension: Secondary | ICD-10-CM | POA: Diagnosis not present

## 2016-09-12 DIAGNOSIS — E119 Type 2 diabetes mellitus without complications: Secondary | ICD-10-CM

## 2016-09-12 DIAGNOSIS — F418 Other specified anxiety disorders: Secondary | ICD-10-CM

## 2016-09-12 DIAGNOSIS — F5105 Insomnia due to other mental disorder: Secondary | ICD-10-CM | POA: Diagnosis not present

## 2016-09-12 DIAGNOSIS — E7800 Pure hypercholesterolemia, unspecified: Secondary | ICD-10-CM

## 2016-09-12 MED ORDER — DULOXETINE HCL 30 MG PO CPEP: 30.0000 mg | ORAL_CAPSULE | Freq: Every day | ORAL | 1 refills | Status: DC

## 2016-09-12 NOTE — Telephone Encounter (Signed)
Dr.Tullo patient

## 2016-09-12 NOTE — Patient Instructions (Signed)
I am refilling  The cymbalta to 30 mg for 90 days.  Let me know if you increased to dose to 60 mg after two weeks

## 2016-09-12 NOTE — Assessment & Plan Note (Signed)
Improved with use of alprazolam  Did not tolerateodone,  No changes today .  Alprazolam refilled

## 2016-09-12 NOTE — Assessment & Plan Note (Signed)
Well controlled on current regimen. Renal function stable, no changes today. Lab Results  Component Value Date   CREATININE 0.78 03/29/2016

## 2016-09-12 NOTE — Telephone Encounter (Signed)
Pt called and is asking to have a refill on her cymbalta. Please advise, [phone].

## 2016-09-12 NOTE — Assessment & Plan Note (Signed)
Historically well controlled since she  increased her metformin to 850 mg bid.  Advised to continue low glycemic index diet and resume regular exercise a minimum of 5 days per week return for fasting labs.  Lab Results  Component Value Date   HGBA1C 6.5 03/29/2016    Lab Results  Component Value Date   MICROALBUR 0.8 03/29/2016

## 2016-09-12 NOTE — Telephone Encounter (Signed)
Has to be seen to resume an anti depressant.  She is due for diabetes follow up and needs labs prior to visit,  Which have been ordered

## 2016-09-12 NOTE — Telephone Encounter (Signed)
Spoke with pt and she has been scheduled today for 1:00pm with Dr. Derrel Nip for the cymbalta. Pt has a follow up appt for diabetes scheduled on July 31st.

## 2016-09-12 NOTE — Telephone Encounter (Signed)
Looking in the pt's chart it doesn't lkook like this pt is still taking this medication. According the the medication history list the cymbalta has not been refilled since 2016. Please advise.

## 2016-09-12 NOTE — Assessment & Plan Note (Signed)
Attempts to control and relieve her pain have failed conservative therapy including ESI's and  spinal cord stimulator.  She continues to have daily disabling pain and prefers to  avoid narcotics , but has resorted to using tramadol at night only.  She  has resume cymbalta with mild improventt in symptoms.  Refills given . Continue tylenol,  Naproxen and tramadol at night only. 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

## 2016-09-12 NOTE — Telephone Encounter (Signed)
LMTCB
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
## 2016-09-25 ENCOUNTER — Other Ambulatory Visit: Payer: Self-pay | Admitting: Internal Medicine

## 2016-09-26 ENCOUNTER — Encounter: Payer: Self-pay | Admitting: Podiatry

## 2016-09-26 ENCOUNTER — Ambulatory Visit (INDEPENDENT_AMBULATORY_CARE_PROVIDER_SITE_OTHER): Payer: Managed Care, Other (non HMO) | Admitting: Podiatry

## 2016-09-26 DIAGNOSIS — B351 Tinea unguium: Secondary | ICD-10-CM

## 2016-09-27 NOTE — Progress Notes (Signed)
   Subjective: Patient presents today for possible treatment and evaluation of fungal nails bilaterally 1 through 5. Patient states that the nails have been discolored and thickened for greater than one year. She states she has tried Lamisil and other oral antifungal medications. She is interested in laser treatments. She had a nail biopsy done 1 year ago. Patient presents today for further treatment and evaluation.  Objective: Physical Exam General: The patient is alert and oriented x3 in no acute distress.  Dermatology: Hyperkeratotic, discolored, thickened, onychodystrophy of nails noted bilaterally.  Skin is warm, dry and supple bilateral lower extremities. Negative for open lesions or macerations.  Vascular: Palpable pedal pulses bilaterally. No edema or erythema noted. Capillary refill within normal limits.  Neurological: Epicritic and protective threshold grossly intact bilaterally.   Musculoskeletal Exam: Range of motion within normal limits to all pedal and ankle joints bilateral. Muscle strength 5/5 in all groups bilateral.   Assessment: #1 onychodystrophy bilateral toenails #2 possible onychomycosis #3 hyperkeratotic nails bilateral  Plan of Care:  #1 Patient was evaluated. #2 discussed different treatment options. #3 patient would like to pursue laser treatment and [facility] office. #4 patient will bring nail biopsy report from dermatologist. #5 appointment with Janett Billow, RN for laser treatment. #6 return to clinic when necessary.   Edrick Kins, DPM [facility]  Dr. Edrick Kins, [address]                                        [place], [place] [address]                Office [phone]  Fax [phone]

## 2016-10-10 ENCOUNTER — Ambulatory Visit: Payer: Managed Care, Other (non HMO)

## 2016-10-10 ENCOUNTER — Encounter: Payer: Self-pay | Admitting: Physician Assistant

## 2016-10-10 ENCOUNTER — Ambulatory Visit: Payer: Self-pay | Admitting: Physician Assistant

## 2016-10-10 VITALS — BP 120/70 | HR 95 | Temp 98.5°F | Resp 16

## 2016-10-10 DIAGNOSIS — M549 Dorsalgia, unspecified: Secondary | ICD-10-CM

## 2016-10-10 NOTE — Progress Notes (Signed)
S: pt c/o sharp stabbing pain in upper back on Sunday, now is just really tired, no diaphoresis or jaw pain, did have a heart cath a few years ago which was normal, no swelling in feet or ankles, no radiation of pain, it was constant and stabbing, does have scoliosis  O: vitals wnl, nad, pt appears well, lungs c t a, cv rrr, ekg with increased hr when compared to old ekg,   A: acute upper back pain  P: refer to cardiology, labs in clinic today

## 2016-10-11 ENCOUNTER — Ambulatory Visit (INDEPENDENT_AMBULATORY_CARE_PROVIDER_SITE_OTHER): Payer: Self-pay | Admitting: Podiatry

## 2016-10-11 ENCOUNTER — Other Ambulatory Visit: Payer: Self-pay | Admitting: Physician Assistant

## 2016-10-11 DIAGNOSIS — B351 Tinea unguium: Secondary | ICD-10-CM

## 2016-10-11 LAB — CBC WITH DIFFERENTIAL/PLATELET
Basophils Absolute: 0 10*3/uL (ref 0.0–0.2)
Basos: 0 %
EOS (ABSOLUTE): 0.1 10*3/uL (ref 0.0–0.4)
Eos: 2 %
Hematocrit: 39.2 % (ref 34.0–46.6)
Hemoglobin: 13 g/dL (ref 11.1–15.9)
Immature Grans (Abs): 0.1 10*3/uL (ref 0.0–0.1)
Immature Granulocytes: 1 %
Lymphocytes Absolute: 2.5 10*3/uL (ref 0.7–3.1)
Lymphs: 35 %
MCH: 31.2 pg (ref 26.6–33.0)
MCHC: 33.2 g/dL (ref 31.5–35.7)
MCV: 94 fL (ref 79–97)
Monocytes Absolute: 0.4 10*3/uL (ref 0.1–0.9)
Monocytes: 6 %
Neutrophils Absolute: 3.9 10*3/uL (ref 1.4–7.0)
Neutrophils: 56 %
Platelets: 323 10*3/uL (ref 150–379)
RBC: 4.17 x10E6/uL (ref 3.77–5.28)
RDW: 13.9 % (ref 12.3–15.4)
WBC: 7 10*3/uL (ref 3.4–10.8)

## 2016-10-11 LAB — BASIC METABOLIC PANEL
BUN/Creatinine Ratio: 14 (ref 12–28)
BUN: 10 mg/dL (ref 8–27)
CO2: 22 mmol/L (ref 20–29)
Calcium: 9.8 mg/dL (ref 8.7–10.3)
Chloride: 97 mmol/L (ref 96–106)
Creatinine, Ser: 0.73 mg/dL (ref 0.57–1.00)
GFR calc Af Amer: 101 mL/min/{1.73_m2} (ref >59)
GFR calc non Af Amer: 87 mL/min/{1.73_m2} (ref >59)
Glucose: 109 mg/dL — ABNORMAL HIGH (ref 65–99)
Potassium: 4.6 mmol/L (ref 3.5–5.2)
Sodium: 136 mmol/L (ref 134–144)

## 2016-10-11 LAB — TROPONIN I: Troponin I: <0.01 ng/mL (ref 0.00–0.04)

## 2016-10-11 MED ORDER — BACLOFEN 10 MG PO TABS: 10.0000 mg | ORAL_TABLET | Freq: Three times a day (TID) | ORAL | 0 refills | Status: DC

## 2016-10-11 NOTE — Progress Notes (Unsigned)
Pt called office and is still having back pain, ?if we could call in a muscle relaxer, called in baclofen to [facility]

## 2016-10-11 NOTE — Progress Notes (Signed)
Sent baclofen rx to [facility]

## 2016-10-11 NOTE — Progress Notes (Signed)
Pt presents with mycotic infection of nails 1-5 bilateral  All other systems are negative  Laser therapy administered to affected nails and tolerated well. All safety precautions were in place. Re-appointed in 4 weeks for 2nd treatment 

## 2016-10-11 NOTE — Progress Notes (Signed)
Patient expressed that she had another episode of the back pain and wants to know if you can prescribe her a muscle relaxer.

## 2016-10-13 ENCOUNTER — Other Ambulatory Visit (INDEPENDENT_AMBULATORY_CARE_PROVIDER_SITE_OTHER): Payer: Managed Care, Other (non HMO)

## 2016-10-13 DIAGNOSIS — I10 Essential (primary) hypertension: Secondary | ICD-10-CM | POA: Diagnosis not present

## 2016-10-13 DIAGNOSIS — E7800 Pure hypercholesterolemia, unspecified: Secondary | ICD-10-CM | POA: Diagnosis not present

## 2016-10-13 DIAGNOSIS — E119 Type 2 diabetes mellitus without complications: Secondary | ICD-10-CM | POA: Diagnosis not present

## 2016-10-13 LAB — COMPREHENSIVE METABOLIC PANEL
ALT: 12 U/L (ref 0–35)
AST: 11 U/L (ref 0–37)
Albumin: 4.2 g/dL (ref 3.5–5.2)
Alkaline Phosphatase: 69 U/L (ref 39–117)
BUN: 16 mg/dL (ref 6–23)
CO2: 28 mEq/L (ref 19–32)
Calcium: 9.4 mg/dL (ref 8.4–10.5)
Chloride: 101 mEq/L (ref 96–112)
Creatinine, Ser: 0.84 mg/dL (ref 0.40–1.20)
GFR: 72.4 mL/min (ref >60.00)
Glucose, Bld: 112 mg/dL — ABNORMAL HIGH (ref 70–99)
Potassium: 4.2 mEq/L (ref 3.5–5.1)
Sodium: 136 mEq/L (ref 135–145)
Total Bilirubin: 0.4 mg/dL (ref 0.2–1.2)
Total Protein: 6.6 g/dL (ref 6.0–8.3)

## 2016-10-13 LAB — MICROALBUMIN / CREATININE URINE RATIO
Creatinine,U: 165.4 mg/dL
Microalb Creat Ratio: 0.4 mg/g (ref 0.0–30.0)
Microalb, Ur: <0.7 mg/dL (ref 0.0–1.9)

## 2016-10-13 LAB — LIPID PANEL
Cholesterol: 232 mg/dL — ABNORMAL HIGH (ref 0–200)
HDL: 104.2 mg/dL (ref >39.00)
LDL Cholesterol: 117 mg/dL — ABNORMAL HIGH (ref 0–99)
NonHDL: 127.73
Total CHOL/HDL Ratio: 2
Triglycerides: 56 mg/dL (ref 0.0–149.0)
VLDL: 11.2 mg/dL (ref 0.0–40.0)

## 2016-10-13 LAB — LDL CHOLESTEROL, DIRECT: Direct LDL: 112 mg/dL

## 2016-10-13 LAB — HEMOGLOBIN A1C: Hgb A1c MFr Bld: 5.9 % (ref 4.6–6.5)

## 2016-10-15 ENCOUNTER — Encounter: Payer: Self-pay | Admitting: Internal Medicine

## 2016-10-30 NOTE — Telephone Encounter (Signed)
Mailed unread message to patient. thanks 

## 2016-10-31 ENCOUNTER — Ambulatory Visit: Payer: Self-pay | Admitting: Internal Medicine

## 2016-10-31 ENCOUNTER — Other Ambulatory Visit: Payer: Self-pay | Admitting: Internal Medicine

## 2016-11-15 ENCOUNTER — Other Ambulatory Visit: Payer: Managed Care, Other (non HMO)

## 2016-11-20 ENCOUNTER — Other Ambulatory Visit: Payer: Self-pay | Admitting: Internal Medicine

## 2016-11-20 DIAGNOSIS — Z760 Encounter for issue of repeat prescription: Secondary | ICD-10-CM

## 2016-11-20 NOTE — Telephone Encounter (Signed)
Last OV 09/12/2016 Next OV 12/15/2016 Last Refill 08/03/2016

## 2016-11-20 NOTE — Telephone Encounter (Signed)
Ok to refill 

## 2016-11-21 ENCOUNTER — Other Ambulatory Visit: Payer: Managed Care, Other (non HMO)

## 2016-11-21 NOTE — Telephone Encounter (Signed)
Printed, signed and faxed.  

## 2016-11-29 ENCOUNTER — Ambulatory Visit: Payer: Self-pay | Admitting: Cardiovascular Disease

## 2016-12-05 ENCOUNTER — Ambulatory Visit: Payer: Managed Care, Other (non HMO)

## 2016-12-05 DIAGNOSIS — B351 Tinea unguium: Secondary | ICD-10-CM

## 2016-12-05 NOTE — Progress Notes (Signed)
Pt presents with mycotic infection of nails 1-5 bilateral  All other systems are negative  Laser therapy administered to affected nails and tolerated well. All safety precautions were in place. Re-appointed in 4 weeks for 3rd treatment 

## 2016-12-07 ENCOUNTER — Other Ambulatory Visit: Payer: Self-pay | Admitting: Internal Medicine

## 2016-12-15 ENCOUNTER — Ambulatory Visit: Payer: Self-pay | Admitting: Internal Medicine

## 2016-12-25 ENCOUNTER — Other Ambulatory Visit: Payer: Self-pay | Admitting: Internal Medicine

## 2016-12-27 NOTE — Telephone Encounter (Signed)
Error

## 2017-01-02 ENCOUNTER — Ambulatory Visit: Payer: Managed Care, Other (non HMO)

## 2017-01-02 DIAGNOSIS — B351 Tinea unguium: Secondary | ICD-10-CM

## 2017-01-10 ENCOUNTER — Other Ambulatory Visit: Payer: Self-pay | Admitting: Internal Medicine

## 2017-01-11 NOTE — Telephone Encounter (Signed)
Should be given an OV for December

## 2017-01-11 NOTE — Telephone Encounter (Signed)
Last OV was 6/12, LAst refill was 8/20 for #30 with 2 refills

## 2017-01-12 ENCOUNTER — Other Ambulatory Visit: Payer: Self-pay | Admitting: Internal Medicine

## 2017-01-17 ENCOUNTER — Ambulatory Visit (INDEPENDENT_AMBULATORY_CARE_PROVIDER_SITE_OTHER): Payer: Managed Care, Other (non HMO) | Admitting: Internal Medicine

## 2017-01-17 ENCOUNTER — Encounter: Payer: Self-pay | Admitting: Internal Medicine

## 2017-01-17 ENCOUNTER — Other Ambulatory Visit: Payer: Self-pay | Admitting: Internal Medicine

## 2017-01-17 VITALS — BP 150/78 | HR 89 | Temp 98.4°F | Resp 16 | Ht 60.0 in | Wt 160.8 lb

## 2017-01-17 DIAGNOSIS — I10 Essential (primary) hypertension: Secondary | ICD-10-CM | POA: Diagnosis not present

## 2017-01-17 DIAGNOSIS — M48061 Spinal stenosis, lumbar region without neurogenic claudication: Secondary | ICD-10-CM

## 2017-01-17 DIAGNOSIS — E119 Type 2 diabetes mellitus without complications: Secondary | ICD-10-CM

## 2017-01-17 DIAGNOSIS — E538 Deficiency of other specified B group vitamins: Secondary | ICD-10-CM | POA: Diagnosis not present

## 2017-01-17 DIAGNOSIS — E7800 Pure hypercholesterolemia, unspecified: Secondary | ICD-10-CM | POA: Diagnosis not present

## 2017-01-17 DIAGNOSIS — M1711 Unilateral primary osteoarthritis, right knee: Secondary | ICD-10-CM

## 2017-01-17 DIAGNOSIS — R419 Unspecified symptoms and signs involving cognitive functions and awareness: Secondary | ICD-10-CM

## 2017-01-17 DIAGNOSIS — E559 Vitamin D deficiency, unspecified: Secondary | ICD-10-CM | POA: Diagnosis not present

## 2017-01-17 DIAGNOSIS — Z1239 Encounter for other screening for malignant neoplasm of breast: Secondary | ICD-10-CM

## 2017-01-17 DIAGNOSIS — Z1231 Encounter for screening mammogram for malignant neoplasm of breast: Secondary | ICD-10-CM

## 2017-01-17 LAB — COMPREHENSIVE METABOLIC PANEL
ALT: 12 U/L (ref 0–35)
AST: 13 U/L (ref 0–37)
Albumin: 4.6 g/dL (ref 3.5–5.2)
Alkaline Phosphatase: 96 U/L (ref 39–117)
BUN: 14 mg/dL (ref 6–23)
CO2: 26 mEq/L (ref 19–32)
Calcium: 10 mg/dL (ref 8.4–10.5)
Chloride: 99 mEq/L (ref 96–112)
Creatinine, Ser: 0.74 mg/dL (ref 0.40–1.20)
GFR: 83.73 mL/min (ref >60.00)
Glucose, Bld: 95 mg/dL (ref 70–99)
Potassium: 4.5 mEq/L (ref 3.5–5.1)
Sodium: 136 mEq/L (ref 135–145)
Total Bilirubin: 0.5 mg/dL (ref 0.2–1.2)
Total Protein: 7.4 g/dL (ref 6.0–8.3)

## 2017-01-17 LAB — TSH: TSH: 1.27 u[IU]/mL (ref 0.35–4.50)

## 2017-01-17 LAB — LIPID PANEL
Cholesterol: 255 mg/dL — ABNORMAL HIGH (ref 0–200)
HDL: 109.7 mg/dL (ref >39.00)
LDL Cholesterol: 128 mg/dL — ABNORMAL HIGH (ref 0–99)
NonHDL: 145.05
Total CHOL/HDL Ratio: 2
Triglycerides: 87 mg/dL (ref 0.0–149.0)
VLDL: 17.4 mg/dL (ref 0.0–40.0)

## 2017-01-17 LAB — VITAMIN D 25 HYDROXY (VIT D DEFICIENCY, FRACTURES): VITD: 50.7 ng/mL (ref 30.00–100.00)

## 2017-01-17 LAB — HEMOGLOBIN A1C: Hgb A1c MFr Bld: 6 % (ref 4.6–6.5)

## 2017-01-17 NOTE — Progress Notes (Signed)
Subjective:  Patient ID: Katherine Jimenez, female    DOB: [birth year]  Age: 66 y.o. MRN: [id number]  CC: The primary encounter diagnosis was Pure hypercholesterolemia. Diagnoses of Essential hypertension, Diabetes mellitus without complication ([place]), G62 deficiency, Vitamin D deficiency, Cognitive complaints, Screening breast examination, Primary osteoarthritis of right knee, and Degenerative lumbar spinal stenosis were also pertinent to this visit.  HPI Katherine Jimenez presents for 3 month follow up on diabetes and chronic back pain due to spinal stenosis  Saw Krasinski for right knee pain , received I/a steroid injection  3 weeks ago,  Told she had severe DJD and needs a total knee replacement.  Has  pain with all activities.  Tried CBD oil.  Doesn't want to have a TKR due to familial responsibilities .  Has obtained guardianship of 18 yr old granddaughter. Daughter is a drug addict.  . Has already had bilateral hip replacement .discussed referral to [facility]   Approved for disability from [employer] up to 2020 .  needs to be updated  With additional health issues of  DJD knee and bladder prolpase.  Can't pick up a six pack of soda,  Can't go up stairs.   3 month follow up on diabetes.  Patient has no complaints today.  Patient is following a low glycemic index diet and taking all prescribed medications regularly without side effects.  Fasting sugars have been under less than 140 most of the time and post prandials have been under 160 except on rare occasions. Patient is not exercising due to back and knee pain  .  Patient has had an eye exam in the last 12 months and checks feet regularly for signs of infection.  Patient does not walk barefoot outside,  And denies an numbness tingling or burning in feet. Patient is up to date on all recommended vaccinations  Lab Results  Component Value Date   HGBA1C 6.0 01/17/2017    Lab Results  Component Value Date   MICROALBUR <0.7 10/13/2016    Bladder  prolapse has recurred despite the bladder tack 5 years ago by Jacqlyn Larsen .  Having urinary incontinence and constantly  feels a bulge in her crotch   Concerned about cognitive decline.  Mother died at 48 of alzheimers dementia. Not missing appts.  But trouble recalling names    USING trazodone, alprazolam, and tylenol PM nightly for INSOMNIA   Outpatient Medications Prior to Visit  Medication Sig Dispense Refill  . acetaminophen (TYLENOL) 325 MG tablet Take 650 mg by mouth every 6 (six) hours as needed.    . ALPRAZolam (XANAX) 0.5 MG tablet TAKE 1 TABLET BY MOUTH AT BEDTIME AS NEEDED 30 tablet 2  . Biotin (BIOTIN 5000) 5 MG CAPS Take 1 capsule by mouth daily.    . blood glucose meter kit and supplies Dispense Contour next bayer meter. E11.9 To check blood glucose  Once a day. 1 each 0  . Cholecalciferol (VITAMIN D3) 1000 units CAPS Take 1 capsule by mouth daily.    . cyanocobalamin (,VITAMIN B-12,) 1000 MCG/ML injection INJECT 1ML INTO THE MUSCLE ONCE A WEEK FOR 3 WEEKS, THEN MONTHLY THEREAFTER 10 mL 3  . DULoxetine (CYMBALTA) 30 MG capsule Take 1 capsule (30 mg total) by mouth daily. 90 capsule 1  . glucose blood (FREESTYLE LITE) test strip Use once daily  as instructed to test blood sugar E 11.9 100 each 3  . lamoTRIgine (LAMICTAL) 200 MG tablet TAKE ONE TABLET BY MOUTH TWICE DAILY 180  tablet 1  . Lancets (FREESTYLE) lancets Use once daily to check blood sugars  E11.9 100 each 3  . losartan (COZAAR) 50 MG tablet TAKE 1 TABLET BY MOUTH EVERY DAY 90 tablet 3  . metFORMIN (GLUCOPHAGE) 850 MG tablet TAKE 1 TABLET BY MOUTH TWICE DAILY WITH A MEAL 180 tablet 2  . naproxen (NAPROSYN) 250 MG tablet Take 250 mg by mouth 2 (two) times daily with a meal.    . omeprazole (PRILOSEC) 40 MG capsule TAKE 1 CAPSULE BY MOUTH EVERY DAY 90 capsule 1  . Syringe, Disposable, 1 ML MISC For use with B12 injections 25 each 3  . traMADol (ULTRAM) 50 MG tablet TAKE ONE TABLET BY MOUTH EVERY EIGHT HOURS AS NEEDED 30 tablet  1  . traZODone (DESYREL) 50 MG tablet     . traZODone (DESYREL) 50 MG tablet TAKE 1/2 TO 1 TABLET BY MOUTH AT BEDTIMEAS NEEDED FOR SLEEP 30 tablet 3  . vitamin E 1000 UNIT capsule Take 1,000 Units by mouth daily.    . baclofen (LIORESAL) 10 MG tablet Take 1 tablet (10 mg total) by mouth 3 (three) times daily. (Patient not taking: Reported on 01/17/2017) 30 each 0  . Ferrous Gluconate-C-Folic Acid (IRON-C PO) Take 1 tablet by mouth daily.     No facility-administered medications prior to visit.     Review of Systems;  Patient denies headache, fevers, malaise, unintentional weight loss, skin rash, eye pain, sinus congestion and sinus pain, sore throat, dysphagia,  hemoptysis , cough, dyspnea, wheezing, chest pain, palpitations, orthopnea, edema, abdominal pain, nausea, melena, diarrhea, constipation, flank pain, dysuria, hematuria, urinary  Frequency, nocturia, numbness, tingling, seizures,  Focal weakness, Loss of consciousness,  Tremor, , depression, anxiety, and suicidal ideation.      Objective:  BP (!) 150/78 (BP Location: Left Arm, Patient Position: Sitting, Cuff Size: Normal)   Pulse 89   Temp 98.4 F (36.9 C) (Oral)   Resp 16   Ht 5' (1.524 m)   Wt 160 lb 12.8 oz (72.9 kg)   SpO2 94%   BMI 31.40 kg/m   BP Readings from Last 3 Encounters:  01/17/17 (!) 150/78  10/10/16 120/70  09/12/16 110/72    Wt Readings from Last 3 Encounters:  01/17/17 160 lb 12.8 oz (72.9 kg)  09/12/16 150 lb 9.6 oz (68.3 kg)  04/10/16 157 lb 6 oz (71.4 kg)    General appearance: alert, cooperative and appears stated age Ears: normal TM's and external ear canals both ears Throat: lips, mucosa, and tongue normal; teeth and gums normal Neck: no adenopathy, no carotid bruit, supple, symmetrical, trachea midline and thyroid not enlarged, symmetric, no tenderness/mass/nodules Back: symmetric, no curvature. ROM normal. No CVA tenderness. Lungs: clear to auscultation bilaterally Heart: regular rate  and rhythm, S1, S2 normal, no murmur, click, rub or gallop Abdomen: soft, non-tender; bowel sounds normal; no masses,  no organomegaly Pulses: 2+ and symmetric Skin: Skin color, texture, turgor normal. No rashes or lesions Lymph nodes: Cervical, supraclavicular, and axillary nodes normal Neuro:  awake and interactive with normal mood and affect. Higher cortical functions are normal. Speech is clear without word-finding difficulty or dysarthria. Extraocular movements are intact. Visual fields of both eyes are grossly intact. Sensation to light touch is grossly intact bilaterally of upper and lower extremities. Motor examination shows 4+/5 symmetric hand grip and upper extremity and 5/5 lower extremity strength. There is no pronation or drift. 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  Orientation to time 4  Orientation to Place 5  Registration 3  Attention/ Calculation 4  Recall 2  Language- name 2 objects 2  Language- repeat 1  Language- follow 3 step command 3  Language- read & follow direction 1  Write a sentence 1  Copy design 1  Total score 27              Lab Results  Component Value Date   HGBA1C 6.0 01/17/2017   HGBA1C 5.9 10/13/2016   HGBA1C 6.5 03/29/2016    Lab Results  Component Value Date   CREATININE 0.74 01/17/2017   CREATININE 0.84 10/13/2016   CREATININE 0.73 10/10/2016    Lab Results  Component Value Date   WBC 7.0 10/10/2016   HGB 13.0 10/10/2016   HCT 39.2 10/10/2016   PLT 323 10/10/2016   GLUCOSE 95 01/17/2017   CHOL 255 (H) 01/17/2017   TRIG 87.0 01/17/2017   HDL 109.70 01/17/2017   LDLDIRECT 112.0 10/13/2016   LDLCALC 128 (H) 01/17/2017   ALT 12 01/17/2017   AST 13 01/17/2017   NA 136 01/17/2017   K 4.5 01/17/2017   CL 99 01/17/2017   CREATININE 0.74 01/17/2017   BUN 14 01/17/2017   CO2 26 01/17/2017   TSH 1.27 01/17/2017   INR 1.2 (H) 10/20/2015   HGBA1C 6.0 01/17/2017    MICROALBUR <0.7 10/13/2016    Dg Chest 2 View  Result Date: 03/09/2016 CLINICAL DATA:  Cough, congestion and wheezing for 12 days EXAM: CHEST  2 VIEW COMPARISON:  02/27/2012 FINDINGS: The heart size and mediastinal contours are within normal limits. Both lungs are clear. The visualized skeletal structures are unremarkable. Chronic stable by shaped scoliosis of the dorsal spine with stable multilevel degenerative disc space narrowing and endplate spurring. Mid cervical spine facet hypertrophy and spurring is also noted. IMPRESSION: No active cardiopulmonary disease. Thoracolumbar spondylosis with S-shaped scoliosis unchanged in appearance. Electronically Signed   By: Tollie Eth M.D.   On: 03/09/2016 14:31    Assessment & Plan:   Problem List Items Addressed This Visit    B12 deficiency   Degenerative lumbar spinal stenosis    Attempts to control and relieve her pain have failed conservative therapy including ESI's and  spinal cord stimulator.  She continues to have daily disabling pain and prefers to  avoid narcotics , but has resorted to using tramadol at night only.  She  has resume cymbalta with mild improvement in symptoms.  Refills given . Continue tylenol and tramadol at night only. 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  Advised to continue low glycemic index diet .  She is unable to resume resume regular exercise due to back pain and DJD right knee.  No proteinuria  Lab Results  Component Value Date   HGBA1C 6.0 01/17/2017    Lab Results  Component Value Date   MICROALBUR <0.7 10/13/2016             Relevant Orders   Hemoglobin A1c (Completed)   Hyperlipidemia - Primary    Based on current lipid profile, the risk of clinically significant CAD is 25% over the next 10 years, using the Framingham risk calculator. Despite  a diagnostic cath in 2014 that was  negative for significant disease.  Will advise use of daily baby aspirin  And statin therapy  Lab  Results  Component Value Date   CHOL 255 (H) 01/17/2017   HDL 109.70 01/17/2017   LDLCALC 128 (H) 01/17/2017   LDLDIRECT 112.0 10/13/2016   TRIG 87.0 01/17/2017   CHOLHDL 2 01/17/2017             Relevant Orders   Lipid panel (Completed)   Hypertension    Elevated today.  Reviewed list of meds, patient is  taking OTC naproxen that could be causing,. It.  Have asked patient to recheck bp at home a minimum of 5 times over the next 4 weeks and call readings to office for adjustment of medications.    / Lab Results  Component Value Date   CREATININE 0.74 01/17/2017   Lab Results  Component Value Date   NA 136 01/17/2017   K 4.5 01/17/2017   CL 99 01/17/2017   CO2 26 01/17/2017         Relevant Orders   Comprehensive metabolic panel (Completed)    Other Visit Diagnoses    Vitamin D deficiency       Relevant Orders   VITAMIN D 25 Hydroxy (Vit-D Deficiency, Fractures) (Completed)   Cognitive complaints       Relevant Orders   TSH (Completed)   RPR (Completed)   Screening breast examination       Relevant Orders   MM DIAG BREAST TOMO BILATERAL   Primary osteoarthritis of right knee       Relevant Orders   Ambulatory referral to Orthopedic Surgery     A total of 25 minutes of face to face time was spent with patient more than half of which was spent in counselling and coordination of care   I have discontinued Ms. Lapoint's Ferrous Gluconate-C-Folic Acid (IRON-C PO) and baclofen. I am also having her maintain her naproxen, acetaminophen, glucose blood, freestyle, Syringe (Disposable), blood glucose meter kit and supplies, Vitamin D3, Biotin, vitamin E, losartan, traZODone, DULoxetine, metFORMIN, traZODone, ALPRAZolam, omeprazole, lamoTRIgine, cyanocobalamin, and traMADol.  No orders of the defined types were placed in this encounter.   Medications Discontinued  During This Encounter  Medication Reason  . baclofen (LIORESAL) 10 MG tablet Patient has not taken in last 30 days  . Ferrous Gluconate-C-Folic Acid (IRON-C PO) Error    Follow-up: No Follow-up on file.   Crecencio Mc, MD

## 2017-01-17 NOTE — Patient Instructions (Signed)
Referral to Dr Darryl Lent is in progress for your right knee

## 2017-01-18 LAB — RPR: RPR Ser Ql: NONREACTIVE

## 2017-01-20 NOTE — Assessment & Plan Note (Addendum)
She has stage II prolapse with detrusor overactivity and urinary incontinence by 2015 Urogyn evaluation .  Anticholinergics have been prescribed and were ineffective.

## 2017-01-20 NOTE — Assessment & Plan Note (Signed)
Elevated today.  Reviewed list of meds, patient is  taking OTC naproxen that could be causing,. It.  Have asked patient to recheck bp at home a minimum of 5 times over the next 4 weeks and call readings to office for adjustment of medications.    / Lab Results  Component Value Date   CREATININE 0.74 01/17/2017   Lab Results  Component Value Date   NA 136 01/17/2017   K 4.5 01/17/2017   CL 99 01/17/2017   CO2 26 01/17/2017

## 2017-01-20 NOTE — Assessment & Plan Note (Addendum)
Excellent control with metformin to 850 mg bid.  Advised to continue low glycemic index diet .  She is unable to resume resume regular exercise due to back pain and DJD right knee.  No proteinuria  Lab Results  Component Value Date   HGBA1C 6.0 01/17/2017    Lab Results  Component Value Date   MICROALBUR <0.7 10/13/2016

## 2017-01-20 NOTE — Assessment & Plan Note (Signed)
Attempts to control and relieve her pain have failed conservative therapy including ESI's and  spinal cord stimulator.  She continues to have daily disabling pain and prefers to  avoid narcotics , but has resorted to using tramadol at night only.  She  has resume cymbalta with mild improvement in symptoms.  Refills given . Continue tylenol and tramadol at night only. 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

## 2017-01-20 NOTE — Assessment & Plan Note (Signed)
Based on current lipid profile, the risk of clinically significant CAD is 25% over the next 10 years, using the Framingham risk calculator. Despite  a diagnostic cath in 2014 that was negative for significant disease.  Will advise use of daily baby aspirin  And statin therapy  Lab Results  Component Value Date   CHOL 255 (H) 01/17/2017   HDL 109.70 01/17/2017   LDLCALC 128 (H) 01/17/2017   LDLDIRECT 112.0 10/13/2016   TRIG 87.0 01/17/2017   CHOLHDL 2 01/17/2017

## 2017-01-21 ENCOUNTER — Telehealth: Payer: Self-pay | Admitting: Internal Medicine

## 2017-01-21 ENCOUNTER — Encounter: Payer: Self-pay | Admitting: Internal Medicine

## 2017-01-21 NOTE — Telephone Encounter (Signed)
[employer] disability form is complete.  Charge is $50  Red folder.

## 2017-01-22 ENCOUNTER — Ambulatory Visit
Admission: RE | Admit: 2017-01-22 | Discharge: 2017-01-22 | Disposition: A | Discharge status: Home / Self Care | Payer: Managed Care, Other (non HMO) | Source: Ambulatory Visit | Attending: Internal Medicine | Admitting: Internal Medicine

## 2017-01-22 DIAGNOSIS — Z1231 Encounter for screening mammogram for malignant neoplasm of breast: Secondary | ICD-10-CM

## 2017-01-22 NOTE — Telephone Encounter (Signed)
LMTCB

## 2017-01-22 NOTE — Telephone Encounter (Signed)
Pt has came by and picked up the medication.

## 2017-02-05 ENCOUNTER — Ambulatory Visit: Payer: Managed Care, Other (non HMO)

## 2017-02-07 ENCOUNTER — Other Ambulatory Visit: Payer: Self-pay | Admitting: Internal Medicine

## 2017-02-12 ENCOUNTER — Other Ambulatory Visit: Payer: Self-pay | Admitting: Internal Medicine

## 2017-02-12 DIAGNOSIS — Z760 Encounter for issue of repeat prescription: Secondary | ICD-10-CM

## 2017-03-06 ENCOUNTER — Other Ambulatory Visit: Payer: Self-pay | Admitting: Internal Medicine

## 2017-03-06 DIAGNOSIS — E119 Type 2 diabetes mellitus without complications: Secondary | ICD-10-CM | POA: Diagnosis not present

## 2017-03-13 ENCOUNTER — Other Ambulatory Visit: Payer: Self-pay | Admitting: Internal Medicine

## 2017-03-13 DIAGNOSIS — Z760 Encounter for issue of repeat prescription: Secondary | ICD-10-CM

## 2017-03-15 ENCOUNTER — Other Ambulatory Visit: Payer: Self-pay | Admitting: Internal Medicine

## 2017-03-15 DIAGNOSIS — Z760 Encounter for issue of repeat prescription: Secondary | ICD-10-CM

## 2017-03-16 NOTE — Telephone Encounter (Signed)
Copied from [place]. Topic: Quick Communication - See Telephone Encounter >> Mar 16, 2017 11:36 AM Adair Laundry, CMA wrote: CRM for notification. See Telephone encounter for:  03/16/17. >> Mar 16, 2017 11:44 AM Oneta Rack wrote:  Relation to CH:EKBT Call back number: [phone] (M)  Reason for call:  Patient returning call and would like  Alprazolam sent to  [place], [place] - [place] [phone] (Phone) [phone] (Fax)

## 2017-03-16 NOTE — Telephone Encounter (Signed)
Called the pt to ask where she would like to have her alprazolam sent?

## 2017-03-16 NOTE — Telephone Encounter (Signed)
I spoke with pt & informed her that this was faxed in November for #30 with 3 refills. She will contact pharmacy.

## 2017-04-12 ENCOUNTER — Telehealth: Payer: Self-pay | Admitting: Podiatry

## 2017-04-12 DIAGNOSIS — M1711 Unilateral primary osteoarthritis, right knee: Secondary | ICD-10-CM | POA: Diagnosis not present

## 2017-04-12 NOTE — Telephone Encounter (Signed)
I am needing to speak to the lady that does the laser treatment on toenails. I just had some questions. My number is [phone] and if she cannot get to me today maybe tomorrow.

## 2017-04-13 ENCOUNTER — Other Ambulatory Visit: Payer: Self-pay | Admitting: Internal Medicine

## 2017-04-13 ENCOUNTER — Telehealth: Payer: Self-pay | Admitting: Internal Medicine

## 2017-04-13 DIAGNOSIS — Z760 Encounter for issue of repeat prescription: Secondary | ICD-10-CM

## 2017-04-13 NOTE — Telephone Encounter (Signed)
LVM returning patients call.

## 2017-04-13 NOTE — Telephone Encounter (Signed)
Preoperative evaluation requested by [facility] for knee surgery in  March  .  Needs to be seen ,,  Last OV October.

## 2017-04-13 NOTE — Telephone Encounter (Signed)
Refilled: 02/13/2017 Last OV: 01/17/2017 Next OV: not scheduled

## 2017-04-16 ENCOUNTER — Telehealth: Payer: Self-pay

## 2017-04-16 NOTE — Telephone Encounter (Signed)
Lm on vm to come in on 05/28/2017 at 3:30. Sent a CRM to [facility]

## 2017-04-16 NOTE — Telephone Encounter (Signed)
See telephone note.

## 2017-04-16 NOTE — Telephone Encounter (Signed)
LMTCB. Need to schedule pt an appt for surgical clearance with Dr. Derrel Nip before her surgery 06/04/2017. PEC may speak with pt.

## 2017-04-19 NOTE — Telephone Encounter (Signed)
Spoke with pt and she has been scheduled for 05/28/2017 at 3:30pm.

## 2017-05-03 ENCOUNTER — Telehealth: Payer: Self-pay | Admitting: Internal Medicine

## 2017-05-03 NOTE — Telephone Encounter (Signed)
Yes  You can tell her yes .  Am here until 3:00 so if you want to set her up as an urgent visit with me, you can

## 2017-05-03 NOTE — Telephone Encounter (Signed)
Scheduled for 05/04/17

## 2017-05-03 NOTE — Telephone Encounter (Signed)
CRM for notification. See Telephone encounter for:   05/03/17.  Relation to pt: self Call back number: [phone]  Pharmacy: [id number] - [place], [place] [phone] (Phone) [phone] (Fax)    Reason for call:  Patient experiencing pain while urinating, urine cloudy, no odor, patient requesting to drop off urine, please advise if appointment is needed, office 100 % booked, please advise

## 2017-05-03 NOTE — Telephone Encounter (Signed)
Ok to have patient leave urine, Dysuria ,frequency?

## 2017-05-04 ENCOUNTER — Ambulatory Visit (INDEPENDENT_AMBULATORY_CARE_PROVIDER_SITE_OTHER): Payer: Medicare HMO | Admitting: Internal Medicine

## 2017-05-04 ENCOUNTER — Encounter: Payer: Self-pay | Admitting: Internal Medicine

## 2017-05-04 VITALS — BP 128/82 | HR 100 | Temp 98.1°F | Resp 16 | Ht 60.0 in | Wt 167.1 lb

## 2017-05-04 DIAGNOSIS — I10 Essential (primary) hypertension: Secondary | ICD-10-CM

## 2017-05-04 DIAGNOSIS — N3090 Cystitis, unspecified without hematuria: Secondary | ICD-10-CM

## 2017-05-04 DIAGNOSIS — M48061 Spinal stenosis, lumbar region without neurogenic claudication: Secondary | ICD-10-CM

## 2017-05-04 DIAGNOSIS — R300 Dysuria: Secondary | ICD-10-CM | POA: Diagnosis not present

## 2017-05-04 DIAGNOSIS — E119 Type 2 diabetes mellitus without complications: Secondary | ICD-10-CM

## 2017-05-04 LAB — CBC WITH DIFFERENTIAL/PLATELET
Basophils Absolute: 0 10*3/uL (ref 0.0–0.1)
Basophils Relative: 0.7 % (ref 0.0–3.0)
Eosinophils Absolute: 0.2 10*3/uL (ref 0.0–0.7)
Eosinophils Relative: 3.4 % (ref 0.0–5.0)
HCT: 38.6 % (ref 36.0–46.0)
Hemoglobin: 12.8 g/dL (ref 12.0–15.0)
Lymphocytes Relative: 31.8 % (ref 12.0–46.0)
Lymphs Abs: 2 10*3/uL (ref 0.7–4.0)
MCHC: 33.3 g/dL (ref 30.0–36.0)
MCV: 91.3 fl (ref 78.0–100.0)
Monocytes Absolute: 0.5 10*3/uL (ref 0.1–1.0)
Monocytes Relative: 8.4 % (ref 3.0–12.0)
Neutro Abs: 3.5 10*3/uL (ref 1.4–7.7)
Neutrophils Relative %: 55.7 % (ref 43.0–77.0)
Platelets: 408 10*3/uL — ABNORMAL HIGH (ref 150.0–400.0)
RBC: 4.23 Mil/uL (ref 3.87–5.11)
RDW: 13.6 % (ref 11.5–15.5)
WBC: 6.2 10*3/uL (ref 4.0–10.5)

## 2017-05-04 LAB — COMPREHENSIVE METABOLIC PANEL
ALT: 11 U/L (ref 0–35)
AST: 11 U/L (ref 0–37)
Albumin: 4.4 g/dL (ref 3.5–5.2)
Alkaline Phosphatase: 103 U/L (ref 39–117)
BUN: 11 mg/dL (ref 6–23)
CO2: 30 mEq/L (ref 19–32)
Calcium: 9.8 mg/dL (ref 8.4–10.5)
Chloride: 99 mEq/L (ref 96–112)
Creatinine, Ser: 0.77 mg/dL (ref 0.40–1.20)
GFR: 79.91 mL/min (ref >60.00)
Glucose, Bld: 107 mg/dL — ABNORMAL HIGH (ref 70–99)
Potassium: 4.8 mEq/L (ref 3.5–5.1)
Sodium: 137 mEq/L (ref 135–145)
Total Bilirubin: 0.4 mg/dL (ref 0.2–1.2)
Total Protein: 7.6 g/dL (ref 6.0–8.3)

## 2017-05-04 LAB — URINALYSIS, MICROSCOPIC ONLY

## 2017-05-04 LAB — POCT URINALYSIS DIPSTICK
Bilirubin, UA: NEGATIVE
Glucose, UA: NEGATIVE
Ketones, UA: NEGATIVE
Nitrite, UA: NEGATIVE
Protein, UA: NEGATIVE
Spec Grav, UA: 1.015 (ref 1.010–1.025)
Urobilinogen, UA: 0.2 E.U./dL
pH, UA: 7 (ref 5.0–8.0)

## 2017-05-04 LAB — HEMOGLOBIN A1C: Hgb A1c MFr Bld: 6.4 % (ref 4.6–6.5)

## 2017-05-04 MED ORDER — CIPROFLOXACIN HCL 250 MG PO TABS
250.0000 mg | ORAL_TABLET | Freq: Two times a day (BID) | ORAL | 0 refills | Status: DC
Start: 2017-05-04 — End: 2017-05-16

## 2017-05-04 NOTE — Progress Notes (Signed)
 Subjective:  Patient ID: Latorria P Sayed, female    DOB: 10/01/1950  Age: 67 y.o. MRN: [id number]  CC: The primary encounter diagnosis was Essential hypertension. Diagnoses of Dysuria, Degenerative lumbar spinal stenosis, Diabetes mellitus without complication (HCC), and Cystitis were also pertinent to this visit.  HPI Shacarra P Criss presents for  Follow up on type 2 Diabetes and new onset Cloudy urine,  Suprapubic pressure  For the past 2 weeks.    Risk factors:  History of incomplete bladder suspension surgery years ago . Still feels a bladder bulge.  Not sexually active.  No swimming pool use. Denies fevers,  Back pain,  But has had frequency.    Outpatient Medications Prior to Visit  Medication Sig Dispense Refill  . acetaminophen (TYLENOL) 325 MG tablet Take 650 mg by mouth every 6 (six) hours as needed.    . ALPRAZolam (XANAX) 0.5 MG tablet TAKE 1 TABLET BY MOUTH AT BEDITME AS NEEDED 30 tablet 2  . Biotin (BIOTIN 5000) 5 MG CAPS Take 1 capsule by mouth daily.    . blood glucose meter kit and supplies Dispense Contour next bayer meter. E11.9 To check blood glucose  Once a day. 1 each 0  . Cholecalciferol (VITAMIN D3) 1000 units CAPS Take 1 capsule by mouth daily.    . cyanocobalamin (,VITAMIN B-12,) 1000 MCG/ML injection INJECT 1ML INTO THE MUSCLE ONCE A WEEK FOR 3 WEEKS, THEN MONTHLY THEREAFTER 10 mL 3  . DULoxetine (CYMBALTA) 30 MG capsule TAKE 1 CAPSULE BY MOUTH EVERY DAY 90 capsule 1  . glucose blood (FREESTYLE LITE) test strip Use once daily  as instructed to test blood sugar E 11.9 100 each 3  . lamoTRIgine (LAMICTAL) 200 MG tablet TAKE ONE TABLET BY MOUTH TWICE DAILY 180 tablet 1  . Lancets (FREESTYLE) lancets Use once daily to check blood sugars  E11.9 100 each 3  . losartan (COZAAR) 50 MG tablet TAKE ONE TABLET BY MOUTH EVERY DAY 90 tablet 1  . metFORMIN (GLUCOPHAGE) 850 MG tablet TAKE 1 TABLET BY MOUTH TWICE DAILY WITH A MEAL 180 tablet 2  . naproxen (NAPROSYN) 250 MG  tablet Take 250 mg by mouth 2 (two) times daily with a meal.    . omeprazole (PRILOSEC) 40 MG capsule TAKE 1 CAPSULE BY MOUTH EVERY DAY 90 capsule 1  . Syringe, Disposable, 1 ML MISC For use with B12 injections 25 each 3  . traZODone (DESYREL) 50 MG tablet     . traZODone (DESYREL) 50 MG tablet TAKE 1/2 TO 1 TABLET AT BEDTIME AS NEEDED FOR SLEEP 30 tablet 2  . vitamin E 1000 UNIT capsule Take 1,000 Units by mouth daily.    . traMADol (ULTRAM) 50 MG tablet TAKE ONE TABLET BY MOUTH EVERY EIGHT HOURS AS NEEDED (Patient not taking: Reported on 05/04/2017) 30 tablet 1   No facility-administered medications prior to visit.     Review of Systems;  Patient denies headache, fevers, malaise, unintentional weight loss, skin rash, eye pain, sinus congestion and sinus pain, sore throat, dysphagia,  hemoptysis , cough, dyspnea, wheezing, chest pain, palpitations, orthopnea, edema, abdominal pain, nausea, melena, diarrhea, constipation, flank pain, dysuria, hematuria, urinary  Frequency, nocturia, numbness, tingling, seizures,  Focal weakness, Loss of consciousness,  Tremor, insomnia, depression, anxiety, and suicidal ideation.      Objective:  BP 128/82   Pulse 100   Temp 98.1 F (36.7 C) (Oral)   Resp 16   Ht 5' (1.524 m)   Wt 167   lb 1.9 oz (75.8 kg)   SpO2 98%   BMI 32.64 kg/m   BP Readings from Last 3 Encounters:  05/04/17 128/82  01/17/17 (!) 150/78  10/10/16 120/70    Wt Readings from Last 3 Encounters:  05/04/17 167 lb 1.9 oz (75.8 kg)  01/17/17 160 lb 12.8 oz (72.9 kg)  09/12/16 150 lb 9.6 oz (68.3 kg)    General appearance: alert, cooperative and appears stated age Ears: normal TM's and external ear canals both ears Throat: lips, mucosa, and tongue normal; teeth and gums normal Neck: no adenopathy, no carotid bruit, supple, symmetrical, trachea midline and thyroid not enlarged, symmetric, no tenderness/mass/nodules Back: symmetric, no curvature. ROM normal. No CVA  tenderness. Lungs: clear to auscultation bilaterally Heart: regular rate and rhythm, S1, S2 normal, no murmur, click, rub or gallop Abdomen: soft, non-tender; bowel sounds normal; no masses,  no organomegaly Pulses: 2+ and symmetric Skin: Skin color, texture, turgor normal. No rashes or lesions Lymph nodes: Cervical, supraclavicular, and axillary nodes normal.  Lab Results  Component Value Date   HGBA1C 6.4 05/04/2017   HGBA1C 6.0 01/17/2017   HGBA1C 5.9 10/13/2016    Lab Results  Component Value Date   CREATININE 0.77 05/04/2017   CREATININE 0.74 01/17/2017   CREATININE 0.84 10/13/2016    Lab Results  Component Value Date   WBC 6.2 05/04/2017   HGB 12.8 05/04/2017   HCT 38.6 05/04/2017   PLT 408.0 (H) 05/04/2017   GLUCOSE 107 (H) 05/04/2017   CHOL 255 (H) 01/17/2017   TRIG 87.0 01/17/2017   HDL 109.70 01/17/2017   LDLDIRECT 112.0 10/13/2016   LDLCALC 128 (H) 01/17/2017   ALT 11 05/04/2017   AST 11 05/04/2017   NA 137 05/04/2017   K 4.8 05/04/2017   CL 99 05/04/2017   CREATININE 0.77 05/04/2017   BUN 11 05/04/2017   CO2 30 05/04/2017   TSH 1.27 01/17/2017   INR 1.2 (H) 10/20/2015   HGBA1C 6.4 05/04/2017   MICROALBUR <0.7 10/13/2016    Mm Screening Breast Tomo Bilateral  Result Date: 01/22/2017 CLINICAL DATA:  Screening. EXAM: 2D DIGITAL SCREENING BILATERAL MAMMOGRAM WITH CAD AND ADJUNCT TOMO COMPARISON:  Previous exam(s). ACR Breast Density Category b: There are scattered areas of fibroglandular density. FINDINGS: There are no findings suspicious for malignancy. Images were processed with CAD. IMPRESSION: No mammographic evidence of malignancy. A result letter of this screening mammogram will be mailed directly to the patient. RECOMMENDATION: Screening mammogram in one year. (Code:SM-B-01Y) BI-RADS CATEGORY  1: Negative. Electronically Signed   By: Elizabeth  Brown M.D.   On: 01/22/2017 16:07    Assessment & Plan:   Problem List Items Addressed This Visit     Degenerative lumbar spinal stenosis   Hypertension - Primary   Cystitis    Patient was  given empiric antibiotics to start if symptoms worsen  Based UA suggestive of infection., and advised to take a probiotic if she started the medication . Her culture was negative for infection.  However she has pyuria without bacteruria.  If her symptoms persist will recommend urology follow up to rule out interstitial  cystitis       Diabetes mellitus without complication (HCC)    Excellent control with metformin at dose of  850 mg bid.  Advised to continue low glycemic index diet .  She is unable to resume resume regular exercise due to back pain and DJD right knee.  No proteinuria  Lab Results  Component Value Date     HGBA1C 6.4 05/04/2017    Lab Results  Component Value Date   MICROALBUR <0.7 10/13/2016             Relevant Orders   Hemoglobin A1c (Completed)   Comprehensive metabolic panel (Completed)    Other Visit Diagnoses    Dysuria       Relevant Orders   POCT urinalysis dipstick (Completed)   Urine Microscopic Only (Completed)   Urine Culture (Completed)   CBC with Differential/Platelet (Completed)    A total of 25 minutes of face to face time was spent with patient more than half of which was spent in counselling about the above mentioned conditions  and coordination of care   I am having Makynzie P. Walther start on ciprofloxacin. I am also having her maintain her naproxen, acetaminophen, glucose blood, freestyle, Syringe (Disposable), blood glucose meter kit and supplies, Vitamin D3, Biotin, vitamin E, traZODone, metFORMIN, omeprazole, lamoTRIgine, cyanocobalamin, traMADol, DULoxetine, traZODone, losartan, and ALPRAZolam.  Meds ordered this encounter  Medications  . ciprofloxacin (CIPRO) 250 MG tablet    Sig: Take 1 tablet (250 mg total) by mouth 2 (two) times daily.    Dispense:  6 tablet    Refill:  0    There are no discontinued medications.  Follow-up: No Follow-up  on file.   Crecencio Mc, MD

## 2017-05-05 LAB — URINE CULTURE
MICRO NUMBER:: 90139624
SPECIMEN QUALITY:: ADEQUATE

## 2017-05-06 DIAGNOSIS — N3090 Cystitis, unspecified without hematuria: Secondary | ICD-10-CM | POA: Insufficient documentation

## 2017-05-06 NOTE — Assessment & Plan Note (Signed)
Patient was  given empiric antibiotics to start if symptoms worsen  Based UA suggestive of infection., and advised to take a probiotic if she started the medication . Her culture was negative for infection.  However she has pyuria without bacteruria.  If her symptoms persist will recommend urology follow up to rule out interstitial  cystitis

## 2017-05-06 NOTE — Assessment & Plan Note (Signed)
Excellent control with metformin at dose of  850 mg bid.  Advised to continue low glycemic index diet .  She is unable to resume resume regular exercise due to back pain and DJD right knee.  No proteinuria  Lab Results  Component Value Date   HGBA1C 6.4 05/04/2017    Lab Results  Component Value Date   MICROALBUR <0.7 10/13/2016

## 2017-05-09 ENCOUNTER — Ambulatory Visit: Payer: Self-pay | Admitting: Orthopedic Surgery

## 2017-05-09 DIAGNOSIS — H2512 Age-related nuclear cataract, left eye: Secondary | ICD-10-CM | POA: Diagnosis not present

## 2017-05-09 LAB — HM DIABETES EYE EXAM

## 2017-05-10 ENCOUNTER — Telehealth: Payer: Self-pay

## 2017-05-10 DIAGNOSIS — R69 Illness, unspecified: Secondary | ICD-10-CM | POA: Diagnosis not present

## 2017-05-10 NOTE — Telephone Encounter (Signed)
Copied from [place]. Topic: Appointment Scheduling - Scheduling Inquiry for Clinic >> May 10, 2017  8:20 AM Burnis Medin, NT wrote: Reason for CRM: Patient called and wanted to see if she can be worked on the  schedule for surgical clearance. Patient has an appointment scheduled for 2/25 and wanted to come in earlier. Pls call patient back for scheduling.

## 2017-05-11 NOTE — Telephone Encounter (Signed)
Patient just had an appointment on 05/04/17 can surgical clearance be completed fro knee surgery?

## 2017-05-11 NOTE — Telephone Encounter (Signed)
It cannot.  You can use a 1:00 next Wednesday

## 2017-05-14 NOTE — Telephone Encounter (Signed)
Called patient left message to call back and confirm appointment for  05/16/17 at one , [facility] may take confirmation I will schedule.

## 2017-05-14 NOTE — Telephone Encounter (Signed)
Patient scheduled and aware

## 2017-05-15 DIAGNOSIS — Z01818 Encounter for other preprocedural examination: Secondary | ICD-10-CM | POA: Diagnosis not present

## 2017-05-15 DIAGNOSIS — M1711 Unilateral primary osteoarthritis, right knee: Secondary | ICD-10-CM | POA: Diagnosis not present

## 2017-05-16 ENCOUNTER — Ambulatory Visit (INDEPENDENT_AMBULATORY_CARE_PROVIDER_SITE_OTHER): Payer: Medicare HMO | Admitting: Internal Medicine

## 2017-05-16 ENCOUNTER — Encounter: Payer: Self-pay | Admitting: Internal Medicine

## 2017-05-16 VITALS — BP 130/86 | HR 103 | Temp 98.7°F | Resp 15 | Ht 61.25 in | Wt 163.8 lb

## 2017-05-16 DIAGNOSIS — Z01818 Encounter for other preprocedural examination: Secondary | ICD-10-CM

## 2017-05-16 NOTE — Progress Notes (Signed)
 Subjective:  Patient ID: Katherine Jimenez, female    DOB: 12/21/1950  Age: 66 y.o. MRN: [id number]  CC: The primary encounter diagnosis was Pre-operative examination. A diagnosis of Preoperative evaluation to rule out surgical contraindication was also pertinent to this visit.  HPI Katherine Jimenez presents for preoperative  medical clearance, requested by her orthopedist, for future right knee replacement She has a history of  Type 2 DM which is well controlled without use of  insulin  She denies any recent episodes of chest pain and has had no prior cardiac history or workup.    Recent labs were done on Feb 1 and there is no CKD,  anemia or low albumin  Previously reported urinary symptoms resolvoed with empiric cipro,  But urine culture was negative for infection      Outpatient Medications Prior to Visit  Medication Sig Dispense Refill  . acetaminophen (TYLENOL) 500 MG tablet Take 1,000 mg by mouth every 6 (six) hours as needed (FOR PAIN.).    . ALPRAZolam (XANAX) 0.5 MG tablet TAKE 1 TABLET BY MOUTH AT BEDITME AS NEEDED (Patient taking differently: TAKE 1 TABLET (0.5 MG) BY MOUTH AT BEDITME) 30 tablet 2  . Biotin (BIOTIN 5000) 5 MG CAPS Take 2 capsules by mouth 2 (two) times daily.     . blood glucose meter kit and supplies Dispense Contour next bayer meter. E11.9 To check blood glucose  Once a day. 1 each 0  . Cholecalciferol (VITAMIN D3) 1000 units CAPS Take 1,000 Units by mouth daily.     . cyanocobalamin (,VITAMIN B-12,) 1000 MCG/ML injection INJECT 1ML INTO THE MUSCLE ONCE A WEEK FOR 3 WEEKS, THEN MONTHLY THEREAFTER 10 mL 3  . DULoxetine (CYMBALTA) 30 MG capsule TAKE 1 CAPSULE BY MOUTH EVERY DAY 90 capsule 1  . glucose blood (FREESTYLE LITE) test strip Use once daily  as instructed to test blood sugar E 11.9 100 each 3  . lamoTRIgine (LAMICTAL) 200 MG tablet TAKE ONE TABLET BY MOUTH TWICE DAILY 180 tablet 1  . Lancets (FREESTYLE) lancets Use once daily to check blood sugars  E11.9  100 each 3  . losartan (COZAAR) 50 MG tablet TAKE ONE TABLET BY MOUTH EVERY DAY 90 tablet 1  . metFORMIN (GLUCOPHAGE) 850 MG tablet TAKE 1 TABLET BY MOUTH TWICE DAILY WITH A MEAL 180 tablet 2  . naproxen (NAPROSYN) 250 MG tablet Take 250 mg by mouth 2 (two) times daily as needed (for pain.).     . omeprazole (PRILOSEC) 40 MG capsule TAKE 1 CAPSULE BY MOUTH EVERY DAY 90 capsule 1  . Syringe, Disposable, 1 ML MISC For use with B12 injections 25 each 3  . traZODone (DESYREL) 50 MG tablet TAKE 1/2 TO 1 TABLET AT BEDTIME AS NEEDED FOR SLEEP 30 tablet 2  . vitamin E 1000 UNIT capsule Take 1,000 Units by mouth daily.    . traMADol (ULTRAM) 50 MG tablet TAKE ONE TABLET BY MOUTH EVERY EIGHT HOURS AS NEEDED (Patient not taking: Reported on 05/16/2017) 30 tablet 1  . ciprofloxacin (CIPRO) 250 MG tablet Take 1 tablet (250 mg total) by mouth 2 (two) times daily. (Patient not taking: Reported on 05/15/2017) 6 tablet 0   No facility-administered medications prior to visit.     Review of Systems;  Patient denies headache, fevers, malaise, unintentional weight loss, skin rash, eye pain, sinus congestion and sinus pain, sore throat, dysphagia,  hemoptysis , cough, dyspnea, wheezing, chest pain, palpitations, orthopnea, edema, abdominal pain, nausea,   melena, diarrhea, constipation, flank pain, dysuria, hematuria, urinary  Frequency, nocturia, numbness, tingling, seizures,  Focal weakness, Loss of consciousness,  Tremor, insomnia, depression, anxiety, and suicidal ideation.      Objective:  BP 130/86 (BP Location: Left Arm, Patient Position: Sitting, Cuff Size: Normal)   Pulse (!) 103   Temp 98.7 F (37.1 C) (Oral)   Resp 15   Ht 5' 1.25" (1.556 m)   Wt 163 lb 12.8 oz (74.3 kg)   SpO2 96%   BMI 30.70 kg/m   BP Readings from Last 3 Encounters:  05/16/17 130/86  05/04/17 128/82  01/17/17 (!) 150/78    Wt Readings from Last 3 Encounters:  05/16/17 163 lb 12.8 oz (74.3 kg)  05/04/17 167 lb 1.9 oz (75.8  kg)  01/17/17 160 lb 12.8 oz (72.9 kg)    General appearance: alert, cooperative and appears stated age Ears: normal TM's and external ear canals both ears Throat: lips, mucosa, and tongue normal; teeth and gums normal Neck: no adenopathy, no carotid bruit, supple, symmetrical, trachea midline and thyroid not enlarged, symmetric, no tenderness/mass/nodules Back: symmetric, no curvature. ROM normal. No CVA tenderness. Lungs: clear to auscultation bilaterally Heart: regular rate and rhythm, S1, S2 normal, no murmur, click, rub or gallop Abdomen: soft, non-tender; bowel sounds normal; no masses,  no organomegaly Pulses: 2+ and symmetric Skin: Skin color, texture, turgor normal. No rashes or lesions Lymph nodes: Cervical, supraclavicular, and axillary nodes normal.  Lab Results  Component Value Date   HGBA1C 6.4 05/04/2017   HGBA1C 6.0 01/17/2017   HGBA1C 5.9 10/13/2016    Lab Results  Component Value Date   CREATININE 0.77 05/04/2017   CREATININE 0.74 01/17/2017   CREATININE 0.84 10/13/2016    Lab Results  Component Value Date   WBC 6.2 05/04/2017   HGB 12.8 05/04/2017   HCT 38.6 05/04/2017   PLT 408.0 (H) 05/04/2017   GLUCOSE 107 (H) 05/04/2017   CHOL 255 (H) 01/17/2017   TRIG 87.0 01/17/2017   HDL 109.70 01/17/2017   LDLDIRECT 112.0 10/13/2016   LDLCALC 128 (H) 01/17/2017   ALT 11 05/04/2017   AST 11 05/04/2017   NA 137 05/04/2017   K 4.8 05/04/2017   CL 99 05/04/2017   CREATININE 0.77 05/04/2017   BUN 11 05/04/2017   CO2 30 05/04/2017   TSH 1.27 01/17/2017   INR 1.2 (H) 10/20/2015   HGBA1C 6.4 05/04/2017   MICROALBUR <0.7 10/13/2016    Mm Screening Breast Tomo Bilateral  Result Date: 01/22/2017 CLINICAL DATA:  Screening. EXAM: 2D DIGITAL SCREENING BILATERAL MAMMOGRAM WITH CAD AND ADJUNCT TOMO COMPARISON:  Previous exam(s). ACR Breast Density Category b: There are scattered areas of fibroglandular density. FINDINGS: There are no findings suspicious for  malignancy. Images were processed with CAD. IMPRESSION: No mammographic evidence of malignancy. A result letter of this screening mammogram will be mailed directly to the patient. RECOMMENDATION: Screening mammogram in one year. (Code:SM-B-01Y) BI-RADS CATEGORY  1: Negative. Electronically Signed   By: Nolon Nations M.D.   On: 01/22/2017 16:07    Assessment & Plan:   Problem List Items Addressed This Visit    Preoperative evaluation to rule out surgical contraindication    I have reviewed her EKG , done today and it has no evidence of arrhythmias or ischemia.  Patient  is considered to be at low risk  For perioperative complications  based on today's exam and history.         Other Visit Diagnoses  Pre-operative examination    -  Primary   Relevant Orders   EKG 12-Lead (Completed)      I have discontinued Tasharra P. Leikam's ciprofloxacin. I am also having her maintain her naproxen, glucose blood, freestyle, Syringe (Disposable), blood glucose meter kit and supplies, Vitamin D3, Biotin, vitamin E, metFORMIN, omeprazole, lamoTRIgine, cyanocobalamin, traMADol, DULoxetine, traZODone, losartan, ALPRAZolam, and acetaminophen.  No orders of the defined types were placed in this encounter.   Medications Discontinued During This Encounter  Medication Reason  . ciprofloxacin (CIPRO) 250 MG tablet Completed Course    Follow-up: No Follow-up on file.   Crecencio Mc, MD

## 2017-05-17 NOTE — Assessment & Plan Note (Signed)
I have reviewed her EKG , done today and it has no evidence of arrhythmias or ischemia.  Patient  is considered to be at low risk  For perioperative complications  based on today's exam and history.

## 2017-05-21 DIAGNOSIS — H2512 Age-related nuclear cataract, left eye: Secondary | ICD-10-CM | POA: Diagnosis not present

## 2017-05-22 ENCOUNTER — Encounter: Payer: Self-pay | Admitting: *Deleted

## 2017-05-22 ENCOUNTER — Other Ambulatory Visit: Payer: Self-pay

## 2017-05-23 ENCOUNTER — Encounter
Admission: RE | Admit: 2017-05-23 | Discharge: 2017-05-23 | Disposition: A | Discharge status: Home / Self Care | Payer: Medicare HMO | Source: Ambulatory Visit | Attending: Orthopedic Surgery | Admitting: Orthopedic Surgery

## 2017-05-23 ENCOUNTER — Other Ambulatory Visit: Payer: Self-pay

## 2017-05-23 DIAGNOSIS — E119 Type 2 diabetes mellitus without complications: Secondary | ICD-10-CM | POA: Diagnosis not present

## 2017-05-23 DIAGNOSIS — M1990 Unspecified osteoarthritis, unspecified site: Secondary | ICD-10-CM | POA: Diagnosis not present

## 2017-05-23 DIAGNOSIS — I10 Essential (primary) hypertension: Secondary | ICD-10-CM | POA: Diagnosis not present

## 2017-05-23 DIAGNOSIS — D649 Anemia, unspecified: Secondary | ICD-10-CM | POA: Insufficient documentation

## 2017-05-23 DIAGNOSIS — Z9889 Other specified postprocedural states: Secondary | ICD-10-CM | POA: Diagnosis not present

## 2017-05-23 DIAGNOSIS — F329 Major depressive disorder, single episode, unspecified: Secondary | ICD-10-CM | POA: Diagnosis not present

## 2017-05-23 DIAGNOSIS — Z8589 Personal history of malignant neoplasm of other organs and systems: Secondary | ICD-10-CM | POA: Diagnosis not present

## 2017-05-23 DIAGNOSIS — M25569 Pain in unspecified knee: Secondary | ICD-10-CM | POA: Insufficient documentation

## 2017-05-23 DIAGNOSIS — Z01812 Encounter for preprocedural laboratory examination: Secondary | ICD-10-CM | POA: Diagnosis present

## 2017-05-23 DIAGNOSIS — K219 Gastro-esophageal reflux disease without esophagitis: Secondary | ICD-10-CM | POA: Insufficient documentation

## 2017-05-23 DIAGNOSIS — M549 Dorsalgia, unspecified: Secondary | ICD-10-CM | POA: Insufficient documentation

## 2017-05-23 DIAGNOSIS — R69 Illness, unspecified: Secondary | ICD-10-CM | POA: Diagnosis not present

## 2017-05-23 DIAGNOSIS — F419 Anxiety disorder, unspecified: Secondary | ICD-10-CM | POA: Insufficient documentation

## 2017-05-23 DIAGNOSIS — Z888 Allergy status to other drugs, medicaments and biological substances status: Secondary | ICD-10-CM | POA: Insufficient documentation

## 2017-05-23 LAB — BASIC METABOLIC PANEL
Anion gap: 12 (ref 5–15)
BUN: 14 mg/dL (ref 6–20)
CO2: 25 mmol/L (ref 22–32)
Calcium: 10.1 mg/dL (ref 8.9–10.3)
Chloride: 99 mmol/L — ABNORMAL LOW (ref 101–111)
Creatinine, Ser: 0.7 mg/dL (ref 0.44–1.00)
GFR calc Af Amer: >60 mL/min (ref >60)
GFR calc non Af Amer: >60 mL/min (ref >60)
Glucose, Bld: 124 mg/dL — ABNORMAL HIGH (ref 65–99)
Potassium: 3.7 mmol/L (ref 3.5–5.1)
Sodium: 136 mmol/L (ref 135–145)

## 2017-05-23 LAB — CBC
HCT: 41.8 % (ref 35.0–47.0)
Hemoglobin: 13.6 g/dL (ref 12.0–16.0)
MCH: 29.7 pg (ref 26.0–34.0)
MCHC: 32.5 g/dL (ref 32.0–36.0)
MCV: 91.4 fL (ref 80.0–100.0)
Platelets: 345 10*3/uL (ref 150–440)
RBC: 4.57 MIL/uL (ref 3.80–5.20)
RDW: 14 % (ref 11.5–14.5)
WBC: 6.6 10*3/uL (ref 3.6–11.0)

## 2017-05-23 LAB — URINALYSIS, ROUTINE W REFLEX MICROSCOPIC
Bilirubin Urine: NEGATIVE
Glucose, UA: NEGATIVE mg/dL
Hgb urine dipstick: NEGATIVE
Ketones, ur: 5 mg/dL — AB
Nitrite: NEGATIVE
Protein, ur: NEGATIVE mg/dL
Specific Gravity, Urine: 1.018 (ref 1.005–1.030)
pH: 6 (ref 5.0–8.0)

## 2017-05-23 LAB — PROTIME-INR
INR: 1.05
Prothrombin Time: 13.6 seconds (ref 11.4–15.2)

## 2017-05-23 LAB — SURGICAL PCR SCREEN
MRSA, PCR: NEGATIVE
Staphylococcus aureus: POSITIVE — AB

## 2017-05-23 NOTE — Patient Instructions (Signed)
Your procedure is scheduled on: 06/04/17 Mon Report to Same Day Surgery 2nd floor medical mall [facility] Entrance-take elevator on left to 2nd floor.  Check in with surgery information desk.) To find out your arrival time please call [phone] between 1PM - 3PM on 06/01/17 Fri  Remember: Instructions that are not followed completely may result in serious medical risk, up to and including death, or upon the discretion of your surgeon and anesthesiologist your surgery may need to be rescheduled.    _x___ 1. Do not eat food after midnight the night before your procedure. You may drink clear liquids up to 2 hours before you are scheduled to arrive at the hospital for your procedure.  Do not drink clear liquids within 2 hours of your scheduled arrival to the hospital.  Clear liquids include  --Water or Apple juice without pulp  --Clear carbohydrate beverage such as ClearFast or Gatorade  --Black Coffee or Clear Tea (No milk, no creamers, do not add anything to                  the coffee or Tea Type 1 and type 2 diabetics should only drink water.  No gum chewing or hard candies.     __x__ 2. No Alcohol for 24 hours before or after surgery.   __x__3. No Smoking or e-cigarettes for 24 prior to surgery.  Do not use any chewable tobacco products for at least 6 hour prior to surgery   ____  4. Bring all medications with you on the day of surgery if instructed.    __x__ 5. Notify your doctor if there is any change in your medical condition     (cold, fever, infections).    x___6. On the morning of surgery brush your teeth with toothpaste and water.  You may rinse your mouth with mouth wash if you wish.  Do not swallow any toothpaste or mouthwash.   Do not wear jewelry, make-up, hairpins, clips or nail polish.  Do not wear lotions, powders, or perfumes. You may wear deodorant.  Do not shave 48 hours prior to surgery. Men may shave face and neck.  Do not bring valuables to the hospital.     [facility] is not responsible for any belongings or valuables.               Contacts, dentures or bridgework may not be worn into surgery.  Leave your suitcase in the car. After surgery it may be brought to your room.  For patients admitted to the hospital, discharge time is determined by your                       treatment team.  _  Patients discharged the day of surgery will not be allowed to drive home.  You will need someone to drive you home and stay with you the night of your procedure.    Please read over the following fact sheets that you were given:   [facility] Preparing for Surgery and or MRSA Information   _x___ Take anti-hypertensive listed below, cardiac, seizure, asthma,     anti-reflux and psychiatric medicines. These include:  1. DULoxetine (CYMBALTA) 30 MG capsule  2.lamoTRIgine (LAMICTAL) 200 MG tablet  3.omeprazole (PRILOSEC) 40 MG capsule  4.  5.  6.  ____Fleets enema or Magnesium Citrate as directed.   _x___ Use CHG Soap or sage wipes as directed on instruction sheet   ____ Use inhalers on  the day of surgery and bring to hospital day of surgery  _x___ Stop Metformin and Janumet 2 days prior to surgery.    ____ Take 1/2 of usual insulin dose the night before surgery and none on the morning     surgery.   _x___ Follow recommendations from Cardiologist, Pulmonologist or PCP regarding          stopping Aspirin, Coumadin, Plavix ,Eliquis, Effient, or Pradaxa, and Pletal.  X____Stop Anti-inflammatories such as Advil, Aleve, Ibuprofen, Motrin, Naproxen, Naprosyn, Goodies powders or aspirin products. OK to take Tylenol and                          Celebrex.   _x___ Stop supplements until after surgery.  But may continue Vitamin D, Vitamin B,       and multivitamin. Stop Vitamin e 1 week before surgery.   ____ Bring C-Pap to the hospital.

## 2017-05-23 NOTE — Pre-Procedure Instructions (Signed)
Instructed regarding incentive spirometry. 

## 2017-05-24 NOTE — Pre-Procedure Instructions (Signed)
Labs faxed to dr Harlow Mares.LEFT MESSAGE FOR Katherine Jimenez

## 2017-05-25 ENCOUNTER — Telehealth: Payer: Self-pay | Admitting: Internal Medicine

## 2017-05-25 DIAGNOSIS — M25561 Pain in right knee: Secondary | ICD-10-CM | POA: Diagnosis not present

## 2017-05-25 DIAGNOSIS — M25661 Stiffness of right knee, not elsewhere classified: Secondary | ICD-10-CM | POA: Diagnosis not present

## 2017-05-25 MED ORDER — MUPIROCIN 2 % EX OINT: 1.0000 "application " | TOPICAL_OINTMENT | Freq: Two times a day (BID) | CUTANEOUS | 0 refills | Status: DC

## 2017-05-25 MED ORDER — SULFAMETHOXAZOLE-TRIMETHOPRIM 800-160 MG PO TABS: 1.0000 | ORAL_TABLET | Freq: Two times a day (BID) | ORAL | 0 refills | Status: DC

## 2017-05-25 NOTE — Telephone Encounter (Signed)
Septra Ds and mupirocin sent to [facility]

## 2017-05-25 NOTE — Telephone Encounter (Signed)
Copied from [place] [id number]. Topic: Quick Communication - See Telephone Encounter >> May 25, 2017 11:59 AM Boyd Kerbs wrote: CRM for notification. See Telephone encounter for:   Pt. Saw PT and Dr. Rise Mu- having surgery 3/4 and she was diagnosed with Bladder Infection - and needs antibiotic.  Also staph infection in nose and needs a cream for this.   Ortho doctor is asking for Dr. Derrel Nip to call medications in for her, to be rid of before surgery.  [id number]: [phone]: [phone]   05/25/17.

## 2017-05-25 NOTE — Discharge Instructions (Signed)

## 2017-05-28 ENCOUNTER — Ambulatory Visit: Payer: Self-pay | Admitting: Internal Medicine

## 2017-05-28 ENCOUNTER — Ambulatory Visit: Payer: Medicare HMO | Admitting: Anesthesiology

## 2017-05-28 ENCOUNTER — Encounter
Admission: RE | Disposition: A | Discharge status: Home / Self Care | Payer: Self-pay | Source: Ambulatory Visit | Attending: Ophthalmology

## 2017-05-28 ENCOUNTER — Ambulatory Visit
Admission: RE | Admit: 2017-05-28 | Discharge: 2017-05-28 | Disposition: A | Discharge status: Home / Self Care | Payer: Medicare HMO | Source: Ambulatory Visit | Attending: Ophthalmology | Admitting: Ophthalmology

## 2017-05-28 DIAGNOSIS — Z7984 Long term (current) use of oral hypoglycemic drugs: Secondary | ICD-10-CM | POA: Diagnosis not present

## 2017-05-28 DIAGNOSIS — K219 Gastro-esophageal reflux disease without esophagitis: Secondary | ICD-10-CM | POA: Insufficient documentation

## 2017-05-28 DIAGNOSIS — Z79899 Other long term (current) drug therapy: Secondary | ICD-10-CM | POA: Diagnosis not present

## 2017-05-28 DIAGNOSIS — E1136 Type 2 diabetes mellitus with diabetic cataract: Secondary | ICD-10-CM | POA: Diagnosis present

## 2017-05-28 DIAGNOSIS — F329 Major depressive disorder, single episode, unspecified: Secondary | ICD-10-CM | POA: Insufficient documentation

## 2017-05-28 DIAGNOSIS — M1990 Unspecified osteoarthritis, unspecified site: Secondary | ICD-10-CM | POA: Insufficient documentation

## 2017-05-28 DIAGNOSIS — H2512 Age-related nuclear cataract, left eye: Secondary | ICD-10-CM | POA: Diagnosis not present

## 2017-05-28 DIAGNOSIS — I10 Essential (primary) hypertension: Secondary | ICD-10-CM | POA: Diagnosis not present

## 2017-05-28 DIAGNOSIS — R69 Illness, unspecified: Secondary | ICD-10-CM | POA: Diagnosis not present

## 2017-05-28 HISTORY — PX: CATARACT EXTRACTION W/PHACO: SHX586

## 2017-05-28 LAB — GLUCOSE, CAPILLARY
Glucose-Capillary: 105 mg/dL — ABNORMAL HIGH (ref 65–99)
Glucose-Capillary: 121 mg/dL — ABNORMAL HIGH (ref 65–99)

## 2017-05-28 SURGERY — PHACOEMULSIFICATION, CATARACT, WITH IOL INSERTION
Anesthesia: Monitor Anesthesia Care | Site: Eye | Laterality: Left | Wound class: Clean

## 2017-05-28 MED ORDER — BSS IO SOLN
INTRAOCULAR | Status: DC | PRN
  Administered 2017-05-28: 70 mL via OPHTHALMIC

## 2017-05-28 MED ORDER — CEFUROXIME OPHTHALMIC INJECTION 1 MG/0.1 ML
INJECTION | OPHTHALMIC | Status: DC | PRN
  Administered 2017-05-28: 0.1 mL via INTRACAMERAL

## 2017-05-28 MED ORDER — BALANCED SALT IO SOLN
INTRAOCULAR | Status: DC | PRN
  Administered 2017-05-28: 1 mL via INTRAMUSCULAR

## 2017-05-28 MED ORDER — MOXIFLOXACIN HCL 0.5 % OP SOLN
1.0000 [drp] | OPHTHALMIC | Status: DC | PRN
  Administered 2017-05-28 (×3): 1 [drp] via OPHTHALMIC

## 2017-05-28 MED ORDER — FENTANYL CITRATE (PF) 100 MCG/2ML IJ SOLN
INTRAMUSCULAR | Status: DC | PRN
  Administered 2017-05-28 (×2): 50 ug via INTRAVENOUS

## 2017-05-28 MED ORDER — MIDAZOLAM HCL 2 MG/2ML IJ SOLN
INTRAMUSCULAR | Status: DC | PRN
  Administered 2017-05-28 (×2): 1 mg via INTRAVENOUS

## 2017-05-28 MED ORDER — NA HYALUR & NA CHOND-NA HYALUR 0.4-0.35 ML IO KIT
PACK | INTRAOCULAR | Status: DC | PRN
  Administered 2017-05-28: 1 mL via INTRAOCULAR

## 2017-05-28 MED ORDER — BRIMONIDINE TARTRATE-TIMOLOL 0.2-0.5 % OP SOLN
OPHTHALMIC | Status: DC | PRN
  Administered 2017-05-28: 1 [drp] via OPHTHALMIC

## 2017-05-28 MED ORDER — ARMC OPHTHALMIC DILATING DROPS
1.0000 "application " | OPHTHALMIC | Status: DC | PRN
  Administered 2017-05-28 (×3): 1 via OPHTHALMIC

## 2017-05-28 SURGICAL SUPPLY — 19 items
CANNULA ANT/CHMB 27GA (MISCELLANEOUS) ×2 IMPLANT
GLOVE SURG LX 7.5 STRW (GLOVE) ×1
GLOVE SURG LX STRL 7.5 STRW (GLOVE) ×1 IMPLANT
GLOVE SURG TRIUMPH 8.0 PF LTX (GLOVE) ×2 IMPLANT
GOWN STRL REUS W/ TWL LRG LVL3 (GOWN DISPOSABLE) ×2 IMPLANT
GOWN STRL REUS W/TWL LRG LVL3 (GOWN DISPOSABLE) ×4
LENS IOL ACRSF IQ ULTRA 23.0 (Intraocular Lens) ×1 IMPLANT
LENS IOL ACRYSOF IQ 23.0 (Intraocular Lens) ×2 IMPLANT
MARKER SKIN DUAL TIP RULER LAB (MISCELLANEOUS) ×2 IMPLANT
NEEDLE FILTER BLUNT 18X 1/2SAF (NEEDLE) ×1
NEEDLE FILTER BLUNT 18X1 1/2 (NEEDLE) ×1 IMPLANT
PACK CATARACT BRASINGTON (MISCELLANEOUS) ×2 IMPLANT
PACK EYE AFTER SURG (MISCELLANEOUS) ×2 IMPLANT
PACK OPTHALMIC (MISCELLANEOUS) ×2 IMPLANT
SYR 3ML LL SCALE MARK (SYRINGE) ×2 IMPLANT
SYR 5ML LL (SYRINGE) ×2 IMPLANT
SYR TB 1ML LUER SLIP (SYRINGE) ×2 IMPLANT
WATER STERILE IRR 500ML POUR (IV SOLUTION) ×2 IMPLANT
WIPE NON LINTING 3.25X3.25 (MISCELLANEOUS) ×2 IMPLANT

## 2017-05-28 NOTE — Anesthesia Postprocedure Evaluation (Signed)
Anesthesia Post Note  Patient: Katherine Jimenez  Procedure(s) Performed: CATARACT EXTRACTION PHACO AND INTRAOCULAR LENS PLACEMENT (IOC) LEFT DIABETIC (Left Eye)  Patient location during evaluation: PACU Anesthesia Type: MAC Level of consciousness: awake and alert Pain management: pain level controlled Vital Signs Assessment: post-procedure vital signs reviewed and stable Respiratory status: spontaneous breathing, nonlabored ventilation, respiratory function stable and patient connected to nasal cannula oxygen Cardiovascular status: stable and blood pressure returned to baseline Postop Assessment: no apparent nausea or vomiting Anesthetic complications: no    Alisa Graff

## 2017-05-28 NOTE — Telephone Encounter (Signed)
Patient notified and voiced understanding.

## 2017-05-28 NOTE — Transfer of Care (Signed)
Immediate Anesthesia Transfer of Care Note  Patient: Katherine Jimenez  Procedure(s) Performed: CATARACT EXTRACTION PHACO AND INTRAOCULAR LENS PLACEMENT (IOC) LEFT DIABETIC (Left Eye)  Patient Location: PACU  Anesthesia Type: MAC  Level of Consciousness: awake, alert  and patient cooperative  Airway and Oxygen Therapy: Patient Spontanous Breathing and Patient connected to supplemental oxygen  Post-op Assessment: Post-op Vital signs reviewed, Patient's Cardiovascular Status Stable, Respiratory Function Stable, Patent Airway and No signs of Nausea or vomiting  Post-op Vital Signs: Reviewed and stable  Complications: No apparent anesthesia complications

## 2017-05-28 NOTE — Anesthesia Preprocedure Evaluation (Signed)
Anesthesia Evaluation  Patient identified by MRN, date of birth, ID band Patient awake    Reviewed: Allergy & Precautions, H&P , NPO status , Patient's Chart, lab work & pertinent test results, reviewed documented beta blocker date and time   Airway Mallampati: II  TM Distance: >3 FB Neck ROM: full    Dental no notable dental hx.    Pulmonary neg pulmonary ROS,    Pulmonary exam normal breath sounds clear to auscultation       Cardiovascular Exercise Tolerance: Good hypertension,  Rhythm:regular Rate:Normal     Neuro/Psych Depression Bipolar Disorder negative neurological ROS     GI/Hepatic negative GI ROS, Neg liver ROS,   Endo/Other  negative endocrine ROSdiabetes  Renal/GU negative Renal ROS  negative genitourinary   Musculoskeletal   Abdominal   Peds  Hematology negative hematology ROS (+)   Anesthesia Other Findings   Reproductive/Obstetrics negative OB ROS                             Anesthesia Physical Anesthesia Plan  ASA: II  Anesthesia Plan: MAC   Post-op Pain Management:    Induction:   PONV Risk Score and Plan:   Airway Management Planned:   Additional Equipment:   Intra-op Plan:   Post-operative Plan:   Informed Consent: I have reviewed the patients History and Physical, chart, labs and discussed the procedure including the risks, benefits and alternatives for the proposed anesthesia with the patient or authorized representative who has indicated his/her understanding and acceptance.   Dental Advisory Given  Plan Discussed with: CRNA  Anesthesia Plan Comments:         Anesthesia Quick Evaluation

## 2017-05-28 NOTE — H&P (Signed)
The History and Physical notes are on paper, have been signed, and are to be scanned. The patient remains stable and unchanged from the H&P.   Previous H&P reviewed, patient examined, and there are no changes.  Katherine Jimenez 05/28/2017 8:14 AM   

## 2017-05-28 NOTE — Op Note (Signed)
OPERATIVE NOTE  Katherine Jimenez WN:[id number] 05/28/2017   PREOPERATIVE DIAGNOSIS:  Nuclear sclerotic cataract left eye. H25.12   POSTOPERATIVE DIAGNOSIS:    Nuclear sclerotic cataract left eye.     PROCEDURE:  Phacoemusification with posterior chamber intraocular lens placement of the left eye   LENS:   Implant Name Type Inv. Item Serial No. Manufacturer Lot No. LRB No. Used  LENS IOL ACRYSOF IQ 23.0 - [id number] Intraocular Lens LENS IOL ACRYSOF IQ 23.0 [id number] ALCON  Left 1        ULTRASOUND TIME: 18  % of 1 minutes 10 seconds, CDE 12.7  SURGEON:  Wyonia Hough, MD   ANESTHESIA:  Topical with tetracaine drops and 2% Xylocaine jelly, augmented with 1% preservative-free intracameral lidocaine.    COMPLICATIONS:  None.   DESCRIPTION OF PROCEDURE:  The patient was identified in the holding room and transported to the operating room and placed in the supine position under the operating microscope.  The left eye was identified as the operative eye and it was prepped and draped in the usual sterile ophthalmic fashion.   A 1 millimeter clear-corneal paracentesis was made at the 1:30 position.  0.5 ml of preservative-free 1% lidocaine was injected into the anterior chamber.  The anterior chamber was filled with Viscoat viscoelastic.  A 2.4 millimeter keratome was used to make a near-clear corneal incision at the 10:30 position.  .  A curvilinear capsulorrhexis was made with a cystotome and capsulorrhexis forceps.  Balanced salt solution was used to hydrodissect and hydrodelineate the nucleus.   Phacoemulsification was then used in stop and chop fashion to remove the lens nucleus and epinucleus.  The remaining cortex was then removed using the irrigation and aspiration handpiece. Provisc was then placed into the capsular bag to distend it for lens placement.  A lens was then injected into the capsular bag.  The remaining viscoelastic was aspirated.   Wounds were hydrated with  balanced salt solution.  The anterior chamber was inflated to a physiologic pressure with balanced salt solution.  No wound leaks were noted. Cefuroxime 0.1 ml of a '10mg'$ /ml solution was injected into the anterior chamber for a dose of 1 mg of intracameral antibiotic at the completion of the case.   Timolol and Brimonidine drops were applied to the eye.  The patient was taken to the recovery room in stable condition without complications of anesthesia or surgery.  Katherine Jimenez 05/28/2017, 9:06 AM

## 2017-06-03 MED ORDER — CEFAZOLIN SODIUM-DEXTROSE 2-4 GM/100ML-% IV SOLN
2.0000 g | INTRAVENOUS | Status: AC
  Administered 2017-06-04: 2 g via INTRAVENOUS

## 2017-06-03 MED ORDER — TRANEXAMIC ACID 1000 MG/10ML IV SOLN
1000.0000 mg | INTRAVENOUS | Status: AC
  Administered 2017-06-04: 1000 mg via INTRAVENOUS
  Filled 2017-06-03: qty 10

## 2017-06-04 ENCOUNTER — Inpatient Hospital Stay
Admission: RE | Admit: 2017-06-04 | Discharge: 2017-06-06 | DRG: 470 | Disposition: A | Discharge status: Home / Self Care | Payer: Medicare HMO | Source: Ambulatory Visit | Attending: Orthopedic Surgery | Admitting: Orthopedic Surgery

## 2017-06-04 ENCOUNTER — Other Ambulatory Visit: Payer: Self-pay

## 2017-06-04 ENCOUNTER — Inpatient Hospital Stay: Payer: Medicare HMO

## 2017-06-04 ENCOUNTER — Inpatient Hospital Stay: Payer: Medicare HMO | Admitting: Anesthesiology

## 2017-06-04 ENCOUNTER — Encounter
Admission: RE | Disposition: A | Discharge status: Home / Self Care | Payer: Self-pay | Source: Ambulatory Visit | Attending: Orthopedic Surgery

## 2017-06-04 DIAGNOSIS — Z8261 Family history of arthritis: Secondary | ICD-10-CM

## 2017-06-04 DIAGNOSIS — Z85828 Personal history of other malignant neoplasm of skin: Secondary | ICD-10-CM

## 2017-06-04 DIAGNOSIS — M25561 Pain in right knee: Secondary | ICD-10-CM | POA: Diagnosis not present

## 2017-06-04 DIAGNOSIS — Z9841 Cataract extraction status, right eye: Secondary | ICD-10-CM | POA: Diagnosis not present

## 2017-06-04 DIAGNOSIS — I10 Essential (primary) hypertension: Secondary | ICD-10-CM | POA: Diagnosis not present

## 2017-06-04 DIAGNOSIS — Z8249 Family history of ischemic heart disease and other diseases of the circulatory system: Secondary | ICD-10-CM

## 2017-06-04 DIAGNOSIS — D649 Anemia, unspecified: Secondary | ICD-10-CM | POA: Diagnosis not present

## 2017-06-04 DIAGNOSIS — Z96643 Presence of artificial hip joint, bilateral: Secondary | ICD-10-CM | POA: Diagnosis not present

## 2017-06-04 DIAGNOSIS — Z7984 Long term (current) use of oral hypoglycemic drugs: Secondary | ICD-10-CM

## 2017-06-04 DIAGNOSIS — F319 Bipolar disorder, unspecified: Secondary | ICD-10-CM | POA: Diagnosis present

## 2017-06-04 DIAGNOSIS — Z9842 Cataract extraction status, left eye: Secondary | ICD-10-CM | POA: Diagnosis not present

## 2017-06-04 DIAGNOSIS — Z471 Aftercare following joint replacement surgery: Secondary | ICD-10-CM | POA: Diagnosis not present

## 2017-06-04 DIAGNOSIS — M1711 Unilateral primary osteoarthritis, right knee: Secondary | ICD-10-CM | POA: Diagnosis present

## 2017-06-04 DIAGNOSIS — Z833 Family history of diabetes mellitus: Secondary | ICD-10-CM | POA: Diagnosis not present

## 2017-06-04 DIAGNOSIS — G8918 Other acute postprocedural pain: Secondary | ICD-10-CM | POA: Diagnosis not present

## 2017-06-04 DIAGNOSIS — K219 Gastro-esophageal reflux disease without esophagitis: Secondary | ICD-10-CM | POA: Diagnosis present

## 2017-06-04 DIAGNOSIS — E119 Type 2 diabetes mellitus without complications: Secondary | ICD-10-CM | POA: Diagnosis present

## 2017-06-04 DIAGNOSIS — Z09 Encounter for follow-up examination after completed treatment for conditions other than malignant neoplasm: Secondary | ICD-10-CM

## 2017-06-04 DIAGNOSIS — R69 Illness, unspecified: Secondary | ICD-10-CM | POA: Diagnosis not present

## 2017-06-04 DIAGNOSIS — Z961 Presence of intraocular lens: Secondary | ICD-10-CM | POA: Diagnosis present

## 2017-06-04 DIAGNOSIS — E785 Hyperlipidemia, unspecified: Secondary | ICD-10-CM | POA: Diagnosis not present

## 2017-06-04 DIAGNOSIS — Z96651 Presence of right artificial knee joint: Secondary | ICD-10-CM | POA: Diagnosis not present

## 2017-06-04 HISTORY — PX: TOTAL KNEE ARTHROPLASTY: SHX125

## 2017-06-04 LAB — GLUCOSE, CAPILLARY
Glucose-Capillary: 148 mg/dL — ABNORMAL HIGH (ref 65–99)
Glucose-Capillary: 123 mg/dL — ABNORMAL HIGH (ref 65–99)
Glucose-Capillary: 166 mg/dL — ABNORMAL HIGH (ref 65–99)
Glucose-Capillary: 172 mg/dL — ABNORMAL HIGH (ref 65–99)

## 2017-06-04 LAB — ABO/RH: ABO/RH(D): A NEG

## 2017-06-04 SURGERY — ARTHROPLASTY, KNEE, TOTAL
Anesthesia: Spinal | Site: Knee | Laterality: Right | Wound class: Clean

## 2017-06-04 MED ORDER — PHENYLEPHRINE HCL 10 MG/ML IJ SOLN
INTRAMUSCULAR | Status: AC
  Filled 2017-06-04: qty 1

## 2017-06-04 MED ORDER — PROPOFOL 500 MG/50ML IV EMUL
INTRAVENOUS | Status: DC | PRN
  Administered 2017-06-04: 50 ug/kg/min via INTRAVENOUS

## 2017-06-04 MED ORDER — LAMOTRIGINE 100 MG PO TABS
200.0000 mg | ORAL_TABLET | Freq: Two times a day (BID) | ORAL | Status: DC
  Administered 2017-06-04 – 2017-06-06 (×2): 200 mg via ORAL
  Filled 2017-06-04 (×2): qty 2
  Filled 2017-06-04: qty 8
  Filled 2017-06-04: qty 2
  Filled 2017-06-04: qty 8

## 2017-06-04 MED ORDER — BUPIVACAINE HCL (PF) 0.5 % IJ SOLN
INTRAMUSCULAR | Status: AC
  Filled 2017-06-04: qty 10

## 2017-06-04 MED ORDER — METOCLOPRAMIDE HCL 10 MG PO TABS: 5.0000 mg | ORAL_TABLET | Freq: Three times a day (TID) | ORAL | Status: DC | PRN

## 2017-06-04 MED ORDER — FENTANYL CITRATE (PF) 100 MCG/2ML IJ SOLN
50.0000 ug | Freq: Once | INTRAMUSCULAR | Status: AC
  Administered 2017-06-04: 50 ug via INTRAVENOUS

## 2017-06-04 MED ORDER — OXYCODONE HCL 5 MG PO TABS: 5.0000 mg | ORAL_TABLET | Freq: Once | ORAL | Status: DC | PRN

## 2017-06-04 MED ORDER — MAGNESIUM CITRATE PO SOLN
1.0000 | Freq: Once | ORAL | Status: AC | PRN
  Administered 2017-06-06: 1 via ORAL
  Filled 2017-06-04 (×5): qty 296

## 2017-06-04 MED ORDER — GABAPENTIN 300 MG PO CAPS
300.0000 mg | ORAL_CAPSULE | Freq: Once | ORAL | Status: AC
  Administered 2017-06-04: 300 mg via ORAL

## 2017-06-04 MED ORDER — SODIUM CHLORIDE FLUSH 0.9 % IV SOLN
INTRAVENOUS | Status: AC
  Filled 2017-06-04: qty 20

## 2017-06-04 MED ORDER — LOSARTAN POTASSIUM 50 MG PO TABS
50.0000 mg | ORAL_TABLET | Freq: Every day | ORAL | Status: DC
  Administered 2017-06-05 – 2017-06-06 (×2): 50 mg via ORAL
  Filled 2017-06-04 (×2): qty 1

## 2017-06-04 MED ORDER — ACETAMINOPHEN 325 MG PO TABS
ORAL_TABLET | ORAL | Status: AC
  Administered 2017-06-04: 1000 mg via ORAL
  Filled 2017-06-04: qty 3

## 2017-06-04 MED ORDER — FENTANYL CITRATE (PF) 100 MCG/2ML IJ SOLN
INTRAMUSCULAR | Status: AC
  Filled 2017-06-04: qty 2

## 2017-06-04 MED ORDER — GLYCOPYRROLATE 0.2 MG/ML IJ SOLN
INTRAMUSCULAR | Status: AC
  Filled 2017-06-04: qty 1

## 2017-06-04 MED ORDER — GLYCOPYRROLATE 0.2 MG/ML IJ SOLN
INTRAMUSCULAR | Status: DC | PRN
  Administered 2017-06-04: 0.1 mg via INTRAVENOUS

## 2017-06-04 MED ORDER — DEXAMETHASONE SODIUM PHOSPHATE 10 MG/ML IJ SOLN
INTRAMUSCULAR | Status: AC
  Filled 2017-06-04: qty 1

## 2017-06-04 MED ORDER — MIDAZOLAM HCL 5 MG/5ML IJ SOLN
INTRAMUSCULAR | Status: DC | PRN
  Administered 2017-06-04: 2 mg via INTRAVENOUS

## 2017-06-04 MED ORDER — INSULIN ASPART 100 UNIT/ML ~~LOC~~ SOLN
0.0000 [IU] | Freq: Three times a day (TID) | SUBCUTANEOUS | Status: DC
  Administered 2017-06-06: 2 [IU] via SUBCUTANEOUS
  Administered 2017-06-06: 1 [IU] via SUBCUTANEOUS
  Filled 2017-06-04 (×3): qty 1

## 2017-06-04 MED ORDER — DOCUSATE SODIUM 100 MG PO CAPS
100.0000 mg | ORAL_CAPSULE | Freq: Two times a day (BID) | ORAL | Status: DC
  Administered 2017-06-04 – 2017-06-06 (×5): 100 mg via ORAL
  Filled 2017-06-04 (×5): qty 1

## 2017-06-04 MED ORDER — BACITRACIN 50000 UNITS IM SOLR
INTRAMUSCULAR | Status: AC
  Filled 2017-06-04: qty 2

## 2017-06-04 MED ORDER — ONDANSETRON HCL 4 MG/2ML IJ SOLN
INTRAMUSCULAR | Status: DC | PRN
  Administered 2017-06-04: 4 mg via INTRAVENOUS

## 2017-06-04 MED ORDER — MIDAZOLAM HCL 2 MG/2ML IJ SOLN
INTRAMUSCULAR | Status: AC
  Administered 2017-06-04: 1 mg via INTRAVENOUS
  Filled 2017-06-04: qty 2

## 2017-06-04 MED ORDER — MORPHINE SULFATE (PF) 2 MG/ML IV SOLN
1.0000 mg | INTRAVENOUS | Status: DC | PRN
  Administered 2017-06-04: 1 mg via INTRAVENOUS
  Filled 2017-06-04: qty 1

## 2017-06-04 MED ORDER — OXYCODONE HCL 5 MG PO TABS
5.0000 mg | ORAL_TABLET | ORAL | Status: DC | PRN
  Administered 2017-06-04: 5 mg via ORAL
  Administered 2017-06-05 – 2017-06-06 (×7): 10 mg via ORAL
  Filled 2017-06-04: qty 2
  Filled 2017-06-04: qty 1
  Filled 2017-06-04 (×6): qty 2

## 2017-06-04 MED ORDER — BUPIVACAINE-EPINEPHRINE (PF) 0.5% -1:200000 IJ SOLN
INTRAMUSCULAR | Status: DC | PRN
  Administered 2017-06-04: 30 mL

## 2017-06-04 MED ORDER — BUPIVACAINE-EPINEPHRINE (PF) 0.5% -1:200000 IJ SOLN
INTRAMUSCULAR | Status: AC
  Filled 2017-06-04: qty 30

## 2017-06-04 MED ORDER — PANTOPRAZOLE SODIUM 40 MG PO TBEC
40.0000 mg | DELAYED_RELEASE_TABLET | Freq: Every day | ORAL | Status: DC
  Administered 2017-06-05 – 2017-06-06 (×2): 40 mg via ORAL
  Filled 2017-06-04 (×2): qty 1

## 2017-06-04 MED ORDER — FENTANYL CITRATE (PF) 100 MCG/2ML IJ SOLN
INTRAMUSCULAR | Status: DC | PRN
  Administered 2017-06-04 (×2): 50 ug via INTRAVENOUS

## 2017-06-04 MED ORDER — SODIUM CHLORIDE 0.9 % IV SOLN
INTRAVENOUS | Status: DC
  Administered 2017-06-04: 09:00:00 via INTRAVENOUS

## 2017-06-04 MED ORDER — PHENOL 1.4 % MT LIQD
1.0000 | OROMUCOSAL | Status: DC | PRN
  Filled 2017-06-04: qty 177

## 2017-06-04 MED ORDER — LACTATED RINGERS IV SOLN
INTRAVENOUS | Status: DC
  Administered 2017-06-04 (×2): via INTRAVENOUS

## 2017-06-04 MED ORDER — MIDAZOLAM HCL 2 MG/2ML IJ SOLN
1.0000 mg | Freq: Once | INTRAMUSCULAR | Status: AC
  Administered 2017-06-04: 1 mg via INTRAVENOUS

## 2017-06-04 MED ORDER — SODIUM CHLORIDE 0.9 % IV SOLN
INTRAVENOUS | Status: DC | PRN
  Administered 2017-06-04: 60 mL

## 2017-06-04 MED ORDER — LIDOCAINE HCL (PF) 1 % IJ SOLN
INTRAMUSCULAR | Status: AC
  Filled 2017-06-04: qty 5

## 2017-06-04 MED ORDER — ROPIVACAINE HCL 5 MG/ML IJ SOLN
INTRAMUSCULAR | Status: AC
  Filled 2017-06-04: qty 30

## 2017-06-04 MED ORDER — LIDOCAINE HCL (CARDIAC) 20 MG/ML IV SOLN
INTRAVENOUS | Status: DC | PRN
  Administered 2017-06-04: 40 mg via INTRAVENOUS

## 2017-06-04 MED ORDER — CHLORHEXIDINE GLUCONATE 4 % EX LIQD: 60.0000 mL | Freq: Once | CUTANEOUS | Status: DC

## 2017-06-04 MED ORDER — MENTHOL 3 MG MT LOZG
1.0000 | LOZENGE | OROMUCOSAL | Status: DC | PRN
  Filled 2017-06-04: qty 9

## 2017-06-04 MED ORDER — SODIUM CHLORIDE 0.9 % IV SOLN
INTRAVENOUS | Status: DC | PRN
  Administered 2017-06-04: 50 ug/min via INTRAVENOUS

## 2017-06-04 MED ORDER — ONDANSETRON HCL 4 MG/2ML IJ SOLN: 4.0000 mg | Freq: Four times a day (QID) | INTRAMUSCULAR | Status: DC | PRN

## 2017-06-04 MED ORDER — GABAPENTIN 300 MG PO CAPS
ORAL_CAPSULE | ORAL | Status: AC
  Administered 2017-06-04: 300 mg via ORAL
  Filled 2017-06-04: qty 1

## 2017-06-04 MED ORDER — ASPIRIN EC 325 MG PO TBEC
325.0000 mg | DELAYED_RELEASE_TABLET | Freq: Every day | ORAL | Status: DC
  Administered 2017-06-05 – 2017-06-06 (×2): 325 mg via ORAL
  Filled 2017-06-04 (×2): qty 1

## 2017-06-04 MED ORDER — PROPOFOL 10 MG/ML IV BOLUS
INTRAVENOUS | Status: AC
  Filled 2017-06-04: qty 20

## 2017-06-04 MED ORDER — SODIUM CHLORIDE 0.9 % IR SOLN
Status: DC | PRN
  Administered 2017-06-04: 10:00:00

## 2017-06-04 MED ORDER — ALPRAZOLAM 0.5 MG PO TABS
0.5000 mg | ORAL_TABLET | Freq: Every evening | ORAL | Status: DC | PRN
  Administered 2017-06-04 – 2017-06-05 (×2): 0.5 mg via ORAL
  Filled 2017-06-04 (×2): qty 1

## 2017-06-04 MED ORDER — BUPIVACAINE HCL (PF) 0.5 % IJ SOLN
INTRAMUSCULAR | Status: DC | PRN
  Administered 2017-06-04: 3 mL

## 2017-06-04 MED ORDER — ONDANSETRON HCL 4 MG/2ML IJ SOLN
INTRAMUSCULAR | Status: AC
  Filled 2017-06-04: qty 2

## 2017-06-04 MED ORDER — METOCLOPRAMIDE HCL 5 MG/ML IJ SOLN: 5.0000 mg | Freq: Three times a day (TID) | INTRAMUSCULAR | Status: DC | PRN

## 2017-06-04 MED ORDER — CEFAZOLIN SODIUM-DEXTROSE 2-4 GM/100ML-% IV SOLN
INTRAVENOUS | Status: AC
  Filled 2017-06-04: qty 100

## 2017-06-04 MED ORDER — LIDOCAINE HCL (PF) 1 % IJ SOLN
INTRAMUSCULAR | Status: DC | PRN
  Administered 2017-06-04: 1 mL via INTRADERMAL

## 2017-06-04 MED ORDER — ONDANSETRON HCL 4 MG PO TABS: 4.0000 mg | ORAL_TABLET | Freq: Four times a day (QID) | ORAL | Status: DC | PRN

## 2017-06-04 MED ORDER — ACETAMINOPHEN 500 MG PO TABS
1000.0000 mg | ORAL_TABLET | Freq: Once | ORAL | Status: AC
  Administered 2017-06-04: 1000 mg via ORAL

## 2017-06-04 MED ORDER — PROPOFOL 500 MG/50ML IV EMUL
INTRAVENOUS | Status: AC
  Filled 2017-06-04: qty 50

## 2017-06-04 MED ORDER — BUPIVACAINE LIPOSOME 1.3 % IJ SUSP
INTRAMUSCULAR | Status: AC
  Filled 2017-06-04: qty 20

## 2017-06-04 MED ORDER — ACETAMINOPHEN 325 MG PO TABS
650.0000 mg | ORAL_TABLET | Freq: Four times a day (QID) | ORAL | Status: DC | PRN
  Administered 2017-06-05 – 2017-06-06 (×3): 650 mg via ORAL
  Filled 2017-06-04 (×3): qty 2

## 2017-06-04 MED ORDER — METFORMIN HCL 850 MG PO TABS
850.0000 mg | ORAL_TABLET | Freq: Every day | ORAL | Status: DC
  Administered 2017-06-04 – 2017-06-06 (×3): 850 mg via ORAL
  Filled 2017-06-04 (×3): qty 1

## 2017-06-04 MED ORDER — FENTANYL CITRATE (PF) 100 MCG/2ML IJ SOLN
INTRAMUSCULAR | Status: AC
  Administered 2017-06-04: 50 ug via INTRAVENOUS
  Filled 2017-06-04: qty 2

## 2017-06-04 MED ORDER — INSULIN ASPART 100 UNIT/ML ~~LOC~~ SOLN: 0.0000 [IU] | Freq: Every day | SUBCUTANEOUS | Status: DC

## 2017-06-04 MED ORDER — DEXAMETHASONE SODIUM PHOSPHATE 4 MG/ML IJ SOLN
INTRAMUSCULAR | Status: DC | PRN
  Administered 2017-06-04: 10 mg via INTRAVENOUS

## 2017-06-04 MED ORDER — KETOROLAC TROMETHAMINE 15 MG/ML IJ SOLN
15.0000 mg | Freq: Four times a day (QID) | INTRAMUSCULAR | Status: DC | PRN
  Administered 2017-06-04 – 2017-06-06 (×6): 15 mg via INTRAVENOUS
  Filled 2017-06-04 (×7): qty 1

## 2017-06-04 MED ORDER — CEFAZOLIN SODIUM-DEXTROSE 1-4 GM/50ML-% IV SOLN
1.0000 g | Freq: Four times a day (QID) | INTRAVENOUS | Status: AC
  Administered 2017-06-04 (×2): 1 g via INTRAVENOUS
  Filled 2017-06-04 (×2): qty 50

## 2017-06-04 MED ORDER — FENTANYL CITRATE (PF) 100 MCG/2ML IJ SOLN: 25.0000 ug | INTRAMUSCULAR | Status: DC | PRN

## 2017-06-04 MED ORDER — BISACODYL 10 MG RE SUPP
10.0000 mg | Freq: Every day | RECTAL | Status: DC | PRN
  Administered 2017-06-06: 10 mg via RECTAL
  Filled 2017-06-04: qty 1

## 2017-06-04 MED ORDER — DULOXETINE HCL 30 MG PO CPEP
30.0000 mg | ORAL_CAPSULE | Freq: Every day | ORAL | Status: DC
  Administered 2017-06-05 – 2017-06-06 (×2): 30 mg via ORAL
  Filled 2017-06-04 (×2): qty 1

## 2017-06-04 MED ORDER — SODIUM CHLORIDE 0.9 % IJ SOLN
INTRAMUSCULAR | Status: AC
  Filled 2017-06-04: qty 50

## 2017-06-04 MED ORDER — ROPIVACAINE HCL 5 MG/ML IJ SOLN
INTRAMUSCULAR | Status: DC | PRN
  Administered 2017-06-04: 20 mL via PERINEURAL
  Administered 2017-06-04: 10 mL via PERINEURAL

## 2017-06-04 MED ORDER — LACTATED RINGERS IV SOLN
INTRAVENOUS | Status: DC
  Administered 2017-06-04: 14:00:00 via INTRAVENOUS

## 2017-06-04 MED ORDER — OXYCODONE HCL 5 MG/5ML PO SOLN: 5.0000 mg | Freq: Once | ORAL | Status: DC | PRN

## 2017-06-04 SURGICAL SUPPLY — 56 items
BLADE SAW 1 (BLADE) ×2 IMPLANT
BLADE SAW 1/2 (BLADE) ×2 IMPLANT
BOWL CEMENT MIX W/ADAPTER (MISCELLANEOUS) ×2 IMPLANT
BRUSH SCRUB EZ  4% CHG (MISCELLANEOUS) ×2
BRUSH SCRUB EZ 4% CHG (MISCELLANEOUS) ×1 IMPLANT
CANISTER SUCT 1200ML W/VALVE (MISCELLANEOUS) ×2 IMPLANT
CANISTER SUCT 3000ML PPV (MISCELLANEOUS) ×4 IMPLANT
CAP KNEE TOTAL 3 SIGMA ×2 IMPLANT
CEMENT BONE 1-PACK (Cement) ×4 IMPLANT
CHLORAPREP W/TINT 26ML (MISCELLANEOUS) ×2 IMPLANT
COOLER POLAR GLACIER W/PUMP (MISCELLANEOUS) ×2 IMPLANT
CUFF TOURN 30 STER DUAL PORT (MISCELLANEOUS) ×2 IMPLANT
DRAPE INCISE IOBAN 66X60 STRL (DRAPES) ×2 IMPLANT
DRAPE SHEET LG 3/4 BI-LAMINATE (DRAPES) ×2 IMPLANT
DRSG AQUACEL AG ADV 3.5X14 (GAUZE/BANDAGES/DRESSINGS) ×2 IMPLANT
ELECT REM PT RETURN 9FT ADLT (ELECTROSURGICAL) ×2
ELECTRODE REM PT RTRN 9FT ADLT (ELECTROSURGICAL) ×1 IMPLANT
GAUZE PETRO XEROFOAM 1X8 (MISCELLANEOUS) ×2 IMPLANT
GLOVE BIOGEL PI IND STRL 7.0 (GLOVE) ×8 IMPLANT
GLOVE BIOGEL PI INDICATOR 7.0 (GLOVE) ×8
GLOVE INDICATOR 8.0 STRL GRN (GLOVE) ×2 IMPLANT
GLOVE SURG ORTHO 8.0 STRL STRW (GLOVE) ×6 IMPLANT
GOWN STRL REUS W/ TWL LRG LVL3 (GOWN DISPOSABLE) ×3 IMPLANT
GOWN STRL REUS W/ TWL XL LVL3 (GOWN DISPOSABLE) ×1 IMPLANT
GOWN STRL REUS W/TWL LRG LVL3 (GOWN DISPOSABLE) ×6
GOWN STRL REUS W/TWL XL LVL3 (GOWN DISPOSABLE) ×2
HOLDER FOLEY CATH W/STRAP (MISCELLANEOUS) ×2 IMPLANT
HOOD PEEL AWAY FLYTE STAYCOOL (MISCELLANEOUS) ×6 IMPLANT
IV NS 1000ML (IV SOLUTION) ×2
IV NS 1000ML BAXH (IV SOLUTION) ×1 IMPLANT
KIT TURNOVER KIT A (KITS) ×2 IMPLANT
NDL SAFETY ECLIPSE 18X1.5 (NEEDLE) ×1 IMPLANT
NEEDLE FILTER BLUNT 18X 1/2SAF (NEEDLE) ×2
NEEDLE FILTER BLUNT 18X1 1/2 (NEEDLE) ×2 IMPLANT
NEEDLE HYPO 18GX1.5 SHARP (NEEDLE) ×2
NEEDLE SPNL 20GX3.5 QUINCKE YW (NEEDLE) ×2 IMPLANT
NS IRRIG 1000ML POUR BTL (IV SOLUTION) ×2 IMPLANT
PACK TOTAL KNEE (MISCELLANEOUS) ×2 IMPLANT
PAD DE MAYO PRESSURE PROTECT (MISCELLANEOUS) ×2 IMPLANT
PAD WRAPON POLAR KNEE (MISCELLANEOUS) ×1 IMPLANT
PULSAVAC PLUS IRRIG FAN TIP (DISPOSABLE)
SPONGE LAP 18X18 5 PK (GAUZE/BANDAGES/DRESSINGS) ×4 IMPLANT
STAPLER SKIN PROX 35W (STAPLE) ×2 IMPLANT
SUCTION FRAZIER HANDLE 10FR (MISCELLANEOUS) ×2
SUCTION TUBE FRAZIER 10FR DISP (MISCELLANEOUS) ×1 IMPLANT
SUT DVC 2 QUILL PDO  T11 36X36 (SUTURE) ×2
SUT DVC 2 QUILL PDO T11 36X36 (SUTURE) ×1 IMPLANT
SUT VIC AB 2-0 CT1 18 (SUTURE) ×2 IMPLANT
SUT VIC AB 2-0 CT1 27 (SUTURE)
SUT VIC AB 2-0 CT1 TAPERPNT 27 (SUTURE) IMPLANT
SUT VIC AB PLUS 45CM 1-MO-4 (SUTURE) ×2 IMPLANT
SYR 30ML LL (SYRINGE) ×6 IMPLANT
SYR BULB IRRIG 60ML STRL (SYRINGE) ×2 IMPLANT
TIP FAN IRRIG PULSAVAC PLUS (DISPOSABLE) IMPLANT
TRAY FOLEY CATH SILVER 16FR LF (SET/KITS/TRAYS/PACK) ×2 IMPLANT
WRAPON POLAR PAD KNEE (MISCELLANEOUS) ×2

## 2017-06-04 NOTE — Anesthesia Post-op Follow-up Note (Signed)
Anesthesia QCDR form completed.        

## 2017-06-04 NOTE — Transfer of Care (Signed)
Immediate Anesthesia Transfer of Care Note  Patient: Katherine Jimenez  Procedure(s) Performed: TOTAL KNEE ARTHROPLASTY (Right Knee)  Patient Location: PACU  Anesthesia Type:Spinal  Level of Consciousness: awake, alert  and oriented  Airway & Oxygen Therapy: Patient Spontanous Breathing and Patient connected to nasal cannula oxygen  Post-op Assessment: Report given to RN and Post -op Vital signs reviewed and stable  Post vital signs: Reviewed and stable  Last Vitals:  Vitals:   06/04/17 0914 06/04/17 0919  BP: 118/71 117/73  Pulse: 85 88  Resp: (!) 21 15  Temp:    SpO2: 96% 97%    Last Pain:  Vitals:   06/04/17 0838  TempSrc: Temporal         Complications: No apparent anesthesia complications

## 2017-06-04 NOTE — Progress Notes (Signed)
Chaplain responded to OR for AD. Pt was beginning to be treated. Ch. Educated Pt who wants to review with spouse. Ch left AD.   06/04/17 1600  Clinical Encounter Type  Visited With Patient  Visit Type Initial  Referral From Nurse  Spiritual Encounters  Spiritual Needs Brochure

## 2017-06-04 NOTE — Progress Notes (Signed)
Inpatient Diabetes Program Recommendations  AACE/ADA: New Consensus Statement on Inpatient Glycemic Control (2015)  Target Ranges:  Prepandial:   less than 140 mg/dL      Peak postprandial:   less than 180 mg/dL (1-2 hours)      Critically ill patients:  140 - 180 mg/dL  Results for LAYLANI, PUDWILL (MRN [id number]) as of 06/04/2017 14:13  Ref. Range 06/04/2017 08:43 06/04/2017 12:44  Glucose-Capillary Latest Ref Range: 65 - 99 mg/dL 148 (H) 123 (H)  Results for MERY, GUADALUPE (MRN [id number]) as of 06/04/2017 14:13  Ref. Range 05/04/2017 12:03  Hemoglobin A1C Latest Ref Range: 4.6 - 6.5 % 6.4    Review of Glycemic Control  Diabetes history: DM2 Outpatient Diabetes medications: Metformin 850 mg daily Current orders for Inpatient glycemic control: Metformin 850 mg QAM  Inpatient Diabetes Program Recommendations:  Correction (SSI): While inpatient, please consider ordering CBGs with Novolog correction scale 0-9 units TID with meals and 0-5 units QHS. HgbA1C: A1C 6.4% on 05/04/2017 indicating an average glucose of 137 mg/dl over the past 2-3 months.  NOTE: Noted consult for Diabetes Coordinator. Chart reviewed. Noted patient received Decadron 10 mg at 9:40 am today which is likely to contribute to hyperglycemia. While inpatient, recommend using Glycemic Control order set to order CBGs with Novolog 0-9 units TID with meals and 0-5 units QHS.  Thanks, Barnie Alderman, RN, MSN, CDE Diabetes Coordinator Inpatient Diabetes Program [phone] (Team Pager from 8am to 5pm)

## 2017-06-04 NOTE — Progress Notes (Signed)
Physical Therapy Evaluation Patient Details Name: Katherine Jimenez MRN: [id number] DOB: 19-Jul-1950 Today's Date: 06/04/2017   History of Present Illness  Pt underwent R TKR without reported post-op complications. She is POD#0 at time of PT evaluation. PMH includes HTN, anxiety, depression, bipolar disorder, DM, OA, fibromyalgia, and low back pain  Clinical Impression  Pt admitted with above diagnosis. Pt currently with functional limitations due to the deficits listed below (see PT Problem List).  Pt does very well for POD#0. She requires minA+1 for bed mobility to manage RLE. She is CGA only for transfers and limited ambulation. She is able to ambulate with short, shuffling steps from bed to recliner. Good weight acceptance to RLE. Pt is steady with UE support on rolling walker. She is able to complete all supine exercises as instructed. Pt will benefit from PT services to address deficits in strength, balance, and mobility in order to return to full function at home.     Follow Up Recommendations Home health PT    Equipment Recommendations  None recommended by PT    Recommendations for Other Services       Precautions / Restrictions Precautions Precautions: Knee;Fall Precaution Booklet Issued: Yes (comment) Restrictions Weight Bearing Restrictions: Yes RLE Weight Bearing: Weight bearing as tolerated      Mobility  Bed Mobility Overal bed mobility: Needs Assistance Bed Mobility: Supine to Sit     Supine to sit: Min assist     General bed mobility comments: Pt requires minA+1 to assist RLE when moving to EOB  Transfers Overall transfer level: Needs assistance Equipment used: Rolling walker (2 wheeled) Transfers: Sit to/from Stand Sit to Stand: Min guard         General transfer comment: Cues for safe hand placement and sequencing. Decreased weight shifting to RLE. Good stability in standing with UE support on rolling walker  Ambulation/Gait Ambulation/Gait  assistance: Min guard Ambulation Distance (Feet): 5 Feet Assistive device: Rolling walker (2 wheeled)   Gait velocity: Decreased   General Gait Details: Pt able to ambulate with short, shuffling steps from bed to recliner. Good weight acceptance to RLE. Pt is steady with UE support on rolling walker  Stairs            Wheelchair Mobility    Modified Rankin (Stroke Patients Only)       Balance Overall balance assessment: Needs assistance Sitting-balance support: No upper extremity supported Sitting balance-Leahy Scale: Good     Standing balance support: Bilateral upper extremity supported Standing balance-Leahy Scale: Fair                               Pertinent Vitals/Pain Pain Assessment: No/denies pain    Home Living Family/patient expects to be discharged to:: Private residence Living Arrangements: Spouse/significant other Available Help at Discharge: Family Type of Home: House Home Access: Stairs to enter Entrance Stairs-Rails: Left Entrance Stairs-Number of Steps: 6 Home Layout: One level Home Equipment: Walker - 2 wheels;Cane - single point;Bedside commode;Other (comment)(reacher, no shower chair)      Prior Function Level of Independence: Independent         Comments: Independent with ADLs/IADLs. Ambulates without assistive device. 2 falls in the last 12 months     Hand Dominance   Dominant Hand: Right    Extremity/Trunk Assessment   Upper Extremity Assessment Upper Extremity Assessment: Overall WFL for tasks assessed    Lower Extremity Assessment Lower Extremity Assessment: RLE deficits/detail  RLE Deficits / Details: 2 finger assist for R SLR. Independent with SAQ without assist. Full DF/PF. Pt reports intact light touch sensation but reports some persistent pain control from spinal in RLE       Communication   Communication: No difficulties  Cognition Arousal/Alertness: Awake/alert Behavior During Therapy: WFL for tasks  assessed/performed Overall Cognitive Status: Within Functional Limits for tasks assessed                                        General Comments      Exercises Total Joint Exercises Ankle Circles/Pumps: Both;10 reps Quad Sets: Both;10 reps Gluteal Sets: Both;10 reps Short Arc Quad: Right;10 reps Heel Slides: Right;10 reps Hip ABduction/ADduction: Right;10 reps Straight Leg Raises: Right;10 reps Goniometric ROM: 0-90 AAROM   Assessment/Plan    PT Assessment Patient needs continued PT services  PT Problem List Decreased strength;Decreased activity tolerance;Decreased mobility;Pain       PT Treatment Interventions DME instruction;Gait training;Stair training;Functional mobility training;Therapeutic activities;Therapeutic exercise;Balance training;Neuromuscular re-education;Patient/family education;Manual techniques    PT Goals (Current goals can be found in the Care Plan section)  Acute Rehab PT Goals Patient Stated Goal: Return to prior function at home PT Goal Formulation: With patient Time For Goal Achievement: 06/18/17 Potential to Achieve Goals: Good    Frequency BID   Barriers to discharge        Co-evaluation               AM-PAC PT "6 Clicks" Daily Activity  Outcome Measure Difficulty turning over in bed (including adjusting bedclothes, sheets and blankets)?: A Little Difficulty moving from lying on back to sitting on the side of the bed? : Unable Difficulty sitting down on and standing up from a chair with arms (e.g., wheelchair, bedside commode, etc,.)?: A Little Help needed moving to and from a bed to chair (including a wheelchair)?: A Little Help needed walking in hospital room?: A Little Help needed climbing 3-5 steps with a railing? : A Lot 6 Click Score: 15    End of Session Equipment Utilized During Treatment: Gait belt Activity Tolerance: Patient tolerated treatment well Patient left: in chair;with call bell/phone within  reach;with chair alarm set;with SCD's reapplied;Other (comment)(towel roll under heel, polar care in place) Nurse Communication: Mobility status PT Visit Diagnosis: Muscle weakness (generalized) (M62.81);Other abnormalities of gait and mobility (R26.89);Difficulty in walking, not elsewhere classified (R26.2);Pain Pain - Right/Left: Right Pain - part of body: Knee    Time: 4008-6761 PT Time Calculation (min) (ACUTE ONLY): 29 min   Charges:   PT Evaluation $PT Eval Low Complexity: 1 Low PT Treatments $Therapeutic Exercise: 8-22 mins   PT G Codes:        Lyndel Safe Klyn Kroening PT, DPT    Rosaleah Person 06/04/2017, 4:05 PM

## 2017-06-04 NOTE — Anesthesia Preprocedure Evaluation (Addendum)
Anesthesia Evaluation  Patient identified by MRN, date of birth, ID band Patient awake    Reviewed: Allergy & Precautions, H&P , NPO status , Patient's Chart, lab work & pertinent test results  History of Anesthesia Complications Negative for: history of anesthetic complications  Airway Mallampati: III  TM Distance: <3 FB Neck ROM: limited    Dental  (+) Chipped, Poor Dentition   Pulmonary neg pulmonary ROS, neg shortness of breath,           Cardiovascular Exercise Tolerance: Good hypertension, (-) angina(-) Past MI and (-) DOE      Neuro/Psych PSYCHIATRIC DISORDERS Anxiety Depression Bipolar Disorder  Neuromuscular disease    GI/Hepatic negative GI ROS, Neg liver ROS,   Endo/Other  diabetes, Type 2  Renal/GU      Musculoskeletal  (+) Arthritis , Fibromyalgia -  Abdominal   Peds  Hematology negative hematology ROS (+)   Anesthesia Other Findings Patient endorses baseline lumbar back pain  Past Medical History: No date: Arthritis     Comment:  back, knees, fingers No date: Bipolar disorder ([place]) No date: Bunion, left No date: Depression No date: Diabetes mellitus     Comment:  diet controlled.  No date: Hypertension No date: Lower back pain     Comment:  S/P fall No date: Squamous cell skin cancer, face     Comment:  [facility] Derm June 2013: Treadmill stress test negative for angina pectoris  Past Surgical History: No date: BLADDER AUGMENTATION 03/05/12: CARDIAC CATHETERIZATION     Comment:  no stents 07/19/2015: CATARACT EXTRACTION W/PHACO; Right     Comment:  Procedure: CATARACT EXTRACTION PHACO AND INTRAOCULAR               LENS PLACEMENT ([place]) right eye;  Surgeon: Ronnell Freshwater, MD;  Location: [place];  Service:              Ophthalmology;  Laterality: Right;  BORDERLINE DIABETIC -              oral meds 05/28/2017: CATARACT EXTRACTION W/PHACO; Left     Comment:   Procedure: CATARACT EXTRACTION PHACO AND INTRAOCULAR               LENS PLACEMENT ([place]) LEFT DIABETIC;  Surgeon: Leandrew Koyanagi, MD;  Location: [place];  Service:               Ophthalmology;  Laterality: Left;  Diabetic - oal meds 2012: JOINT REPLACEMENT     Comment:  bilateral hip No date: KNEE ARTHROSCOPY 2012: TOTAL HIP ARTHROPLASTY No date: TUBAL LIGATION No date: VAGINAL DELIVERY     Comment:  2     Reproductive/Obstetrics negative OB ROS                            Anesthesia Physical Anesthesia Plan  ASA: III  Anesthesia Plan: Spinal   Post-op Pain Management: GA combined w/ Regional for post-op pain   Induction:   PONV Risk Score and Plan:   Airway Management Planned: Natural Airway and Nasal Cannula  Additional Equipment:   Intra-op Plan:   Post-operative Plan:   Informed Consent: I have reviewed the patients History and Physical, chart, labs and discussed the procedure including the risks, benefits and alternatives for the proposed  anesthesia with the patient or authorized representative who has indicated his/her understanding and acceptance.   Dental Advisory Given  Plan Discussed with: Anesthesiologist, CRNA and Surgeon  Anesthesia Plan Comments: (Patient reports no bleeding problems and no anticoagulant use.  Plan for spinal with backup GA  Patient consented for risks of anesthesia including but not limited to:  - adverse reactions to medications - risk of bleeding, infection, nerve damage and headache - risk of failed spinal - damage to teeth, lips or other oral mucosa - sore throat or hoarseness - Damage to heart, brain, lungs or loss of life  Patient voiced understanding.)       Anesthesia Quick Evaluation

## 2017-06-04 NOTE — H&P (Signed)
The patient has been re-examined, and the chart reviewed, and there have been no interval changes to the documented history and physical.  Plan a right total knee replacement today.  Anesthesia is consulted regarding a peripheral nerve block for post-operative pain.  The risks, benefits, and alternatives have been discussed at length, and the patient is willing to proceed.     

## 2017-06-04 NOTE — Anesthesia Procedure Notes (Signed)
Anesthesia Regional Block: Adductor canal block   Pre-Anesthetic Checklist: ,, timeout performed, Correct Patient, Correct Site, Correct Laterality, Correct Procedure, Correct Position, site marked, Risks and benefits discussed,  Surgical consent,  Pre-op evaluation,  At surgeon's request and post-op pain management  Laterality: Lower and Right  Prep: chloraprep       Needles:  Injection technique: Single-shot  Needle Type: Echogenic Needle     Needle Length: 9cm  Needle Gauge: 21     Additional Needles:   Procedures:,,,, ultrasound used (permanent image in chart),,,,  Narrative:  Start time: 06/04/2017 9:12 AM End time: 06/04/2017 9:15 AM Injection made incrementally with aspirations every 5 mL.  Performed by: Personally  Anesthesiologist: Kordell Jafri, Precious Haws, MD  Additional Notes: Functioning IV was confirmed and monitors were applied.  A echogenic needle was used. Sterile prep,hand hygiene and sterile gloves were used. Minimal sedation used for procedure.   No paresthesia endorsed by patient during the procedure.  Negative aspiration and negative test dose prior to incremental administration of local anesthetic. The patient tolerated the procedure well with no immediate complications.

## 2017-06-04 NOTE — Anesthesia Procedure Notes (Signed)
Spinal  Patient location during procedure: OR Start time: 06/04/2017 9:29 AM End time: 06/04/2017 9:41 AM Staffing Anesthesiologist: Kadian Barcellos, Precious Haws, MD Resident/CRNA: Nile Riggs, CRNA Performed: anesthesiologist and resident/CRNA  Preanesthetic Checklist Completed: patient identified, site marked, surgical consent, pre-op evaluation, timeout performed, IV checked, risks and benefits discussed and monitors and equipment checked Spinal Block Patient position: sitting Prep: ChloraPrep Patient monitoring: heart rate, continuous pulse ox, blood pressure and cardiac monitor Approach: midline Location: L3-4 Injection technique: single-shot Needle Needle type: Whitacre and Introducer  Needle gauge: 24 G Needle length: 9 cm Assessment Sensory level: T10 Additional Notes Multiple attempts by CRNA then MDA.  Patient reports scoliosis in the OR which she did not report pre Op.  Negative paresthesia. Negative blood return. Positive free-flowing CSF. Expiration date of kit checked and confirmed. Patient tolerated procedure well, without complications.

## 2017-06-04 NOTE — Op Note (Signed)
DATE OF SURGERY:  06/04/2017 TIME: 11:58 AM  PATIENT NAME:  Katherine Jimenez   AGE: 67 y.o.    PRE-OPERATIVE DIAGNOSIS:  M17.11 Unilateral primary osteoarthritis, right knee  POST-OPERATIVE DIAGNOSIS:  Same  PROCEDURE:  Procedure(s): TOTAL KNEE ARTHROPLASTY  SURGEON:  Lovell Sheehan, MD   ASSISTANT:  Carlynn Spry, PA-C  OPERATIVE IMPLANTS: Depuy Sigma, Cruciate Retaining Femoral component size  2.5, Sigma Fixed Bearing Tray size 2.5, Patella polyethylene 3-peg oval button size 35 mm, with a 10 mm polyethylene Curve-Plus insert.   PREOPERATIVE INDICATIONS:  Katherine Jimenez is an 67 y.o. female who has a diagnosis of right knee osteoarthritis and elected for a right total knee arthroplasty after failing nonoperative treatment, including activity modification, pain medication, physical therapy and injections who has significant impairment of their activities of daily living.  Radiographs have demonstrated tricompartmental osteoarthritis joint space narrowing, osteophytes, subchondral sclerosis and cyst formation.  The risks, benefits, and alternatives were discussed at length including but not limited to the risks of infection, bleeding, nerve or blood vessel injury, knee stiffness, fracture, dislocation, loosening or failure of the hardware and the need for further surgery. Medical risks include but not limited to DVT and pulmonary embolism, myocardial infarction, stroke, pneumonia, respiratory failure and death. I discussed these risks with the patient in my office prior to the date of surgery. They understood these risks and were willing to proceed.  OPERATIVE FINDINGS AND UNIQUE ASPECTS OF THE CASE:  All three compartments with advanced and severe degenerative changes, large osteophytes and an abundance of synovial fluid. Significant deformity was also noted. A decision was made to proceed with total knee arthroplasty.   OPERATIVE DESCRIPTION:  The patient was brought to the operative  room and placed in a supine position after undergoing placement of a general anesthetic. IV antibiotics were given. Patient received tranexamic acid. The lower extremity was prepped and draped in the usual sterile fashion.  A time out was performed to verify the patient's name, date of birth, medical record number, correct site of surgery and correct procedure to be performed. The timeout was also used to confirm the patient received antibiotics and that appropriate instruments, implants and radiographs studies were available in the room.  The leg was elevated and exsanguinated with an Esmarch and the tourniquet was inflated to 265 mmHg.  A midline incision was made over the left knee.. A medial parapatellar arthrotomy was then made and the patella subluxed laterally and the knee was brought into 90 of flexion. Hoffa's fat pad along with the anterior cruciate ligament was resected and the medial joint line was exposed.  Attention was then turned to preparation of the patella. The thickness of the patella was measured with a caliper, the diameter measured with the patella templates.  The patella resection was then made with an oscillating saw using the patella cutting guide.  The 35 mm button fit appropriately.  3 peg holes for the patella component were then drilled.  The extramedullary tibial cutting guide was then placed using the anterior tibial crest and second ray of the foot as a reference.  The tibial cutting guide was adjusted to allow for appropriate posterior slope.  The tibial cutting block was pinned into position. The slotted stylus was used to measure the proximal tibial resection of 2 mm off the low medial side. Care was taken during the tibial resection to protect the medial and collateral ligaments.  The resected tibial bone was removed.  The distal femur  was resected using the TruMatch cutting guide.  Care was taken to protect the collateral ligaments during distal femoral resection.  The  distal femoral resection was performed with an oscillating saw. The femoral cutting guide was then removed. Extension gap was measured with a 10 mm spacer block and alignment and extension was confirmed using a long alignment rod. The femur was sized to be a 4. Rotation of the referencing guide was checked with the epicondylar axis and Whitesides line. Then the 4-in-1 cutting jig was then applied to the distal femur. A stylus was used to confirm that the anterior femur would not be notched.   Then the anterior, posterior and chamfer femoral cuts were then made with an oscillating saw.  All posterior osteophytes were removed.  The flexion gap was then measured with a flexion spacer block and long alignment rod and was found to be symmetric with the extension gap and perpendicular to mechanical axis of the tibia.  The proximal tibia plateau was then sized with trial trays. The best coverage was achieved with a size 2.5. This tibial tray was then pinned into position. The proximal tibia was then prepared with the reamer and keel punch.  After tibial preparation was completed, all trial components were inserted with polyethylene trials.  The knee was found to have excellent balance and full motion with a size 10 mm tibial polyethylene insert..    The trials were then placed. Knee was taken through a full range of motion and deemed to be stable with the trial components. All trial components were then removed.  The joint was copiously irrigated with pulse lavage.  The final total knee arthroplasty components were then cemented into place. The knee was held in extension while cement was allowed to cure.The knee was taken through a range of motion and the patella tracked well and the knee was again irrigated copiously.  The knee capsule was then injected with Exparel.  The medial arthrotomy was closed with #1 Vicryl and #2 Quill. The subcutaneous tissue closed with  2-0 vicryl, and skin approximated with staples.   A dry sterile and compressive dressing was applied.  A Polar Care was applied to the operative knee.  The patient was awakened and brought to the PACU in stable and satisfactory condition.  All sharp, lap and instrument counts were correct at the conclusion the case. I spoke with the patient's family in the postop consultation room to let them know the case had been performed without complication and the patient was stable in recovery room.   Total tourniquet time was 58 minutes.

## 2017-06-04 NOTE — Progress Notes (Signed)
Pt admitted to room 147 from PACU. Pt is A&Ox4, denies pain. Surgical dressing and polar care in place to right knee, foley draining.  Pt was oriented to room and pain medication regimen. DM coordinator made recommendations for SSI, Dr. Harlow Mares notified, orders received.   [place], Jerry Caras

## 2017-06-04 NOTE — NC FL2 (Signed)
[place] LEVEL OF CARE SCREENING TOOL     IDENTIFICATION  Patient Name: Katherine Jimenez Birthdate: [birth year] Sex: female Admission Date (Current Location): 06/04/2017  [place] and [place] Number:  Engineering geologist and Address:  [facility], [address] [place], [place] [address]      Provider Number: [id number]  Attending Physician Name and Address:  Lovell Sheehan, MD  Relative Name and Phone Number:       Current Level of Care: Hospital Recommended Level of Care: [place] Prior Approval Number:    Date Approved/Denied:   PASRR Number: ([id number])  Discharge Plan: SNF    Current Diagnoses: Patient Active Problem List   Diagnosis Date Noted  . Osteoarthritis of right knee 06/04/2017  . Cystitis 05/06/2017  . Coccygeal contusion, subsequent encounter 03/14/2016  . Idiopathic scoliosis 12/06/2015  . Chest pain 08/29/2015  . Insomnia secondary to depression with anxiety 05/29/2015  . Palpitations 03/02/2015  . Fibromyalgia 01/30/2015  . B12 deficiency 12/23/2014  . Diabetes mellitus without complication ([place]) [id number]  . Cystocele 08/24/2013  . Preoperative evaluation to rule out surgical contraindication 01/21/2013  . Overweight (BMI 25.0-29.9) 01/21/2013  . Hyperlipidemia 02/28/2012  . Degenerative lumbar spinal stenosis 09/24/2011  . Hypertension 08/31/2011  . Bipolar disorder ([place]) 08/31/2011  . Anemia 08/31/2011    Orientation RESPIRATION BLADDER Height & Weight     Self, Situation, Time, Place  Normal Continent Weight: 158 lb (71.7 kg) Height:  '5\' 1"'$  (154.9 cm)  BEHAVIORAL SYMPTOMS/MOOD NEUROLOGICAL BOWEL NUTRITION STATUS      Continent Diet(Diet: Carb Modified. )  AMBULATORY STATUS COMMUNICATION OF NEEDS Skin   Extensive Assist Verbally Surgical wounds(Incision: Right Knee. )                       Personal Care Assistance Level of Assistance  Bathing, Feeding, Dressing  Bathing Assistance: Limited assistance Feeding assistance: Independent Dressing Assistance: Limited assistance     Functional Limitations Info  Sight, Hearing, Speech Sight Info: Adequate Hearing Info: Adequate Speech Info: Adequate    SPECIAL CARE FACTORS FREQUENCY  PT (By licensed PT), OT (By licensed OT)     PT Frequency: (5) OT Frequency: (5)            Contractures      Additional Factors Info  Code Status, Allergies Code Status Info: (Full Code. ) Allergies Info: (Thimerosal)           Current Medications (06/04/2017):  This is the current hospital active medication list Current Facility-Administered Medications  Medication Dose Route Frequency Provider Last Rate Last Dose  . acetaminophen (TYLENOL) tablet 650 mg  650 mg Oral Q6H PRN Lovell Sheehan, MD      . ALPRAZolam Duanne Moron) tablet 0.5 mg  0.5 mg Oral QHS PRN Lovell Sheehan, MD      . Derrill Memo ON 06/05/2017] aspirin EC tablet 325 mg  325 mg Oral Q breakfast Lovell Sheehan, MD      . bisacodyl (DULCOLAX) suppository 10 mg  10 mg Rectal Daily PRN Lovell Sheehan, MD      . ceFAZolin (ANCEF) IVPB 1 g/50 mL premix  1 g Intravenous Q6H Lovell Sheehan, MD   Stopped at 06/04/17 1622  . docusate sodium (COLACE) capsule 100 mg  100 mg Oral BID Lovell Sheehan, MD   100 mg at 06/04/17 1550  . [START ON 06/05/2017] DULoxetine (CYMBALTA) DR capsule 30 mg  30 mg Oral Daily Lovell Sheehan, MD      . insulin aspart (novoLOG) injection 0-5 Units  0-5 Units Subcutaneous QHS Lovell Sheehan, MD      . insulin aspart (novoLOG) injection 0-9 Units  0-9 Units Subcutaneous TID WC Lovell Sheehan, MD      . ketorolac (TORADOL) 15 MG/ML injection 15 mg  15 mg Intravenous Q6H PRN Lovell Sheehan, MD      . lactated ringers infusion   Intravenous Continuous Lovell Sheehan, MD 75 mL/hr at 06/04/17 1401    . lamoTRIgine (LAMICTAL) tablet 200 mg  200 mg Oral BID Lovell Sheehan, MD      . losartan (COZAAR) tablet 50 mg  50 mg Oral Daily  Lovell Sheehan, MD      . magnesium citrate solution 1 Bottle  1 Bottle Oral Once PRN Lovell Sheehan, MD      . menthol-cetylpyridinium (CEPACOL) lozenge 3 mg  1 lozenge Oral PRN Lovell Sheehan, MD       Or  . phenol (CHLORASEPTIC) mouth spray 1 spray  1 spray Mouth/Throat PRN Lovell Sheehan, MD      . metFORMIN (GLUCOPHAGE) tablet 850 mg  850 mg Oral Q breakfast Lovell Sheehan, MD      . metoCLOPramide (REGLAN) tablet 5-10 mg  5-10 mg Oral Q8H PRN Lovell Sheehan, MD       Or  . metoCLOPramide (REGLAN) injection 5-10 mg  5-10 mg Intravenous Q8H PRN Lovell Sheehan, MD      . morphine 2 MG/ML injection 1 mg  1 mg Intravenous Q2H PRN Lovell Sheehan, MD      . ondansetron [facility]) tablet 4 mg  4 mg Oral Q6H PRN Lovell Sheehan, MD       Or  . ondansetron [facility]) injection 4 mg  4 mg Intravenous Q6H PRN Lovell Sheehan, MD      . oxyCODONE (Oxy IR/ROXICODONE) immediate release tablet 5-10 mg  5-10 mg Oral Q3H PRN Lovell Sheehan, MD      . Derrill Memo ON 06/05/2017] pantoprazole (PROTONIX) EC tablet 40 mg  40 mg Oral Daily Lovell Sheehan, MD         Discharge Medications: Please see discharge summary for a list of discharge medications.  Relevant Imaging Results:  Relevant Lab Results:   Additional Information (SSN: [id number])  [name], Veronia Beets, LCSW

## 2017-06-05 ENCOUNTER — Encounter: Payer: Self-pay | Admitting: Orthopedic Surgery

## 2017-06-05 LAB — CBC
HCT: 32.1 % — ABNORMAL LOW (ref 35.0–47.0)
Hemoglobin: 10.7 g/dL — ABNORMAL LOW (ref 12.0–16.0)
MCH: 30 pg (ref 26.0–34.0)
MCHC: 33.2 g/dL (ref 32.0–36.0)
MCV: 90.4 fL (ref 80.0–100.0)
Platelets: 271 10*3/uL (ref 150–440)
RBC: 3.55 MIL/uL — ABNORMAL LOW (ref 3.80–5.20)
RDW: 14.1 % (ref 11.5–14.5)
WBC: 8.8 10*3/uL (ref 3.6–11.0)

## 2017-06-05 LAB — GLUCOSE, CAPILLARY
Glucose-Capillary: 114 mg/dL — ABNORMAL HIGH (ref 65–99)
Glucose-Capillary: 83 mg/dL (ref 65–99)
Glucose-Capillary: 100 mg/dL — ABNORMAL HIGH (ref 65–99)
Glucose-Capillary: 122 mg/dL — ABNORMAL HIGH (ref 65–99)

## 2017-06-05 LAB — TYPE AND SCREEN
ABO/RH(D): A NEG
Antibody Screen: POSITIVE
Unit division: 0
Unit division: 0

## 2017-06-05 LAB — BPAM RBC
Blood Product Expiration Date: 201903152359
Blood Product Expiration Date: 201903152359
Unit Type and Rh: 9500
Unit Type and Rh: 9500

## 2017-06-05 LAB — BASIC METABOLIC PANEL
Anion gap: 9 (ref 5–15)
BUN: 9 mg/dL (ref 6–20)
CO2: 25 mmol/L (ref 22–32)
Calcium: 8.6 mg/dL — ABNORMAL LOW (ref 8.9–10.3)
Chloride: 102 mmol/L (ref 101–111)
Creatinine, Ser: 0.71 mg/dL (ref 0.44–1.00)
GFR calc Af Amer: >60 mL/min (ref >60)
GFR calc non Af Amer: >60 mL/min (ref >60)
Glucose, Bld: 144 mg/dL — ABNORMAL HIGH (ref 65–99)
Potassium: 4 mmol/L (ref 3.5–5.1)
Sodium: 136 mmol/L (ref 135–145)

## 2017-06-05 MED ORDER — FAMOTIDINE 20 MG PO TABS: 20.0000 mg | ORAL_TABLET | Freq: Two times a day (BID) | ORAL | Status: DC | PRN

## 2017-06-05 MED ORDER — ALUM & MAG HYDROXIDE-SIMETH 200-200-20 MG/5ML PO SUSP
15.0000 mL | ORAL | Status: DC | PRN
  Administered 2017-06-05: 15 mL via ORAL
  Filled 2017-06-05: qty 30

## 2017-06-05 NOTE — Progress Notes (Signed)
Physical Therapy Treatment Patient Details Name: Katherine Jimenez MRN: [id number] DOB: 1950-07-29 Today's Date: 06/05/2017    History of Present Illness Pt underwent R TKR without reported post-op complications. She is POD#0 at time of PT evaluation. PMH includes HTN, anxiety, depression, bipolar disorder, DM, OA, fibromyalgia, and low back pain    PT Comments    Pt requesting to use bathroom upon arrival.  Min a to transition out of bed and to stand then she was able to ambulate to bathroom with walker and min guard.  After independent self care, she continued in hallway then back to recliner an additional 60.  Participated in exercises as described below.  Overall tolerated very well with steady gait and no buckling.  Limited by general fatigue and pain.    Will need full review and demo of HEP during this afternoon session as she was too tired to complete in the morning.  Remained out of bed after session.   Follow Up Recommendations  Home health PT     Equipment Recommendations  None recommended by PT    Recommendations for Other Services       Precautions / Restrictions Precautions Precautions: Knee;Fall Restrictions Weight Bearing Restrictions: Yes RLE Weight Bearing: Weight bearing as tolerated    Mobility  Bed Mobility Overal bed mobility: Needs Assistance Bed Mobility: Supine to Sit     Supine to sit: Min assist     General bed mobility comments: Pt requires minA+1 to assist RLE when moving to EOB  Transfers Overall transfer level: Needs assistance   Transfers: Sit to/from Stand Sit to Stand: Min guard            Ambulation/Gait Ambulation/Gait assistance: Min guard Ambulation Distance (Feet): 60 Feet Assistive device: Rolling walker (2 wheeled) Gait Pattern/deviations: Step-to pattern   Gait velocity interpretation: Below normal speed for age/gender     Stairs            Wheelchair Mobility    Modified Rankin (Stroke Patients Only)        Balance Overall balance assessment: Needs assistance Sitting-balance support: No upper extremity supported Sitting balance-Leahy Scale: Good     Standing balance support: Bilateral upper extremity supported Standing balance-Leahy Scale: Fair                              Cognition Arousal/Alertness: Awake/alert Behavior During Therapy: WFL for tasks assessed/performed Overall Cognitive Status: Within Functional Limits for tasks assessed                                        Exercises Total Joint Exercises Quad Sets: Both;10 reps Long Arc Quad: AAROM;Left;10 reps Knee Flexion: 10 reps;AAROM;Left Goniometric ROM: 0-87    General Comments        Pertinent Vitals/Pain Pain Assessment: 0-10 Pain Score: 3  Pain Location: left calf - no redness, significant edema or pain with dorsiflexion Pain Descriptors / Indicators: Sore Pain Intervention(s): Limited activity within patient's tolerance;Monitored during session    Home Living                      Prior Function            PT Goals (current goals can now be found in the care plan section) Progress towards PT goals: Progressing toward goals  Frequency    BID      PT Plan Current plan remains appropriate    Co-evaluation              AM-PAC PT "6 Clicks" Daily Activity  Outcome Measure  Difficulty turning over in bed (including adjusting bedclothes, sheets and blankets)?: A Little Difficulty moving from lying on back to sitting on the side of the bed? : A Little Difficulty sitting down on and standing up from a chair with arms (e.g., wheelchair, bedside commode, etc,.)?: A Little Help needed moving to and from a bed to chair (including a wheelchair)?: A Little Help needed walking in hospital room?: A Little Help needed climbing 3-5 steps with a railing? : A Little 6 Click Score: 18    End of Session Equipment Utilized During Treatment: Gait belt Activity  Tolerance: Patient tolerated treatment well Patient left: in chair;with call bell/phone within reach;with chair alarm set;with family/visitor present   Pain - Right/Left: Right Pain - part of body: Knee     Time: 1122-1140 PT Time Calculation (min) (ACUTE ONLY): 18 min  Charges:  $Gait Training: 8-22 mins                    G Codes:      Chesley Noon, PTA 06/05/17, 1:29 PM

## 2017-06-05 NOTE — Anesthesia Postprocedure Evaluation (Signed)
Anesthesia Post Note  Patient: Katherine Jimenez  Procedure(s) Performed: TOTAL KNEE ARTHROPLASTY (Right Knee)  Patient location during evaluation: Nursing Unit Anesthesia Type: Spinal Level of consciousness: oriented and awake and alert Pain management: pain level controlled Vital Signs Assessment: post-procedure vital signs reviewed and stable Respiratory status: spontaneous breathing, respiratory function stable and patient connected to nasal cannula oxygen Cardiovascular status: blood pressure returned to baseline and stable Postop Assessment: no headache, no backache and no apparent nausea or vomiting Anesthetic complications: no     Last Vitals:  Vitals:   06/05/17 0454 06/05/17 0913  BP: 113/70 130/78  Pulse: 91 84  Resp: 19 16  Temp: 37.1 C 36.7 C  SpO2: 97% 96%    Last Pain:  Vitals:   06/05/17 0943  TempSrc:   PainSc: 1                  [name],  Clearnce Sorrel

## 2017-06-05 NOTE — Progress Notes (Signed)
Subjective:  Patient reports pain as moderate.    Objective:   VITALS:   Vitals:   06/04/17 1747 06/04/17 2019 06/05/17 0007 06/05/17 0454  BP: (!) 158/93 (!) 143/85 110/67 113/70  Pulse: 80 (!) 109 98 91  Resp: 17 18 18 19   Temp: 98.7 F (37.1 C) 99 F (37.2 C) 98.4 F (36.9 C) 98.7 F (37.1 C)  TempSrc: Oral Oral Oral Oral  SpO2: 95% 94% 93% 97%  Weight:      Height:        PHYSICAL EXAM:  Neurovascular intact Dorsiflexion/Plantar flexion intact Incision: no drainage Compartment soft  LABS  Results for orders placed or performed during the hospital encounter of 06/04/17 (from the past 24 hour(s))  Type and screen [place]     Status: None   Collection Time: 06/04/17  8:43 AM  Result Value Ref Range   ABO/RH(D) A NEG    Antibody Screen POS    Sample Expiration 06/07/2017    Antibody Identification ANTI JKA (Kidd a)    Unit Number [id number]    Blood Component Type RED CELLS,LR    Unit division 00    Status of Unit REL FROM [facility]    Transfusion Status OK TO TRANSFUSE    Crossmatch Result      COMPATIBLE Performed at [facility], [address] [place], [place] [address]    Unit Number [id number]    Blood Component Type RED CELLS,LR    Unit division 00    Status of Unit REL FROM [facility]    Transfusion Status OK TO TRANSFUSE    Crossmatch Result COMPATIBLE   Glucose, capillary     Status: Abnormal   Collection Time: 06/04/17  8:43 AM  Result Value Ref Range   Glucose-Capillary 148 (H) 65 - 99 mg/dL  ABO/Rh     Status: None   Collection Time: 06/04/17  8:57 AM  Result Value Ref Range   ABO/RH(D)      A NEG Performed at [facility], [address] [place], [place] [address]   Glucose, capillary     Status: Abnormal   Collection Time: 06/04/17 12:44 PM  Result Value Ref Range   Glucose-Capillary 123 (H) 65 - 99 mg/dL  Glucose, capillary     Status: Abnormal   Collection Time: 06/04/17  4:30 PM   Result Value Ref Range   Glucose-Capillary 166 (H) 65 - 99 mg/dL  Glucose, capillary     Status: Abnormal   Collection Time: 06/04/17  9:10 PM  Result Value Ref Range   Glucose-Capillary 172 (H) 65 - 99 mg/dL   Comment 1 Notify RN   Basic metabolic panel     Status: Abnormal   Collection Time: 06/05/17  3:32 AM  Result Value Ref Range   Sodium 136 135 - 145 mmol/L   Potassium 4.0 3.5 - 5.1 mmol/L   Chloride 102 101 - 111 mmol/L   CO2 25 22 - 32 mmol/L   Glucose, Bld 144 (H) 65 - 99 mg/dL   BUN 9 6 - 20 mg/dL   Creatinine, Ser 0.71 0.44 - 1.00 mg/dL   Calcium 8.6 (L) 8.9 - 10.3 mg/dL   GFR calc non Af Amer >60 >60 mL/min   GFR calc Af Amer >60 >60 mL/min   Anion gap 9 5 - 15  CBC     Status: Abnormal   Collection Time: 06/05/17  3:32 AM  Result Value Ref Range   WBC  8.8 3.6 - 11.0 K/uL   RBC 3.55 (L) 3.80 - 5.20 MIL/uL   Hemoglobin 10.7 (L) 12.0 - 16.0 g/dL   HCT 32.1 (L) 35.0 - 47.0 %   MCV 90.4 80.0 - 100.0 fL   MCH 30.0 26.0 - 34.0 pg   MCHC 33.2 32.0 - 36.0 g/dL   RDW 14.1 11.5 - 14.5 %   Platelets 271 150 - 440 K/uL    Dg Knee Right Port  Result Date: 06/04/2017 CLINICAL DATA:  Postop right knee radiographs. EXAM: PORTABLE RIGHT KNEE - 1-2 VIEW COMPARISON:  Right knee MRI, 04/17/2010 FINDINGS: Right knee prosthetic components appear well seated and well aligned. There is no acute fracture or evidence of an operative complication. IMPRESSION: 1. Well-positioned total right knee arthroplasty. Electronically Signed   By: Lajean Manes M.D.   On: 06/04/2017 12:16    Assessment/Plan: 1 Day Post-Op   Active Problems:   Osteoarthritis of right knee   Advance diet Up with therapy D/C IV fluids  Discharge planning   Lovell Sheehan , MD 06/05/2017, 7:44 AM

## 2017-06-05 NOTE — Progress Notes (Signed)
Clinical Social Worker (CSW) received SNF consult. PT is recommending home health. RN case manager aware of above. Please reconsult if future social work needs arise. CSW signing off.   Elea Holtzclaw, LCSW [phone] 

## 2017-06-05 NOTE — Progress Notes (Signed)
Physical Therapy Treatment Patient Details Name: Katherine Jimenez MRN: [id number] DOB: 04-Feb-1951 Today's Date: 06/05/2017    History of Present Illness Pt underwent R TKR without reported post-op complications. She is POD#0 at time of PT evaluation. PMH includes HTN, anxiety, depression, bipolar disorder, DM, OA, fibromyalgia, and low back pain    PT Comments    Pt agreeable to PT; reports 7/10 pain in R knee. Pt participates in exercise and stretching of R knee. Re educated in STS transfers and reciprocal gait pattern for improved gait. Pt needs continued reinforcement for improved use of RLE in transfers and gait. Will need stair training prior to discharge.    Follow Up Recommendations  Home health PT     Equipment Recommendations  None recommended by PT    Recommendations for Other Services       Precautions / Restrictions Precautions Precautions: Knee;Fall Restrictions Weight Bearing Restrictions: Yes RLE Weight Bearing: Weight bearing as tolerated    Mobility  Bed Mobility Overal bed mobility: Needs Assistance Bed Mobility: Supine to Sit     Supine to sit: Min assist     General bed mobility comments: Pt requires minA+1 to assist RLE when moving to EOB  Transfers Overall transfer level: Needs assistance Equipment used: Rolling walker (2 wheeled) Transfers: Sit to/from Stand Sit to Stand: Min guard         General transfer comment: cues for increased use of RLE  Ambulation/Gait Ambulation/Gait assistance: Min guard Ambulation Distance (Feet): 60 Feet Assistive device: Rolling walker (2 wheeled) Gait Pattern/deviations: Step-to pattern   Gait velocity interpretation: Below normal speed for age/gender General Gait Details: Rw lowered from appropriate fit. cues for reciprocal pattern   Stairs            Wheelchair Mobility    Modified Rankin (Stroke Patients Only)       Balance Overall balance assessment: Needs assistance Sitting-balance  support: No upper extremity supported Sitting balance-Leahy Scale: Good     Standing balance support: Bilateral upper extremity supported Standing balance-Leahy Scale: Fair                              Cognition Arousal/Alertness: Awake/alert(but fatigued from lack of sleep last night) Behavior During Therapy: WFL for tasks assessed/performed Overall Cognitive Status: Within Functional Limits for tasks assessed                                        Exercises Total Joint Exercises Ankle Circles/Pumps: AROM;Both;20 reps Quad Sets: Strengthening;Both;20 reps Long Arc Quad: AROM;Strengthening;Right;15 reps;Seated Knee Flexion: 10 reps;AAROM;Left Goniometric ROM: 58 flex    General Comments        Pertinent Vitals/Pain Pain Assessment: 0-10 Pain Score: 7  Pain Location: R knee Pain Descriptors / Indicators: Sore Pain Intervention(s): Limited activity within patient's tolerance;Monitored during session;Ice applied;Premedicated before session    Home Living                      Prior Function            PT Goals (current goals can now be found in the care plan section) Progress towards PT goals: Progressing toward goals    Frequency    BID      PT Plan Current plan remains appropriate    Co-evaluation  AM-PAC PT "6 Clicks" Daily Activity  Outcome Measure  Difficulty turning over in bed (including adjusting bedclothes, sheets and blankets)?: A Little Difficulty moving from lying on back to sitting on the side of the bed? : A Little Difficulty sitting down on and standing up from a chair with arms (e.g., wheelchair, bedside commode, etc,.)?: A Little Help needed moving to and from a bed to chair (including a wheelchair)?: A Little Help needed walking in hospital room?: A Little Help needed climbing 3-5 steps with a railing? : A Little 6 Click Score: 18    End of Session Equipment Utilized During  Treatment: Gait belt Activity Tolerance: Patient tolerated treatment well Patient left: in chair;with call bell/phone within reach;with chair alarm set;with family/visitor present   PT Visit Diagnosis: Muscle weakness (generalized) (M62.81);Other abnormalities of gait and mobility (R26.89);Difficulty in walking, not elsewhere classified (R26.2);Pain Pain - Right/Left: Right Pain - part of body: Knee     Time: [id number] PT Time Calculation (min) (ACUTE ONLY): 28 min  Charges:  $Gait Training: 8-22 mins $Therapeutic Exercise: 8-22 mins                    G Codes:        Larae Grooms, PTA 06/05/2017, 4:45 PM

## 2017-06-05 NOTE — Care Management Note (Signed)
Case Management Note  Patient Details  Name: Katherine Jimenez MRN: [id number] Date of Birth: 09/17/1950  Subjective/Objective:  Met with patient at bedside to discuss discharge planning. Patient Lives at home with her spouse who will be her caregiver at home. She has a walker and bsc. Pt recommending home health PT. She has an outpatient appointment set up at Dr. Bowers office next week. Discussed benefits of both. Patient would like to go to outpatient PT at Dr. Bowers office.                   Action/Plan: OP PT at Dr. Bowers office.   Expected Discharge Date:  06/06/17               Expected Discharge Plan:  Home/Self Care  In-House Referral:     Discharge planning Services  CM Consult  Post Acute Care Choice:    Choice offered to:  Patient  DME Arranged:    DME Agency:     HH Arranged:    HH Agency:     Status of Service:  In process, will continue to follow  If discussed at Long Length of Stay Meetings, dates discussed:    Additional Comments:   M , RN 06/05/2017, 10:05 AM  

## 2017-06-06 ENCOUNTER — Encounter: Payer: Self-pay | Admitting: Orthopedic Surgery

## 2017-06-06 LAB — CBC
HCT: 33.5 % — ABNORMAL LOW (ref 35.0–47.0)
Hemoglobin: 10.9 g/dL — ABNORMAL LOW (ref 12.0–16.0)
MCH: 29.8 pg (ref 26.0–34.0)
MCHC: 32.6 g/dL (ref 32.0–36.0)
MCV: 91.5 fL (ref 80.0–100.0)
Platelets: 241 10*3/uL (ref 150–440)
RBC: 3.66 MIL/uL — ABNORMAL LOW (ref 3.80–5.20)
RDW: 14.4 % (ref 11.5–14.5)
WBC: 6.4 10*3/uL (ref 3.6–11.0)

## 2017-06-06 LAB — GLUCOSE, CAPILLARY
Glucose-Capillary: 107 mg/dL — ABNORMAL HIGH (ref 65–99)
Glucose-Capillary: 140 mg/dL — ABNORMAL HIGH (ref 65–99)
Glucose-Capillary: 157 mg/dL — ABNORMAL HIGH (ref 65–99)

## 2017-06-06 MED ORDER — CYCLOBENZAPRINE HCL 10 MG PO TABS
10.0000 mg | ORAL_TABLET | Freq: Two times a day (BID) | ORAL | Status: DC | PRN
  Administered 2017-06-06 (×2): 10 mg via ORAL
  Filled 2017-06-06 (×2): qty 1

## 2017-06-06 MED ORDER — OXYCODONE HCL 5 MG PO TABS: 5.0000 mg | ORAL_TABLET | ORAL | 0 refills | Status: DC | PRN

## 2017-06-06 MED ORDER — DIPHENHYDRAMINE HCL 25 MG PO CAPS
25.0000 mg | ORAL_CAPSULE | Freq: Every evening | ORAL | Status: DC | PRN
Start: 2017-06-06 — End: 2017-06-06
  Administered 2017-06-06: 50 mg via ORAL
  Filled 2017-06-06: qty 2

## 2017-06-06 MED ORDER — KETOROLAC TROMETHAMINE 10 MG PO TABS: 10.0000 mg | ORAL_TABLET | Freq: Four times a day (QID) | ORAL | 0 refills | Status: DC | PRN

## 2017-06-06 MED ORDER — ASPIRIN 325 MG PO TBEC: 325.0000 mg | DELAYED_RELEASE_TABLET | Freq: Every day | ORAL | 0 refills | Status: DC

## 2017-06-06 MED ORDER — DOCUSATE SODIUM 100 MG PO CAPS: 100.0000 mg | ORAL_CAPSULE | Freq: Two times a day (BID) | ORAL | 0 refills | Status: DC

## 2017-06-06 MED ORDER — CYCLOBENZAPRINE HCL 10 MG PO TABS: 10.0000 mg | ORAL_TABLET | Freq: Two times a day (BID) | ORAL | 0 refills | Status: DC | PRN

## 2017-06-06 NOTE — Discharge Summary (Signed)
Physician Discharge Summary  Patient ID: Katherine Jimenez MRN: [id number] DOB/AGE: [birth year] 67 y.o.  Admit date: 06/04/2017 Discharge date: 06/06/2017  Admission Diagnoses:  M17.11 Unilateral primary osteoarthritis, right knee <principal problem not specified>  Discharge Diagnoses:  M17.11 Unilateral primary osteoarthritis, right knee Active Problems:   Osteoarthritis of right knee   Past Medical History:  Diagnosis Date  . Arthritis    back, knees, fingers  . Bipolar disorder ([place])   . Bunion, left   . Depression   . Diabetes mellitus    diet controlled.   . Hypertension   . Lower back pain    S/P fall  . Squamous cell skin cancer, face    [facility]  . Treadmill stress test negative for angina pectoris June 2013    Surgeries: Procedure(s): TOTAL KNEE ARTHROPLASTY on 06/04/2017   Consultants (if any):   Discharged Condition: Improved  Hospital Course: Katherine Jimenez is an 67 y.o. female who was admitted 06/04/2017 with a diagnosis of  M17.11 Unilateral primary osteoarthritis, right knee <principal problem not specified> and went to the operating room on 06/04/2017 and underwent the above named procedures.    She was given perioperative antibiotics:  Anti-infectives (From admission, onward)   Start     Dose/Rate Route Frequency Ordered Stop   06/04/17 1600  ceFAZolin (ANCEF) IVPB 1 g/50 mL premix     1 g 100 mL/hr over 30 Minutes Intravenous Every 6 hours 06/04/17 1335 06/04/17 2133   06/04/17 1009  bacitracin 50,000 Units in sodium chloride irrigation 0.9 % 500 mL irrigation  Status:  Discontinued       As needed 06/04/17 1021 06/04/17 1137   06/04/17 0848  ceFAZolin (ANCEF) 2-4 GM/100ML-% IVPB    Comments:  Phineas Real   : cabinet override      06/04/17 0848 06/04/17 0952   06/04/17 0600  ceFAZolin (ANCEF) IVPB 2g/100 mL premix     2 g 200 mL/hr over 30 Minutes Intravenous On call to O.R. 06/03/17 2157 06/04/17 3300    .  She was given sequential compression  devices, early ambulation, and ECASA for DVT prophylaxis.  She benefited maximally from the hospital stay and there were no complications.    Recent vital signs:  Vitals:   06/05/17 2106 06/06/17 0830  BP: 128/69 (!) 149/73  Pulse: 79 79  Resp: 18 18  Temp:  98 F (36.7 C)  SpO2: 95% 97%    Recent laboratory studies:  Lab Results  Component Value Date   HGB 10.9 (L) 06/06/2017   HGB 10.7 (L) 06/05/2017   HGB 13.6 05/23/2017   Lab Results  Component Value Date   WBC 6.4 06/06/2017   PLT 241 06/06/2017   Lab Results  Component Value Date   INR 1.05 05/23/2017   Lab Results  Component Value Date   NA 136 06/05/2017   K 4.0 06/05/2017   CL 102 06/05/2017   CO2 25 06/05/2017   BUN 9 06/05/2017   CREATININE 0.71 06/05/2017   GLUCOSE 144 (H) 06/05/2017    Discharge Medications:   Allergies as of 06/06/2017      Reactions   Thimerosal Itching      Medication List    STOP taking these medications   naproxen 250 MG tablet Commonly known as:  NAPROSYN     TAKE these medications   acetaminophen 500 MG tablet Commonly known as:  TYLENOL Take 1,000 mg by mouth every 6 (six) hours as needed (FOR PAIN.).  ALPRAZolam 0.5 MG tablet Commonly known as:  XANAX TAKE 1 TABLET BY MOUTH AT BEDITME AS NEEDED What changed:  See the new instructions.   aspirin 325 MG EC tablet Take 1 tablet (325 mg total) by mouth daily with breakfast. Start taking on:  06/07/2017   BIOTIN 5000 5 MG Caps Generic drug:  Biotin Take 2 capsules by mouth 2 (two) times daily.   blood glucose meter kit and supplies Dispense Contour next bayer meter. E11.9 To check blood glucose  Once a day.   cyanocobalamin 1000 MCG/ML injection Commonly known as:  (VITAMIN B-12) INJECT 1ML INTO THE MUSCLE ONCE A WEEK FOR 3 WEEKS, THEN MONTHLY THEREAFTER   cyclobenzaprine 10 MG tablet Commonly known as:  FLEXERIL Take 1 tablet (10 mg total) by mouth 2 (two) times daily as needed for muscle spasms.    docusate sodium 100 MG capsule Commonly known as:  COLACE Take 1 capsule (100 mg total) by mouth 2 (two) times daily.   DULoxetine 30 MG capsule Commonly known as:  CYMBALTA TAKE 1 CAPSULE BY MOUTH EVERY DAY   freestyle lancets Use once daily to check blood sugars  E11.9   glucose blood test strip Commonly known as:  FREESTYLE LITE Use once daily  as instructed to test blood sugar E 11.9   ketorolac 10 MG tablet Commonly known as:  TORADOL Take 1 tablet (10 mg total) by mouth every 6 (six) hours as needed.   lamoTRIgine 200 MG tablet Commonly known as:  LAMICTAL TAKE ONE TABLET BY MOUTH TWICE DAILY   losartan 50 MG tablet Commonly known as:  COZAAR TAKE ONE TABLET BY MOUTH EVERY DAY   metFORMIN 850 MG tablet Commonly known as:  GLUCOPHAGE TAKE 1 TABLET BY MOUTH TWICE DAILY WITH A MEAL What changed:  See the new instructions.   mupirocin ointment 2 % Commonly known as:  BACTROBAN Place 1 application into the nose 2 (two) times daily.   omeprazole 40 MG capsule Commonly known as:  PRILOSEC TAKE 1 CAPSULE BY MOUTH EVERY DAY   oxyCODONE 5 MG immediate release tablet Commonly known as:  Oxy IR/ROXICODONE Take 1-2 tablets (5-10 mg total) by mouth every 3 (three) hours as needed for moderate pain.   sulfamethoxazole-trimethoprim 800-160 MG tablet Commonly known as:  BACTRIM DS,SEPTRA DS Take 1 tablet by mouth 2 (two) times daily.   Syringe (Disposable) 1 ML Misc For use with B12 injections   traMADol 50 MG tablet Commonly known as:  ULTRAM TAKE ONE TABLET BY MOUTH EVERY EIGHT HOURS AS NEEDED   traZODone 50 MG tablet Commonly known as:  DESYREL TAKE 1/2 TO 1 TABLET AT BEDTIME AS NEEDED FOR SLEEP   Vitamin D3 1000 units Caps Take 1,000 Units by mouth daily.   vitamin E 1000 UNIT capsule Take 1,000 Units by mouth daily.       Diagnostic Studies: Dg Knee Right Port  Result Date: 06/04/2017 CLINICAL DATA:  Postop right knee radiographs. EXAM: PORTABLE RIGHT  KNEE - 1-2 VIEW COMPARISON:  Right knee MRI, 04/17/2010 FINDINGS: Right knee prosthetic components appear well seated and well aligned. There is no acute fracture or evidence of an operative complication. IMPRESSION: 1. Well-positioned total right knee arthroplasty. Electronically Signed   By: Lajean Manes M.D.   On: 06/04/2017 12:16    Disposition: 01-Home or Self Care       Signed: Lovell Sheehan ,MD 06/06/2017, 1:00 PM

## 2017-06-06 NOTE — Progress Notes (Signed)
Physical Therapy Treatment Patient Details Name: Katherine Jimenez MRN: [id number] DOB: [birth year] Today's Date: 06/06/2017    History of Present Illness Pt underwent R TKR without reported post-op complications. She is POD#0 at time of PT evaluation. PMH includes HTN, anxiety, depression, bipolar disorder, DM, OA, fibromyalgia, and low back pain    PT Comments    Patient reporting increased pain in R knee this AM despite pre-medication (8/10).  Completes all mobility with RW, cga; very slow and guarded with intermittent rest periods due to pain.  Min cuing for gait mechanics and increased active use of R LE with all functional activities; good efforts to integrate, but unable to sustain without constant cuing/attention. Educated in relaxation techniques, but with significant difficulty terminating activation of R knee musculature.    Follow Up Recommendations  Home health PT     Equipment Recommendations       Recommendations for Other Services       Precautions / Restrictions Precautions Precautions: Knee;Fall Restrictions Weight Bearing Restrictions: Yes RLE Weight Bearing: Weight bearing as tolerated    Mobility  Bed Mobility               General bed mobility comments: seated in recliner beginning/end of treatment session  Transfers Overall transfer level: Needs assistance Equipment used: Rolling walker (2 wheeled) Transfers: Sit to/from Stand Sit to Stand: Min guard         General transfer comment: good hand placement with transfer, maintains R LE anterior to BOS; very limited active use of R LE with movement transition  Ambulation/Gait   Ambulation Distance (Feet): 110 Feet(x2)     Gait velocity: 10' walk time, 18 seconds   General Gait Details: step to gait pattern with short, shuffling steps; improved step length and stance time with cuing, but unable to maintain.  Very slow and guarded throughout   Stairs Stairs: Yes   Stair Management: One  rail Left Number of Stairs: 4 General stair comments: step to gait pattern, min cuing for technique.  Good R knee control and stability in modified SLS  Wheelchair Mobility    Modified Rankin (Stroke Patients Only)       Balance Overall balance assessment: Needs assistance Sitting-balance support: No upper extremity supported;Feet supported Sitting balance-Leahy Scale: Good     Standing balance support: Bilateral upper extremity supported;During functional activity Standing balance-Leahy Scale: Fair                              Cognition Arousal/Alertness: Awake/alert Behavior During Therapy: WFL for tasks assessed/performed Overall Cognitive Status: Within Functional Limits for tasks assessed                                        Exercises Total Joint Exercises Goniometric ROM: R knee: 0-72 degrees, limited by pain Other Exercises Other Exercises: Seated ankle pumps and R knee flex/ext for ROM/flexibility.  Constant encouragement for relaxation; patient tends to maintain active contration of R knee musculature wih noted difficulty terminating    General Comments        Pertinent Vitals/Pain Pain Assessment: 0-10 Pain Score: 8  Pain Location: R knee Pain Descriptors / Indicators: Aching;Grimacing;Guarding Pain Intervention(s): Limited activity within patient's tolerance;Monitored during session;Premedicated before session;Repositioned    Home Living  Prior Function            PT Goals (current goals can now be found in the care plan section) Acute Rehab PT Goals Patient Stated Goal: Return to prior function at home PT Goal Formulation: With patient Time For Goal Achievement: 06/18/17 Potential to Achieve Goals: Good Progress towards PT goals: Progressing toward goals    Frequency    BID      PT Plan Current plan remains appropriate    Co-evaluation              AM-PAC PT "6 Clicks"  Daily Activity  Outcome Measure  Difficulty turning over in bed (including adjusting bedclothes, sheets and blankets)?: A Little Difficulty moving from lying on back to sitting on the side of the bed? : A Little Difficulty sitting down on and standing up from a chair with arms (e.g., wheelchair, bedside commode, etc,.)?: A Little Help needed moving to and from a bed to chair (including a wheelchair)?: A Little Help needed walking in hospital room?: A Little Help needed climbing 3-5 steps with a railing? : A Little 6 Click Score: 18    End of Session Equipment Utilized During Treatment: Gait belt Activity Tolerance: Patient tolerated treatment well;Patient limited by pain Patient left: in chair;with call bell/phone within reach;with chair alarm set;Other (comment) Nurse Communication: Mobility status;Patient requests pain meds PT Visit Diagnosis: Muscle weakness (generalized) (M62.81);Other abnormalities of gait and mobility (R26.89);Difficulty in walking, not elsewhere classified (R26.2);Pain Pain - Right/Left: Right Pain - part of body: Knee     Time: [id number] PT Time Calculation (min) (ACUTE ONLY): 53 min  Charges:  $Gait Training: 23-37 mins $Therapeutic Exercise: 8-22 mins $Therapeutic Activity: 8-22 mins                    G Codes:       Vernon Maish H. Owens Shark, PT, DPT, NCS 06/06/17, 8:55 PM [phone]

## 2017-06-06 NOTE — Progress Notes (Signed)
Discharge summary reviewed with verbal understanding. Answered all questions. Instructions given per MD.

## 2017-06-06 NOTE — Progress Notes (Signed)
Physical Therapy Treatment Patient Details Name: Katherine Jimenez MRN: [id number] DOB: 06-Apr-1950 Today's Date: 06/06/2017    History of Present Illness Pt underwent R TKR without reported post-op complications. She is POD#0 at time of PT evaluation. PMH includes HTN, anxiety, depression, bipolar disorder, DM, OA, fibromyalgia, and low back pain    PT Comments    Katherine Jimenez was limited by significant pain in R knee.  She completed therapeutic exercises in sitting and ambulated 100 ft with cues for improved gait mechanics.  R knee ROM -3 to 60 deg this session.  Discussed using relaxation strategies when she is experiencing pain such as: deep breathing, meditation, etc.  Follow up recommendations remain appropriate.    Follow Up Recommendations  Home health PT     Equipment Recommendations  None recommended by PT    Recommendations for Other Services       Precautions / Restrictions Precautions Precautions: Knee;Fall Restrictions Weight Bearing Restrictions: Yes RLE Weight Bearing: Weight bearing as tolerated    Mobility  Bed Mobility               General bed mobility comments: Pt sitting in chair at start and end of session  Transfers Overall transfer level: Needs assistance Equipment used: Rolling walker (2 wheeled) Transfers: Sit to/from Stand Sit to Stand: Min guard         General transfer comment: Pt demonstrates proper technique but does require increased time, rising slowly to stand.  Pt with well controlled descent to sit.   Ambulation/Gait Ambulation/Gait assistance: Min guard Ambulation Distance (Feet): 100 Feet Assistive device: Rolling walker (2 wheeled) Gait Pattern/deviations: Step-to pattern;Step-through pattern;Decreased stance time - right;Decreased weight shift to right;Decreased dorsiflexion - right Gait velocity: Decreased Gait velocity interpretation: Below normal speed for age/gender General Gait Details: Pt initially demonstrates step  to gait pattern but transitions to step through pattern with cues to not shuffle her feet. Cues and demonstration provided for heel strike RLE.  Pt goes back and forth between step to and step through with dec weight shift to RLE due to pain (pt crying while ambulating).    Stairs            Wheelchair Mobility    Modified Rankin (Stroke Patients Only)       Balance Overall balance assessment: Needs assistance Sitting-balance support: No upper extremity supported;Feet supported Sitting balance-Leahy Scale: Good     Standing balance support: Single extremity supported;During functional activity Standing balance-Leahy Scale: Poor Standing balance comment: Pt relies on UE support for static and dynamic activities                            Cognition Arousal/Alertness: Awake/alert Behavior During Therapy: WFL for tasks assessed/performed Overall Cognitive Status: Within Functional Limits for tasks assessed                                        Exercises Total Joint Exercises Ankle Circles/Pumps: AROM;Both;10 reps;Seated Straight Leg Raises: Strengthening;AAROM;Right;5 reps;Seated Knee Flexion: AAROM;Right;5 reps;Seated;Other (comment)(with 5 second holds; significant guarding present) Goniometric ROM: -3 to 60 deg Other Exercises Other Exercises: Lateral weight shifting in standing with BUEs supported x10 each direction    General Comments General comments (skin integrity, edema, etc.): Discussed using relaxation strategies when she is experiencing pain such as: deep breathing, meditation, etc.  Pertinent Vitals/Pain Pain Assessment: Faces Faces Pain Scale: Hurts worst Pain Location: R knee Pain Descriptors / Indicators: Grimacing;Guarding;Crying Pain Intervention(s): Limited activity within patient's tolerance;Monitored during session;Repositioned;Patient requesting pain meds-RN notified;Ice applied;Utilized relaxation techniques     Home Living                      Prior Function            PT Goals (current goals can now be found in the care plan section) Acute Rehab PT Goals Patient Stated Goal: Return to prior function at home PT Goal Formulation: With patient Time For Goal Achievement: 06/18/17 Potential to Achieve Goals: Good Progress towards PT goals: Progressing toward goals(modestly)    Frequency    BID      PT Plan Current plan remains appropriate    Co-evaluation              AM-PAC PT "6 Clicks" Daily Activity  Outcome Measure  Difficulty turning over in bed (including adjusting bedclothes, sheets and blankets)?: A Little Difficulty moving from lying on back to sitting on the side of the bed? : A Little Difficulty sitting down on and standing up from a chair with arms (e.g., wheelchair, bedside commode, etc,.)?: A Little Help needed moving to and from a bed to chair (including a wheelchair)?: A Little Help needed walking in hospital room?: A Little Help needed climbing 3-5 steps with a railing? : A Little 6 Click Score: 18    End of Session Equipment Utilized During Treatment: Gait belt Activity Tolerance: Patient tolerated treatment well;Patient limited by pain Patient left: in chair;with call bell/phone within reach;with chair alarm set;Other (comment)(with polar care and bone foam) Nurse Communication: Mobility status;Patient requests pain meds PT Visit Diagnosis: Muscle weakness (generalized) (M62.81);Other abnormalities of gait and mobility (R26.89);Difficulty in walking, not elsewhere classified (R26.2);Pain Pain - Right/Left: Right Pain - part of body: Knee     Time: 9735-3299 PT Time Calculation (min) (ACUTE ONLY): 23 min  Charges:  $Gait Training: 8-22 mins $Therapeutic Exercise: 8-22 mins                    G Codes:       Katherine Jimenez PT, DPT 06/06/2017, 4:03 PM

## 2017-06-06 NOTE — Progress Notes (Signed)
Dr Harlow Mares notified of pt's request for sleep aide and muscle relaxer. New order received

## 2017-06-06 NOTE — Discharge Instructions (Signed)
Continue weight bear as tolerated on the right lower extremity.   ° °Elevate the right lower extremity whenever possible and continue the polar care while elevating the extremity. Patient may shower. No bath or submerging the wound.   ° °Take ECASA as directed for blood clot prevention. ° °Continue to work on knee range of motion exercises at home as instructed by physical therapy. Continue to use a walker for assistance with ambulation until cleared by physical therapy. ° °Call [phone] with any questions, such as fever > 101.5 degrees, drainage from the wound or shortness of breath. ° ° °

## 2017-06-11 DIAGNOSIS — M25661 Stiffness of right knee, not elsewhere classified: Secondary | ICD-10-CM | POA: Diagnosis not present

## 2017-06-11 DIAGNOSIS — M25561 Pain in right knee: Secondary | ICD-10-CM | POA: Diagnosis not present

## 2017-06-11 DIAGNOSIS — R600 Localized edema: Secondary | ICD-10-CM | POA: Diagnosis not present

## 2017-06-14 DIAGNOSIS — M25561 Pain in right knee: Secondary | ICD-10-CM | POA: Diagnosis not present

## 2017-06-14 DIAGNOSIS — R609 Edema, unspecified: Secondary | ICD-10-CM | POA: Diagnosis not present

## 2017-06-14 DIAGNOSIS — M25661 Stiffness of right knee, not elsewhere classified: Secondary | ICD-10-CM | POA: Diagnosis not present

## 2017-06-18 DIAGNOSIS — M25561 Pain in right knee: Secondary | ICD-10-CM | POA: Diagnosis not present

## 2017-06-18 DIAGNOSIS — M25661 Stiffness of right knee, not elsewhere classified: Secondary | ICD-10-CM | POA: Diagnosis not present

## 2017-06-18 DIAGNOSIS — R609 Edema, unspecified: Secondary | ICD-10-CM | POA: Diagnosis not present

## 2017-06-20 DIAGNOSIS — R600 Localized edema: Secondary | ICD-10-CM | POA: Diagnosis not present

## 2017-06-20 DIAGNOSIS — M25561 Pain in right knee: Secondary | ICD-10-CM | POA: Diagnosis not present

## 2017-06-20 DIAGNOSIS — M25661 Stiffness of right knee, not elsewhere classified: Secondary | ICD-10-CM | POA: Diagnosis not present

## 2017-06-25 DIAGNOSIS — M25661 Stiffness of right knee, not elsewhere classified: Secondary | ICD-10-CM | POA: Diagnosis not present

## 2017-06-25 DIAGNOSIS — R609 Edema, unspecified: Secondary | ICD-10-CM | POA: Diagnosis not present

## 2017-06-25 DIAGNOSIS — M25561 Pain in right knee: Secondary | ICD-10-CM | POA: Diagnosis not present

## 2017-06-28 ENCOUNTER — Telehealth: Payer: Self-pay | Admitting: Internal Medicine

## 2017-06-28 ENCOUNTER — Other Ambulatory Visit: Payer: Self-pay | Admitting: *Deleted

## 2017-06-28 MED ORDER — OMEPRAZOLE 40 MG PO CPDR: DELAYED_RELEASE_CAPSULE | ORAL | 0 refills | Status: DC

## 2017-06-28 NOTE — Telephone Encounter (Signed)
Copied from [place]. Topic: Quick Communication - Rx Refill/Question >> Jun 28, 2017 12:56 PM Cleaster Corin, NT wrote: Medication: omeprazole (PRILOSEC) 40 MG capsule [[id number]] Has the patient contacted their pharmacy? yes (Agent: If no, request that the patient contact the pharmacy for the refill.) Preferred Pharmacy (with phone number or street name): [id number] [place] [address] [place] [address] Phone: [phone] Fax: [phone]   Agent: Please be advised that RX refills may take up to 3 business days. We ask that you follow-up with your pharmacy.

## 2017-06-29 DIAGNOSIS — M25661 Stiffness of right knee, not elsewhere classified: Secondary | ICD-10-CM | POA: Diagnosis not present

## 2017-06-29 DIAGNOSIS — R600 Localized edema: Secondary | ICD-10-CM | POA: Diagnosis not present

## 2017-06-29 DIAGNOSIS — M25561 Pain in right knee: Secondary | ICD-10-CM | POA: Diagnosis not present

## 2017-07-02 ENCOUNTER — Encounter: Payer: Self-pay | Admitting: Orthopedic Surgery

## 2017-07-03 ENCOUNTER — Encounter: Payer: Self-pay | Admitting: Orthopedic Surgery

## 2017-07-03 DIAGNOSIS — M25561 Pain in right knee: Secondary | ICD-10-CM | POA: Diagnosis not present

## 2017-07-03 DIAGNOSIS — R609 Edema, unspecified: Secondary | ICD-10-CM | POA: Diagnosis not present

## 2017-07-03 DIAGNOSIS — M25661 Stiffness of right knee, not elsewhere classified: Secondary | ICD-10-CM | POA: Diagnosis not present

## 2017-07-06 DIAGNOSIS — M25661 Stiffness of right knee, not elsewhere classified: Secondary | ICD-10-CM | POA: Diagnosis not present

## 2017-07-06 DIAGNOSIS — M25561 Pain in right knee: Secondary | ICD-10-CM | POA: Diagnosis not present

## 2017-07-06 DIAGNOSIS — R609 Edema, unspecified: Secondary | ICD-10-CM | POA: Diagnosis not present

## 2017-07-09 ENCOUNTER — Other Ambulatory Visit: Payer: Self-pay | Admitting: Internal Medicine

## 2017-07-09 DIAGNOSIS — R609 Edema, unspecified: Secondary | ICD-10-CM | POA: Diagnosis not present

## 2017-07-09 DIAGNOSIS — Z760 Encounter for issue of repeat prescription: Secondary | ICD-10-CM

## 2017-07-09 DIAGNOSIS — M25561 Pain in right knee: Secondary | ICD-10-CM | POA: Diagnosis not present

## 2017-07-09 DIAGNOSIS — M25661 Stiffness of right knee, not elsewhere classified: Secondary | ICD-10-CM | POA: Diagnosis not present

## 2017-07-10 ENCOUNTER — Other Ambulatory Visit: Payer: Self-pay | Admitting: Internal Medicine

## 2017-07-10 DIAGNOSIS — Z760 Encounter for issue of repeat prescription: Secondary | ICD-10-CM

## 2017-07-10 NOTE — Telephone Encounter (Signed)
Refilled: 04/13/2017 Last OV: 05/16/2017 Next OV: not scheduled

## 2017-07-12 DIAGNOSIS — R609 Edema, unspecified: Secondary | ICD-10-CM | POA: Diagnosis not present

## 2017-07-12 DIAGNOSIS — Z96651 Presence of right artificial knee joint: Secondary | ICD-10-CM | POA: Diagnosis not present

## 2017-07-12 DIAGNOSIS — M25661 Stiffness of right knee, not elsewhere classified: Secondary | ICD-10-CM | POA: Diagnosis not present

## 2017-07-12 DIAGNOSIS — M25561 Pain in right knee: Secondary | ICD-10-CM | POA: Diagnosis not present

## 2017-07-17 DIAGNOSIS — M25661 Stiffness of right knee, not elsewhere classified: Secondary | ICD-10-CM | POA: Diagnosis not present

## 2017-07-17 DIAGNOSIS — M25561 Pain in right knee: Secondary | ICD-10-CM | POA: Diagnosis not present

## 2017-07-17 DIAGNOSIS — R609 Edema, unspecified: Secondary | ICD-10-CM | POA: Diagnosis not present

## 2017-07-19 DIAGNOSIS — M25561 Pain in right knee: Secondary | ICD-10-CM | POA: Diagnosis not present

## 2017-07-19 DIAGNOSIS — M25661 Stiffness of right knee, not elsewhere classified: Secondary | ICD-10-CM | POA: Diagnosis not present

## 2017-07-19 DIAGNOSIS — R609 Edema, unspecified: Secondary | ICD-10-CM | POA: Diagnosis not present

## 2017-07-20 ENCOUNTER — Other Ambulatory Visit: Payer: Self-pay | Admitting: Internal Medicine

## 2017-07-23 NOTE — Telephone Encounter (Signed)
Refilled: 12/07/2016 Last OV: 05/16/2017 Next OV: not scheduled

## 2017-07-24 DIAGNOSIS — M25661 Stiffness of right knee, not elsewhere classified: Secondary | ICD-10-CM | POA: Diagnosis not present

## 2017-07-24 DIAGNOSIS — R600 Localized edema: Secondary | ICD-10-CM | POA: Diagnosis not present

## 2017-07-24 DIAGNOSIS — M25561 Pain in right knee: Secondary | ICD-10-CM | POA: Diagnosis not present

## 2017-07-26 DIAGNOSIS — M25561 Pain in right knee: Secondary | ICD-10-CM | POA: Diagnosis not present

## 2017-07-26 DIAGNOSIS — M25661 Stiffness of right knee, not elsewhere classified: Secondary | ICD-10-CM | POA: Diagnosis not present

## 2017-07-26 DIAGNOSIS — R609 Edema, unspecified: Secondary | ICD-10-CM | POA: Diagnosis not present

## 2017-08-02 DIAGNOSIS — M25661 Stiffness of right knee, not elsewhere classified: Secondary | ICD-10-CM | POA: Diagnosis not present

## 2017-08-02 DIAGNOSIS — R609 Edema, unspecified: Secondary | ICD-10-CM | POA: Diagnosis not present

## 2017-08-02 DIAGNOSIS — M25561 Pain in right knee: Secondary | ICD-10-CM | POA: Diagnosis not present

## 2017-08-08 DIAGNOSIS — M25561 Pain in right knee: Secondary | ICD-10-CM | POA: Diagnosis not present

## 2017-08-08 DIAGNOSIS — R609 Edema, unspecified: Secondary | ICD-10-CM | POA: Diagnosis not present

## 2017-08-08 DIAGNOSIS — M25661 Stiffness of right knee, not elsewhere classified: Secondary | ICD-10-CM | POA: Diagnosis not present

## 2017-08-23 DIAGNOSIS — M25661 Stiffness of right knee, not elsewhere classified: Secondary | ICD-10-CM | POA: Diagnosis not present

## 2017-08-23 DIAGNOSIS — R609 Edema, unspecified: Secondary | ICD-10-CM | POA: Diagnosis not present

## 2017-08-23 DIAGNOSIS — M25561 Pain in right knee: Secondary | ICD-10-CM | POA: Diagnosis not present

## 2017-08-24 ENCOUNTER — Other Ambulatory Visit: Payer: Self-pay | Admitting: Internal Medicine

## 2017-08-29 DIAGNOSIS — M25661 Stiffness of right knee, not elsewhere classified: Secondary | ICD-10-CM | POA: Diagnosis not present

## 2017-08-29 DIAGNOSIS — M25561 Pain in right knee: Secondary | ICD-10-CM | POA: Diagnosis not present

## 2017-08-29 DIAGNOSIS — R609 Edema, unspecified: Secondary | ICD-10-CM | POA: Diagnosis not present

## 2017-09-05 DIAGNOSIS — R609 Edema, unspecified: Secondary | ICD-10-CM | POA: Diagnosis not present

## 2017-09-05 DIAGNOSIS — M25561 Pain in right knee: Secondary | ICD-10-CM | POA: Diagnosis not present

## 2017-09-05 DIAGNOSIS — M25661 Stiffness of right knee, not elsewhere classified: Secondary | ICD-10-CM | POA: Diagnosis not present

## 2017-09-12 DIAGNOSIS — M25561 Pain in right knee: Secondary | ICD-10-CM | POA: Diagnosis not present

## 2017-09-12 DIAGNOSIS — R609 Edema, unspecified: Secondary | ICD-10-CM | POA: Diagnosis not present

## 2017-09-12 DIAGNOSIS — M25661 Stiffness of right knee, not elsewhere classified: Secondary | ICD-10-CM | POA: Diagnosis not present

## 2017-09-25 DIAGNOSIS — Z961 Presence of intraocular lens: Secondary | ICD-10-CM | POA: Diagnosis not present

## 2017-09-25 DIAGNOSIS — R600 Localized edema: Secondary | ICD-10-CM | POA: Diagnosis not present

## 2017-09-25 DIAGNOSIS — Z96659 Presence of unspecified artificial knee joint: Secondary | ICD-10-CM | POA: Diagnosis not present

## 2017-09-25 DIAGNOSIS — M25661 Stiffness of right knee, not elsewhere classified: Secondary | ICD-10-CM | POA: Diagnosis not present

## 2017-09-25 DIAGNOSIS — M25561 Pain in right knee: Secondary | ICD-10-CM | POA: Diagnosis not present

## 2017-11-09 ENCOUNTER — Telehealth: Payer: Self-pay | Admitting: Internal Medicine

## 2017-11-09 MED ORDER — METFORMIN HCL 850 MG PO TABS: ORAL_TABLET | ORAL | 0 refills | Status: DC

## 2017-11-09 NOTE — Telephone Encounter (Signed)
Copied from [place] [id number]. Topic: Quick Communication - Rx Refill/Question >> Nov 09, 2017 11:46 AM Synthia Innocent wrote: Medication: metFORMIN (GLUCOPHAGE) 850 MG tablet  Has the patient contacted their pharmacy? Yes.   (Agent: If no, request that the patient contact the pharmacy for the refill.) (Agent: If yes, when and what did the pharmacy advise?)  [facility]erred [facility] phone number [facility] [address]   Agent: Please be advised that RX refills may take up to 3 business days. We ask that you follow-up with your pharmacy.

## 2017-11-15 ENCOUNTER — Other Ambulatory Visit: Payer: Self-pay | Admitting: Internal Medicine

## 2017-11-15 NOTE — Telephone Encounter (Signed)
Refilled: 07/25/2017 Last OV: 05/16/2017 Next OV: not scheduled

## 2017-11-21 DIAGNOSIS — R69 Illness, unspecified: Secondary | ICD-10-CM | POA: Diagnosis not present

## 2017-11-24 DIAGNOSIS — R69 Illness, unspecified: Secondary | ICD-10-CM | POA: Diagnosis not present

## 2017-12-26 DIAGNOSIS — N3946 Mixed incontinence: Secondary | ICD-10-CM | POA: Diagnosis not present

## 2017-12-26 DIAGNOSIS — N3281 Overactive bladder: Secondary | ICD-10-CM | POA: Diagnosis not present

## 2017-12-26 DIAGNOSIS — N952 Postmenopausal atrophic vaginitis: Secondary | ICD-10-CM | POA: Diagnosis not present

## 2018-01-02 ENCOUNTER — Other Ambulatory Visit: Payer: Self-pay | Admitting: Internal Medicine

## 2018-01-02 DIAGNOSIS — Z760 Encounter for issue of repeat prescription: Secondary | ICD-10-CM

## 2018-01-02 NOTE — Telephone Encounter (Signed)
Requested medication (s) are due for refill today: yes  Requested medication (s) are on the active medication list: yes  Last refill:  05/04/17  Future visit scheduled: no  Notes to clinic:  No upcoming visits noted; last office visit 05/04/17    Requested Prescriptions  Pending Prescriptions Disp Refills   ALPRAZolam (XANAX) 0.5 MG tablet 30 tablet 5    Sig: TAKE 1 TABLET (0.5 MG) BY MOUTH AT [place]     Not Delegated - Psychiatry:  Anxiolytics/Hypnotics Failed - 01/02/2018  9:28 AM      Failed - This refill cannot be delegated      Failed - Urine Drug Screen completed in last 360 days.      Failed - Valid encounter within last 6 months    Recent Outpatient Visits          7 months ago Pre-operative examination   [place] Crecencio Mc, MD   8 months ago Essential hypertension   [facility] [place] Crecencio Mc, MD   11 months ago Pure hypercholesterolemia   [facility]  Crecencio Mc, MD   1 year ago Diabetes mellitus without complication [facility])   [place] Crecencio Mc, MD   1 year ago Degenerative lumbar spinal stenosis   [facility]  Crecencio Mc, MD

## 2018-01-02 NOTE — Addendum Note (Signed)
Addended by: Valli Glance F on: 01/02/2018 09:40 AM   Modules accepted: Orders

## 2018-01-02 NOTE — Telephone Encounter (Signed)
Copied from [place] [id number]. Topic: General - Other >> Jan 02, 2018  9:20 AM Janace Aris A wrote: Medication: ALPRAZolam Duanne Moron) 0.5 MG tablet  Has the patient contacted their pharmacy? Yes  Preferred Pharmacy (with phone number or street name[id number] [address] [place] [phone](Phone) [phone](Fax)    Agent: Please be advised that RX refills may take up to 3 business days. We ask that you follow-up with your pharmacy.

## 2018-01-02 NOTE — Telephone Encounter (Signed)
rx request 

## 2018-01-04 MED ORDER — ALPRAZOLAM 0.5 MG PO TABS: ORAL_TABLET | ORAL | 0 refills | Status: DC

## 2018-01-04 NOTE — Telephone Encounter (Signed)
Refilled: 07/10/2017 Last OV: 05/16/2017 Next OV: not scheduled

## 2018-01-17 ENCOUNTER — Other Ambulatory Visit: Payer: Self-pay | Admitting: Internal Medicine

## 2018-01-17 DIAGNOSIS — Z1231 Encounter for screening mammogram for malignant neoplasm of breast: Secondary | ICD-10-CM

## 2018-01-24 ENCOUNTER — Other Ambulatory Visit: Payer: Self-pay | Admitting: Internal Medicine

## 2018-01-24 DIAGNOSIS — Z760 Encounter for issue of repeat prescription: Secondary | ICD-10-CM

## 2018-01-25 ENCOUNTER — Other Ambulatory Visit: Payer: Self-pay | Admitting: Internal Medicine

## 2018-01-28 ENCOUNTER — Other Ambulatory Visit: Payer: Self-pay | Admitting: Internal Medicine

## 2018-01-28 NOTE — Telephone Encounter (Signed)
Have attempted to call pt to schedule a follow up appt. LMTCB to schedule an appt.   Refilled: 01/04/2018 Last OV: 05/16/2017 Next OV: not scheduled

## 2018-01-28 NOTE — Telephone Encounter (Signed)
Pt states she is doing fine and made an appt for 04/12/2018. Pt states if she can get a 90 day of her meds that would be gooe, and she does not need the trazodone.

## 2018-02-07 ENCOUNTER — Ambulatory Visit
Admission: RE | Admit: 2018-02-07 | Discharge: 2018-02-07 | Disposition: A | Discharge status: Home / Self Care | Payer: Medicare HMO | Source: Ambulatory Visit | Attending: Internal Medicine | Admitting: Internal Medicine

## 2018-02-07 DIAGNOSIS — Z1231 Encounter for screening mammogram for malignant neoplasm of breast: Secondary | ICD-10-CM

## 2018-03-04 ENCOUNTER — Other Ambulatory Visit: Payer: Self-pay | Admitting: Internal Medicine

## 2018-04-11 ENCOUNTER — Other Ambulatory Visit: Payer: Self-pay | Admitting: Internal Medicine

## 2018-04-12 ENCOUNTER — Ambulatory Visit: Payer: Self-pay | Admitting: Internal Medicine

## 2018-04-24 ENCOUNTER — Other Ambulatory Visit: Payer: Self-pay | Admitting: Internal Medicine

## 2018-04-24 DIAGNOSIS — Z760 Encounter for issue of repeat prescription: Secondary | ICD-10-CM

## 2018-04-24 MED ORDER — OMEPRAZOLE 40 MG PO CPDR: 40.0000 mg | DELAYED_RELEASE_CAPSULE | Freq: Every day | ORAL | 0 refills | Status: DC

## 2018-04-24 NOTE — Telephone Encounter (Signed)
Requested medication (s) are due for refill today -yes  Requested medication (s) are on the active medication list -yes  Future visit scheduled -yes  Last refill: 01/28/18  Notes to clinic: Patient is requesting non-delegated Rx. Sent for PCP review of request.  Requested Prescriptions  Pending Prescriptions Disp Refills   ALPRAZolam (XANAX) 0.5 MG tablet 30 tablet 2    Sig: TAKE 1 TABLET (0.5 MG) BY MOUTH AT BEDTIME.....Marland KitchenNOV     Not Delegated - Psychiatry:  Anxiolytics/Hypnotics Failed - 04/24/2018 12:04 PM      Failed - This refill cannot be delegated      Failed - Urine Drug Screen completed in last 360 days.      Failed - Valid encounter within last 6 months    Recent Outpatient Visits          11 months ago Pre-operative examination   [place] Crecencio Mc, MD   11 months ago Essential hypertension   [place] Crecencio Mc, MD   1 year ago Pure hypercholesterolemia   [place] Crecencio Mc, MD   1 year ago Diabetes mellitus without complication [facility])   [place] Crecencio Mc, MD   2 years ago Degenerative lumbar spinal stenosis   [facility] [place] Crecencio Mc, MD      Future Appointments            In 1 week Crecencio Mc, MD [place], [place]         Signed Prescriptions Disp Refills   omeprazole (PRILOSEC) 40 MG capsule 30 capsule 0    Sig: Take 1 capsule (40 mg total) by mouth daily.     Gastroenterology: Proton Pump Inhibitors Passed - 04/24/2018 12:04 PM      Passed - Valid encounter within last 12 months    Recent Outpatient Visits          11 months ago Pre-operative examination   [place] Crecencio Mc, MD   11 months ago Essential hypertension   [place] Crecencio Mc, MD   1 year ago Pure hypercholesterolemia   [place] Crecencio Mc,  MD   1 year ago Diabetes mellitus without complication [facility])   [place] Crecencio Mc, MD   2 years ago Degenerative lumbar spinal stenosis   [place] [facility] [place] Crecencio Mc, MD      Future Appointments            In 1 week Derrel Nip Aris Everts, MD [place], [facility]            Requested Prescriptions  Pending Prescriptions Disp Refills   ALPRAZolam (XANAX) 0.5 MG tablet 30 tablet 2    Sig: TAKE 1 TABLET (0.5 MG) BY MOUTH AT BEDTIME.....Marland KitchenNOV     Not Delegated - Psychiatry:  Anxiolytics/Hypnotics Failed - 04/24/2018 12:04 PM      Failed - This refill cannot be delegated      Failed - Urine Drug Screen completed in last 360 days.      Failed - Valid encounter within last 6 months    Recent Outpatient Visits          11 months ago Pre-operative examination   [place] Crecencio Mc, MD   11 months ago Essential hypertension   [place], [place],  MD   1 year ago Pure hypercholesterolemia   [place] [facility] [place] Crecencio Mc, MD   1 year ago Diabetes mellitus without complication [facility])   [place] [facility] [place] Crecencio Mc, MD   2 years ago Degenerative lumbar spinal stenosis   [facility] [place] Crecencio Mc, MD      Future Appointments            In 1 week Crecencio Mc, MD [place], [place]         Signed Prescriptions Disp Refills   omeprazole (PRILOSEC) 40 MG capsule 30 capsule 0    Sig: Take 1 capsule (40 mg total) by mouth daily.     Gastroenterology: Proton Pump Inhibitors Passed - 04/24/2018 12:04 PM      Passed - Valid encounter within last 12 months    Recent Outpatient Visits          11 months ago Pre-operative examination   [place] Crecencio Mc, MD   11 months ago Essential hypertension   [facility] [place] Crecencio Mc, MD   1 year ago  Pure hypercholesterolemia   [place] Crecencio Mc, MD   1 year ago Diabetes mellitus without complication [facility])   [place] Crecencio Mc, MD   2 years ago Degenerative lumbar spinal stenosis   [place] [facility] [place] Crecencio Mc, MD      Future Appointments            In 1 week Derrel Nip, Aris Everts, MD [facility], [facility]

## 2018-04-24 NOTE — Telephone Encounter (Signed)
Copied from [place] [id number]. Topic: Quick Communication - See Telephone Encounter >> Apr 24, 2018 11:57 AM Conception Chancy, NT wrote: CRM for notification. See Telephone encounter for: 04/24/18.  Patient is calling and has a appointment on 05/06/18 and states she is going to be short on omeprazole (PRILOSEC) 40 MG capsule and her Xanax and is needing medicine to get her to her appointment.  [id number] [place] [address] [place] [address] Phone: [phone] Fax: [phone]

## 2018-04-26 MED ORDER — ALPRAZOLAM 0.5 MG PO TABS: ORAL_TABLET | ORAL | 0 refills | Status: DC

## 2018-04-26 NOTE — Telephone Encounter (Signed)
Refilled: 01/28/2018 Last OV: 05/16/2017 Next OV: 05/06/2018

## 2018-05-05 ENCOUNTER — Other Ambulatory Visit: Payer: Self-pay | Admitting: Internal Medicine

## 2018-05-06 ENCOUNTER — Ambulatory Visit: Payer: Self-pay | Admitting: Internal Medicine

## 2018-05-06 NOTE — Telephone Encounter (Signed)
Refilled:03/04/2018 Last OV: 05/16/2017, no showed appt 05/06/2018 Next OV: not scheduled

## 2018-05-08 ENCOUNTER — Other Ambulatory Visit: Payer: Self-pay | Admitting: Internal Medicine

## 2018-05-14 DIAGNOSIS — Z96651 Presence of right artificial knee joint: Secondary | ICD-10-CM | POA: Diagnosis not present

## 2018-05-14 DIAGNOSIS — M533 Sacrococcygeal disorders, not elsewhere classified: Secondary | ICD-10-CM | POA: Diagnosis not present

## 2018-05-14 DIAGNOSIS — M1712 Unilateral primary osteoarthritis, left knee: Secondary | ICD-10-CM | POA: Diagnosis not present

## 2018-05-16 DIAGNOSIS — E119 Type 2 diabetes mellitus without complications: Secondary | ICD-10-CM | POA: Diagnosis not present

## 2018-05-16 LAB — HM DIABETES EYE EXAM

## 2018-05-27 ENCOUNTER — Other Ambulatory Visit: Payer: Self-pay | Admitting: Internal Medicine

## 2018-05-27 DIAGNOSIS — Z760 Encounter for issue of repeat prescription: Secondary | ICD-10-CM

## 2018-05-29 ENCOUNTER — Other Ambulatory Visit: Payer: Self-pay | Admitting: Internal Medicine

## 2018-05-29 DIAGNOSIS — M533 Sacrococcygeal disorders, not elsewhere classified: Secondary | ICD-10-CM | POA: Diagnosis not present

## 2018-05-29 MED ORDER — METFORMIN HCL 850 MG PO TABS: ORAL_TABLET | ORAL | 0 refills | Status: DC

## 2018-05-29 NOTE — Telephone Encounter (Signed)
Copied from [place] [phone]. Topic: Quick Communication - Rx Refill/Question >> May 29, 2018  2:25 PM Burchel, Abbi R wrote: Medication: metFORMIN (GLUCOPHAGE) 850 MG tablet   [id number] [place] [address] [place] [address] Phone: [phone] Fax: [phone] Not a 24 hour pharmacy; exact hours not known.    Pt was  advised that RX refills may take up to 3 business days. We ask that you follow-up with your pharmacy.

## 2018-06-10 DIAGNOSIS — M65312 Trigger thumb, left thumb: Secondary | ICD-10-CM | POA: Diagnosis not present

## 2018-06-12 DIAGNOSIS — R69 Illness, unspecified: Secondary | ICD-10-CM | POA: Diagnosis not present

## 2018-06-17 ENCOUNTER — Other Ambulatory Visit: Payer: Self-pay | Admitting: Internal Medicine

## 2018-06-17 DIAGNOSIS — Z760 Encounter for issue of repeat prescription: Secondary | ICD-10-CM

## 2018-06-18 ENCOUNTER — Other Ambulatory Visit: Payer: Self-pay | Admitting: Internal Medicine

## 2018-06-18 NOTE — Telephone Encounter (Signed)
Refills denied,  It has been over one year since she has been seen.  You can refill or 30 days once she has scheduled an appt

## 2018-06-20 ENCOUNTER — Telehealth: Payer: Self-pay | Admitting: Internal Medicine

## 2018-06-20 DIAGNOSIS — Z760 Encounter for issue of repeat prescription: Secondary | ICD-10-CM

## 2018-06-20 MED ORDER — OMEPRAZOLE 40 MG PO CPDR: 40.0000 mg | DELAYED_RELEASE_CAPSULE | Freq: Every day | ORAL | 0 refills | Status: DC

## 2018-06-20 MED ORDER — ALPRAZOLAM 0.5 MG PO TABS: ORAL_TABLET | ORAL | 0 refills | Status: DC

## 2018-06-20 NOTE — Telephone Encounter (Signed)
Refilled for 30 days only ?

## 2018-06-20 NOTE — Telephone Encounter (Signed)
Copied from [place] [phone]. Topic: Quick Communication - Rx Refill/Question >> Jun 20, 2018 11:02 AM Percell Belt A wrote: Medication:  ALPRAZolam Duanne Moron) 0.5 MG tablet [[id number]]  omeprazole (PRILOSEC) 40 MG capsule [[id number]] ALPRAZolam (XANAX) 0.5 MG tablet [[id number]] Pt has not been in in a year, pt sister has stage 4 cancer and concerned about going into office to bring something back to her.  Pt [facility] to [facility] and is always with her.  She is aware she needs to come in to get refills but is just concerned ?  Please advise   Has the patient contacted their pharmacy? No. (Agent: If no, request that the patient contact the pharmacy for the refill.) (Agent: If yes, when and what did the pharmacy advise?) [id number], [place] (Phone)  Preferred Pharmacy (with phone number or street name):   Agent: Please be advised that RX refills may take up to 3 business days. We ask that you follow-up with your pharmacy.

## 2018-06-20 NOTE — Telephone Encounter (Signed)
Pt has scheduled an appt in April. I have sent in a 30 day supply of omeprazole pt is requesting a refill of Alprazolam.

## 2018-06-21 ENCOUNTER — Other Ambulatory Visit: Payer: Self-pay | Admitting: Internal Medicine

## 2018-06-21 ENCOUNTER — Ambulatory Visit: Payer: Self-pay | Admitting: Internal Medicine

## 2018-06-21 DIAGNOSIS — N907 Vulvar cyst: Secondary | ICD-10-CM | POA: Diagnosis not present

## 2018-06-21 NOTE — Telephone Encounter (Signed)
Last OV 05/16/2017   Last refilled 05/06/2018  Next appt 07/05/2018  Sent to PCP for approval

## 2018-07-05 ENCOUNTER — Ambulatory Visit (INDEPENDENT_AMBULATORY_CARE_PROVIDER_SITE_OTHER): Payer: Medicare HMO | Admitting: Internal Medicine

## 2018-07-05 DIAGNOSIS — M48061 Spinal stenosis, lumbar region without neurogenic claudication: Secondary | ICD-10-CM

## 2018-07-05 DIAGNOSIS — E119 Type 2 diabetes mellitus without complications: Secondary | ICD-10-CM

## 2018-07-05 DIAGNOSIS — D508 Other iron deficiency anemias: Secondary | ICD-10-CM

## 2018-07-05 DIAGNOSIS — F418 Other specified anxiety disorders: Secondary | ICD-10-CM

## 2018-07-05 DIAGNOSIS — E782 Mixed hyperlipidemia: Secondary | ICD-10-CM

## 2018-07-05 DIAGNOSIS — R69 Illness, unspecified: Secondary | ICD-10-CM | POA: Diagnosis not present

## 2018-07-05 DIAGNOSIS — Z760 Encounter for issue of repeat prescription: Secondary | ICD-10-CM

## 2018-07-05 DIAGNOSIS — F5105 Insomnia due to other mental disorder: Secondary | ICD-10-CM

## 2018-07-05 DIAGNOSIS — R5383 Other fatigue: Secondary | ICD-10-CM

## 2018-07-05 DIAGNOSIS — I10 Essential (primary) hypertension: Secondary | ICD-10-CM

## 2018-07-05 DIAGNOSIS — F3131 Bipolar disorder, current episode depressed, mild: Secondary | ICD-10-CM

## 2018-07-05 MED ORDER — ALPRAZOLAM 0.5 MG PO TABS: ORAL_TABLET | ORAL | 5 refills | Status: DC

## 2018-07-05 NOTE — Progress Notes (Signed)
Virtual Visit via Telephone Note  I connected with Katherine Jimenez on 07/07/18 at 10:30 AM EDT by telephone and verified that I am speaking with the correct person using two identifiers.   I discussed the limitations, risks, security and privacy concerns of performing an evaluation and management service by telephone and the availability of in person appointments. I also discussed with the patient that there may be a patient responsible charge related to this service. The patient expressed understanding and agreed to proceed.   History of Present Illness:  Follow up on type 2 DM, hypertension and GAD. Last seen Feb 2019 .  Stressors incude sister Nancy's diagnosis of Lung CA stage 4 in December  Total knee replacement in march 2019  Managing back pain with cymbalta , Tylenol and tramadol  Patient has not been see in a year for hypertension and Diabetes follow up and her last assessment of renal function was a year ago. Patient is taking her medications as prescribed and notes no adverse effects.  Home BP readings have been done about once per week and are  generally < 140/80 .  She is avoiding added salt in her diet and walking regularly about 3 times per week for exercise  Patient does not check blood sugars more than once a week.,  Last one was 135 a month ago in a fasting state.  Does not recall any above 200 or  less than 80.  No complaints today. voicices awareness  of the foods he/she needs to avoid,  And follows a low GI diet about 50% of the time.  Has had an annual diabetic eye exam.  Denies numbness and tingling in lower extremities.  Denies hypoglycemic symptoms.      Observations/Objective:  General:  Calm ,  Affect normal .  Not sort of breath.  Voices concr about the effect of the pandemic on her ability to care for her sister Izora Gala  Lungs:  No obvious sounds of dyspnea or respiratory illness,  Not coughing.   Assessment and Plan:  Degenerative lumbar spinal stenosis Attempts  to control and relieve her pain have failed conservative therapy including ESI's and  spinal cord stimulator.  She continues to have daily disabling pain and prefers to  avoid narcotics , but has resorted to using tramadol at night only.  She  has resumed cymbalta with mild improvement in symptoms.  Refills given . Continue tylenol and tramadol at night only. 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  Reviewed list of meds, patient is  taking OTC naproxen that could be causing,. It.  Have asked patient to recheck bp at home a minimum of 5 times over the next 4 weeks and call readings to office for adjustment of medications.    / Lab Results  Component Value Date   CREATININE 0.71 06/05/2017   Lab Results  Component Value Date   NA 136 06/05/2017   K 4.0 06/05/2017   CL 102 06/05/2017   CO2 25 06/05/2017     Hyperlipidemia Based on 2018 lipid profile, the risk of clinically significant CAD is 25% over the next 10 years, using the Framingham risk calculator. Despite  a diagnostic cath in 2014 that was negative for significant disease.  She has deferred asa and statin therapy  Lab Results  Component Value Date   CHOL 255 (H) 01/17/2017   HDL 109.70 01/17/2017   LDLCALC 128 (H) 01/17/2017   LDLDIRECT 112.0  10/13/2016   TRIG 87.0 01/17/2017   CHOLHDL 2 01/17/2017         Diabetes mellitus without complication (HCC) Historically Excellent control with metformin at dose of  850 mg bid.  Advised to continue low glycemic index diet and return for fasting labs  .  She is unable to resume resume regular exercise due to back pain and DJD right knee.  No proteinuria  Lab Results  Component Value Date   HGBA1C 6.4 05/04/2017    Lab Results  Component Value Date   MICROALBUR <0.7 10/13/2016         Insomnia secondary to depression with anxiety Managed with nightly  alprazolam  Did not tolerate  trazodone,  No changes today .   Alprazolam refilled  Bipolar disorder Managed with lamictal prescribed initially by her psychiatrist.  She is overdue for surveillance labs    Updated Medication List Outpatient Encounter Medications as of 07/05/2018  Medication Sig  . acetaminophen (TYLENOL) 500 MG tablet Take 1,000 mg by mouth every 6 (six) hours as needed (FOR PAIN.).  Derrill Memo ON 07/20/2018] ALPRAZolam (XANAX) 0.5 MG tablet TAKE 1 TABLET BY MOUTH AT BEDTIME as needed for insomnia  . Biotin (BIOTIN 5000) 5 MG CAPS Take 2 capsules by mouth 2 (two) times daily.   . blood glucose meter kit and supplies Dispense Contour next bayer meter. E11.9 To check blood glucose  Once a day.  . Cholecalciferol (VITAMIN D3) 1000 units CAPS Take 1,000 Units by mouth daily.   . cyanocobalamin (,VITAMIN B-12,) 1000 MCG/ML injection INJECT 1ML INTO THE MUSCLE ONCE A WEEK FOR 3 WEEKS, THEN MONTHLY THEREAFTER  . DULoxetine (CYMBALTA) 30 MG capsule TAKE 1 CAPSULE BY MOUTH EVERY DAY  . glucose blood (FREESTYLE LITE) test strip Use once daily  as instructed to test blood sugar E 11.9  . lamoTRIgine (LAMICTAL) 200 MG tablet TAKE 1 TABLET BY MOUTH TWICE A DAY  . Lancets (FREESTYLE) lancets Use once daily to check blood sugars  E11.9  . losartan (COZAAR) 50 MG tablet TAKE 1 TABLET BY MOUTH EVERY DAY  . metFORMIN (GLUCOPHAGE) 850 MG tablet TAKE 1 TABLET BY MOUTH TWICE DAILY WITH A MEAL  . omeprazole (PRILOSEC) 40 MG capsule Take 1 capsule (40 mg total) by mouth daily.  . Syringe, Disposable, 1 ML MISC For use with B12 injections  . traZODone (DESYREL) 50 MG tablet TAKE 1/2-1 TAB BY MOUTH AT BEDTIME  . vitamin E 1000 UNIT capsule Take 1,000 Units by mouth daily.  . [DISCONTINUED] ALPRAZolam (XANAX) 0.5 MG tablet TAKE 1 TABLET BY MOUTH AT BEDTIME as needed for insomnia  . [DISCONTINUED] aspirin EC 325 MG EC tablet Take 1 tablet (325 mg total) by mouth daily with breakfast. (Patient not taking: Reported on 07/05/2018)  . [DISCONTINUED] cyclobenzaprine  (FLEXERIL) 10 MG tablet Take 1 tablet (10 mg total) by mouth 2 (two) times daily as needed for muscle spasms. (Patient not taking: Reported on 07/05/2018)  . [DISCONTINUED] docusate sodium (COLACE) 100 MG capsule Take 1 capsule (100 mg total) by mouth 2 (two) times daily. (Patient not taking: Reported on 07/05/2018)  . [DISCONTINUED] ketorolac (TORADOL) 10 MG tablet Take 1 tablet (10 mg total) by mouth every 6 (six) hours as needed. (Patient not taking: Reported on 07/05/2018)  . [DISCONTINUED] mupirocin ointment (BACTROBAN) 2 % Place 1 application into the nose 2 (two) times daily. (Patient not taking: Reported on 07/05/2018)  . [DISCONTINUED] oxyCODONE (OXY IR/ROXICODONE) 5 MG immediate release tablet Take 1-2  tablets (5-10 mg total) by mouth every 3 (three) hours as needed for moderate pain. (Patient not taking: Reported on 07/05/2018)  . [DISCONTINUED] sulfamethoxazole-trimethoprim (BACTRIM DS,SEPTRA DS) 800-160 MG tablet Take 1 tablet by mouth 2 (two) times daily. (Patient not taking: Reported on 06/04/2017)  . [DISCONTINUED] traMADol (ULTRAM) 50 MG tablet TAKE ONE TABLET BY MOUTH EVERY EIGHT HOURS AS NEEDED (Patient not taking: Reported on 05/16/2017)   No facility-administered encounter medications on file as of 07/05/2018.    Follow Up Instructions:  Return for fasting labs ASAP.    I discussed the assessment and treatment plan with the patient. The patient was provided an opportunity to ask questions and all were answered. The patient agreed with the plan and demonstrated an understanding of the instructions.   The patient was advised to call back or seek an in-person evaluation if the symptoms worsen or if the condition fails to improve as anticipated.  I provided 25 minutes of non-face-to-face time during this encounter.   Crecencio Mc, MD

## 2018-07-07 NOTE — Assessment & Plan Note (Signed)
Attempts to control and relieve her pain have failed conservative therapy including ESI's and  spinal cord stimulator.  She continues to have daily disabling pain and prefers to  avoid narcotics , but has resorted to using tramadol at night only.  She  has resumed cymbalta with mild improvement in symptoms.  Refills given . Continue tylenol and tramadol at night only. 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

## 2018-07-07 NOTE — Assessment & Plan Note (Signed)
Managed with nightly  alprazolam  Did not tolerate  trazodone,  No changes today .  Alprazolam refilled

## 2018-07-07 NOTE — Assessment & Plan Note (Signed)
Historically Excellent control with metformin at dose of  850 mg bid.  Advised to continue low glycemic index diet and return for fasting labs  .  She is unable to resume resume regular exercise due to back pain and DJD right knee.  No proteinuria  Lab Results  Component Value Date   HGBA1C 6.4 05/04/2017    Lab Results  Component Value Date   MICROALBUR <0.7 10/13/2016

## 2018-07-07 NOTE — Assessment & Plan Note (Signed)
Based on 2018 lipid profile, the risk of clinically significant CAD is 25% over the next 10 years, using the Framingham risk calculator. Despite  a diagnostic cath in 2014 that was negative for significant disease.  She has deferred asa and statin therapy  Lab Results  Component Value Date   CHOL 255 (H) 01/17/2017   HDL 109.70 01/17/2017   LDLCALC 128 (H) 01/17/2017   LDLDIRECT 112.0 10/13/2016   TRIG 87.0 01/17/2017   CHOLHDL 2 01/17/2017

## 2018-07-07 NOTE — Assessment & Plan Note (Signed)
Elevated today.  Reviewed list of meds, patient is  taking OTC naproxen that could be causing,. It.  Have asked patient to recheck bp at home a minimum of 5 times over the next 4 weeks and call readings to office for adjustment of medications.    / Lab Results  Component Value Date   CREATININE 0.71 06/05/2017   Lab Results  Component Value Date   NA 136 06/05/2017   K 4.0 06/05/2017   CL 102 06/05/2017   CO2 25 06/05/2017

## 2018-07-07 NOTE — Assessment & Plan Note (Signed)
Managed with lamictal prescribed initially by her psychiatrist.  She is overdue for surveillance labs

## 2018-07-08 ENCOUNTER — Other Ambulatory Visit (INDEPENDENT_AMBULATORY_CARE_PROVIDER_SITE_OTHER): Payer: Medicare HMO

## 2018-07-08 ENCOUNTER — Other Ambulatory Visit: Payer: Self-pay

## 2018-07-08 DIAGNOSIS — R5383 Other fatigue: Secondary | ICD-10-CM

## 2018-07-08 DIAGNOSIS — E119 Type 2 diabetes mellitus without complications: Secondary | ICD-10-CM | POA: Diagnosis not present

## 2018-07-08 DIAGNOSIS — E782 Mixed hyperlipidemia: Secondary | ICD-10-CM

## 2018-07-08 DIAGNOSIS — D508 Other iron deficiency anemias: Secondary | ICD-10-CM | POA: Diagnosis not present

## 2018-07-08 LAB — LIPID PANEL
Cholesterol: 284 mg/dL — ABNORMAL HIGH (ref 0–200)
HDL: 106.4 mg/dL (ref >39.00)
LDL Cholesterol: 152 mg/dL — ABNORMAL HIGH (ref 0–99)
NonHDL: 177.62
Total CHOL/HDL Ratio: 3
Triglycerides: 126 mg/dL (ref 0.0–149.0)
VLDL: 25.2 mg/dL (ref 0.0–40.0)

## 2018-07-08 LAB — CBC WITH DIFFERENTIAL/PLATELET
Basophils Absolute: 0 10*3/uL (ref 0.0–0.1)
Basophils Relative: 0.7 % (ref 0.0–3.0)
Eosinophils Absolute: 0.1 10*3/uL (ref 0.0–0.7)
Eosinophils Relative: 1.5 % (ref 0.0–5.0)
HCT: 39.8 % (ref 36.0–46.0)
Hemoglobin: 13 g/dL (ref 12.0–15.0)
Lymphocytes Relative: 50.5 % — ABNORMAL HIGH (ref 12.0–46.0)
Lymphs Abs: 2.7 10*3/uL (ref 0.7–4.0)
MCHC: 32.8 g/dL (ref 30.0–36.0)
MCV: 89.4 fl (ref 78.0–100.0)
Monocytes Absolute: 0.3 10*3/uL (ref 0.1–1.0)
Monocytes Relative: 6 % (ref 3.0–12.0)
Neutro Abs: 2.2 10*3/uL (ref 1.4–7.7)
Neutrophils Relative %: 41.3 % — ABNORMAL LOW (ref 43.0–77.0)
Platelets: 323 10*3/uL (ref 150.0–400.0)
RBC: 4.45 Mil/uL (ref 3.87–5.11)
RDW: 15.9 % — ABNORMAL HIGH (ref 11.5–15.5)
WBC: 5.3 10*3/uL (ref 4.0–10.5)

## 2018-07-08 LAB — COMPREHENSIVE METABOLIC PANEL
ALT: 12 U/L (ref 0–35)
AST: 11 U/L (ref 0–37)
Albumin: 4.4 g/dL (ref 3.5–5.2)
Alkaline Phosphatase: 102 U/L (ref 39–117)
BUN: 13 mg/dL (ref 6–23)
CO2: 30 mEq/L (ref 19–32)
Calcium: 9.8 mg/dL (ref 8.4–10.5)
Chloride: 99 mEq/L (ref 96–112)
Creatinine, Ser: 0.82 mg/dL (ref 0.40–1.20)
GFR: 69.66 mL/min (ref >60.00)
Glucose, Bld: 125 mg/dL — ABNORMAL HIGH (ref 70–99)
Potassium: 4.1 mEq/L (ref 3.5–5.1)
Sodium: 138 mEq/L (ref 135–145)
Total Bilirubin: 0.4 mg/dL (ref 0.2–1.2)
Total Protein: 6.8 g/dL (ref 6.0–8.3)

## 2018-07-08 LAB — TSH: TSH: 2.05 u[IU]/mL (ref 0.35–4.50)

## 2018-07-08 LAB — MICROALBUMIN / CREATININE URINE RATIO
Creatinine,U: 104.3 mg/dL
Microalb Creat Ratio: 0.7 mg/g (ref 0.0–30.0)
Microalb, Ur: <0.7 mg/dL (ref 0.0–1.9)

## 2018-07-08 LAB — HEMOGLOBIN A1C: Hgb A1c MFr Bld: 6.6 % — ABNORMAL HIGH (ref 4.6–6.5)

## 2018-07-16 ENCOUNTER — Other Ambulatory Visit: Payer: Self-pay | Admitting: Internal Medicine

## 2018-07-29 DIAGNOSIS — M533 Sacrococcygeal disorders, not elsewhere classified: Secondary | ICD-10-CM | POA: Diagnosis not present

## 2018-08-12 ENCOUNTER — Other Ambulatory Visit: Payer: Self-pay | Admitting: Internal Medicine

## 2018-08-19 ENCOUNTER — Encounter: Payer: Self-pay | Admitting: Radiology

## 2018-08-19 ENCOUNTER — Emergency Department: Payer: Medicare HMO

## 2018-08-19 ENCOUNTER — Other Ambulatory Visit: Payer: Self-pay

## 2018-08-19 ENCOUNTER — Emergency Department
Admission: EM | Admit: 2018-08-19 | Discharge: 2018-08-19 | Disposition: A | Discharge status: Home / Self Care | Payer: Medicare HMO | Attending: Emergency Medicine | Admitting: Emergency Medicine

## 2018-08-19 DIAGNOSIS — K8020 Calculus of gallbladder without cholecystitis without obstruction: Secondary | ICD-10-CM | POA: Diagnosis not present

## 2018-08-19 DIAGNOSIS — S39011A Strain of muscle, fascia and tendon of abdomen, initial encounter: Secondary | ICD-10-CM | POA: Insufficient documentation

## 2018-08-19 DIAGNOSIS — Y939 Activity, unspecified: Secondary | ICD-10-CM | POA: Diagnosis not present

## 2018-08-19 DIAGNOSIS — E119 Type 2 diabetes mellitus without complications: Secondary | ICD-10-CM | POA: Insufficient documentation

## 2018-08-19 DIAGNOSIS — Z96649 Presence of unspecified artificial hip joint: Secondary | ICD-10-CM | POA: Insufficient documentation

## 2018-08-19 DIAGNOSIS — Z96651 Presence of right artificial knee joint: Secondary | ICD-10-CM | POA: Diagnosis not present

## 2018-08-19 DIAGNOSIS — R1032 Left lower quadrant pain: Secondary | ICD-10-CM

## 2018-08-19 DIAGNOSIS — Y929 Unspecified place or not applicable: Secondary | ICD-10-CM | POA: Insufficient documentation

## 2018-08-19 DIAGNOSIS — X58XXXA Exposure to other specified factors, initial encounter: Secondary | ICD-10-CM | POA: Insufficient documentation

## 2018-08-19 DIAGNOSIS — Z79899 Other long term (current) drug therapy: Secondary | ICD-10-CM | POA: Insufficient documentation

## 2018-08-19 DIAGNOSIS — Z7984 Long term (current) use of oral hypoglycemic drugs: Secondary | ICD-10-CM | POA: Diagnosis not present

## 2018-08-19 DIAGNOSIS — I10 Essential (primary) hypertension: Secondary | ICD-10-CM | POA: Insufficient documentation

## 2018-08-19 DIAGNOSIS — K5730 Diverticulosis of large intestine without perforation or abscess without bleeding: Secondary | ICD-10-CM | POA: Diagnosis not present

## 2018-08-19 DIAGNOSIS — Z85828 Personal history of other malignant neoplasm of skin: Secondary | ICD-10-CM | POA: Insufficient documentation

## 2018-08-19 DIAGNOSIS — Y999 Unspecified external cause status: Secondary | ICD-10-CM | POA: Insufficient documentation

## 2018-08-19 LAB — CBC
HCT: 37.5 % (ref 36.0–46.0)
Hemoglobin: 12.1 g/dL (ref 12.0–15.0)
MCH: 29.2 pg (ref 26.0–34.0)
MCHC: 32.3 g/dL (ref 30.0–36.0)
MCV: 90.4 fL (ref 80.0–100.0)
Platelets: 287 10*3/uL (ref 150–400)
RBC: 4.15 MIL/uL (ref 3.87–5.11)
RDW: 15.1 % (ref 11.5–15.5)
WBC: 8.4 10*3/uL (ref 4.0–10.5)
nRBC: 0 % (ref 0.0–0.2)

## 2018-08-19 LAB — URINALYSIS, COMPLETE (UACMP) WITH MICROSCOPIC
Bilirubin Urine: NEGATIVE
Glucose, UA: NEGATIVE mg/dL
Hgb urine dipstick: NEGATIVE
Ketones, ur: NEGATIVE mg/dL
Nitrite: NEGATIVE
Protein, ur: NEGATIVE mg/dL
Specific Gravity, Urine: 1.008 (ref 1.005–1.030)
pH: 6 (ref 5.0–8.0)

## 2018-08-19 LAB — COMPREHENSIVE METABOLIC PANEL
ALT: 17 U/L (ref 0–44)
AST: 17 U/L (ref 15–41)
Albumin: 4 g/dL (ref 3.5–5.0)
Alkaline Phosphatase: 104 U/L (ref 38–126)
Anion gap: 9 (ref 5–15)
BUN: 15 mg/dL (ref 8–23)
CO2: 26 mmol/L (ref 22–32)
Calcium: 9 mg/dL (ref 8.9–10.3)
Chloride: 101 mmol/L (ref 98–111)
Creatinine, Ser: 0.71 mg/dL (ref 0.44–1.00)
GFR calc Af Amer: >60 mL/min (ref >60)
GFR calc non Af Amer: >60 mL/min (ref >60)
Glucose, Bld: 144 mg/dL — ABNORMAL HIGH (ref 70–99)
Potassium: 3.7 mmol/L (ref 3.5–5.1)
Sodium: 136 mmol/L (ref 135–145)
Total Bilirubin: 0.4 mg/dL (ref 0.3–1.2)
Total Protein: 6.9 g/dL (ref 6.5–8.1)

## 2018-08-19 LAB — LIPASE, BLOOD: Lipase: 23 U/L (ref 11–51)

## 2018-08-19 LAB — TROPONIN I: Troponin I: <0.03 ng/mL (ref <0.03)

## 2018-08-19 MED ORDER — IOHEXOL 240 MG/ML SOLN
50.0000 mL | Freq: Once | INTRAMUSCULAR | Status: AC | PRN
  Administered 2018-08-19: 50 mL via ORAL

## 2018-08-19 MED ORDER — KETOROLAC TROMETHAMINE 30 MG/ML IJ SOLN
15.0000 mg | Freq: Once | INTRAMUSCULAR | Status: AC
  Administered 2018-08-19: 15 mg via INTRAVENOUS
  Filled 2018-08-19: qty 1

## 2018-08-19 MED ORDER — IOHEXOL 300 MG/ML  SOLN
100.0000 mL | Freq: Once | INTRAMUSCULAR | Status: AC | PRN
  Administered 2018-08-19: 100 mL via INTRAVENOUS

## 2018-08-19 MED ORDER — IBUPROFEN 600 MG PO TABS: 600.0000 mg | ORAL_TABLET | Freq: Three times a day (TID) | ORAL | 0 refills | Status: DC | PRN

## 2018-08-19 MED ORDER — DIAZEPAM 5 MG PO TABS: 5.0000 mg | ORAL_TABLET | Freq: Three times a day (TID) | ORAL | 0 refills | Status: DC | PRN

## 2018-08-19 NOTE — ED Triage Notes (Addendum)
Pt with left upper quadrant abd pain since Thursday. Pt denies fever, shob, cough, nausea, vomiting, dizziness, diaphoresis. Pt denies diarrhea. Pt states pain is constant. Pt states has had upper back pain.

## 2018-08-19 NOTE — ED Notes (Signed)
Patient to stat desk asking about wait time. Patient given update on wait time. Patient verbalized understanding.

## 2018-08-19 NOTE — ED Provider Notes (Signed)
CT reviewed by me, likely rectus abdominis injury but no intra-abdominal process.  She is stable for outpatient follow-up.   Earleen Newport, MD 08/19/18 [phone]

## 2018-08-19 NOTE — ED Provider Notes (Signed)
[facility] Emergency Department Provider Note  ____________________________________________   First MD Initiated Contact with Patient 08/19/18 [phone]     (approximate)  I have reviewed the triage vital signs and the nursing notes.   HISTORY  Chief Complaint Abdominal Pain    HPI Katherine Jimenez is a 68 y.o. female with medical history as listed below who presents for evaluation of persistent left lower quadrant abdominal pain.  Is been going on for about 5 days and seems to be getting a little bit worse.  She denies any other symptoms.  She specifically denies fever/chills, sore throat, cough, chest pain, shortness of breath, nausea, vomiting, diarrhea, dysuria, and constipation.  She cannot think of anything in particular that makes the pain better or worse although by today or tonight when she sits up or lies back it makes the pain worse.  In general it is both a sharp and aching pain that is nonradiating and fairly constant.  She has not had similar symptoms in the past.  She had a tubal ligation previously but no other abdominal surgeries.  She has not been around any patients known to have COVID-19.         Past Medical History:  Diagnosis Date  . Arthritis    back, knees, fingers  . Bipolar disorder ([place])   . Bunion, left   . Depression   . Diabetes mellitus    diet controlled.   . Hypertension   . Lower back pain    S/P fall  . Squamous cell skin cancer, face    [facility]  . Treadmill stress test negative for angina pectoris June 2013    Patient Active Problem List   Diagnosis Date Noted  . Osteoarthritis of right knee 06/04/2017  . Coccygeal contusion, subsequent encounter 03/14/2016  . Idiopathic scoliosis 12/06/2015  . Chest pain 08/29/2015  . Insomnia secondary to depression with anxiety 05/29/2015  . Palpitations 03/02/2015  . Fibromyalgia 01/30/2015  . B12 deficiency 12/23/2014  . Diabetes mellitus without complication ([place])  96/07/5407  . Cystocele 08/24/2013  . Preoperative evaluation to rule out surgical contraindication 01/21/2013  . Overweight (BMI 25.0-29.9) 01/21/2013  . Hyperlipidemia 02/28/2012  . Degenerative lumbar spinal stenosis 09/24/2011  . Hypertension 08/31/2011  . Bipolar disorder ([place]) 08/31/2011  . Anemia 08/31/2011    Past Surgical History:  Procedure Laterality Date  . BLADDER AUGMENTATION    . CARDIAC CATHETERIZATION  03/05/12   no stents  . CATARACT EXTRACTION W/PHACO Right 07/19/2015   Procedure: CATARACT EXTRACTION PHACO AND INTRAOCULAR LENS PLACEMENT ([place]) right eye;  Surgeon: Ronnell Freshwater, MD;  Location: [place];  Service: Ophthalmology;  Laterality: Right;  BORDERLINE DIABETIC - oral meds  . CATARACT EXTRACTION W/PHACO Left 05/28/2017   Procedure: CATARACT EXTRACTION PHACO AND INTRAOCULAR LENS PLACEMENT ([place]) LEFT DIABETIC;  Surgeon: Leandrew Koyanagi, MD;  Location: [place];  Service: Ophthalmology;  Laterality: Left;  Diabetic - oal meds  . JOINT REPLACEMENT  2012   bilateral hip  . KNEE ARTHROSCOPY    . TOTAL HIP ARTHROPLASTY  2012  . TOTAL KNEE ARTHROPLASTY Right 06/04/2017   Procedure: TOTAL KNEE ARTHROPLASTY;  Surgeon: Lovell Sheehan, MD;  Location: [facility];  Service: Orthopedics;  Laterality: Right;  . TUBAL LIGATION    . VAGINAL DELIVERY     2    Prior to Admission medications   Medication Sig Start Date End Date Taking? Authorizing Provider  acetaminophen (TYLENOL) 500 MG tablet Take  1,000 mg by mouth every 6 (six) hours as needed (FOR PAIN.).    [provider]  ALPRAZolam Duanne Moron) 0.5 MG tablet TAKE 1 TABLET BY MOUTH AT BEDTIME as needed for insomnia 07/20/18   Crecencio Mc, MD  Biotin (BIOTIN 5000) 5 MG CAPS Take 2 capsules by mouth 2 (two) times daily.     [provider]  blood glucose meter kit and supplies Dispense Contour next bayer meter. E11.9 To check blood glucose  Once a day. 08/31/15   Crecencio Mc, MD  Cholecalciferol (VITAMIN D3) 1000 units CAPS Take 1,000 Units by mouth daily.     [provider]  cyanocobalamin (,VITAMIN B-12,) 1000 MCG/ML injection INJECT 1ML INTO THE MUSCLE ONCE A WEEK FOR 3 WEEKS, THEN MONTHLY THEREAFTER 12/26/16   Crecencio Mc, MD  DULoxetine (CYMBALTA) 30 MG capsule TAKE 1 CAPSULE BY MOUTH EVERY DAY 08/13/18   Crecencio Mc, MD  glucose blood (FREESTYLE LITE) test strip Use once daily  as instructed to test blood sugar E 11.9 04/29/14   Crecencio Mc, MD  lamoTRIgine (LAMICTAL) 200 MG tablet TAKE 1 TABLET BY MOUTH TWICE A DAY 05/09/18   Crecencio Mc, MD  Lancets (FREESTYLE) lancets Use once daily to check blood sugars  E11.9 04/29/14   Crecencio Mc, MD  losartan (COZAAR) 50 MG tablet TAKE 1 TABLET BY MOUTH EVERY DAY 04/11/18   Crecencio Mc, MD  metFORMIN (GLUCOPHAGE) 850 MG tablet TAKE 1 TABLET BY MOUTH TWICE DAILY WITH A MEAL 05/29/18   Crecencio Mc, MD  omeprazole (PRILOSEC) 40 MG capsule TAKE 1 CAPSULE BY MOUTH EVERY DAY 07/16/18   Crecencio Mc, MD  Syringe, Disposable, 1 ML MISC For use with B12 injections 12/23/14   Crecencio Mc, MD  traZODone (DESYREL) 50 MG tablet TAKE 1/2-1 TAB BY MOUTH AT BEDTIME 01/30/18   Crecencio Mc, MD  vitamin E 1000 UNIT capsule Take 1,000 Units by mouth daily.    [provider]    Allergies Thimerosal  Family History  Problem Relation Age of Onset  . Heart disease Mother   . Stroke Mother   . Heart disease Father   . Heart disease Maternal Grandmother   . Heart disease Maternal Grandfather   . Heart disease Paternal Grandmother   . Heart disease Paternal Grandfather   . Breast cancer Maternal Aunt     Social History Social History   Tobacco Use  . Smoking status: Never Smoker  . Smokeless tobacco: Never Used  Substance Use Topics  . Alcohol use: Yes    Alcohol/week: 1.0 standard drinks    Types: 1 Cans of beer per week    Comment: occassionally  . Drug use: No     Review of Systems Constitutional: No fever/chills Eyes: No visual changes. ENT: No sore throat. Cardiovascular: Denies chest pain. Respiratory: Denies shortness of breath. Gastrointestinal: Left lower abdominal pain as described above without nausea, vomiting, diarrhea, or constipation. Genitourinary: Negative for dysuria. Musculoskeletal: Negative for neck pain.  Negative for back pain. Integumentary: Negative for rash. Neurological: Negative for headaches, focal weakness or numbness.   ____________________________________________   PHYSICAL EXAM:  VITAL SIGNS: ED Triage Vitals  Enc Vitals Group     BP 08/19/18 0158 129/65     Pulse Rate 08/19/18 0158 98     Resp 08/19/18 0158 16     Temp 08/19/18 0158 98.1 F (36.7 C)     Temp Source 08/19/18  0158 Oral     SpO2 08/19/18 0158 98 %     Weight 08/19/18 0159 72.6 kg (160 lb)     Height 08/19/18 0159 1.524 m (5')     Head Circumference --      Peak Flow --      Pain Score 08/19/18 0159 9     Pain Loc --      Pain Edu? --      Excl. in [place]? --     Constitutional: Alert and oriented. Well appearing and in no acute distress. Eyes: Conjunctivae are normal.  Head: Atraumatic. Nose: No congestion/rhinnorhea. Mouth/Throat: Mucous membranes are moist. Neck: No stridor.  No meningeal signs.   Cardiovascular: Normal rate, regular rhythm. Good peripheral circulation. Grossly normal heart sounds. Respiratory: Normal respiratory effort.  No retractions. No audible wheezing. Gastrointestinal: Soft and nondistended.  Mildly to moderately tender to palpation in the left lower quadrant with no rebound or guarding.  No tenderness at McBurney's point and no tenderness in the upper abdomen. Musculoskeletal: No lower extremity tenderness nor edema. No gross deformities of extremities. Neurologic:  Normal speech and language. No gross focal neurologic deficits are appreciated.  Skin:  Skin is warm, dry and intact. No rash noted. Psychiatric:  Mood and affect are normal. Speech and behavior are normal.  ____________________________________________   LABS (all labs ordered are listed, but only abnormal results are displayed)  Labs Reviewed  COMPREHENSIVE METABOLIC PANEL - Abnormal; Notable for the following components:      Result Value   Glucose, Bld 144 (*)    All other components within normal limits  URINALYSIS, COMPLETE (UACMP) WITH MICROSCOPIC - Abnormal; Notable for the following components:   Color, Urine STRAW (*)    APPearance CLEAR (*)    Leukocytes,Ua TRACE (*)    Bacteria, UA RARE (*)    All other components within normal limits  LIPASE, BLOOD  CBC  TROPONIN I   ____________________________________________  EKG  ED ECG REPORT I, Hinda Kehr, the attending physician, personally viewed and interpreted this ECG.  Date: 08/19/2018 EKG Time: 2:03 AM Rate: 88 Rhythm: normal sinus rhythm QRS Axis: normal Intervals: normal ST/T Wave abnormalities: normal Narrative Interpretation: no evidence of acute ischemia  ____________________________________________  RADIOLOGY   ED MD interpretation: CT of the abdomen and pelvis with oral and IV contrast is pending.  Official radiology report(s): No results found.  ____________________________________________   PROCEDURES   Procedure(s) performed (including Critical Care):  Procedures   ____________________________________________   INITIAL IMPRESSION / MDM / [place] / ED COURSE  As part of my medical decision making, I reviewed the following data within the [place] notes reviewed and incorporated, Labs reviewed , EKG interpreted , Old chart reviewed, Patient signed out to Dr. Jimmye Norman, Notes from prior ED visits and [place] Controlled Substance Database      *DALANI METTE was evaluated in Emergency Department on 08/19/2018 for the symptoms described in the history of present illness. She was evaluated in the  context of the global COVID-19 pandemic, which necessitated consideration that the patient might be at risk for infection with the SARS-CoV-2 virus that causes COVID-19. Institutional protocols and algorithms that pertain to the evaluation of patients at risk for COVID-19 are in a state of rapid change based on information released by regulatory bodies including the CDC and federal and state organizations. These policies and algorithms were followed during the patient's care in the ED.  Some ED evaluations  and interventions may be delayed as a result of limited staffing during the pandemic.*  Differential diagnosis includes, but is not limited to, diverticulitis, musculoskeletal strain, renal colic, UTI/pyelonephritis, constipation, less likely SBO or ileus.  Neoplasm also possible though hopefully unlikely.  The patient is well-appearing and in no distress with stable vital signs.  She only has the left lower quadrant abdominal pain with no contributory symptoms.  Her comprehensive metabolic panel is within normal limits, she has no leukocytosis on CBC, and although she has trace leukocytes in her UA I do not believe this represents an actual infection and certainly does not explain her symptoms.  Her lipase and troponin are both negative and her EKG is nonischemic.  The patient is a former ED nurse and is very calm and cooperative and would not be here if something was not bothering her.  I think it is appropriate to get a CT scan of her abdomen pelvis to rule out diverticulitis or other acute intra-abdominal abnormality.  If this is reassuring she should be able to be discharged for outpatient follow-up.  She understands and agrees with the plan.  She does not want any narcotics I am ordering Toradol 15 mg IV.  Transferring ED care to Dr. Jimmye Norman to follow up CT scan.  Anticipate discharge unless a surgical emergency is revealed on CT.   Clinical Course as of Aug 18 700  Mon Aug 19, 2018  0653 CO2:  26 [CF]    Clinical Course User Index [CF] Hinda Kehr, MD     ____________________________________________  FINAL CLINICAL IMPRESSION(S) / ED DIAGNOSES  Final diagnoses:  LLQ abdominal pain     MEDICATIONS GIVEN DURING THIS VISIT:  Medications  ketorolac (TORADOL) 30 MG/ML injection 15 mg (15 mg Intravenous Given 08/19/18 0625)  iohexol (OMNIPAQUE) 240 MG/ML injection 50 mL (50 mLs Oral Contrast Given 08/19/18 0620)  iohexol (OMNIPAQUE) 300 MG/ML solution 100 mL (100 mLs Intravenous Contrast Given 08/19/18 5462)     ED Discharge Orders    None       Note:  This document was prepared using Dragon voice recognition software and may include unintentional dictation errors.   Hinda Kehr, MD 08/19/18 [phone]

## 2018-08-19 NOTE — ED Notes (Signed)
Pt to CT via wheelchair

## 2018-08-19 NOTE — ED Notes (Signed)
Patient updated on wait time. Patient verbalizes understanding.  

## 2018-08-19 NOTE — ED Notes (Signed)
Patient ambulatory to lobby with steady gait and NAD noted. Verbalized understanding of discharge instructions and follow-up care.  

## 2018-08-20 ENCOUNTER — Ambulatory Visit: Payer: Self-pay

## 2018-08-20 NOTE — Telephone Encounter (Signed)
Patient called and says she went to the ED on Sunday night for abdominal pain, she says she had a CT that showed something abdominal with rectum on it. She says they gave her medication, valium and advil to take. She says she's still having the pain and asked if Dr. Derrel Nip has any other recommendation that she will need to take. She says it's no rush, just whenever Dr. Derrel Nip can get back to her. I advised the office is closed and someone will call her tomorrow with her recommendation, patient verbalized understanding.  Reason for Disposition . [1] Caller requesting NON-URGENT health information AND [2] PCP's office is the best resource  Protocols used: INFORMATION ONLY CALL-A-AH

## 2018-08-21 ENCOUNTER — Telehealth: Payer: Self-pay

## 2018-08-21 NOTE — Telephone Encounter (Signed)
Tried to reach patient by phone no answer left voicemail to call office .Will need a virtual visit scheduled.

## 2018-08-21 NOTE — Telephone Encounter (Signed)
Copied from [place] [phone]. Topic: General - Other >> Aug 21, 2018 12:26 PM Keene Breath wrote: Reason for CRM: Patient is returning a call to [facility].  Patient stated that before she schedules a virtual appt., she would like to speak with the nurse about some issues.  Please call her back at [phone]

## 2018-08-21 NOTE — Telephone Encounter (Signed)
Spoke with patient feels really bad and is having abdominal pain, and says the pain is coming from the umbilical area she thinks maybe a hernia . She does not feel like she can do a visit right now just wants to know if you can make a suggestion pain is more with movement feel like someone is stabbing her .Was seen in ED on Sunday abd CT attained Impression pasted below. Refused Virtual or telephone visit.  IMPRESSION: 1. Focal thickening and indistinctness of the upper left rectus abdominus, likely a small rectus sheath hematoma. 2. No acute intra-abdominal finding. There is left colonic diverticulosis without active diverticulitis. 3. Cholelithiasis.

## 2018-08-21 NOTE — Telephone Encounter (Signed)
A hernia would have been evident on the ct scan.  She had a rectus sheath hematoma . i'll call her

## 2018-08-21 NOTE — Telephone Encounter (Signed)
Discussed with patient. Pain is in the area of the rectus hematoma.  Gallstones also noted on CT but her pain is NOT RUQ.   Advised to use ice packs on hematoma,  Ibuprofen 600 mg every 8 and acetaminophen 100 mg every 8 hours

## 2018-08-22 ENCOUNTER — Other Ambulatory Visit: Payer: Self-pay | Admitting: Internal Medicine

## 2018-08-23 ENCOUNTER — Other Ambulatory Visit: Payer: Self-pay | Admitting: Internal Medicine

## 2018-09-27 DIAGNOSIS — L821 Other seborrheic keratosis: Secondary | ICD-10-CM | POA: Diagnosis not present

## 2018-09-27 DIAGNOSIS — L820 Inflamed seborrheic keratosis: Secondary | ICD-10-CM | POA: Diagnosis not present

## 2018-09-27 DIAGNOSIS — L578 Other skin changes due to chronic exposure to nonionizing radiation: Secondary | ICD-10-CM | POA: Diagnosis not present

## 2018-09-29 ENCOUNTER — Other Ambulatory Visit: Payer: Self-pay | Admitting: Internal Medicine

## 2018-10-14 ENCOUNTER — Other Ambulatory Visit: Payer: Self-pay

## 2018-10-14 ENCOUNTER — Ambulatory Visit (INDEPENDENT_AMBULATORY_CARE_PROVIDER_SITE_OTHER): Payer: Medicare HMO

## 2018-10-14 DIAGNOSIS — Z0000 Encounter for general adult medical examination without abnormal findings: Secondary | ICD-10-CM | POA: Diagnosis not present

## 2018-10-14 NOTE — Patient Instructions (Addendum)
  Katherine Jimenez , Thank you for taking time to come for your Medicare Wellness Visit. I appreciate your ongoing commitment to your health goals. Please review the following plan we discussed and let me know if I can assist you in the future.   These are the goals we discussed: Goals      Patient Stated   . DIET - REDUCE SUGAR INTAKE (pt-stated)     Reduce amount of chocolate intake Portion control meals       This is a list of the screening recommended for you and due dates:  Health Maintenance  Topic Date Due  .  Hepatitis C: One time screening is recommended by Center for Disease Control  (CDC) for  adults born from [birth year] through 1964.   1950/05/17  . Tetanus Vaccine  02/15/1971  . DEXA scan (bone density measurement)  02/14/2017  . Pneumonia vaccines (1 of 2 - PCV13) 02/14/2017  . Complete foot exam   09/12/2017  . Flu Shot  11/02/2018  . Hemoglobin A1C  01/07/2019  . Mammogram  02/08/2019  . Eye exam for diabetics  05/17/2019  . Colon Cancer Screening  01/17/2026

## 2018-10-14 NOTE — Progress Notes (Addendum)
Subjective:   Katherine Jimenez is a 68 y.o. female who presents for an Initial Medicare Annual Wellness Visit.  Review of Systems    No ROS.  Medicare Wellness Virtual Visit.  Visual/audio telehealth visit, UTA vital signs.   See social history for additional risk factors.    Cardiac Risk Factors include: advanced age (>24mn, >>17women)     Objective:    Today's Vitals   There is no height or weight on file to calculate BMI.  Advanced Directives 10/14/2018 08/19/2018 06/04/2017 05/28/2017 05/23/2017 02/26/2016 08/25/2015  Does Patient Have a Medical Advance Directive? _0  No No  Would patient like information on creating a medical advance directive? No - Patient declined - Yes (Inpatient - patient requests chaplain consult to create a medical advance directive) No - Patient declined - No - Patient declined No - patient declined information    Current Medications (verified) Outpatient Encounter Medications as of 10/14/2018  Medication Sig  . acetaminophen (TYLENOL) 500 MG tablet Take 1,000 mg by mouth every 6 (six) hours as needed (FOR PAIN.).  .Marland KitchenALPRAZolam (XANAX) 0.5 MG tablet TAKE 1 TABLET BY MOUTH AT BEDTIME as needed for insomnia  . Biotin (BIOTIN 5000) 5 MG CAPS Take 2 capsules by mouth 2 (two) times daily.   . blood glucose meter kit and supplies Dispense Contour next bayer meter. E11.9 To check blood glucose  Once a day.  . Cholecalciferol (VITAMIN D3) 1000 units CAPS Take 1,000 Units by mouth daily.   . cyanocobalamin (,VITAMIN B-12,) 1000 MCG/ML injection INJECT 1ML INTO THE MUSCLE ONCE A WEEK FOR 3 WEEKS, THEN MONTHLY THEREAFTER  . diazepam (VALIUM) 5 MG tablet Take 1 tablet (5 mg total) by mouth every 8 (eight) hours as needed for muscle spasms.  . DULoxetine (CYMBALTA) 30 MG capsule TAKE 1 CAPSULE BY MOUTH EVERY DAY  . glucose blood (FREESTYLE LITE) test strip Use once daily  as instructed to test blood sugar E 11.9  . ibuprofen (ADVIL) 600 MG tablet Take 1  tablet (600 mg total) by mouth every 8 (eight) hours as needed.  . lamoTRIgine (LAMICTAL) 200 MG tablet TAKE 1 TABLET BY MOUTH TWICE A DAY  . Lancets (FREESTYLE) lancets Use once daily to check blood sugars  E11.9  . losartan (COZAAR) 50 MG tablet TAKE 1 TABLET BY MOUTH EVERY DAY  . metFORMIN (GLUCOPHAGE) 850 MG tablet TAKE 1 TABLET BY MOUTH TWICE DAILY WITH A MEAL  . omeprazole (PRILOSEC) 40 MG capsule TAKE 1 CAPSULE BY MOUTH EVERY DAY  . Syringe, Disposable, 1 ML MISC For use with B12 injections  . traZODone (DESYREL) 50 MG tablet TAKE 1/2-1 TAB BY MOUTH AT BEDTIME  . vitamin E 1000 UNIT capsule Take 1,000 Units by mouth daily.   No facility-administered encounter medications on file as of 10/14/2018.     Allergies (verified) Thimerosal   History: Past Medical History:  Diagnosis Date  . Arthritis    back, knees, fingers  . [place]   . Bunion, left   . Depression   . Diabetes mellitus    diet controlled.   . Hypertension   . Lower back pain    S/P fall  . Squamous cell skin cancer, face    [facility]  . Treadmill stress test negative for angina pectoris June 2013   Past Surgical History:  Procedure Laterality Date  . BLADDER AUGMENTATION    . CARDIAC CATHETERIZATION  03/05/12   no stents  .  CATARACT EXTRACTION W/PHACO Right 07/19/2015   Procedure: CATARACT EXTRACTION PHACO AND INTRAOCULAR LENS PLACEMENT ([place]) right eye;  Surgeon: Ronnell Freshwater, MD;  Location: [place];  Service: Ophthalmology;  Laterality: Right;  BORDERLINE DIABETIC - oral meds  . CATARACT EXTRACTION W/PHACO Left 05/28/2017   Procedure: CATARACT EXTRACTION PHACO AND INTRAOCULAR LENS PLACEMENT ([place]) LEFT DIABETIC;  Surgeon: Leandrew Koyanagi, MD;  Location: [place];  Service: Ophthalmology;  Laterality: Left;  Diabetic - oal meds  . JOINT REPLACEMENT  2012   bilateral hip  . KNEE ARTHROSCOPY    . TOTAL HIP ARTHROPLASTY  2012  . TOTAL KNEE ARTHROPLASTY Right  06/04/2017   Procedure: TOTAL KNEE ARTHROPLASTY;  Surgeon: Lovell Sheehan, MD;  Location: [facility];  Service: Orthopedics;  Laterality: Right;  . TUBAL LIGATION    . VAGINAL DELIVERY     2   Family History  Problem Relation Age of Onset  . Heart disease Mother   . Stroke Mother   . Heart disease Father   . Dementia Father   . Heart disease Maternal Grandmother   . Heart disease Maternal Grandfather   . Heart disease Paternal Grandmother   . Heart disease Paternal Grandfather   . Breast cancer Maternal Aunt    Social History   Socioeconomic History  . Marital status: Married    Spouse name: Not on file  . Number of children: Not on file  . Years of education: Not on file  . Highest education level: Not on file  Occupational History  . Not on file  Social Needs  . Financial resource strain: Not hard at all  . Food insecurity    Worry: Never true    Inability: Never true  . Transportation needs    Medical: No    Non-medical: No  Tobacco Use  . Smoking status: Never Smoker  . Smokeless tobacco: Never Used  Substance and Sexual Activity  . Alcohol use: Yes    Alcohol/week: 1.0 standard drinks    Types: 1 Cans of beer per week    Comment: occassionally  . Drug use: No  . Sexual activity: Not on file  Lifestyle  . Physical activity    Days per week: 3 days    Minutes per session: 30 min  . Stress: Only a little  Relationships  . Social Herbalist on phone: Not on file    Gets together: Not on file    Attends religious service: Not on file    Active member of club or organization: Not on file    Attends meetings of clubs or organizations: Not on file    Relationship status: Not on file  Other Topics Concern  . Not on file  Social History Narrative   Lives in [place] with husband.          Tobacco Counseling Counseling given: Not Answered   Clinical Intake:  Pre-visit preparation completed: Yes        Diabetes: Yes(Followed by pcp)   How often do you need to have someone help you when you read instructions, pamphlets, or other written materials from your doctor or pharmacy?: 1 - Never  Interpreter Needed?: No      Activities of Daily Living In your present state of health, do you have any difficulty performing the following activities: 10/14/2018  Hearing? N  Vision? N  Difficulty concentrating or making decisions? N  Walking or climbing stairs? N  Dressing or bathing? N  Doing errands, shopping? N  Preparing Food and eating ? N  Using the Toilet? N  In the past six months, have you accidently leaked urine? N  Do you have problems with loss of bowel control? N  Managing your Medications? N  Managing your Finances? N  Housekeeping or managing your Housekeeping? N  Some recent data might be hidden     Immunizations and Health Maintenance Immunization History  Administered Date(s) Administered  . Influenza-Unspecified 12/02/2013, 12/31/2014, 12/27/2015, 01/03/2017, 11/24/2017  . Pneumococcal Polysaccharide-23 04/22/2015   Health Maintenance Due  Topic Date Due  . Hepatitis C Screening  02-26-1951  . TETANUS/TDAP  02/15/1971  . DEXA SCAN  02/14/2017  . PNA vac Low Risk Adult (1 of 2 - PCV13) 02/14/2017  . FOOT EXAM  09/12/2017    Patient Care Team: Crecencio Mc, MD as PCP - General (Internal Medicine)  Indicate any recent Medical Services you may have received from other than [facility] providers in the past year (date may be approximate).     Assessment:   This is a routine wellness examination for [place].  I connected with patient 10/14/18 at 12:00 PM EDT by a video/audio enabled telemedicine application and verified that I am speaking with the correct person using two identifiers. Patient stated full name and DOB. Patient gave permission to continue with virtual visit. Patient's location was at home and Nurse's location was at [place] office.   Medication- patient states she has not been taking  metformin as directed. Taking half dose maybe once a week due to stomach upset since given the medication. Last dose 1 week ago. Deferred to pcp for medication management. Reports taking all other medications as prescribed.   Depression/anxiety- notes a slight increase in anxiety due to family stressors and considers an increase in antidepressant. Sleeping through the night. No harmful thoughts towards self or others.Taking medication as directed. Deferred to pcp for follow up.   Memory- Patient has concerns of difficulty recalling short term information and forming words when speaking from time to time.  Speech sounds fine today. She notes family Hx of mother with Alzheimers and father with Dementia. Recall today 4/5 words, months in reverse, day/time/year and calculation correctly completed. Requests MRI or neurological baseline.  Deferred to pcp for follow up.   Appointment scheduled with her doctor for follow up on topics listed above 7/15.   Diabetes- notes she spot checks her blood sugar and cannot recall what she averages.   Health Screenings  Mammogram - 02/2018 Colonoscopy - 01/2016 Glaucoma -none Hearing -demonstrates normal hearing during visit. Hemoglobin A1C - 07/2018 Cholesterol - 07/2018 Dental- UTD Vision- visits within the last 12 months.  Social  Alcohol intake - yes      Smoking history- never    Smokers in home? none Illicit drug use? none Exercise - swimming/water aerobics 3 days, 45-60 Diet - regular Sexually Active -not currently BMI- discussed the importance of a healthy diet, water intake and the benefits of aerobic exercise.  Educational material provided.   Safety  Patient feels safe at home- yes Patient does have smoke detectors at home- yes Patient does wear sunscreen or protective clothing when in direct sunlight -yes Patient does wear seat belt when in a moving vehicle -yes Patient drives- yes  [id number] precautions and sickness symptoms discussed.    Activities of Daily Living Patient denies needing assistance with: driving, household chores, feeding themselves, getting from bed to chair, getting to the toilet, bathing/showering, dressing, managing money,  or preparing meals.  No new identified risk were noted.    Fall Screen Patient denies being afraid of falling or falling in the last year.   Immunizations The following Immunizations were discussed: Influenza, shingles, pneumonia, and tetanus.   Other Providers Patient Care Team: Tullo, Teresa L, MD as PCP - General (Internal Medicine)  Hearing/Vision screen  Hearing Screening   125Hz 250Hz 500Hz 1000Hz 2000Hz 3000Hz 4000Hz 6000Hz 8000Hz  Right ear:           Left ear:           Comments: Patient is able to hear conversational tones without difficulty.  No issues reported.  Vision Screening Comments: Cataract extraction, bilateral Visual acuity not assessed, virtual visit.  They have seen their ophthalmologist in the last 12 months.     Dietary issues and exercise activities discussed: Current Exercise Habits: Home exercise routine  Goals      Patient Stated   . DIET - REDUCE SUGAR INTAKE (pt-stated)     Reduce amount of chocolate intake Portion control meals      Depression Screen PHQ 2/9 Scores 10/14/2018 03/10/2016  PHQ - 2 Score 1 0    Fall Risk Fall Risk  10/14/2018 03/10/2016  Falls in the past year? 1 Yes  Number falls in past yr: 0 -  Injury with Fall? 0 -   Cognitive Function:     6CIT Screen 10/14/2018  What Year? 0 points  What month? 0 points  What time? 0 points  Count back from 20 0 points  Months in reverse 0 points  Repeat phrase 0 points  Total Score 0    Screening Tests Health Maintenance  Topic Date Due  . Hepatitis C Screening  02/09/1951  . TETANUS/TDAP  02/15/1971  . DEXA SCAN  02/14/2017  . PNA vac Low Risk Adult (1 of 2 - PCV13) 02/14/2017  . FOOT EXAM  09/12/2017  . INFLUENZA VACCINE  11/02/2018  . HEMOGLOBIN A1C   01/07/2019  . MAMMOGRAM  02/08/2019  . OPHTHALMOLOGY EXAM  05/17/2019  . COLONOSCOPY  01/17/2026     Plan:   End of life planning; Advanced aging; Advanced directives discussed.  No HCPOA/Living Will.  Additional information declined at this time.  Keep all routine appointments.   I have personally reviewed and noted the following in the patient's chart:   . Medical and social history . Use of alcohol, tobacco or illicit drugs  . Current medications and supplements . Functional ability and status . Nutritional status . Physical activity . Advanced directives . List of other physicians . Hospitalizations, surgeries, and ER visits in previous 12 months . Vitals . Screenings to include cognitive, depression, and falls . Referrals and appointments  In addition, I have reviewed and discussed with patient certain preventive protocols, quality metrics, and best practice recommendations. A written personalized care plan for preventive services as well as general preventive health recommendations were provided to patient.     OBrien-Blaney,  L, LPN   10/14/2018     I have reviewed the above information and agree with above.   Teresa Tullo, MD   

## 2018-10-16 ENCOUNTER — Ambulatory Visit (INDEPENDENT_AMBULATORY_CARE_PROVIDER_SITE_OTHER): Payer: Medicare HMO | Admitting: Internal Medicine

## 2018-10-16 ENCOUNTER — Other Ambulatory Visit: Payer: Self-pay

## 2018-10-16 ENCOUNTER — Encounter: Payer: Self-pay | Admitting: Internal Medicine

## 2018-10-16 DIAGNOSIS — F418 Other specified anxiety disorders: Secondary | ICD-10-CM | POA: Diagnosis not present

## 2018-10-16 DIAGNOSIS — R69 Illness, unspecified: Secondary | ICD-10-CM | POA: Diagnosis not present

## 2018-10-16 DIAGNOSIS — R4181 Age-related cognitive decline: Secondary | ICD-10-CM

## 2018-10-16 DIAGNOSIS — R4189 Other symptoms and signs involving cognitive functions and awareness: Secondary | ICD-10-CM | POA: Diagnosis not present

## 2018-10-16 DIAGNOSIS — F01A4 Vascular dementia, mild, with anxiety: Secondary | ICD-10-CM | POA: Insufficient documentation

## 2018-10-16 NOTE — Progress Notes (Signed)
Virtual Visit via [email or web address] Note  This visit type was conducted due to national recommendations for restrictions regarding the COVID-19 pandemic (e.g. social distancing).  This format is felt to be most appropriate for this patient at this time.  All issues noted in this document were discussed and addressed.  No physical exam was performed (except for noted visual exam findings with Video Visits).   I connected with@ on 10/16/18 at  4:30 PM EDT by a video enabled telemedicine application  and verified that I am speaking with the correct person using two identifiers. Location patient: home Location provider: work or home office Persons participating in the virtual visit: patient, provider  I discussed the limitations, risks, security and privacy concerns of performing an evaluation and management service by telephone and the availability of in person appointments. I also discussed with the patient that there may be a patient responsible charge related to this service. The patient expressed understanding and agreed to proceed.  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  Has also had some loss of balance with 2 falls with blunt head trauma in the last 3 months as a result of slip and falls. No LOC or persistent headache.   Has bladder incontinence occasionally but this predates the  memory issues .   ROS: Patient denies headache, fevers, malaise, unintentional weight loss, skin rash, eye pain,  sinus congestion and sinus pain, sore throat, dysphagia,  hemoptysis , cough, dyspnea, wheezing, chest pain, palpitations, orthopnea, edema, abdominal pain, nausea, melena, diarrhea, constipation, flank pain, dysuria, hematuria, , numbness, tingling, seizures,  Focal weakness, Loss of consciousness,  Tremor, insomnia, depression, , and suicidal ideation.      Past Medical History:  Diagnosis Date  . Arthritis    back, knees, fingers  . Bipolar disorder ([place])   . Bunion, left   . Depression   . Diabetes mellitus    diet controlled.   . Hypertension   . Lower back pain    S/P fall  . Squamous cell skin cancer, face    [facility]  . Treadmill stress test negative for angina pectoris June 2013    Past Surgical History:  Procedure Laterality Date  . BLADDER AUGMENTATION    . CARDIAC CATHETERIZATION  03/05/12   no stents  . CATARACT EXTRACTION W/PHACO Right 07/19/2015   Procedure: CATARACT EXTRACTION PHACO AND INTRAOCULAR LENS PLACEMENT ([place]) right eye;  Surgeon: Ronnell Freshwater, MD;  Location: [place];  Service: Ophthalmology;  Laterality: Right;  BORDERLINE DIABETIC - oral meds  . CATARACT EXTRACTION W/PHACO Left 05/28/2017   Procedure: CATARACT EXTRACTION PHACO AND INTRAOCULAR LENS PLACEMENT ([place]) LEFT DIABETIC;  Surgeon: Leandrew Koyanagi, MD;  Location: [place];  Service: Ophthalmology;  Laterality: Left;  Diabetic - oal meds  . JOINT REPLACEMENT  2012   bilateral hip  . KNEE ARTHROSCOPY    . TOTAL HIP ARTHROPLASTY  2012  . TOTAL KNEE ARTHROPLASTY Right  06/04/2017   Procedure: TOTAL KNEE ARTHROPLASTY;  Surgeon: Lovell Sheehan, MD;  Location: [facility];  Service: Orthopedics;  Laterality: Right;  . TUBAL LIGATION    . VAGINAL DELIVERY     2    Family History  Problem Relation Age of Onset  . Heart disease Mother   . Stroke Mother   . Heart disease Father   . Dementia Father   . Heart disease Maternal Grandmother   . Heart disease Maternal  Grandfather   . Heart disease Paternal Grandmother   . Heart disease Paternal Grandfather   . Breast cancer Maternal Aunt     SOCIAL HX:  reports that she has never smoked. She has never used smokeless tobacco. She reports current alcohol use of about 1.0 standard drinks of alcohol per week. She reports that she does not use drugs.   Current Outpatient Medications:  .  acetaminophen (TYLENOL) 500 MG tablet, Take 1,000 mg by mouth every 6 (six) hours as needed (FOR PAIN.)., Disp: , Rfl:  .  ALPRAZolam (XANAX) 0.5 MG tablet, TAKE 1 TABLET BY MOUTH AT BEDTIME as needed for insomnia, Disp: 30 tablet, Rfl: 5 .  Biotin (BIOTIN 5000) 5 MG CAPS, Take 2 capsules by mouth 2 (two) times daily. , Disp: , Rfl:  .  blood glucose meter kit and supplies, Dispense Contour next bayer meter. E11.9 To check blood glucose  Once a day., Disp: 1 each, Rfl: 0 .  Cholecalciferol (VITAMIN D3) 1000 units CAPS, Take 1,000 Units by mouth daily. , Disp: , Rfl:  .  cyanocobalamin (,VITAMIN B-12,) 1000 MCG/ML injection, INJECT 1ML INTO THE MUSCLE ONCE A WEEK FOR 3 WEEKS, THEN MONTHLY THEREAFTER, Disp: 10 mL, Rfl: 3 .  DULoxetine (CYMBALTA) 30 MG capsule, TAKE 1 CAPSULE BY MOUTH EVERY DAY, Disp: 90 capsule, Rfl: 1 .  glucose blood (FREESTYLE LITE) test strip, Use once daily  as instructed to test blood sugar E 11.9, Disp: 100 each, Rfl: 3 .  ibuprofen (ADVIL) 600 MG tablet, Take 1 tablet (600 mg total) by mouth every 8 (eight) hours as needed., Disp: 30 tablet, Rfl: 0 .  lamoTRIgine (LAMICTAL) 200 MG tablet, TAKE 1 TABLET BY MOUTH TWICE A DAY, Disp: 180 tablet, Rfl: 1 .  Lancets (FREESTYLE) lancets, Use once daily to check blood sugars  E11.9, Disp: 100 each, Rfl: 3 .  losartan (COZAAR) 50 MG tablet, TAKE 1 TABLET BY MOUTH EVERY DAY, Disp: 90 tablet, Rfl: 1 .  metFORMIN (GLUCOPHAGE) 850 MG tablet, TAKE 1 TABLET BY MOUTH TWICE DAILY WITH A MEAL, Disp: 180 tablet, Rfl: 1 .  omeprazole (PRILOSEC) 40 MG capsule, TAKE 1 CAPSULE BY  MOUTH EVERY DAY, Disp: 90 capsule, Rfl: 0 .  Syringe, Disposable, 1 ML MISC, For use with B12 injections, Disp: 25 each, Rfl: 3 .  traZODone (DESYREL) 50 MG tablet, TAKE 1/2-1 TAB BY MOUTH AT BEDTIME, Disp: 90 tablet, Rfl: 1 .  vitamin E 1000 UNIT capsule, Take 1,000 Units by mouth daily., Disp: , Rfl:  .  diazepam (VALIUM) 5 MG tablet, Take 1 tablet (5 mg total) by mouth every 8 (eight) hours as needed for muscle spasms. (Patient not taking: Reported on 10/16/2018), Disp: 20 tablet, Rfl: 0 .  losartan (COZAAR) 25 MG tablet, TAKE TWO TABLETS BY MOUTH ONCE DAILY, Disp: 180 tablet, Rfl: 1  EXAM:  VITALS per patient if applicable:  GENERAL: alert, oriented, appears well and in no acute distress  HEENT: atraumatic, conjunttiva clear, no obvious abnormalities on  inspection of external nose and ears  NECK: normal movements of the head and neck  LUNGS: on inspection no signs of respiratory distress, breathing rate appears normal, no obvious gross SOB, gasping or wheezing  CV: no obvious cyanosis  MS: moves all visible extremities without noticeable abnormality  PSYCH/NEURO: pleasant and cooperative, no obvious depression or anxiety, speech and thought processing grossly intact  ASSESSMENT AND PLAN:  Situational anxiety Secondary TO FAMILY MATTERS . Increasing cymbalta dose to 60 mg daily .    Cognitive changes Dd includes  Trauma induced , anxiety induced cognitive changes, early dementia, or NPH.  MRI and Neurology referral made. Labs for reversible causes needed but these will be done by neurology.  MRI ordered.  Lab Results  Component Value Date   [id number] 615 10/20/2015       I discussed the assessment and treatment plan with the patient. The patient was provided an opportunity to ask questions and all were answered. The patient agreed with the plan and demonstrated an understanding of the instructions.   The patient was advised to call back or seek an in-person evaluation if  the symptoms worsen or if the condition fails to improve as anticipated.  I provided 25 minutes of non-face-to-face time during this encounter.   Crecencio Mc, MD

## 2018-10-18 ENCOUNTER — Other Ambulatory Visit: Payer: Self-pay | Admitting: Internal Medicine

## 2018-10-19 NOTE — Assessment & Plan Note (Signed)
Secondary TO FAMILY MATTERS . Increasing cymbalta dose to 60 mg daily .

## 2018-10-19 NOTE — Assessment & Plan Note (Signed)
Dd includes  Trauma induced , anxiety induced cognitive changes, early dementia, or NPH.  MRI and Neurology referral made. Labs for reversible causes needed but these will be done by neurology.  MRI ordered.  Lab Results  Component Value Date   [id number] 10/20/2015

## 2018-10-30 ENCOUNTER — Telehealth: Payer: Self-pay | Admitting: Internal Medicine

## 2018-10-30 DIAGNOSIS — R4189 Other symptoms and signs involving cognitive functions and awareness: Secondary | ICD-10-CM

## 2018-10-30 DIAGNOSIS — N3941 Urge incontinence: Secondary | ICD-10-CM

## 2018-10-30 DIAGNOSIS — H814 Vertigo of central origin: Secondary | ICD-10-CM

## 2018-10-30 NOTE — Telephone Encounter (Signed)
MRI reordered.  If it is denied again,  She should see the neurologist as planned and he will be able to get it approved

## 2018-10-30 NOTE — Telephone Encounter (Signed)
An order was put in for an MRI but the patient's insurance company have denied it.  The patient would like for the provider to put it through again.

## 2018-10-31 NOTE — Telephone Encounter (Signed)
Spoke with pt to let her know that the MRI has been reordered but that if it gets denied again she will need to see neurology first. Pt gave a verbal understanding.

## 2018-11-05 ENCOUNTER — Telehealth: Payer: Self-pay

## 2018-11-05 NOTE — Telephone Encounter (Signed)
Pt returned call form Dr Demetrios Isaacs office nd message given to pt about MRI date and time and phone number to call in case of rescheduling. Pt verbalized understanding.

## 2018-11-11 ENCOUNTER — Other Ambulatory Visit: Payer: Self-pay | Admitting: Internal Medicine

## 2018-11-12 ENCOUNTER — Other Ambulatory Visit: Payer: Self-pay | Admitting: Internal Medicine

## 2018-11-13 ENCOUNTER — Other Ambulatory Visit: Payer: Self-pay | Admitting: Internal Medicine

## 2018-11-16 ENCOUNTER — Ambulatory Visit
Admission: RE | Admit: 2018-11-16 | Discharge: 2018-11-16 | Disposition: A | Discharge status: Home / Self Care | Payer: Medicare HMO | Source: Ambulatory Visit | Attending: Internal Medicine | Admitting: Internal Medicine

## 2018-11-16 ENCOUNTER — Other Ambulatory Visit: Payer: Self-pay

## 2018-11-16 DIAGNOSIS — N3941 Urge incontinence: Secondary | ICD-10-CM | POA: Diagnosis not present

## 2018-11-16 DIAGNOSIS — R42 Dizziness and giddiness: Secondary | ICD-10-CM | POA: Diagnosis not present

## 2018-11-16 DIAGNOSIS — R4189 Other symptoms and signs involving cognitive functions and awareness: Secondary | ICD-10-CM | POA: Diagnosis not present

## 2018-11-16 DIAGNOSIS — H814 Vertigo of central origin: Secondary | ICD-10-CM | POA: Diagnosis not present

## 2018-11-16 DIAGNOSIS — R413 Other amnesia: Secondary | ICD-10-CM | POA: Diagnosis not present

## 2018-11-16 LAB — POCT I-STAT CREATININE: Creatinine, Ser: 0.7 mg/dL (ref 0.44–1.00)

## 2018-11-16 MED ORDER — GADOBUTROL 1 MMOL/ML IV SOLN
7.0000 mL | Freq: Once | INTRAVENOUS | Status: AC | PRN
  Administered 2018-11-16: 7 mL via INTRAVENOUS

## 2018-11-20 DIAGNOSIS — R69 Illness, unspecified: Secondary | ICD-10-CM | POA: Diagnosis not present

## 2018-11-29 ENCOUNTER — Other Ambulatory Visit: Payer: Self-pay | Admitting: Internal Medicine

## 2018-12-04 DIAGNOSIS — G894 Chronic pain syndrome: Secondary | ICD-10-CM | POA: Diagnosis not present

## 2018-12-04 DIAGNOSIS — M545 Low back pain: Secondary | ICD-10-CM | POA: Diagnosis not present

## 2018-12-27 ENCOUNTER — Telehealth: Payer: Self-pay

## 2018-12-27 DIAGNOSIS — N819 Female genital prolapse, unspecified: Secondary | ICD-10-CM

## 2018-12-27 DIAGNOSIS — R32 Unspecified urinary incontinence: Secondary | ICD-10-CM

## 2018-12-27 DIAGNOSIS — N814 Uterovaginal prolapse, unspecified: Secondary | ICD-10-CM

## 2018-12-27 NOTE — Telephone Encounter (Signed)
Copied from [place] [phone]. Topic: Referral - Request for Referral >> Dec 27, 2018  1:43 PM Percell Belt A wrote: Has patient seen PCP for this complaint? Yes  *If NO, is insurance requiring patient see PCP for this issue before PCP can refer them? Referral for which specialty: Urology Preferred provider/office: Dr Ricci Barker [facility]  Reason for referral: bladder leakage

## 2018-12-28 ENCOUNTER — Other Ambulatory Visit: Payer: Self-pay | Admitting: Internal Medicine

## 2018-12-31 NOTE — Addendum Note (Signed)
Addended by: Crecencio Mc on: 12/31/2018 09:36 AM   Modules accepted: Orders

## 2018-12-31 NOTE — Telephone Encounter (Signed)
Referral is in process as requested 

## 2019-01-02 ENCOUNTER — Other Ambulatory Visit: Payer: Self-pay | Admitting: Internal Medicine

## 2019-01-03 ENCOUNTER — Other Ambulatory Visit: Payer: Self-pay | Admitting: Internal Medicine

## 2019-01-03 DIAGNOSIS — Z760 Encounter for issue of repeat prescription: Secondary | ICD-10-CM

## 2019-01-06 NOTE — Telephone Encounter (Signed)
Refill for 30 days only.  OFFICE VISIT NEEDED prior to any more refills 

## 2019-01-06 NOTE — Telephone Encounter (Signed)
Refilled: 07/20/2018 Last OV: 10/16/2018 Next OV: not scheduled.

## 2019-01-16 DIAGNOSIS — R4189 Other symptoms and signs involving cognitive functions and awareness: Secondary | ICD-10-CM | POA: Diagnosis not present

## 2019-01-16 DIAGNOSIS — E519 Thiamine deficiency, unspecified: Secondary | ICD-10-CM | POA: Diagnosis not present

## 2019-01-16 DIAGNOSIS — E538 Deficiency of other specified B group vitamins: Secondary | ICD-10-CM | POA: Diagnosis not present

## 2019-01-25 ENCOUNTER — Other Ambulatory Visit: Payer: Self-pay | Admitting: Internal Medicine

## 2019-01-26 DIAGNOSIS — M546 Pain in thoracic spine: Secondary | ICD-10-CM | POA: Diagnosis not present

## 2019-01-26 DIAGNOSIS — M1990 Unspecified osteoarthritis, unspecified site: Secondary | ICD-10-CM | POA: Diagnosis not present

## 2019-01-28 DIAGNOSIS — R4189 Other symptoms and signs involving cognitive functions and awareness: Secondary | ICD-10-CM | POA: Diagnosis not present

## 2019-01-31 ENCOUNTER — Other Ambulatory Visit: Payer: Self-pay | Admitting: Pain Medicine

## 2019-01-31 DIAGNOSIS — M545 Low back pain, unspecified: Secondary | ICD-10-CM

## 2019-01-31 DIAGNOSIS — M5416 Radiculopathy, lumbar region: Secondary | ICD-10-CM

## 2019-02-03 ENCOUNTER — Other Ambulatory Visit: Payer: Self-pay

## 2019-02-03 ENCOUNTER — Encounter: Payer: Self-pay | Admitting: Urology

## 2019-02-03 ENCOUNTER — Ambulatory Visit (INDEPENDENT_AMBULATORY_CARE_PROVIDER_SITE_OTHER): Payer: Medicare HMO | Admitting: Urology

## 2019-02-03 VITALS — BP 146/81 | HR 116 | Ht 60.0 in | Wt 165.0 lb

## 2019-02-03 DIAGNOSIS — N3946 Mixed incontinence: Secondary | ICD-10-CM

## 2019-02-03 DIAGNOSIS — R32 Unspecified urinary incontinence: Secondary | ICD-10-CM | POA: Diagnosis not present

## 2019-02-03 LAB — BLADDER SCAN AMB NON-IMAGING

## 2019-02-03 NOTE — Progress Notes (Signed)
02/03/2019 9:35 AM   Katherine Jimenez 05-23-1950 [id number]  Referring provider: Crecencio Mc, MD [place],  [place] [address]  Chief Complaint  Patient presents with  . Urinary Incontinence    New patient    HPI: I was consulted to assess the patient's urinary incontinence worsening over many years.  She leaks with coughing sneezing bending lifting.  Sometimes she has urge incontinence and describes foot on the floor syndrome.  She wears 2 or 3 pads a day that are damp and changes tissue as well.  No bedwetting  She voids every 3 or 4 hours with good flow and gets up once or twice a night  It appears she likely did not fill the Myrbetriq prescription due to cost.  She was switched to another medication over the phone and it failed  She is on oral hypoglycemics.  She may have had a TIA years ago.  She is not had a hysterectomy.  She has had a bladder suspension procedure and possibly a cystocele or prolapse repair.  She denies history of kidney stones and urinary tract infections.  Bowel movements normal  Modifying factors: There are no other modifying factors  Associated signs and symptoms: There are no other associated signs and symptoms Aggravating and relieving factors: There are no other aggravating or relieving factors Severity: Moderate Duration: Persistent   PMH: Past Medical History:  Diagnosis Date  . Arthritis    back, knees, fingers  . Bipolar disorder ([place])   . Bunion, left   . Depression   . Diabetes mellitus    diet controlled.   . Hypertension   . Lower back pain    S/P fall  . Squamous cell skin cancer, face    [facility]  . Treadmill stress test negative for angina pectoris June 2013    Surgical History: Past Surgical History:  Procedure Laterality Date  . BLADDER AUGMENTATION    . BLADDER SURGERY    . CARDIAC CATHETERIZATION  03/05/12   no stents  . CATARACT EXTRACTION W/PHACO Right 07/19/2015   Procedure:  CATARACT EXTRACTION PHACO AND INTRAOCULAR LENS PLACEMENT ([place]) right eye;  Surgeon: Ronnell Freshwater, MD;  Location: [facility];  Service: Ophthalmology;  Laterality: Right;  BORDERLINE DIABETIC - oral meds  . CATARACT EXTRACTION W/PHACO Left 05/28/2017   Procedure: CATARACT EXTRACTION PHACO AND INTRAOCULAR LENS PLACEMENT ([place]) LEFT DIABETIC;  Surgeon: Leandrew Koyanagi, MD;  Location: [facility];  Service: Ophthalmology;  Laterality: Left;  Diabetic - oal meds  . JOINT REPLACEMENT  2012   bilateral hip  . KNEE ARTHROSCOPY    . TOTAL HIP ARTHROPLASTY  2012  . TOTAL KNEE ARTHROPLASTY Right 06/04/2017   Procedure: TOTAL KNEE ARTHROPLASTY;  Surgeon: Lovell Sheehan, MD;  Location: [facility];  Service: Orthopedics;  Laterality: Right;  . TUBAL LIGATION    . VAGINAL DELIVERY     2    Home Medications:  Allergies as of 02/03/2019      Reactions   Thimerosal Itching      Medication List       Accurate as of February 03, 2019  9:35 AM. If you have any questions, ask your nurse or doctor.        acetaminophen 500 MG tablet Commonly known as: TYLENOL Take 1,000 mg by mouth every 6 (six) hours as needed (FOR PAIN.).   ALPRAZolam 0.5 MG tablet Commonly known as: XANAX TAKE 1 TABLET BY MOUTH EVERY DAY  AT BEDTIME AS NEEDED FOR INSOMNIA   Biotin 5000 5 MG Caps Generic drug: Biotin Take 2 capsules by mouth 2 (two) times daily.   blood glucose meter kit and supplies Dispense Contour next bayer meter. E11.9 To check blood glucose  Once a day.   cyanocobalamin 1000 MCG/ML injection Commonly known as: (VITAMIN B-12) INJECT 1ML INTO THE MUSCLE ONCE A WEEK FOR 3 WEEKS THEN MONTHLY   diazepam 5 MG tablet Commonly known as: Valium Take 1 tablet (5 mg total) by mouth every 8 (eight) hours as needed for muscle spasms.   DULoxetine 30 MG capsule Commonly known as: CYMBALTA TAKE 1 CAPSULE BY MOUTH EVERY DAY   freestyle lancets Use once daily to check blood sugars   E11.9   glucose blood test strip Commonly known as: FREESTYLE LITE Use once daily  as instructed to test blood sugar E 11.9   ibuprofen 600 MG tablet Commonly known as: ADVIL Take 1 tablet (600 mg total) by mouth every 8 (eight) hours as needed.   lamoTRIgine 200 MG tablet Commonly known as: LAMICTAL TAKE 1 TABLET BY MOUTH TWICE A DAY   losartan 50 MG tablet Commonly known as: COZAAR TAKE 1 TABLET BY MOUTH EVERY DAY   losartan 25 MG tablet Commonly known as: COZAAR TAKE TWO TABLETS BY MOUTH ONCE DAILY   metFORMIN 850 MG tablet Commonly known as: GLUCOPHAGE TAKE 1 TABLET BY MOUTH TWICE DAILY WITH A MEAL   omeprazole 40 MG capsule Commonly known as: PRILOSEC TAKE 1 CAPSULE BY MOUTH EVERY DAY   Syringe (Disposable) 1 ML Misc For use with B12 injections   traZODone 50 MG tablet Commonly known as: DESYREL TAKE 1/2-1 TAB BY MOUTH AT BEDTIME   Vitamin D3 25 MCG (1000 UT) Caps Take 1,000 Units by mouth daily.   vitamin E 1000 UNIT capsule Take 1,000 Units by mouth daily.       Allergies:  Allergies  Allergen Reactions  . Thimerosal Itching    Family History: Family History  Problem Relation Age of Onset  . Heart disease Mother   . Stroke Mother   . Heart disease Father   . Dementia Father   . Heart disease Maternal Grandmother   . Heart disease Maternal Grandfather   . Heart disease Paternal Grandmother   . Heart disease Paternal Grandfather   . Breast cancer Maternal Aunt     Social History:  reports that she has never smoked. She has never used smokeless tobacco. She reports current alcohol use of about 1.0 standard drinks of alcohol per week. She reports that she does not use drugs.  ROS: UROLOGY Frequent Urination?: No Hard to postpone urination?: No Burning/pain with urination?: No Get up at night to urinate?: No Leakage of urine?: Yes Urine stream starts and stops?: No Trouble starting stream?: No Do you have to strain to urinate?: No Blood  in urine?: No Urinary tract infection?: No Sexually transmitted disease?: No Injury to kidneys or bladder?: No Painful intercourse?: No Weak stream?: No Currently pregnant?: No Vaginal bleeding?: No Last menstrual period?: n  Gastrointestinal Nausea?: No Vomiting?: No Indigestion/heartburn?: Yes Diarrhea?: No Constipation?: No  Constitutional Fever: No Night sweats?: No Weight loss?: No Fatigue?: No  Skin Skin rash/lesions?: No Itching?: No  Eyes Blurred vision?: No Double vision?: No  Ears/Nose/Throat Sore throat?: No Sinus problems?: No  Hematologic/Lymphatic Swollen glands?: No Easy bruising?: No  Cardiovascular Leg swelling?: No Chest pain?: No  Respiratory Cough?: No Shortness of breath?: No  Endocrine  Excessive thirst?: No  Musculoskeletal Back pain?: Yes Joint pain?: Yes  Neurological Headaches?: No Dizziness?: No  Psychologic Depression?: Yes Anxiety?: Yes  Physical Exam: BP (!) 146/81   Pulse (!) 116   Ht 5' (1.524 m)   Wt 165 lb (74.8 kg)   BMI 32.22 kg/m   Constitutional:  Alert and oriented, No acute distress. HEENT: [place] AT, moist mucus membranes.  Trachea midline, no masses. Cardiovascular: No clubbing, cyanosis, or edema. Respiratory: Normal respiratory effort, no increased work of breathing. GI: Abdomen is soft, nontender, nondistended, no abdominal masses GU: Patient had a grade 1 cystocele and possibly mild shortening of the urethra.  She had grade 2 hypermobility the bladder neck and no stress incontinence.  She had a distal grade 1 rectocele.  Her tissues were not that well estrogenized Skin: No rashes, bruises or suspicious lesions. Lymph: No cervical or inguinal adenopathy. Neurologic: Grossly intact, no focal deficits, moving all 4 extremities. Psychiatric: Normal mood and affect.  Laboratory Data: Lab Results  Component Value Date   WBC 8.4 08/19/2018   HGB 12.1 08/19/2018   HCT 37.5 08/19/2018   MCV 90.4  08/19/2018   PLT 287 08/19/2018    Lab Results  Component Value Date   CREATININE 0.70 11/16/2018    No results found for: PSA  No results found for: TESTOSTERONE  Lab Results  Component Value Date   HGBA1C 6.6 (H) 07/08/2018    Urinalysis    Component Value Date/Time   COLORURINE STRAW (A) 08/19/2018 0208   APPEARANCEUR CLEAR (A) 08/19/2018 0208   LABSPEC 1.008 08/19/2018 0208   PHURINE 6.0 08/19/2018 0208   GLUCOSEU NEGATIVE 08/19/2018 0208   HGBUR NEGATIVE 08/19/2018 0208   BILIRUBINUR NEGATIVE 08/19/2018 0208   BILIRUBINUR negative 05/04/2017 1143   KETONESUR NEGATIVE 08/19/2018 0208   PROTEINUR NEGATIVE 08/19/2018 0208   UROBILINOGEN 0.2 05/04/2017 1143   NITRITE NEGATIVE 08/19/2018 0208   LEUKOCYTESUR TRACE (A) 08/19/2018 0208    Pertinent Imaging:   Assessment & Plan: Patient has mixed incontinence.  She has had previous bladder surgery.  The role of urodynamics and cystoscopy discussed.  We will look for the size of her cystocele as well during urodynamics.  She does feel something near the rectum and likely feels a small distal rectocele.  I will reevaluate the prolapse once again when she comes back to go over the urodynamics and I will perform cystoscopy then.  She is concerned about her incontinence and would like to have help recognizing is been a chronic problem.  She does not want to live with the problem.  It does appear she may have had urodynamics in [facility] before as well.  She is a retired Marine scientist  1. Urinary incontinence, unspecified type  - Urinalysis, Complete   No follow-ups on file.  Reece Packer, MD  [facility] [address] [place], [place] [address] [phone]

## 2019-02-04 LAB — URINALYSIS, COMPLETE
Bilirubin, UA: NEGATIVE
Glucose, UA: NEGATIVE
Ketones, UA: NEGATIVE
Leukocytes,UA: NEGATIVE
Nitrite, UA: NEGATIVE
Protein,UA: NEGATIVE
RBC, UA: NEGATIVE
Specific Gravity, UA: >1.03 — ABNORMAL HIGH (ref 1.005–1.030)
Urobilinogen, Ur: 0.2 mg/dL (ref 0.2–1.0)
pH, UA: 5.5 (ref 5.0–7.5)

## 2019-02-04 LAB — MICROSCOPIC EXAMINATION: Bacteria, UA: NONE SEEN

## 2019-02-09 ENCOUNTER — Ambulatory Visit: Payer: Medicare HMO

## 2019-02-09 ENCOUNTER — Other Ambulatory Visit: Payer: Medicare HMO

## 2019-02-12 ENCOUNTER — Ambulatory Visit
Admission: RE | Admit: 2019-02-12 | Discharge: 2019-02-12 | Disposition: A | Discharge status: Home / Self Care | Payer: Medicare HMO | Source: Ambulatory Visit | Attending: Pain Medicine | Admitting: Pain Medicine

## 2019-02-12 ENCOUNTER — Other Ambulatory Visit: Payer: Self-pay

## 2019-02-12 DIAGNOSIS — M5416 Radiculopathy, lumbar region: Secondary | ICD-10-CM

## 2019-02-12 DIAGNOSIS — M5124 Other intervertebral disc displacement, thoracic region: Secondary | ICD-10-CM | POA: Diagnosis not present

## 2019-02-12 DIAGNOSIS — M48061 Spinal stenosis, lumbar region without neurogenic claudication: Secondary | ICD-10-CM | POA: Diagnosis not present

## 2019-02-12 DIAGNOSIS — M545 Low back pain, unspecified: Secondary | ICD-10-CM

## 2019-02-13 DIAGNOSIS — R69 Illness, unspecified: Secondary | ICD-10-CM | POA: Diagnosis not present

## 2019-02-19 DIAGNOSIS — M47816 Spondylosis without myelopathy or radiculopathy, lumbar region: Secondary | ICD-10-CM | POA: Diagnosis not present

## 2019-02-26 ENCOUNTER — Other Ambulatory Visit: Payer: Self-pay | Admitting: Internal Medicine

## 2019-02-26 MED ORDER — DULOXETINE HCL 30 MG PO CPEP: ORAL_CAPSULE | ORAL | 0 refills | Status: DC

## 2019-02-26 NOTE — Telephone Encounter (Signed)
Medication Refill - Medication:  DULoxetine (CYMBALTA) 30 MG capsule    Has the patient contacted their pharmacy? Yes advised to call office. Pt states pcp increased to 2 pills daily   Preferred Pharmacy (with phone number or street name):  [id number] [address], [place] [phone] (Phone) [phone] (Fax)     Agent: Please be advised that RX refills may take up to 3 business days. We ask that you follow-up with your pharmacy.

## 2019-02-26 NOTE — Telephone Encounter (Signed)
Requested medication (s) are due for refill today: yes  Requested medication (s) are on the active medication list: yes  Last refill:  01/27/2019  Future visit scheduled: yes  Notes to clinic:  Has the patient contacted their pharmacy? Yes advised to call office. Pt states pcp increased to 2 pills daily     Requested Prescriptions  Pending Prescriptions Disp Refills   DULoxetine (CYMBALTA) 30 MG capsule      Sig: Take by mouth daily.     Psychiatry: Antidepressants - SNRI Failed - 02/26/2019  8:27 AM      Failed - Last BP in normal range    BP Readings from Last 1 Encounters:  02/03/19 (!) 146/81         Passed - Valid encounter within last 6 months    Recent Outpatient Visits          4 months ago Cognitive changes   [place] Crecencio Mc, MD   7 months ago Other iron deficiency anemia   [facility] [place] Crecencio Mc, MD   1 year ago Pre-operative examination   [facility] Crecencio Mc, MD   1 year ago Essential hypertension   [facility] [place] Crecencio Mc, MD   2 years ago Pure hypercholesterolemia   [place] O[place], MD      Future Appointments            In 7 months O'Brien-Blaney, Denisa L, LPN [place], [place] - Completed PHQ-2 or PHQ-9 in the last 360 days.

## 2019-03-02 ENCOUNTER — Other Ambulatory Visit: Payer: Self-pay | Admitting: Internal Medicine

## 2019-03-02 DIAGNOSIS — Z760 Encounter for issue of repeat prescription: Secondary | ICD-10-CM

## 2019-03-03 NOTE — Telephone Encounter (Signed)
Refilled: 01/06/2019 Last OV: 10/16/2018 Next OV: not scheduled

## 2019-03-06 DIAGNOSIS — G8929 Other chronic pain: Secondary | ICD-10-CM | POA: Diagnosis not present

## 2019-03-06 DIAGNOSIS — K219 Gastro-esophageal reflux disease without esophagitis: Secondary | ICD-10-CM | POA: Diagnosis not present

## 2019-03-06 DIAGNOSIS — F419 Anxiety disorder, unspecified: Secondary | ICD-10-CM | POA: Diagnosis not present

## 2019-03-06 DIAGNOSIS — E119 Type 2 diabetes mellitus without complications: Secondary | ICD-10-CM | POA: Diagnosis not present

## 2019-03-06 DIAGNOSIS — E669 Obesity, unspecified: Secondary | ICD-10-CM | POA: Diagnosis not present

## 2019-03-06 DIAGNOSIS — E785 Hyperlipidemia, unspecified: Secondary | ICD-10-CM | POA: Diagnosis not present

## 2019-03-06 DIAGNOSIS — G4700 Insomnia, unspecified: Secondary | ICD-10-CM | POA: Diagnosis not present

## 2019-03-06 DIAGNOSIS — M1990 Unspecified osteoarthritis, unspecified site: Secondary | ICD-10-CM | POA: Diagnosis not present

## 2019-03-06 DIAGNOSIS — R69 Illness, unspecified: Secondary | ICD-10-CM | POA: Diagnosis not present

## 2019-03-06 DIAGNOSIS — I10 Essential (primary) hypertension: Secondary | ICD-10-CM | POA: Diagnosis not present

## 2019-03-12 ENCOUNTER — Other Ambulatory Visit: Payer: Self-pay | Admitting: Urology

## 2019-03-12 DIAGNOSIS — N3946 Mixed incontinence: Secondary | ICD-10-CM | POA: Diagnosis not present

## 2019-03-17 ENCOUNTER — Telehealth: Payer: Self-pay | Admitting: Urology

## 2019-03-17 ENCOUNTER — Ambulatory Visit: Payer: Medicare HMO | Admitting: Urology

## 2019-03-17 ENCOUNTER — Other Ambulatory Visit: Payer: Self-pay

## 2019-03-17 VITALS — BP 162/82 | HR 87 | Ht 60.0 in | Wt 165.0 lb

## 2019-03-17 DIAGNOSIS — N3946 Mixed incontinence: Secondary | ICD-10-CM | POA: Diagnosis not present

## 2019-03-17 DIAGNOSIS — R32 Unspecified urinary incontinence: Secondary | ICD-10-CM

## 2019-03-17 MED ORDER — SOLIFENACIN SUCCINATE 5 MG PO TABS: 5.0000 mg | ORAL_TABLET | Freq: Every day | ORAL | 11 refills | Status: DC

## 2019-03-17 MED ORDER — TOLTERODINE TARTRATE ER 4 MG PO CP24: 4.0000 mg | ORAL_CAPSULE | Freq: Every day | ORAL | 11 refills | Status: DC

## 2019-03-17 NOTE — Progress Notes (Signed)
03/17/2019 11:50 AM   Katherine Jimenez 1951/03/27 [id number]  Referring provider: Crecencio Mc, MD [place],  [place] [address]  Chief Complaint  Patient presents with  . Cysto    HPI: I was consulted to assess the patient's urinary incontinence worsening over many years.  She leaks with coughing sneezing bending lifting.  Sometimes she has urge incontinence and describes foot on the floor syndrome.  She wears 2 or 3 pads a day that are damp and changes tissue as well.  No bedwetting  It appears she likely did not fill the Myrbetriq prescription due to cost.  She was switched to another medication over the phone and it failed   She may have had a TIA years ago.  She is not had a hysterectomy.  She has had a bladder suspension procedure and possibly a cystocele or prolapse repair.  Patient had a grade 1 cystocele and possibly mild shortening of the urethra.  She had grade 2 hypermobility the bladder neck and no stress incontinence.  She had a distal grade 1 rectocele.    Patient has mixed incontinence.  She has had previous bladder surgery.  The role of urodynamics and cystoscopy discussed.  We will look for the size of her cystocele as well during urodynamics.  She does feel something near the rectum and likely feels a small distal rectocele.    She is concerned about her incontinence and would like to have help recognizing is been a chronic problem.  She does not want to live with the problem.  It does appear she may have had urodynamics in [facility] before as well.  She is a retired Marine scientist  Day Frequency stable.  Incontinence stable. On urodynamics she emptied efficiently.  Maximum bladder capacity was 460 mL.  Bladder was unstable reaching a pressure of 5 cmH2O felt as urgency but she did not leak.  She had no stress incontinence with a Valsalva pressure 113 cm water.  During voluntary voiding she voided 400 mL with a maximum flow of 5 mils per  second.  There was a bit of an artifact with her missing the flow meter.  Maximum voiding pressure was 23 cm water.  She emptied efficiently.  EMG activity was noted during bladder emptying.  Bladder neck descended 2 cm.   she did have trouble to void in the lab again.  She had minimal cystocele.The details of the urodynamics are signed and dictated  Patient thinks her nighttime foot on the floor syndrome is worse recently.  She may have a little bit discomfort after urination.  On pelvic examination she had a reasonably good urethral length mild atrophy and a small grade 1 cystocele with no hinge effect.  She had a very small distal rectocele that did not enlarge with Valsalva.  She did have mild stress incontinence prior to cystoscopy.  Modifying factors: There are no other modifying factors  Associated signs and symptoms: There are no other associated signs and symptoms Aggravating and relieving factors: There are no other aggravating or relieving factors Severity: Moderate Duration: Persistent  Cystoscopy: Patient underwent flexible cystoscopy utilizing sterile technique.  Bladder mucosa and trigone were normal.  No cystitis.  No carcinoma.  No foreign body.  The urine may be a little bit cloudy and urine sent for culture  PMH: Past Medical History:  Diagnosis Date  . Arthritis    back, knees, fingers  . Bipolar disorder ([place])   . Bunion, left   .  Depression   . Diabetes mellitus    diet controlled.   . Hypertension   . Lower back pain    S/P fall  . Squamous cell skin cancer, face    [facility]  . Treadmill stress test negative for angina pectoris June 2013    Surgical History: Past Surgical History:  Procedure Laterality Date  . BLADDER AUGMENTATION    . BLADDER SURGERY    . CARDIAC CATHETERIZATION  03/05/12   no stents  . CATARACT EXTRACTION W/PHACO Right 07/19/2015   Procedure: CATARACT EXTRACTION PHACO AND INTRAOCULAR LENS PLACEMENT ([place]) right eye;  Surgeon: Ronnell Freshwater, MD;  Location: [facility];  Service: Ophthalmology;  Laterality: Right;  BORDERLINE DIABETIC - oral meds  . CATARACT EXTRACTION W/PHACO Left 05/28/2017   Procedure: CATARACT EXTRACTION PHACO AND INTRAOCULAR LENS PLACEMENT ([place]) LEFT DIABETIC;  Surgeon: Leandrew Koyanagi, MD;  Location: [facility];  Service: Ophthalmology;  Laterality: Left;  Diabetic - oal meds  . JOINT REPLACEMENT  2012   bilateral hip  . KNEE ARTHROSCOPY    . TOTAL HIP ARTHROPLASTY  2012  . TOTAL KNEE ARTHROPLASTY Right 06/04/2017   Procedure: TOTAL KNEE ARTHROPLASTY;  Surgeon: Lovell Sheehan, MD;  Location: [facility];  Service: Orthopedics;  Laterality: Right;  . TUBAL LIGATION    . VAGINAL DELIVERY     2    Home Medications:  Allergies as of 03/17/2019      Reactions   Thimerosal Itching      Medication List       Accurate as of March 17, 2019 11:50 AM. If you have any questions, ask your nurse or doctor.        acetaminophen 500 MG tablet Commonly known as: TYLENOL Take 1,000 mg by mouth every 6 (six) hours as needed (FOR PAIN.).   ALPRAZolam 0.5 MG tablet Commonly known as: XANAX TAKE 1 TABLET BY MOUTH AT BEDTIME AS NEEDED FOR INSOMNIA   Biotin 5000 5 MG Caps Generic drug: Biotin Take 2 capsules by mouth 2 (two) times daily.   blood glucose meter kit and supplies Dispense Contour next bayer meter. E11.9 To check blood glucose  Once a day.   celecoxib 200 MG capsule Commonly known as: CELEBREX celecoxib 200 mg capsule   cyanocobalamin 1000 MCG/ML injection Commonly known as: (VITAMIN B-12) INJECT 1ML INTO THE MUSCLE ONCE A WEEK FOR 3 WEEKS THEN MONTHLY   diazepam 5 MG tablet Commonly known as: Valium Take 1 tablet (5 mg total) by mouth every 8 (eight) hours as needed for muscle spasms.   DULoxetine 30 MG capsule Commonly known as: CYMBALTA TAKE 1 CAPSULE BY MOUTH EVERY DAY   freestyle lancets Use once daily to check blood sugars  E11.9     glucose blood test strip Commonly known as: FREESTYLE LITE Use once daily  as instructed to test blood sugar E 11.9   ibuprofen 600 MG tablet Commonly known as: ADVIL Take 1 tablet (600 mg total) by mouth every 8 (eight) hours as needed.   lamoTRIgine 200 MG tablet Commonly known as: LAMICTAL TAKE 1 TABLET BY MOUTH TWICE A DAY   losartan 50 MG tablet Commonly known as: COZAAR TAKE 1 TABLET BY MOUTH EVERY DAY   losartan 25 MG tablet Commonly known as: COZAAR TAKE TWO TABLETS BY MOUTH ONCE DAILY   metFORMIN 850 MG tablet Commonly known as: GLUCOPHAGE TAKE 1 TABLET BY MOUTH TWICE DAILY WITH A MEAL   omeprazole 40 MG capsule Commonly known  as: PRILOSEC TAKE 1 CAPSULE BY MOUTH EVERY DAY   Syringe (Disposable) 1 ML Misc For use with B12 injections   tiZANidine 2 MG tablet Commonly known as: ZANAFLEX tizanidine 2 mg tablet  TAKE 1 TABLET BY MOUTH TWICE A DAY   traZODone 50 MG tablet Commonly known as: DESYREL TAKE 1/2-1 TAB BY MOUTH AT BEDTIME   Vitamin D3 25 MCG (1000 UT) Caps Take 1,000 Units by mouth daily.   vitamin E 1000 UNIT capsule Take 1,000 Units by mouth daily.       Allergies:  Allergies  Allergen Reactions  . Thimerosal Itching    Family History: Family History  Problem Relation Age of Onset  . Heart disease Mother   . Stroke Mother   . Heart disease Father   . Dementia Father   . Heart disease Maternal Grandmother   . Heart disease Maternal Grandfather   . Heart disease Paternal Grandmother   . Heart disease Paternal Grandfather   . Breast cancer Maternal Aunt     Social History:  reports that she has never smoked. She has never used smokeless tobacco. She reports current alcohol use of about 1.0 standard drinks of alcohol per week. She reports that she does not use drugs.  ROS:                                        Physical Exam: BP (!) 162/82   Pulse 87   Ht 5' (1.524 m)   Wt 165 lb (74.8 kg)   BMI  32.22 kg/m   Constitutional:  Alert and oriented, No acute distress.  Laboratory Data: Lab Results  Component Value Date   WBC 8.4 08/19/2018   HGB 12.1 08/19/2018   HCT 37.5 08/19/2018   MCV 90.4 08/19/2018   PLT 287 08/19/2018    Lab Results  Component Value Date   CREATININE 0.70 11/16/2018    No results found for: PSA  No results found for: TESTOSTERONE  Lab Results  Component Value Date   HGBA1C 6.6 (H) 07/08/2018    Urinalysis    Component Value Date/Time   COLORURINE STRAW (A) 08/19/2018 0208   APPEARANCEUR [place] (A) 02/03/2019 0903   LABSPEC 1.008 08/19/2018 0208   PHURINE 6.0 08/19/2018 0208   GLUCOSEU Negative 02/03/2019 0903   HGBUR NEGATIVE 08/19/2018 0208   BILIRUBINUR Negative 02/03/2019 0903   KETONESUR NEGATIVE 08/19/2018 0208   PROTEINUR Negative 02/03/2019 0903   PROTEINUR NEGATIVE 08/19/2018 0208   UROBILINOGEN 0.2 05/04/2017 1143   NITRITE Negative 02/03/2019 0903   NITRITE NEGATIVE 08/19/2018 0208   LEUKOCYTESUR Negative 02/03/2019 0903   LEUKOCYTESUR TRACE (A) 08/19/2018 0208    Pertinent Imaging:   Assessment & Plan: I thought it was reasonable to treat the patient with Vesicare 5 mg of 30 tabs 11 refills Detrol 4 mg 3x11.  I would reassess in about 8 weeks.  She does have mixed incontinence has had previous surgery.  Repeating the sling is an option but urethral bulking agent would be a very good 1.  I do not believe she needs a rectocele repair  By history she has failed oxybutynin and is never tried Myrbetriq because it is $300.  Again I think a bulking agent may be a very good consideration  There are no diagnoses linked to this encounter.  No follow-ups on file.  Reece Packer, MD  [facility] [address], [place], [place] [address] [phone]

## 2019-03-17 NOTE — Telephone Encounter (Signed)
Pt did not wish to schedule appt at check out, states she will call to make appt if she isnt' doing better.   FYI

## 2019-03-18 LAB — URINALYSIS, COMPLETE
Bilirubin, UA: NEGATIVE
Glucose, UA: NEGATIVE
Leukocytes,UA: NEGATIVE
Nitrite, UA: NEGATIVE
Protein,UA: NEGATIVE
RBC, UA: NEGATIVE
Specific Gravity, UA: 1.02 (ref 1.005–1.030)
Urobilinogen, Ur: 1 mg/dL (ref 0.2–1.0)
pH, UA: 7.5 (ref 5.0–7.5)

## 2019-03-18 LAB — MICROSCOPIC EXAMINATION
Bacteria, UA: NONE SEEN
RBC, Urine: NONE SEEN /hpf (ref 0–2)

## 2019-03-19 LAB — CULTURE, URINE COMPREHENSIVE

## 2019-04-24 ENCOUNTER — Other Ambulatory Visit: Payer: Medicare HMO

## 2019-04-24 ENCOUNTER — Other Ambulatory Visit: Payer: Self-pay | Admitting: Internal Medicine

## 2019-06-07 ENCOUNTER — Other Ambulatory Visit: Payer: Self-pay | Admitting: Internal Medicine

## 2019-06-07 DIAGNOSIS — Z760 Encounter for issue of repeat prescription: Secondary | ICD-10-CM

## 2019-06-09 ENCOUNTER — Other Ambulatory Visit: Payer: Self-pay | Admitting: Internal Medicine

## 2019-07-01 DIAGNOSIS — H43393 Other vitreous opacities, bilateral: Secondary | ICD-10-CM | POA: Diagnosis not present

## 2019-07-01 LAB — HM DIABETES EYE EXAM

## 2019-07-11 ENCOUNTER — Telehealth: Payer: Self-pay | Admitting: Internal Medicine

## 2019-07-11 NOTE — Telephone Encounter (Signed)
Patient needs an appointment .  I will see her on Tuesday  At  12:30

## 2019-07-11 NOTE — Telephone Encounter (Signed)
Pt states that her depression has gotten worse due to family issues and would like her dose increased to twice a day. Please advise.

## 2019-07-11 NOTE — Telephone Encounter (Signed)
LMTCB

## 2019-07-11 NOTE — Telephone Encounter (Signed)
Pt returned your call.  

## 2019-07-14 NOTE — Telephone Encounter (Signed)
Spoke with pt and she has been scheduled for 12:30 virtual visit tomorrow.

## 2019-07-15 ENCOUNTER — Telehealth (INDEPENDENT_AMBULATORY_CARE_PROVIDER_SITE_OTHER): Payer: Medicare HMO | Admitting: Internal Medicine

## 2019-07-15 ENCOUNTER — Encounter: Payer: Self-pay | Admitting: Internal Medicine

## 2019-07-15 VITALS — Ht 60.0 in | Wt 165.0 lb

## 2019-07-15 DIAGNOSIS — E538 Deficiency of other specified B group vitamins: Secondary | ICD-10-CM | POA: Diagnosis not present

## 2019-07-15 DIAGNOSIS — R69 Illness, unspecified: Secondary | ICD-10-CM | POA: Diagnosis not present

## 2019-07-15 DIAGNOSIS — Z1231 Encounter for screening mammogram for malignant neoplasm of breast: Secondary | ICD-10-CM | POA: Diagnosis not present

## 2019-07-15 DIAGNOSIS — E782 Mixed hyperlipidemia: Secondary | ICD-10-CM | POA: Diagnosis not present

## 2019-07-15 DIAGNOSIS — E119 Type 2 diabetes mellitus without complications: Secondary | ICD-10-CM

## 2019-07-15 DIAGNOSIS — E559 Vitamin D deficiency, unspecified: Secondary | ICD-10-CM

## 2019-07-15 DIAGNOSIS — F3131 Bipolar disorder, current episode depressed, mild: Secondary | ICD-10-CM

## 2019-07-15 MED ORDER — BUPROPION HCL ER (XL) 150 MG PO TB24: 150.0000 mg | ORAL_TABLET | Freq: Every day | ORAL | 1 refills | Status: DC

## 2019-07-15 NOTE — Progress Notes (Signed)
Virtual Visit via [place]  This visit type was conducted due to national recommendations for restrictions regarding the COVID-19 pandemic (e.g. social distancing).  This format is felt to be most appropriate for this patient at this time.  All issues noted in this document were discussed and addressed.  No physical exam was performed (except for noted visual exam findings with Video Visits).   I connected with@ on 07/15/19 at 12:30 PM EDT by a video enabled telemedicine application  and verified that I am speaking with the correct person using two identifiers. Location patient: home Location provider: work or home office Persons participating in the virtual visit: patient, provider  I discussed the limitations, risks, security and privacy concerns of performing an evaluation and management service by telephone and the availability of in person appointments. I also discussed with the patient that there may be a patient responsible charge related to this service. The patient expressed understanding and agreed to proceed.   Reason for visit: worsening depression  HPI:  69 yr old female with chronic pain, bipolar disorder , depression recently aggravated by family illness. Daughter and children infected with COVID 68 .  All infections have been mild,  But impact on family significant due to children needing quarantining.  She has no positive relationship with daughter,  But is housing one of her granddaughters who is asymptomatic.  Patient has been vaccinated against COVID 19 and has been maintaining unmasked relationship  With granddaughter .  There has been friction,  Granddaughter is willful and disrespectful  Pain:   Taking tylenol pm   Forcing self to go to the gym 4 -5 days per week.  Feels better during workout and afterward.    Insomnia/bipolar disorder :  No manic behavior. She is taking  Xanax and lamictal at night,  cymbalta in the morning.  No motivation,  But disciplined with regard  to exercising.  Some Overeating.  Denies suicidaility   ROS: See pertinent positives and negatives per HPI.  Past Medical History:  Diagnosis Date  . Arthritis    back, knees, fingers  . Bipolar disorder ([place])   . Bunion, left   . Depression   . Diabetes mellitus    diet controlled.   . Hypertension   . Lower back pain    S/P fall  . Squamous cell skin cancer, face    [facility]  . Treadmill stress test negative for angina pectoris June 2013    Past Surgical History:  Procedure Laterality Date  . BLADDER AUGMENTATION    . BLADDER SURGERY    . CARDIAC CATHETERIZATION  03/05/12   no stents  . CATARACT EXTRACTION W/PHACO Right 07/19/2015   Procedure: CATARACT EXTRACTION PHACO AND INTRAOCULAR LENS PLACEMENT ([place]) right eye;  Surgeon: Ronnell Freshwater, MD;  Location: [place];  Service: Ophthalmology;  Laterality: Right;  BORDERLINE DIABETIC - oral meds  . CATARACT EXTRACTION W/PHACO Left 05/28/2017   Procedure: CATARACT EXTRACTION PHACO AND INTRAOCULAR LENS PLACEMENT ([place]) LEFT DIABETIC;  Surgeon: Leandrew Koyanagi, MD;  Location: [place];  Service: Ophthalmology;  Laterality: Left;  Diabetic - oal meds  . JOINT REPLACEMENT  2012   bilateral hip  . KNEE ARTHROSCOPY    . TOTAL HIP ARTHROPLASTY  2012  . TOTAL KNEE ARTHROPLASTY Right 06/04/2017   Procedure: TOTAL KNEE ARTHROPLASTY;  Surgeon: Lovell Sheehan, MD;  Location: [facility];  Service: Orthopedics;  Laterality: Right;  . TUBAL LIGATION    . VAGINAL DELIVERY  2    Family History  Problem Relation Age of Onset  . Heart disease Mother   . Stroke Mother   . Heart disease Father   . Dementia Father   . Heart disease Maternal Grandmother   . Heart disease Maternal Grandfather   . Heart disease Paternal Grandmother   . Heart disease Paternal Grandfather   . Breast cancer Maternal Aunt     SOCIAL HX:  reports that she has never smoked. She has never used smokeless tobacco. She reports  current alcohol use of about 1.0 standard drinks of alcohol per week. She reports that she does not use drugs.   Current Outpatient Medications:  .  acetaminophen (TYLENOL) 500 MG tablet, Take 1,000 mg by mouth every 6 (six) hours as needed (FOR PAIN.)., Disp: , Rfl:  .  ALPRAZolam (XANAX) 0.5 MG tablet, TAKE 1 TABLET BY MOUTH EVERY DAY AT BEDTIME AS NEEDED FOR INSOMNIA, Disp: 30 tablet, Rfl: 2 .  atorvastatin (LIPITOR) 10 MG tablet, Take 10 mg by mouth daily., Disp: , Rfl:  .  Biotin (BIOTIN 5000) 5 MG CAPS, Take 2 capsules by mouth 2 (two) times daily. , Disp: , Rfl:  .  blood glucose meter kit and supplies, Dispense Contour next bayer meter. E11.9 To check blood glucose  Once a day., Disp: 1 each, Rfl: 0 .  celecoxib (CELEBREX) 200 MG capsule, celecoxib 200 mg capsule, Disp: , Rfl:  .  Cholecalciferol (VITAMIN D3) 1000 units CAPS, Take 1,000 Units by mouth daily. , Disp: , Rfl:  .  cyanocobalamin (,VITAMIN B-12,) 1000 MCG/ML injection, INJECT 1ML INTO THE MUSCLE ONCE A WEEK FOR 3 WEEKS THEN MONTHLY, Disp: 5 mL, Rfl: 8 .  diphenhydramine-acetaminophen (TYLENOL PM) 25-500 MG TABS tablet, Take 1 tablet by mouth at bedtime as needed., Disp: , Rfl:  .  DULoxetine (CYMBALTA) 30 MG capsule, TAKE 1 CAPSULE BY MOUTH EVERY DAY, Disp: 90 capsule, Rfl: 0 .  glucose blood (FREESTYLE LITE) test strip, Use once daily  as instructed to test blood sugar E 11.9, Disp: 100 each, Rfl: 3 .  lamoTRIgine (LAMICTAL) 200 MG tablet, TAKE 1 TABLET BY MOUTH TWICE A DAY, Disp: 180 tablet, Rfl: 1 .  Lancets (FREESTYLE) lancets, Use once daily to check blood sugars  E11.9, Disp: 100 each, Rfl: 3 .  losartan (COZAAR) 25 MG tablet, TAKE TWO TABLETS BY MOUTH ONCE DAILY, Disp: 180 tablet, Rfl: 1 .  metFORMIN (GLUCOPHAGE) 850 MG tablet, TAKE 1 TABLET BY MOUTH TWICE DAILY WITH A MEAL (Patient taking differently: Take by mouth daily with breakfast. TAKE 1/2 TABLET BY MOUTH TWICE DAILY WITH A MEAL), Disp: 180 tablet, Rfl: 1 .   omeprazole (PRILOSEC) 40 MG capsule, TAKE 1 CAPSULE BY MOUTH EVERY DAY, Disp: 90 capsule, Rfl: 3 .  Syringe, Disposable, 1 ML MISC, For use with B12 injections, Disp: 25 each, Rfl: 3 .  vitamin E 1000 UNIT capsule, Take 1,000 Units by mouth daily., Disp: , Rfl:  .  buPROPion (WELLBUTRIN XL) 150 MG 24 hr tablet, Take 1 tablet (150 mg total) by mouth daily., Disp: 90 tablet, Rfl: 1 .  diazepam (VALIUM) 5 MG tablet, Take 1 tablet (5 mg total) by mouth every 8 (eight) hours as needed for muscle spasms. (Patient not taking: Reported on 07/15/2019), Disp: 20 tablet, Rfl: 0 .  tiZANidine (ZANAFLEX) 2 MG tablet, tizanidine 2 mg tablet  TAKE 1 TABLET BY MOUTH TWICE A DAY, Disp: , Rfl:   EXAM:  VITALS per patient if  applicable:  GENERAL: alert, oriented, appears well and in no acute distress  HEENT: atraumatic, conjunttiva clear, no obvious abnormalities on inspection of external nose and ears  NECK: normal movements of the head and neck  LUNGS: on inspection no signs of respiratory distress, breathing rate appears normal, no obvious gross SOB, gasping or wheezing  CV: no obvious cyanosis  MS: moves all visible extremities without noticeable abnormality  PSYCH/NEURO: pleasant and cooperative, no obvious depression or anxiety, speech and thought processing grossly intact  ASSESSMENT AND PLAN:  Discussed the following assessment and plan:  Encounter for screening mammogram for breast cancer - Plan: MM 3D SCREEN BREAST BILATERAL  B12 deficiency - Plan: Vitamin B12  Mixed hyperlipidemia - Plan: Lipid panel, Comprehensive metabolic panel  Diabetes mellitus without complication ([place]) - Plan: Hemoglobin A1c, Microalbumin / creatinine urine ratio  Vitamin D deficiency - Plan: VITAMIN D 25 Hydroxy (Vit-D Deficiency, Fractures)  Bipolar affective disorder, currently depressed, mild (HCC)  Bipolar disorder Managed with lamictal prescribed initially by her psychiatrist.  Trial of wellbutrin 19m  XL     I discussed the assessment and treatment plan with the patient. The patient was provided an opportunity to ask questions and all were answered. The patient agreed with the plan and demonstrated an understanding of the instructions.   The patient was advised to call back or seek an in-person evaluation if the symptoms worsen or if the condition fails to improve as anticipated.  I provided  30 minutes of non-face-to-face time during this encounter reviewing patient's current problems and past surgeries, labs and imaging studies, providing counseling on the above mentioned problems , and coordination  of care . TCrecencio Mc MD

## 2019-07-15 NOTE — Assessment & Plan Note (Addendum)
Managed with lamictal prescribed initially by her psychiatrist.  Trial of wellbutrin 150mg  XL

## 2019-07-15 NOTE — Patient Instructions (Signed)
Trial of Wellbutrin XL  150 mg.  Continue cymbalta and lamictal

## 2019-07-21 DIAGNOSIS — R4189 Other symptoms and signs involving cognitive functions and awareness: Secondary | ICD-10-CM | POA: Diagnosis not present

## 2019-07-24 DIAGNOSIS — F419 Anxiety disorder, unspecified: Secondary | ICD-10-CM | POA: Diagnosis not present

## 2019-07-24 DIAGNOSIS — R32 Unspecified urinary incontinence: Secondary | ICD-10-CM | POA: Diagnosis not present

## 2019-07-24 DIAGNOSIS — K219 Gastro-esophageal reflux disease without esophagitis: Secondary | ICD-10-CM | POA: Diagnosis not present

## 2019-07-24 DIAGNOSIS — E669 Obesity, unspecified: Secondary | ICD-10-CM | POA: Diagnosis not present

## 2019-07-24 DIAGNOSIS — M1990 Unspecified osteoarthritis, unspecified site: Secondary | ICD-10-CM | POA: Diagnosis not present

## 2019-07-24 DIAGNOSIS — G8929 Other chronic pain: Secondary | ICD-10-CM | POA: Diagnosis not present

## 2019-07-24 DIAGNOSIS — E119 Type 2 diabetes mellitus without complications: Secondary | ICD-10-CM | POA: Diagnosis not present

## 2019-07-24 DIAGNOSIS — R69 Illness, unspecified: Secondary | ICD-10-CM | POA: Diagnosis not present

## 2019-07-24 DIAGNOSIS — E785 Hyperlipidemia, unspecified: Secondary | ICD-10-CM | POA: Diagnosis not present

## 2019-07-24 DIAGNOSIS — I10 Essential (primary) hypertension: Secondary | ICD-10-CM | POA: Diagnosis not present

## 2019-07-29 ENCOUNTER — Other Ambulatory Visit: Payer: Self-pay

## 2019-07-30 ENCOUNTER — Other Ambulatory Visit: Payer: Self-pay | Admitting: *Deleted

## 2019-07-30 ENCOUNTER — Telehealth: Payer: Self-pay | Admitting: Internal Medicine

## 2019-07-30 NOTE — Telephone Encounter (Signed)
Patient was told by her pharmacy, [facility] to contact her provider about her omeprazole (PRILOSEC) 40 MG capsule. [facility] would not tell patient why?

## 2019-07-30 NOTE — Patient Outreach (Signed)
[facility]) Care Management  07/30/2019  MAKYNLEIGH HOUX [birth year] [id number]   Telephone Screen  Referral Date:  07/29/2019 Referral Source:  EMMI Prevent Reason for Referral:  Screening/Join EMMI Insurance:  [place] [employer]   Outreach Attempt:  Outreach attempt #1 to patient for telephone screening. No answer. RN Health Coach left HIPAA compliant voicemail message along with contact information.  Plan:  RN Health Coach will send unsuccessful outreach letter to patient.  RN Health Coach will make another outreach attempt to patient within 3-4 business days if no return call back from patient.   [place] [phone] [email or web address]

## 2019-07-31 ENCOUNTER — Other Ambulatory Visit: Payer: Self-pay | Admitting: *Deleted

## 2019-07-31 DIAGNOSIS — R4189 Other symptoms and signs involving cognitive functions and awareness: Secondary | ICD-10-CM | POA: Diagnosis not present

## 2019-07-31 NOTE — Patient Outreach (Signed)
[facility]) Care Management  07/31/2019  Katherine Jimenez 27-Jul-1950 [id number]   Telephone Screen  Referral Date:  07/29/2019 Referral Source:  EMMI Prevent Reason for Referral:  Screening/Join EMMI Insurance:  [place] [employer]   Outreach Attempt:  Outreach attempt #2 to patient for screening. No answer. RN Health Coach left HIPAA compliant voicemail message along with contact information.  Plan:  RN Health Coach will make another outreach attempt to patient within 3-4 business days if no return call back from patient.  [place] [phone] [email or web address]

## 2019-07-31 NOTE — Telephone Encounter (Signed)
Spoke with pt to let her know that [facility] spoke with [facility] and they have her rx ready for pick up.

## 2019-08-01 ENCOUNTER — Encounter: Payer: Self-pay | Admitting: Internal Medicine

## 2019-08-01 ENCOUNTER — Other Ambulatory Visit: Payer: Self-pay

## 2019-08-01 ENCOUNTER — Ambulatory Visit (INDEPENDENT_AMBULATORY_CARE_PROVIDER_SITE_OTHER): Payer: Medicare HMO | Admitting: Internal Medicine

## 2019-08-01 ENCOUNTER — Ambulatory Visit
Admission: RE | Admit: 2019-08-01 | Discharge: 2019-08-01 | Disposition: A | Discharge status: Home / Self Care | Payer: Medicare HMO | Source: Ambulatory Visit | Attending: Internal Medicine | Admitting: Internal Medicine

## 2019-08-01 ENCOUNTER — Other Ambulatory Visit: Payer: Self-pay | Admitting: *Deleted

## 2019-08-01 VITALS — BP 148/88 | HR 104 | Temp 97.5°F | Resp 16 | Ht 60.0 in | Wt 173.4 lb

## 2019-08-01 DIAGNOSIS — E119 Type 2 diabetes mellitus without complications: Secondary | ICD-10-CM

## 2019-08-01 DIAGNOSIS — R69 Illness, unspecified: Secondary | ICD-10-CM | POA: Diagnosis not present

## 2019-08-01 DIAGNOSIS — M89319 Hypertrophy of bone, unspecified shoulder: Secondary | ICD-10-CM

## 2019-08-01 DIAGNOSIS — F3131 Bipolar disorder, current episode depressed, mild: Secondary | ICD-10-CM

## 2019-08-01 DIAGNOSIS — M25511 Pain in right shoulder: Secondary | ICD-10-CM | POA: Diagnosis not present

## 2019-08-01 NOTE — Progress Notes (Signed)
Subjective:  Patient ID: Katherine Jimenez, female    DOB: 07/15/1950  Age: 69 y.o. MRN: [id number]  CC: The primary encounter diagnosis was Clavicular enlargement. Diagnoses of Bipolar affective disorder, currently depressed, mild ([place]) and Diabetes mellitus without complication ([place]) were also pertinent to this visit.  HPI Katherine Jimenez presents for PAINLESS RIGHT CLAVICULAR SWELLING   Patient has received both doses of the [place] 19 vaccine without complications.  Last dose over 6 weeks ago.   Patient continues to mask when outside of the home except when walking in yard or at safe distances from others .  Patient denies any change in mood or development of unhealthy behaviors resuting from the pandemic's restriction of activities and socialization.    Clavicular swelling,  First noticed it 2 weeks ago on visual inspection.  Not painful , red or warm.  She has No history of trauma or gout.  Has severe thoracic scoliosis by prior films  No swallow problems, fevers,  Wt loss,  Dyspnea    Depression:  Aggravated by Family conflict,  78 yr old grandchild is  now addicted to vaping , daughter is addicted to drugs and son in law in prison .  Sympotms: fatigue.   Anhedonia, trouble concentrating   Outpatient Medications Prior to Visit  Medication Sig Dispense Refill  . acetaminophen (TYLENOL) 500 MG tablet Take 1,000 mg by mouth every 6 (six) hours as needed (FOR PAIN.).    Marland Kitchen ALPRAZolam (XANAX) 0.5 MG tablet TAKE 1 TABLET BY MOUTH EVERY DAY AT BEDTIME AS NEEDED FOR INSOMNIA 30 tablet 2  . atorvastatin (LIPITOR) 10 MG tablet Take 10 mg by mouth daily.    . Biotin (BIOTIN 5000) 5 MG CAPS Take 2 capsules by mouth 2 (two) times daily.     . blood glucose meter kit and supplies Dispense Contour next bayer meter. E11.9 To check blood glucose  Once a day. 1 each 0  . buPROPion (WELLBUTRIN XL) 150 MG 24 hr tablet Take 1 tablet (150 mg total) by mouth daily. 90 tablet 1  . celecoxib (CELEBREX)  200 MG capsule celecoxib 200 mg capsule    . Cholecalciferol (VITAMIN D3) 1000 units CAPS Take 1,000 Units by mouth daily.     . cyanocobalamin (,VITAMIN B-12,) 1000 MCG/ML injection INJECT 1ML INTO THE MUSCLE ONCE A WEEK FOR 3 WEEKS THEN MONTHLY 5 mL 8  . diphenhydramine-acetaminophen (TYLENOL PM) 25-500 MG TABS tablet Take 1 tablet by mouth at bedtime as needed.    . DULoxetine (CYMBALTA) 30 MG capsule TAKE 1 CAPSULE BY MOUTH EVERY DAY 90 capsule 0  . glucose blood (FREESTYLE LITE) test strip Use once daily  as instructed to test blood sugar E 11.9 100 each 3  . lamoTRIgine (LAMICTAL) 200 MG tablet TAKE 1 TABLET BY MOUTH TWICE A DAY 180 tablet 1  . Lancets (FREESTYLE) lancets Use once daily to check blood sugars  E11.9 100 each 3  . losartan (COZAAR) 25 MG tablet TAKE TWO TABLETS BY MOUTH ONCE DAILY 180 tablet 1  . metFORMIN (GLUCOPHAGE) 850 MG tablet TAKE 1 TABLET BY MOUTH TWICE DAILY WITH A MEAL (Patient taking differently: Take by mouth daily with breakfast. TAKE 1/2 TABLET BY MOUTH TWICE DAILY WITH A MEAL) 180 tablet 1  . omeprazole (PRILOSEC) 40 MG capsule TAKE 1 CAPSULE BY MOUTH EVERY DAY 90 capsule 3  . Syringe, Disposable, 1 ML MISC For use with B12 injections 25 each 3  . tiZANidine (ZANAFLEX) 2  MG tablet tizanidine 2 mg tablet  TAKE 1 TABLET BY MOUTH TWICE A DAY    . vitamin E 1000 UNIT capsule Take 1,000 Units by mouth daily.    . diazepam (VALIUM) 5 MG tablet Take 1 tablet (5 mg total) by mouth every 8 (eight) hours as needed for muscle spasms. (Patient not taking: Reported on 08/01/2019) 20 tablet 0   No facility-administered medications prior to visit.    Review of Systems;  Patient denies headache, fevers, malaise, unintentional weight loss, skin rash, eye pain, sinus congestion and sinus pain, sore throat, dysphagia,  hemoptysis , cough, dyspnea, wheezing, chest pain, palpitations, orthopnea, edema, abdominal pain, nausea, melena, diarrhea, constipation, flank pain, dysuria,  hematuria, urinary  Frequency, nocturia, numbness, tingling, seizures,  Focal weakness, Loss of consciousness,  Tremor, insomnia, anxiety, and suicidal ideation.      Objective:  BP (!) 148/88 (BP Location: Left Arm, Patient Position: Sitting, Cuff Size: Normal)   Pulse (!) 104   Temp (!) 97.5 F (36.4 C) (Temporal)   Resp 16   Ht 5' (1.524 m)   Wt 173 lb 6.4 oz (78.7 kg)   SpO2 95%   BMI 33.86 kg/m   BP Readings from Last 3 Encounters:  08/01/19 (!) 148/88  03/17/19 (!) 162/82  02/03/19 (!) 146/81    Wt Readings from Last 3 Encounters:  08/01/19 173 lb 6.4 oz (78.7 kg)  07/15/19 165 lb (74.8 kg)  03/17/19 165 lb (74.8 kg)    General appearance: alert, cooperative and appears stated age Ears: normal TM's and external ear canals both ears Throat: lips, mucosa, and tongue normal; teeth and gums normal Neck: no adenopathy, no carotid bruit, supple, symmetrical, trachea midline and thyroid not enlarged, symmetric, no tenderness/mass/nodules Back: symmetric, no curvature. ROM normal. No CVA tenderness. Lungs: clear to auscultation bilaterally Heart: regular rate and rhythm, S1, S2 normal, no murmur, click, rub or gallop Abdomen: soft, non-tender; bowel sounds normal; no masses,  no organomegaly Pulses: 2+ and symmetric Skin: Skin color, texture, turgor normal. No rashes or lesions Lymph nodes: Cervical, supraclavicular, and axillary nodes normal. Psych: affect normal, makes good eye contact. No fidgeting,  Smiles easily.  Denies suicidal thoughts   Lab Results  Component Value Date   HGBA1C 6.6 (H) 07/08/2018   HGBA1C 6.4 05/04/2017   HGBA1C 6.0 01/17/2017    Lab Results  Component Value Date   CREATININE 0.70 11/16/2018   CREATININE 0.71 08/19/2018   CREATININE 0.82 07/08/2018    Lab Results  Component Value Date   WBC 8.4 08/19/2018   HGB 12.1 08/19/2018   HCT 37.5 08/19/2018   PLT 287 08/19/2018   GLUCOSE 144 (H) 08/19/2018   CHOL 284 (H) 07/08/2018   TRIG  126.0 07/08/2018   HDL 106.40 07/08/2018   LDLDIRECT 112.0 10/13/2016   LDLCALC 152 (H) 07/08/2018   ALT 17 08/19/2018   AST 17 08/19/2018   NA 136 08/19/2018   K 3.7 08/19/2018   CL 101 08/19/2018   CREATININE 0.70 11/16/2018   BUN 15 08/19/2018   CO2 26 08/19/2018   TSH 2.05 07/08/2018   INR 1.05 05/23/2017   HGBA1C 6.6 (H) 07/08/2018   MICROALBUR <0.7 07/08/2018    MR THORACIC SPINE WO CONTRAST  Result Date: 02/12/2019 CLINICAL DATA:  Chronic back pain. EXAM: MRI THORACIC AND LUMBAR SPINE WITHOUT CONTRAST TECHNIQUE: Multiplanar and multiecho pulse sequences of the thoracic and lumbar spine were obtained without intravenous contrast. COMPARISON:  Lumbar radiographs 02/26/2016 FINDINGS: MRI THORACIC  SPINE FINDINGS Alignment: Significant right convex thoracic scoliosis estimated at 40 degrees. Normal overall alignment in the sagittal plane. Vertebrae: Normal marrow signal. No bone lesions or fractures. A few small scattered hemangiomas are noted. Cord:  Normal cord signal intensity.  No cord lesions or syrinx. Paraspinal and other soft tissues: No significant paraspinal findings. No worrisome posterior lung lesions and no pleural effusion. Disc levels: Moderate multilevel disc disease and facet disease. Shallow right paracentral disc osteophyte complex with slight flattening the ventral thecal sac. No foraminal stenosis. Focal central disc protrusion at T4-5 with mass effect on the ventral thecal sac and narrowing the ventral CSF space. Shallow left paracentral disc osteophyte complexes at T7-8, T8-9 and T9-10 with mild flattening of the ventral thecal sac. MRI LUMBAR SPINE FINDINGS Segmentation: There are five lumbar type vertebral bodies. The last full intervertebral disc space is labeled L5-S1. Alignment:  Normal Vertebrae:  Normal marrow signal.  No bone lesions or fractures. Conus medullaris and cauda equina: Conus extends to the L1 level. Conus and cauda equina appear normal. Paraspinal  and other soft tissues: No significant findings. Disc levels: T12-L1: Bulging annulus and osteophytic ridging with mild flattening of the ventral thecal sac, asymmetric right with right lateral recess stenosis. L1-2: Right paracentral and right foraminal disc osteophyte complex with mass effect on the right side of the thecal sac contributing to right lateral recess stenosis and mild right foraminal stenosis possibly affecting both the right L1 and L2 nerve roots. L2-3: Bulging degenerated annulus, osteophytic ridging and asymmetric right-sided facet disease contributing to moderate right foraminal stenosis and moderate right lateral recess stenosis possibly affecting both the right L2 and L3 nerve roots. There is also mild/early spinal stenosis. No left foraminal stenosis. L3-4: Bulging degenerated annulus, osteophytic ridging and facet disease contributing to moderate left lateral recess stenosis and mild left foraminal stenosis. L4-5: Shallow right foraminal disc protrusion contributing to mild right foraminal stenosis possibly irritating the right L4 nerve root. No significant spinal or lateral recess stenosis. L5-S1: Left-sided disc osteophyte complex with mild encroachment on the exiting left L5 nerve root. Severe facet disease at this level but no significant spinal or lateral recess stenosis. IMPRESSION: MR THORACIC SPINE IMPRESSION 1. Significant scoliosis and degenerative thoracic spondylosis. 2. Shallow disc osteophyte complexes at T7-8, T8-9 and T9-10 but no significant spinal or foraminal stenosis. 3. Central disc protrusion at T4-5. MR LUMBAR SPINE IMPRESSION 1. Multilevel disc disease and facet disease as detailed above. 2. Mild multilevel spinal, lateral recess and foraminal stenosis as specifically detailed above at the individual levels. Electronically Signed   By: Marijo Sanes M.D.   On: 02/12/2019 09:10   MR LUMBAR SPINE WO CONTRAST  Result Date: 02/12/2019 CLINICAL DATA:  Chronic back  pain. EXAM: MRI THORACIC AND LUMBAR SPINE WITHOUT CONTRAST TECHNIQUE: Multiplanar and multiecho pulse sequences of the thoracic and lumbar spine were obtained without intravenous contrast. COMPARISON:  Lumbar radiographs 02/26/2016 FINDINGS: MRI THORACIC SPINE FINDINGS Alignment: Significant right convex thoracic scoliosis estimated at 40 degrees. Normal overall alignment in the sagittal plane. Vertebrae: Normal marrow signal. No bone lesions or fractures. A few small scattered hemangiomas are noted. Cord:  Normal cord signal intensity.  No cord lesions or syrinx. Paraspinal and other soft tissues: No significant paraspinal findings. No worrisome posterior lung lesions and no pleural effusion. Disc levels: Moderate multilevel disc disease and facet disease. Shallow right paracentral disc osteophyte complex with slight flattening the ventral thecal sac. No foraminal stenosis. Focal central disc protrusion at T4-5  with mass effect on the ventral thecal sac and narrowing the ventral CSF space. Shallow left paracentral disc osteophyte complexes at T7-8, T8-9 and T9-10 with mild flattening of the ventral thecal sac. MRI LUMBAR SPINE FINDINGS Segmentation: There are five lumbar type vertebral bodies. The last full intervertebral disc space is labeled L5-S1. Alignment:  Normal Vertebrae:  Normal marrow signal.  No bone lesions or fractures. Conus medullaris and cauda equina: Conus extends to the L1 level. Conus and cauda equina appear normal. Paraspinal and other soft tissues: No significant findings. Disc levels: T12-L1: Bulging annulus and osteophytic ridging with mild flattening of the ventral thecal sac, asymmetric right with right lateral recess stenosis. L1-2: Right paracentral and right foraminal disc osteophyte complex with mass effect on the right side of the thecal sac contributing to right lateral recess stenosis and mild right foraminal stenosis possibly affecting both the right L1 and L2 nerve roots. L2-3:  Bulging degenerated annulus, osteophytic ridging and asymmetric right-sided facet disease contributing to moderate right foraminal stenosis and moderate right lateral recess stenosis possibly affecting both the right L2 and L3 nerve roots. There is also mild/early spinal stenosis. No left foraminal stenosis. L3-4: Bulging degenerated annulus, osteophytic ridging and facet disease contributing to moderate left lateral recess stenosis and mild left foraminal stenosis. L4-5: Shallow right foraminal disc protrusion contributing to mild right foraminal stenosis possibly irritating the right L4 nerve root. No significant spinal or lateral recess stenosis. L5-S1: Left-sided disc osteophyte complex with mild encroachment on the exiting left L5 nerve root. Severe facet disease at this level but no significant spinal or lateral recess stenosis. IMPRESSION: MR THORACIC SPINE IMPRESSION 1. Significant scoliosis and degenerative thoracic spondylosis. 2. Shallow disc osteophyte complexes at T7-8, T8-9 and T9-10 but no significant spinal or foraminal stenosis. 3. Central disc protrusion at T4-5. MR LUMBAR SPINE IMPRESSION 1. Multilevel disc disease and facet disease as detailed above. 2. Mild multilevel spinal, lateral recess and foraminal stenosis as specifically detailed above at the individual levels. Electronically Signed   By: Marijo Sanes M.D.   On: 02/12/2019 09:10    Assessment & Plan:   Problem List Items Addressed This Visit      Unprioritized   Bipolar disorder ([place])    She is tolerating wellbutrin started several weeks ago and is requesting an increase in dose due to persistent negative symptoms.  Increase dose to 300 mg daily.  No manic behavior or episodes  Continue lamictal       Clavicular enlargement - Primary    Exam not consistent with active gout or inflammation.  [facility] chest x ray is unrevealing.  CT neck ordered.       Relevant Orders   DG Chest 2 View (Completed)   Uric acid   C-reactive  protein   Diabetes mellitus without complication (HCC)    Historically Excellent control with metformin at dose of  850 mg bid, but she is overdue for labs by 6 moths .  Advised to continue low glycemic index diet and return for fasting labs  .  She is unable to resume resume regular exercise due to back pain and DJD right knee.  No proteinuria  Lab Results  Component Value Date   HGBA1C 6.6 (H) 07/08/2018    Lab Results  Component Value Date   MICROALBUR <0.7 07/08/2018                I am having Katherine Jimenez maintain her glucose blood, freestyle, Syringe (Disposable), blood  glucose meter kit and supplies, Vitamin D3, Biotin, vitamin E, acetaminophen, diazepam, metFORMIN, cyanocobalamin, omeprazole, DULoxetine, tiZANidine, celecoxib, losartan, ALPRAZolam, lamoTRIgine, atorvastatin, diphenhydramine-acetaminophen, and buPROPion.  No orders of the defined types were placed in this encounter.   There are no discontinued medications.  Follow-up: No follow-ups on file.   Crecencio Mc, MD

## 2019-08-01 NOTE — Patient Outreach (Signed)
[facility]) Care Management  08/01/2019  Katherine Jimenez [birth year] [id number]   Telephone Screen  Referral Date:07/29/2019 Referral Source:EMMI Prevent Reason for Referral:Screening/Join EMMI Insurance:[employer] Medicare   Outreach Attempt:  Outreach attempt #3 to patient for telephone screening. No answer. RN Health Coach left HIPAA compliant voicemail message along with contact information.  Plan:  RN Health Coach will close case if no return call from patient within 10 day time period from Unsuccessful Letter being mailed to patient.  [place] [phone] [email or web address]

## 2019-08-01 NOTE — Patient Instructions (Signed)
I have ordered  A chest x ray to be done at [facility] to evaluate the clavicular enlargement on the right    You may increase the wellbutrin to 300 mg daily using the capsules you have.  Let me know in 2 weeks if you think it has helped and I will refill at higher dose

## 2019-08-02 ENCOUNTER — Other Ambulatory Visit: Payer: Self-pay | Admitting: Internal Medicine

## 2019-08-02 DIAGNOSIS — M89319 Hypertrophy of bone, unspecified shoulder: Secondary | ICD-10-CM

## 2019-08-03 ENCOUNTER — Telehealth: Payer: Self-pay | Admitting: Internal Medicine

## 2019-08-03 DIAGNOSIS — M89319 Hypertrophy of bone, unspecified shoulder: Secondary | ICD-10-CM | POA: Insufficient documentation

## 2019-08-03 NOTE — Assessment & Plan Note (Signed)
Exam not consistent with active gout or inflammation.  [facility] chest x ray is unrevealing.  CT neck ordered.

## 2019-08-03 NOTE — Telephone Encounter (Signed)
Needs labs appt asap  MyChart message sent

## 2019-08-03 NOTE — Assessment & Plan Note (Signed)
Historically Excellent control with metformin at dose of  850 mg bid, but she is overdue for labs by 6 moths .  Advised to continue low glycemic index diet and return for fasting labs  .  She is unable to resume resume regular exercise due to back pain and DJD right knee.  No proteinuria  Lab Results  Component Value Date   HGBA1C 6.6 (H) 07/08/2018    Lab Results  Component Value Date   MICROALBUR <0.7 07/08/2018

## 2019-08-03 NOTE — Assessment & Plan Note (Signed)
She is tolerating wellbutrin started several weeks ago and is requesting an increase in dose due to persistent negative symptoms.  Increase dose to 300 mg daily.  No manic behavior or episodes  Continue lamictal

## 2019-08-06 ENCOUNTER — Telehealth: Payer: Self-pay | Admitting: Internal Medicine

## 2019-08-06 MED ORDER — BUPROPION HCL ER (XL) 300 MG PO TB24
300.0000 mg | ORAL_TABLET | Freq: Every day | ORAL | 5 refills | Status: DC
Start: 2019-08-06 — End: 2019-08-13

## 2019-08-06 NOTE — Telephone Encounter (Signed)
Rx for wellbutrin xl 300 mg daily sent to pharmacy

## 2019-08-06 NOTE — Telephone Encounter (Signed)
Pt states that [facility] on [address] told her that the increased dose of buPROPion (WELLBUTRIN XL) 150 MG 24 hr tablet will not be covered unless approved by PCP first. Twice a day. Please advise. Pt is almost out of medication.

## 2019-08-12 ENCOUNTER — Other Ambulatory Visit: Payer: Self-pay | Admitting: *Deleted

## 2019-08-12 NOTE — Patient Outreach (Signed)
[facility]) Care Management  08/12/2019  [name] [birth year] [id number]   Telephone Screen/Case Closure  Referral Date:07/29/2019 Referral Source:EMMI Prevent Reason for Referral:Screening/Join EMMI Insurance:[employer] Medicare   Outreach Attempt:  Multiple attempts to establish contact with patient without success. No response from letter mailed to patient. Case is being closed at this time.   Plan:  RN Health Coach will close case based on inability to establish contact with patient.   [place] [phone] [email or web address]

## 2019-08-13 ENCOUNTER — Telehealth: Payer: Self-pay | Admitting: Internal Medicine

## 2019-08-13 MED ORDER — BUPROPION HCL ER (XL) 150 MG PO TB24
150.0000 mg | ORAL_TABLET | Freq: Every day | ORAL | 2 refills | Status: DC
Start: 2019-08-13 — End: 2020-07-28

## 2019-08-13 NOTE — Telephone Encounter (Signed)
Spoke with pt and informed her of the medication change and that the [facility] has [facility]. Also advised pt that if the dizziness did not resolve or becomes accompanied by heart rate greater than 100, chest pain, or SOBr then she will need to go to the ED. Pt gave a verbal understanding.

## 2019-08-13 NOTE — Telephone Encounter (Signed)
Dose of wellbutrin has been reduced to 150 mg and [facility].  If dizziness is not resolved in a few days , or is accompanied by heart rate > 100,  Chest pain or shortness of breath,  Needs to go to ER

## 2019-08-13 NOTE — Telephone Encounter (Signed)
Pt called in and said that the increased dose of Wellbutrin is making her worse. She said she is losing her balance and seem more depressed. She would like a call back today.

## 2019-08-15 ENCOUNTER — Other Ambulatory Visit: Payer: Self-pay

## 2019-08-15 ENCOUNTER — Other Ambulatory Visit (INDEPENDENT_AMBULATORY_CARE_PROVIDER_SITE_OTHER): Payer: Medicare HMO

## 2019-08-15 DIAGNOSIS — E559 Vitamin D deficiency, unspecified: Secondary | ICD-10-CM | POA: Diagnosis not present

## 2019-08-15 DIAGNOSIS — E782 Mixed hyperlipidemia: Secondary | ICD-10-CM

## 2019-08-15 DIAGNOSIS — E538 Deficiency of other specified B group vitamins: Secondary | ICD-10-CM

## 2019-08-15 DIAGNOSIS — M89319 Hypertrophy of bone, unspecified shoulder: Secondary | ICD-10-CM

## 2019-08-15 DIAGNOSIS — E119 Type 2 diabetes mellitus without complications: Secondary | ICD-10-CM | POA: Diagnosis not present

## 2019-08-15 LAB — VITAMIN B12: Vitamin B-12: 305 pg/mL (ref 211–911)

## 2019-08-15 LAB — COMPREHENSIVE METABOLIC PANEL
ALT: 18 U/L (ref 0–35)
AST: 15 U/L (ref 0–37)
Albumin: 4.5 g/dL (ref 3.5–5.2)
Alkaline Phosphatase: 100 U/L (ref 39–117)
BUN: 11 mg/dL (ref 6–23)
CO2: 28 mEq/L (ref 19–32)
Calcium: 9.4 mg/dL (ref 8.4–10.5)
Chloride: 98 mEq/L (ref 96–112)
Creatinine, Ser: 0.79 mg/dL (ref 0.40–1.20)
GFR: 72.48 mL/min (ref >60.00)
Glucose, Bld: 164 mg/dL — ABNORMAL HIGH (ref 70–99)
Potassium: 4.3 mEq/L (ref 3.5–5.1)
Sodium: 134 mEq/L — ABNORMAL LOW (ref 135–145)
Total Bilirubin: 0.4 mg/dL (ref 0.2–1.2)
Total Protein: 7.2 g/dL (ref 6.0–8.3)

## 2019-08-15 LAB — LIPID PANEL
Cholesterol: 211 mg/dL — ABNORMAL HIGH (ref 0–200)
HDL: 96.9 mg/dL (ref >39.00)
LDL Cholesterol: 94 mg/dL (ref 0–99)
NonHDL: 114.43
Total CHOL/HDL Ratio: 2
Triglycerides: 102 mg/dL (ref 0.0–149.0)
VLDL: 20.4 mg/dL (ref 0.0–40.0)

## 2019-08-15 LAB — VITAMIN D 25 HYDROXY (VIT D DEFICIENCY, FRACTURES): VITD: 43.82 ng/mL (ref 30.00–100.00)

## 2019-08-15 LAB — C-REACTIVE PROTEIN: CRP: <1 mg/dL (ref 0.5–20.0)

## 2019-08-15 LAB — MICROALBUMIN / CREATININE URINE RATIO
Creatinine,U: 207.5 mg/dL
Microalb Creat Ratio: 0.9 mg/g (ref 0.0–30.0)
Microalb, Ur: 1.9 mg/dL (ref 0.0–1.9)

## 2019-08-15 LAB — HEMOGLOBIN A1C: Hgb A1c MFr Bld: 6.7 % — ABNORMAL HIGH (ref 4.6–6.5)

## 2019-08-15 LAB — URIC ACID: Uric Acid, Serum: 4.2 mg/dL (ref 2.4–7.0)

## 2019-08-20 DIAGNOSIS — R419 Unspecified symptoms and signs involving cognitive functions and awareness: Secondary | ICD-10-CM | POA: Diagnosis not present

## 2019-08-20 DIAGNOSIS — R4189 Other symptoms and signs involving cognitive functions and awareness: Secondary | ICD-10-CM | POA: Diagnosis not present

## 2019-08-22 ENCOUNTER — Other Ambulatory Visit: Payer: Self-pay

## 2019-08-22 ENCOUNTER — Ambulatory Visit
Admission: RE | Admit: 2019-08-22 | Discharge: 2019-08-22 | Disposition: A | Discharge status: Home / Self Care | Payer: Medicare HMO | Source: Ambulatory Visit | Attending: Internal Medicine | Admitting: Internal Medicine

## 2019-08-22 DIAGNOSIS — R221 Localized swelling, mass and lump, neck: Secondary | ICD-10-CM | POA: Diagnosis not present

## 2019-08-22 DIAGNOSIS — M89319 Hypertrophy of bone, unspecified shoulder: Secondary | ICD-10-CM | POA: Diagnosis not present

## 2019-08-22 MED ORDER — IOHEXOL 300 MG/ML  SOLN
75.0000 mL | Freq: Once | INTRAMUSCULAR | Status: AC | PRN
  Administered 2019-08-22: 75 mL via INTRAVENOUS

## 2019-08-26 DIAGNOSIS — M2012 Hallux valgus (acquired), left foot: Secondary | ICD-10-CM | POA: Diagnosis not present

## 2019-09-02 ENCOUNTER — Other Ambulatory Visit: Payer: Self-pay | Admitting: Internal Medicine

## 2019-09-02 DIAGNOSIS — Z760 Encounter for issue of repeat prescription: Secondary | ICD-10-CM

## 2019-09-03 ENCOUNTER — Telehealth: Payer: Self-pay | Admitting: Internal Medicine

## 2019-09-03 NOTE — Telephone Encounter (Signed)
Patient called and would like to speak to Katherine Jimenez. Patient would not state why she was calling but to say it is personal.

## 2019-09-03 NOTE — Telephone Encounter (Signed)
Refill request for xanax, last seen 08-01-19, last filled 06-07-19.  Please advise.

## 2019-09-05 NOTE — Telephone Encounter (Signed)
Pt stated that she saw Dr. Manuella Ghazi and he sent her down to [facility] for some neurological testing. The results came back showing unspecified cognitive impairment and atypical alzheimer's. Follows up with Dr. Manuella Ghazi on July 1st.

## 2019-09-05 NOTE — Telephone Encounter (Signed)
Pt called and asked you to return her call at your convenience.

## 2019-09-08 ENCOUNTER — Other Ambulatory Visit: Payer: Self-pay | Admitting: Internal Medicine

## 2019-09-08 NOTE — Telephone Encounter (Signed)
Pt called wanting a return call

## 2019-09-09 DIAGNOSIS — R69 Illness, unspecified: Secondary | ICD-10-CM | POA: Diagnosis not present

## 2019-09-09 DIAGNOSIS — M722 Plantar fascial fibromatosis: Secondary | ICD-10-CM | POA: Diagnosis not present

## 2019-09-09 DIAGNOSIS — M79671 Pain in right foot: Secondary | ICD-10-CM | POA: Diagnosis not present

## 2019-09-10 NOTE — Telephone Encounter (Signed)
Pt would like a call back today

## 2019-09-12 DIAGNOSIS — E559 Vitamin D deficiency, unspecified: Secondary | ICD-10-CM | POA: Diagnosis not present

## 2019-09-12 DIAGNOSIS — G939 Disorder of brain, unspecified: Secondary | ICD-10-CM | POA: Diagnosis not present

## 2019-09-12 DIAGNOSIS — R69 Illness, unspecified: Secondary | ICD-10-CM | POA: Diagnosis not present

## 2019-09-12 DIAGNOSIS — E538 Deficiency of other specified B group vitamins: Secondary | ICD-10-CM | POA: Diagnosis not present

## 2019-09-12 DIAGNOSIS — R4189 Other symptoms and signs involving cognitive functions and awareness: Secondary | ICD-10-CM | POA: Diagnosis not present

## 2019-09-26 ENCOUNTER — Other Ambulatory Visit: Payer: Self-pay | Admitting: Neurology

## 2019-09-26 ENCOUNTER — Other Ambulatory Visit (HOSPITAL_COMMUNITY): Payer: Self-pay | Admitting: Neurology

## 2019-09-26 DIAGNOSIS — R4189 Other symptoms and signs involving cognitive functions and awareness: Secondary | ICD-10-CM

## 2019-09-30 DIAGNOSIS — M2012 Hallux valgus (acquired), left foot: Secondary | ICD-10-CM | POA: Diagnosis not present

## 2019-09-30 DIAGNOSIS — M722 Plantar fascial fibromatosis: Secondary | ICD-10-CM | POA: Diagnosis not present

## 2019-09-30 DIAGNOSIS — M79671 Pain in right foot: Secondary | ICD-10-CM | POA: Diagnosis not present

## 2019-10-01 ENCOUNTER — Telehealth: Payer: Self-pay | Admitting: Internal Medicine

## 2019-10-01 MED ORDER — DULOXETINE HCL 30 MG PO CPEP: ORAL_CAPSULE | ORAL | 0 refills | Status: DC

## 2019-10-01 NOTE — Telephone Encounter (Signed)
Pt needs refill on DULoxetine (CYMBALTA) 30 MG capsule sent to [place]-

## 2019-10-03 ENCOUNTER — Other Ambulatory Visit: Payer: Self-pay

## 2019-10-03 MED ORDER — DULOXETINE HCL 30 MG PO CPEP: ORAL_CAPSULE | ORAL | 0 refills | Status: DC

## 2019-10-15 ENCOUNTER — Ambulatory Visit (HOSPITAL_COMMUNITY): Payer: Medicare HMO

## 2019-10-15 ENCOUNTER — Ambulatory Visit: Payer: Medicare HMO

## 2019-10-25 ENCOUNTER — Other Ambulatory Visit: Payer: Self-pay | Admitting: Internal Medicine

## 2019-11-03 ENCOUNTER — Ambulatory Visit (HOSPITAL_COMMUNITY): Payer: Medicare HMO

## 2019-11-03 ENCOUNTER — Encounter (HOSPITAL_COMMUNITY): Payer: Self-pay

## 2019-11-11 DIAGNOSIS — M5136 Other intervertebral disc degeneration, lumbar region: Secondary | ICD-10-CM | POA: Diagnosis not present

## 2019-11-11 DIAGNOSIS — M5416 Radiculopathy, lumbar region: Secondary | ICD-10-CM | POA: Diagnosis not present

## 2019-11-11 DIAGNOSIS — M545 Low back pain: Secondary | ICD-10-CM | POA: Diagnosis not present

## 2019-11-17 DIAGNOSIS — M5416 Radiculopathy, lumbar region: Secondary | ICD-10-CM | POA: Diagnosis not present

## 2019-11-17 DIAGNOSIS — M5136 Other intervertebral disc degeneration, lumbar region: Secondary | ICD-10-CM | POA: Diagnosis not present

## 2019-11-26 DIAGNOSIS — Z03818 Encounter for observation for suspected exposure to other biological agents ruled out: Secondary | ICD-10-CM | POA: Diagnosis not present

## 2019-11-26 DIAGNOSIS — Z1152 Encounter for screening for COVID-19: Secondary | ICD-10-CM | POA: Diagnosis not present

## 2019-11-27 ENCOUNTER — Other Ambulatory Visit: Payer: Self-pay | Admitting: Internal Medicine

## 2019-11-27 DIAGNOSIS — Z760 Encounter for issue of repeat prescription: Secondary | ICD-10-CM

## 2019-11-27 NOTE — Telephone Encounter (Signed)
Refilled: 09/03/2019 Last OV: 08/01/2019 Next OV: not scheduled

## 2019-12-03 ENCOUNTER — Ambulatory Visit (INDEPENDENT_AMBULATORY_CARE_PROVIDER_SITE_OTHER): Payer: Medicare HMO

## 2019-12-03 VITALS — Ht 61.0 in | Wt 173.0 lb

## 2019-12-03 DIAGNOSIS — Z1231 Encounter for screening mammogram for malignant neoplasm of breast: Secondary | ICD-10-CM

## 2019-12-03 DIAGNOSIS — Z780 Asymptomatic menopausal state: Secondary | ICD-10-CM | POA: Diagnosis not present

## 2019-12-03 DIAGNOSIS — Z0000 Encounter for general adult medical examination without abnormal findings: Secondary | ICD-10-CM | POA: Diagnosis not present

## 2019-12-03 NOTE — Patient Instructions (Addendum)
Ms. Katherine Jimenez , Thank you for taking time to come for your Medicare Wellness Visit. I appreciate your ongoing commitment to your health goals. Please review the following plan we discussed and let me know if I can assist you in the future.   These are the goals we discussed: Goals      Patient Stated   .  I would like to increase water aerobics up to 5 days (pt-stated)       This is a list of the screening recommended for you and due dates:  Health Maintenance  Topic Date Due  .  Hepatitis C: One time screening is recommended by Center for Disease Control  (CDC) for  adults born from [birth year] through 1964.   Never done  . DEXA scan (bone density measurement)  Never done  . Complete foot exam   09/12/2017  . Mammogram  02/08/2019  . Flu Shot  07/01/2020*  . Tetanus Vaccine  12/02/2020*  . Hemoglobin A1C  02/15/2020  . Pneumonia vaccines (2 of 2 - PPSV23) 04/21/2020  . Eye exam for diabetics  06/30/2020  . Colon Cancer Screening  01/17/2026  . COVID-19 Vaccine  Completed  *Topic was postponed. The date shown is not the original due date.    Immunizations Immunization History  Administered Date(s) Administered  . Influenza, High Dose Seasonal PF 11/20/2018  . Influenza-Unspecified 12/02/2013, 12/31/2014, 12/27/2015, 01/03/2017, 11/24/2017  . PFIZER SARS-COV-2 Vaccination 05/26/2019, 06/16/2019  . Pneumococcal Conjugate-13 11/20/2018  . Pneumococcal Polysaccharide-23 04/22/2015  . Zoster Recombinat (Shingrix) 11/20/2018   Advanced directives: none   Conditions/risks identified: none new  Follow up in one year for your annual wellness visit.   Preventive Care 28 Years and Older, Female Preventive care refers to lifestyle choices and visits with your health care provider that can promote health and wellness. What does preventive care include?  A yearly physical exam. This is also called an annual well check.  Dental exams once or twice a year.  Routine eye exams. Ask your  health care provider how often you should have your eyes checked.  Personal lifestyle choices, including:  Daily care of your teeth and gums.  Regular physical activity.  Eating a healthy diet.  Avoiding tobacco and drug use.  Limiting alcohol use.  Practicing safe sex.  Taking low-dose aspirin every day.  Taking vitamin and mineral supplements as recommended by your health care provider. What happens during an annual well check? The services and screenings done by your health care provider during your annual well check will depend on your age, overall health, lifestyle risk factors, and family history of disease. Counseling  Your health care provider may ask you questions about your:  Alcohol use.  Tobacco use.  Drug use.  Emotional well-being.  Home and relationship well-being.  Sexual activity.  Eating habits.  History of falls.  Memory and ability to understand (cognition).  Work and work Statistician.  Reproductive health. Screening  You may have the following tests or measurements:  Height, weight, and BMI.  Blood pressure.  Lipid and cholesterol levels. These may be checked every 5 years, or more frequently if you are over 91 years old.  Skin check.  Lung cancer screening. You may have this screening every year starting at age 37 if you have a 30-pack-year history of smoking and currently smoke or have quit within the past 15 years.  Fecal occult blood test (FOBT) of the stool. You may have this test every year starting at age  50.  Flexible sigmoidoscopy or colonoscopy. You may have a sigmoidoscopy every 5 years or a colonoscopy every 10 years starting at age 47.  Hepatitis C blood test.  Hepatitis B blood test.  Sexually transmitted disease (STD) testing.  Diabetes screening. This is done by checking your blood sugar (glucose) after you have not eaten for a while (fasting). You may have this done every 1-3 years.  Bone density scan. This is  done to screen for osteoporosis. You may have this done starting at age 13.  Mammogram. This may be done every 1-2 years. Talk to your health care provider about how often you should have regular mammograms. Talk with your health care provider about your test results, treatment options, and if necessary, the need for more tests. Vaccines  Your health care provider may recommend certain vaccines, such as:  Influenza vaccine. This is recommended every year.  Tetanus, diphtheria, and acellular pertussis (Tdap, Td) vaccine. You may need a Td booster every 10 years.  Zoster vaccine. You may need this after age 49.  Pneumococcal 13-valent conjugate (PCV13) vaccine. One dose is recommended after age 64.  Pneumococcal polysaccharide (PPSV23) vaccine. One dose is recommended after age 49. Talk to your health care provider about which screenings and vaccines you need and how often you need them. This information is not intended to replace advice given to you by your health care provider. Make sure you discuss any questions you have with your health care provider. Document Released: 04/16/2015 Document Revised: 12/08/2015 Document Reviewed: 01/19/2015 Elsevier Interactive Patient Education  2017 [place] Prevention in the Home Falls can cause injuries. They can happen to people of all ages. There are many things you can do to make your home safe and to help prevent falls. What can I do on the outside of my home?  Regularly fix the edges of walkways and driveways and fix any cracks.  Remove anything that might make you trip as you walk through a door, such as a raised step or threshold.  Trim any bushes or trees on the path to your home.  Use bright outdoor lighting.  Clear any walking paths of anything that might make someone trip, such as rocks or tools.  Regularly check to see if handrails are loose or broken. Make sure that both sides of any steps have handrails.  Any raised  decks and porches should have guardrails on the edges.  Have any leaves, snow, or ice cleared regularly.  Use sand or salt on walking paths during winter.  Clean up any spills in your garage right away. This includes oil or grease spills. What can I do in the bathroom?  Use night lights.  Install grab bars by the toilet and in the tub and shower. Do not use towel bars as grab bars.  Use non-skid mats or decals in the tub or shower.  If you need to sit down in the shower, use a plastic, non-slip stool.  Keep the floor dry. Clean up any water that spills on the floor as soon as it happens.  Remove soap buildup in the tub or shower regularly.  Attach bath mats securely with double-sided non-slip rug tape.  Do not have throw rugs and other things on the floor that can make you trip. What can I do in the bedroom?  Use night lights.  Make sure that you have a light by your bed that is easy to reach.  Do not use any sheets  or blankets that are too big for your bed. They should not hang down onto the floor.  Have a firm chair that has side arms. You can use this for support while you get dressed.  Do not have throw rugs and other things on the floor that can make you trip. What can I do in the kitchen?  Clean up any spills right away.  Avoid walking on wet floors.  Keep items that you use a lot in easy-to-reach places.  If you need to reach something above you, use a strong step stool that has a grab bar.  Keep electrical cords out of the way.  Do not use floor polish or wax that makes floors slippery. If you must use wax, use non-skid floor wax.  Do not have throw rugs and other things on the floor that can make you trip. What can I do with my stairs?  Do not leave any items on the stairs.  Make sure that there are handrails on both sides of the stairs and use them. Fix handrails that are broken or loose. Make sure that handrails are as long as the stairways.  Check  any carpeting to make sure that it is firmly attached to the stairs. Fix any carpet that is loose or worn.  Avoid having throw rugs at the top or bottom of the stairs. If you do have throw rugs, attach them to the floor with carpet tape.  Make sure that you have a light switch at the top of the stairs and the bottom of the stairs. If you do not have them, ask someone to add them for you. What else can I do to help prevent falls?  Wear shoes that:  Do not have high heels.  Have rubber bottoms.  Are comfortable and fit you well.  Are closed at the toe. Do not wear sandals.  If you use a stepladder:  Make sure that it is fully opened. Do not climb a closed stepladder.  Make sure that both sides of the stepladder are locked into place.  Ask someone to hold it for you, if possible.  Clearly mark and make sure that you can see:  Any grab bars or handrails.  First and last steps.  Where the edge of each step is.  Use tools that help you move around (mobility aids) if they are needed. These include:  Canes.  Walkers.  Scooters.  Crutches.  Turn on the lights when you go into a dark area. Replace any light bulbs as soon as they burn out.  Set up your furniture so you have a clear path. Avoid moving your furniture around.  If any of your floors are uneven, fix them.  If there are any pets around you, be aware of where they are.  Review your medicines with your doctor. Some medicines can make you feel dizzy. This can increase your chance of falling. Ask your doctor what other things that you can do to help prevent falls. This information is not intended to replace advice given to you by your health care provider. Make sure you discuss any questions you have with your health care provider. Document Released: 01/14/2009 Document Revised: 08/26/2015 Document Reviewed: 04/24/2014 Elsevier Interactive Patient Education  2017 [employer].

## 2019-12-03 NOTE — Progress Notes (Addendum)
Subjective:   Katherine Jimenez is a 69 y.o. female who presents for Medicare Annual (Subsequent) preventive examination.  Review of Systems    No ROS.  Medicare Wellness Virtual Visit.   Cardiac Risk Factors include: advanced age (>16mn, >>20women);hypertension;diabetes mellitus     Objective:    Today's Vitals   12/03/19 0947  Weight: 173 lb (78.5 kg)  Height: '5\' 1"'  (1.549 m)   Body mass index is 32.69 kg/m.  Advanced Directives 12/03/2019 10/14/2018 08/19/2018 06/04/2017 05/28/2017 05/23/2017 02/26/2016  Does Patient Have a Medical Advance Directive? No No No No No No No  Would patient like information on creating a medical advance directive? No - Patient declined No - Patient declined - Yes (Inpatient - patient requests chaplain consult to create a medical advance directive) No - Patient declined - No - Patient declined    Current Medications (verified) Outpatient Encounter Medications as of 12/03/2019  Medication Sig   acetaminophen (TYLENOL) 500 MG tablet Take 1,000 mg by mouth every 6 (six) hours as needed (FOR PAIN.).   ALPRAZolam (XANAX) 0.5 MG tablet TAKE ONE TABLET BY MOUTH EVERY NIGHT AT BEDTIME AS NEEDED FOR INSOMNIA   atorvastatin (LIPITOR) 10 MG tablet Take 10 mg by mouth daily.   Biotin (BIOTIN 5000) 5 MG CAPS Take 2 capsules by mouth 2 (two) times daily.    blood glucose meter kit and supplies Dispense Contour next bayer meter. E11.9 To check blood glucose  Once a day.   buPROPion (WELLBUTRIN XL) 150 MG 24 hr tablet Take 1 tablet (150 mg total) by mouth daily.   celecoxib (CELEBREX) 200 MG capsule celecoxib 200 mg capsule   Cholecalciferol (VITAMIN D3) 1000 units CAPS Take 1,000 Units by mouth daily.    cyanocobalamin (,VITAMIN B-12,) 1000 MCG/ML injection INJECT 1ML INTO THE MUSCLE ONCE A WEEK FOR 3 WEEKS THEN MONTHLY   diazepam (VALIUM) 5 MG tablet Take 1 tablet (5 mg total) by mouth every 8 (eight) hours as needed for muscle spasms. (Patient not taking: Reported on  08/01/2019)   diphenhydramine-acetaminophen (TYLENOL PM) 25-500 MG TABS tablet Take 1 tablet by mouth at bedtime as needed.   DULoxetine (CYMBALTA) 30 MG capsule TAKE 1 CAPSULE BY MOUTH EVERY DAY   glucose blood (FREESTYLE LITE) test strip Use once daily  as instructed to test blood sugar E 11.9   lamoTRIgine (LAMICTAL) 200 MG tablet TAKE 1 TABLET BY MOUTH TWICE A DAY   Lancets (FREESTYLE) lancets Use once daily to check blood sugars  E11.9   losartan (COZAAR) 25 MG tablet TAKE TWO TABLETS BY MOUTH ONCE DAILY   metFORMIN (GLUCOPHAGE) 850 MG tablet TAKE 1 TABLET BY MOUTH TWICE DAILY WITH A MEAL (Patient taking differently: Take by mouth daily with breakfast. TAKE 1/2 TABLET BY MOUTH TWICE DAILY WITH A MEAL)   omeprazole (PRILOSEC) 40 MG capsule TAKE 1 CAPSULE BY MOUTH EVERY DAY   Syringe, Disposable, 1 ML MISC For use with B12 injections   tiZANidine (ZANAFLEX) 2 MG tablet tizanidine 2 mg tablet  TAKE 1 TABLET BY MOUTH TWICE A DAY   vitamin E 1000 UNIT capsule Take 1,000 Units by mouth daily.   No facility-administered encounter medications on file as of 12/03/2019.    Allergies (verified) Thimerosal   History: Past Medical History:  Diagnosis Date   Arthritis    back, knees, fingers   [place]    Bunion, left    Depression    Diabetes mellitus    diet  controlled.    Hypertension    Lower back pain    S/P fall   Squamous cell skin cancer, face    [facility]   Treadmill stress test negative for angina pectoris June 2013   Past Surgical History:  Procedure Laterality Date   BLADDER AUGMENTATION     BLADDER SURGERY     CARDIAC CATHETERIZATION  03/05/12   no stents   CATARACT EXTRACTION W/PHACO Right 07/19/2015   Procedure: CATARACT EXTRACTION PHACO AND INTRAOCULAR LENS PLACEMENT ([place]) right eye;  Surgeon: Ronnell Freshwater, MD;  Location: [place];  Service: Ophthalmology;  Laterality: Right;  BORDERLINE DIABETIC - oral meds   CATARACT EXTRACTION  W/PHACO Left 05/28/2017   Procedure: CATARACT EXTRACTION PHACO AND INTRAOCULAR LENS PLACEMENT ([place]) LEFT DIABETIC;  Surgeon: Leandrew Koyanagi, MD;  Location: [place];  Service: Ophthalmology;  Laterality: Left;  Diabetic - oal meds   JOINT REPLACEMENT  2012   bilateral hip   KNEE ARTHROSCOPY     TOTAL HIP ARTHROPLASTY  2012   TOTAL KNEE ARTHROPLASTY Right 06/04/2017   Procedure: TOTAL KNEE ARTHROPLASTY;  Surgeon: Lovell Sheehan, MD;  Location: [facility];  Service: Orthopedics;  Laterality: Right;   TUBAL LIGATION     VAGINAL DELIVERY     2   Family History  Problem Relation Age of Onset   Heart disease Mother    Stroke Mother    Heart disease Father    Dementia Father    Heart disease Maternal Grandmother    Heart disease Maternal Grandfather    Heart disease Paternal Grandmother    Heart disease Paternal Grandfather    Breast cancer Maternal Aunt    Social History   Socioeconomic History   Marital status: Married    Spouse name: Not on file   Number of children: Not on file   Years of education: Not on file   Highest education level: Not on file  Occupational History   Not on file  Tobacco Use   Smoking status: Never Smoker   Smokeless tobacco: Never Used  Vaping Use   Vaping Use: Never used  Substance and Sexual Activity   Alcohol use: Yes    Alcohol/week: 1.0 standard drink    Types: 1 Cans of beer per week    Comment: occassionally   Drug use: No   Sexual activity: Not on file  Other Topics Concern   Not on file  Social History Narrative   Lives in [place] with husband.         Social Determinants of Health   Financial Resource Strain: Low Risk    Difficulty of Paying Living Expenses: Not hard at all  Food Insecurity: No Food Insecurity   Worried About Charity fundraiser in the Last Year: Never true   [place] in the Last Year: Never true  Transportation Needs: No Transportation Needs   Lack of Transportation (Medical): No    Lack of Transportation (Non-Medical): No  Physical Activity: Sufficiently Active   Days of Exercise per Week: 3 days   Minutes of Exercise per Session: 50 min  Stress: No Stress Concern Present   Feeling of Stress : Not at all  Social Connections: Unknown   Frequency of Communication with Friends and Family: More than three times a week   Frequency of Social Gatherings with Friends and Family: More than three times a week   Attends Religious Services: Not on file   Active Member of  Clubs or Organizations: Not on file   Attends Archivist Meetings: Not on file   Marital Status: Not on file    Tobacco Counseling Counseling given: Not Answered   Clinical Intake:  Pre-visit preparation completed: Yes        Diabetes: Yes (Followed by pcp)  How often do you need to have someone help you when you read instructions, pamphlets, or other written materials from your doctor or pharmacy?: 1 - Never       Activities of Daily Living In your present state of health, do you have any difficulty performing the following activities: 12/03/2019  Hearing? N  Vision? N  Difficulty concentrating or making decisions? N  Walking or climbing stairs? N  Dressing or bathing? N  Doing errands, shopping? N  Preparing Food and eating ? N  Using the Toilet? N  In the past six months, have you accidently leaked urine? Y  Comment Managed with daily liner/pad  Do you have problems with loss of bowel control? N  Managing your Medications? N  Managing your Finances? N  Housekeeping or managing your Housekeeping? N  Some recent data might be hidden    Patient Care Team: Crecencio Mc, MD as PCP - General (Internal Medicine)  Indicate any recent Medical Services you may have received from other than [facility] providers in the past year (date may be approximate).     Assessment:   This is a routine wellness examination for Katherine Jimenez.  I connected with Katherine Jimenez today by telephone and verified  that I am speaking with the correct person using two identifiers. Location patient: home Location provider: work Persons participating in the virtual visit: patient, Marine scientist.    I discussed the limitations, risks, security and privacy concerns of performing an evaluation and management service by telephone and the availability of in person appointments. The patient expressed understanding and verbally consented to this telephonic visit.    Interactive audio and video telecommunications were attempted between this provider and patient, however failed, due to patient having technical difficulties OR patient did not have access to video capability.  We continued and completed visit with audio only.  Some vital signs may be absent or patient reported.   Hearing/Vision screen  Hearing Screening   '125Hz'  '250Hz'  '500Hz'  '1000Hz'  '2000Hz'  '3000Hz'  '4000Hz'  '6000Hz'  '8000Hz'   Right ear:           Left ear:           Comments: Patient is able to hear conversational tones without difficulty.  No issues reported.   Vision Screening Comments: Followed by [facility] Wears corrective lenses Visual acuity not assessed, virtual visit.  They have seen their ophthalmologist in the last 12 months.     Dietary issues and exercise activities discussed: She tries to have a healthy diet Good water intake Current Exercise Habits: Home exercise routine, Type of exercise: calisthenics (water aerobics), Time (Minutes): 45, Frequency (Times/Week): 3, Weekly Exercise (Minutes/Week): 135, Intensity: Moderate  Goals       Patient Stated     I would like to increase water aerobics up to 5 days (pt-stated)       Depression Screen PHQ 2/9 Scores 12/03/2019 07/15/2019 10/16/2018 10/14/2018 03/10/2016  PHQ - 2 Score 0 '4 2 1 ' 0  PHQ- 9 Score - 9 4 - -    Fall Risk Fall Risk  12/03/2019 08/01/2019 07/15/2019 10/14/2018 03/10/2016  Falls in the past year? 0 '1 1 1 ' Yes  Number falls  in past yr: 0 1 1 0 -  Injury with Fall? - 0 0 0  -  Follow up Falls evaluation completed Falls evaluation completed Falls evaluation completed - -   Handrails in use when climbing stairs? Yes Home free of loose throw rugs in walkways, pet beds, electrical cords, etc? Yes  Adequate lighting in your home to reduce risk of falls? Yes   ASSISTIVE DEVICES UTILIZED TO PREVENT FALLS: Use of a cane, walker or w/c? No   TIMED UP AND GO:  Was the test performed? No . Virtual visit.   Cognitive Function:  Patient is alert and oriented x3.  Denies difficulty focusing, memory loss, making decisions.    6CIT Screen 10/14/2018  What Year? 0 points  What month? 0 points  What time? 0 points  Count back from 20 0 points  Months in reverse 0 points  Repeat phrase 0 points  Total Score 0    Immunizations Immunization History  Administered Date(s) Administered   Influenza, High Dose Seasonal PF 11/20/2018   Influenza-Unspecified 12/02/2013, 12/31/2014, 12/27/2015, 01/03/2017, 11/24/2017   PFIZER SARS-COV-2 Vaccination 05/26/2019, 06/16/2019   Pneumococcal Conjugate-13 11/20/2018   Pneumococcal Polysaccharide-23 04/22/2015   Zoster Recombinat (Shingrix) 11/20/2018    Health Maintenance Health Maintenance  Topic Date Due   Hepatitis C Screening  Never done   DEXA SCAN  Never done   FOOT EXAM  09/12/2017   MAMMOGRAM  02/08/2019   INFLUENZA VACCINE  07/01/2020 (Originally 11/02/2019)   TETANUS/TDAP  12/02/2020 (Originally 02/15/1970)   HEMOGLOBIN A1C  02/15/2020   PNA vac Low Risk Adult (2 of 2 - PPSV23) 04/21/2020   OPHTHALMOLOGY EXAM  06/30/2020   COLONOSCOPY  01/17/2026   COVID-19 Vaccine  Completed   Dental Screening: Recommended annual dental exams for proper oral hygiene.  Community Resource Referral / Chronic Care Management: CRR required this visit?  No   CCM required this visit?  No      Plan:    Keep all routine maintenance appointments.   Mammogram and Dexa Scan ordered.   I have personally reviewed and noted the  following in the patient's chart:   Medical and social history Use of alcohol, tobacco or illicit drugs  Current medications and supplements Functional ability and status Nutritional status Physical activity Advanced directives List of other physicians Hospitalizations, surgeries, and ER visits in previous 12 months Vitals Screenings to include cognitive, depression, and falls Referrals and appointments  In addition, I have reviewed and discussed with patient certain preventive protocols, quality metrics, and best practice recommendations. A written personalized care plan for preventive services as well as general preventive health recommendations were provided to patient via mychart.     OBrien-Blaney, Kaegan Stigler L, LPN   0/06/1592     I have reviewed the above information and agree with above.   Deborra Medina, MD

## 2019-12-12 ENCOUNTER — Telehealth: Payer: Self-pay | Admitting: Internal Medicine

## 2019-12-12 NOTE — Telephone Encounter (Signed)
LMTCB

## 2019-12-12 NOTE — Telephone Encounter (Signed)
Pt returned your call.  

## 2019-12-12 NOTE — Telephone Encounter (Signed)
Pt would like a referral to [facility]

## 2019-12-15 DIAGNOSIS — M5136 Other intervertebral disc degeneration, lumbar region: Secondary | ICD-10-CM | POA: Diagnosis not present

## 2019-12-15 DIAGNOSIS — M5416 Radiculopathy, lumbar region: Secondary | ICD-10-CM | POA: Diagnosis not present

## 2019-12-15 NOTE — Telephone Encounter (Signed)
Called pt to let her know that she would need to schedule an appt with Dr. Derrel Nip to discuss the need for referral. Pt scheduled appt.

## 2019-12-22 ENCOUNTER — Encounter: Payer: Medicare HMO | Admitting: Internal Medicine

## 2019-12-22 ENCOUNTER — Other Ambulatory Visit: Payer: Self-pay

## 2020-01-04 DIAGNOSIS — R69 Illness, unspecified: Secondary | ICD-10-CM | POA: Diagnosis not present

## 2020-01-05 ENCOUNTER — Ambulatory Visit
Admission: RE | Admit: 2020-01-05 | Discharge: 2020-01-05 | Disposition: A | Discharge status: Home / Self Care | Payer: Medicare HMO | Source: Ambulatory Visit | Attending: Internal Medicine | Admitting: Internal Medicine

## 2020-01-05 ENCOUNTER — Other Ambulatory Visit: Payer: Self-pay

## 2020-01-05 DIAGNOSIS — Z13820 Encounter for screening for osteoporosis: Secondary | ICD-10-CM | POA: Diagnosis not present

## 2020-01-05 DIAGNOSIS — Z780 Asymptomatic menopausal state: Secondary | ICD-10-CM | POA: Insufficient documentation

## 2020-01-05 DIAGNOSIS — Z1231 Encounter for screening mammogram for malignant neoplasm of breast: Secondary | ICD-10-CM | POA: Diagnosis not present

## 2020-01-06 NOTE — Progress Notes (Signed)
Your DEXA scan was normal.  This means you do not have  Osteoporosis or even osteopenia.  Continue  Intake of 1200 mg calcium daily through diet and supplements,.  1000 to 2000 IUS Vit D3 daily (more if your level is low and you have been directed previously to do so) and regular weight bearing exercise. I recommending repeating the study in  5 years  Regards,   Madyx Delfin, MD     

## 2020-01-26 ENCOUNTER — Other Ambulatory Visit: Payer: Self-pay | Admitting: Internal Medicine

## 2020-01-30 ENCOUNTER — Other Ambulatory Visit: Payer: Self-pay | Admitting: Internal Medicine

## 2020-02-01 DIAGNOSIS — M722 Plantar fascial fibromatosis: Secondary | ICD-10-CM | POA: Diagnosis not present

## 2020-02-05 ENCOUNTER — Other Ambulatory Visit: Payer: Self-pay | Admitting: Internal Medicine

## 2020-02-05 DIAGNOSIS — Z760 Encounter for issue of repeat prescription: Secondary | ICD-10-CM

## 2020-02-16 ENCOUNTER — Telehealth: Payer: Self-pay

## 2020-02-16 NOTE — Telephone Encounter (Signed)
Good morning!  Ok Thank you!

## 2020-02-16 NOTE — Telephone Encounter (Signed)
-----   Message from Ashley Jacobs sent at 02/13/2020 12:17 PM EST ----- Regarding: mammo Good afternoon!  Needing new order for mammo. [id number] Thank you!

## 2020-02-16 NOTE — Telephone Encounter (Signed)
Pt has already had her mammogram done for this year. Not needed again until 01/04/2021.

## 2020-02-17 DIAGNOSIS — M722 Plantar fascial fibromatosis: Secondary | ICD-10-CM | POA: Diagnosis not present

## 2020-02-27 ENCOUNTER — Other Ambulatory Visit: Payer: Self-pay | Admitting: Internal Medicine

## 2020-03-04 ENCOUNTER — Other Ambulatory Visit: Payer: Self-pay | Admitting: Internal Medicine

## 2020-03-04 DIAGNOSIS — Z760 Encounter for issue of repeat prescription: Secondary | ICD-10-CM

## 2020-03-05 NOTE — Telephone Encounter (Signed)
LAST REFILL of alprazolam WITHOUT IN OFFICE VISIT , PLEASE SCHEDULE

## 2020-03-05 NOTE — Telephone Encounter (Signed)
LS:12-22-19 LO:02-06-20

## 2020-03-23 ENCOUNTER — Other Ambulatory Visit: Payer: Self-pay | Admitting: Internal Medicine

## 2020-04-05 ENCOUNTER — Other Ambulatory Visit: Payer: Self-pay | Admitting: Internal Medicine

## 2020-04-05 DIAGNOSIS — Z760 Encounter for issue of repeat prescription: Secondary | ICD-10-CM

## 2020-04-06 ENCOUNTER — Telehealth: Payer: Self-pay | Admitting: Internal Medicine

## 2020-04-06 DIAGNOSIS — Z760 Encounter for issue of repeat prescription: Secondary | ICD-10-CM

## 2020-04-06 MED ORDER — ALPRAZOLAM 0.5 MG PO TABS: ORAL_TABLET | ORAL | 0 refills | Status: DC

## 2020-04-06 NOTE — Telephone Encounter (Signed)
Spoke with pt and scheduled her for a follow up on 04/16/2020. Pt stated that she only has 2 or 3 pills left and wanted to see if some could be called in to get her through to her appt.

## 2020-04-06 NOTE — Telephone Encounter (Signed)
Patient called in for refill forALPRAZolam Duanne Moron) 0.5 MG tablet wanted to know do she need an in person visit or can she do a virtual

## 2020-04-06 NOTE — Telephone Encounter (Signed)
Done , MyChart message sent

## 2020-04-16 ENCOUNTER — Other Ambulatory Visit: Payer: Self-pay

## 2020-04-16 ENCOUNTER — Encounter: Payer: Self-pay | Admitting: Internal Medicine

## 2020-04-16 ENCOUNTER — Ambulatory Visit (INDEPENDENT_AMBULATORY_CARE_PROVIDER_SITE_OTHER): Payer: Medicare HMO | Admitting: Internal Medicine

## 2020-04-16 VITALS — BP 126/84 | HR 105 | Temp 98.2°F | Ht 60.98 in | Wt 162.8 lb

## 2020-04-16 DIAGNOSIS — G250 Essential tremor: Secondary | ICD-10-CM | POA: Diagnosis not present

## 2020-04-16 DIAGNOSIS — E119 Type 2 diabetes mellitus without complications: Secondary | ICD-10-CM | POA: Diagnosis not present

## 2020-04-16 DIAGNOSIS — R69 Illness, unspecified: Secondary | ICD-10-CM | POA: Diagnosis not present

## 2020-04-16 DIAGNOSIS — R4189 Other symptoms and signs involving cognitive functions and awareness: Secondary | ICD-10-CM

## 2020-04-16 DIAGNOSIS — Z1159 Encounter for screening for other viral diseases: Secondary | ICD-10-CM | POA: Diagnosis not present

## 2020-04-16 DIAGNOSIS — F3131 Bipolar disorder, current episode depressed, mild: Secondary | ICD-10-CM | POA: Diagnosis not present

## 2020-04-16 MED ORDER — TRULICITY 0.75 MG/0.5ML ~~LOC~~ SOAJ: 0.7500 mg | SUBCUTANEOUS | 1 refills | Status: DC

## 2020-04-16 MED ORDER — METFORMIN HCL ER 750 MG PO TB24: 750.0000 mg | ORAL_TABLET | Freq: Every day | ORAL | 1 refills | Status: DC

## 2020-04-16 NOTE — Patient Instructions (Signed)
Metformin XR is covered,  Along with Trulicity  I will refill the atorvastatin once I review your labs   RTC 3 months   Dulaglutide Injection What is this medicine? DULAGLUTIDE (DOO la GLOO tide) controls blood sugar in people with type 2 diabetes. It is used with lifestyle changes like diet and exercise. It may lower the risk of problems that need treatment in the hospital. These problems include heart attack or stroke. This medicine may be used for other purposes; ask your health care provider or pharmacist if you have questions. COMMON BRAND NAME(S): Trulicity What should I tell my health care provider before I take this medicine? They need to know if you have any of these conditions:  endocrine tumors (MEN 2) or if someone in your family had these tumors  eye disease, vision problems  history of pancreatitis  kidney disease  liver disease  stomach or intestine problems  thyroid cancer or if someone in your family had thyroid cancer  an unusual or allergic reaction to dulaglutide, other medicines, foods, dyes, or preservatives  pregnant or trying to get pregnant  breast-feeding How should I use this medicine? This medicine is injected under the skin. You will be taught how to prepare and give it. Take it as directed on the prescription label on the same day of each week. Do NOT prime the pen. Keep taking it unless your health care provider tells you to stop. If you use this medicine with insulin, you should inject this medicine and the insulin separately. Do not mix them together. Do not give the injections right next to each other. Change (rotate) injection sites with each injection. This drug comes with INSTRUCTIONS FOR USE. Ask your pharmacist for directions on how to use this medicine. Read the information carefully. Talk to your pharmacist or health care provider if you have questions. It is important that you put your used needles and syringes in a special sharps  container. Do not put them in a trash can. If you do not have a sharps container, call your pharmacist or health care provider to get one. A special MedGuide will be given to you by the pharmacist with each prescription and refill. Be sure to read this information carefully each time. Talk to your health care provider about the use of this medicine in children. Special care may be needed. Overdosage: If you think you have taken too much of this medicine contact a poison control center or emergency room at once. NOTE: This medicine is only for you. Do not share this medicine with others. What if I miss a dose? If you miss a dose, take it as soon as you can unless it is more than 3 days late. If it is more than 3 days late, skip the missed dose. Take the next dose at the normal time. What may interact with this medicine?  other medicines for diabetes Many medications may cause changes in blood sugar, these include:  alcohol containing beverages  antiviral medicines for HIV or AIDS  aspirin and aspirin-like drugs  certain medicines for blood pressure, heart disease, irregular heart beat  chromium  diuretics  female hormones, such as estrogens or progestins, birth control pills  fenofibrate  gemfibrozil  isoniazid  lanreotide  female hormones or anabolic steroids  MAOIs like Carbex, Eldepryl, Marplan, Nardil, and Parnate  medicines for allergies, asthma, cold, or cough  medicines for depression, anxiety, or psychotic disturbances  medicines for weight loss  niacin  nicotine  NSAIDs, medicines for pain and inflammation, like ibuprofen or naproxen  octreotide  pasireotide  pentamidine  phenytoin  probenecid  quinolone antibiotics such as ciprofloxacin, levofloxacin, ofloxacin  some herbal dietary supplements  steroid medicines such as prednisone or cortisone  sulfamethoxazole; trimethoprim  thyroid hormones Some medications can hide the warning symptoms of  low blood sugar (hypoglycemia). You may need to monitor your blood sugar more closely if you are taking one of these medications. These include:  beta-blockers, often used for high blood pressure or heart problems (examples include atenolol, metoprolol, propranolol)  clonidine  guanethidine  reserpine This list may not describe all possible interactions. Give your health care provider a list of all the medicines, herbs, non-prescription drugs, or dietary supplements you use. Also tell them if you smoke, drink alcohol, or use illegal drugs. Some items may interact with your medicine. What should I watch for while using this medicine? Visit your health care provider for regular checks on your progress. Check with your health care provider if you have severe diarrhea, nausea, and vomiting, or if you sweat a lot. The loss of too much body fluid may make it dangerous for you to take this medicine. A test called the HbA1C (A1C) will be monitored. This is a simple blood test. It measures your blood sugar control over the last 2 to 3 months. You will receive this test every 3 to 6 months. Learn how to check your blood sugar. Learn the symptoms of low and high blood sugar and how to manage them. Always carry a quick-source of sugar with you in case you have symptoms of low blood sugar. Examples include hard sugar candy or glucose tablets. Make sure others know that you can choke if you eat or drink when you develop serious symptoms of low blood sugar, such as seizures or unconsciousness. Get medical help at once. Tell your health care provider if you have high blood sugar. You might need to change the dose of your medicine. If you are sick or exercising more than usual, you may need to change the dose of your medicine. Do not skip meals. Ask your health care provider if you should avoid alcohol. Many nonprescription cough and cold products contain sugar or alcohol. These can affect blood sugar. Pens should  never be shared. Even if the needle is changed, sharing may result in passing of viruses like hepatitis or HIV. Wear a medical ID bracelet or chain. Carry a card that describes your condition. List the medicines and doses you take on the card. What side effects may I notice from receiving this medicine? Side effects that you should report to your doctor or health care professional as soon as possible:  allergic reactions (skin rash, itching or hives; swelling of the face, lips, or tongue)  changes in vision  diarrhea that continues or is severe  infection (fever, chills, cough, sore throat, pain or trouble passing urine)  kidney injury (trouble passing urine or change in the amount of urine)  low blood sugar (feeling anxious; confusion; dizziness; increased hunger; unusually weak or tired; increased sweating; shakiness; cold, clammy skin; irritable; headache; blurred vision; fast heartbeat; loss of consciousness)  lump or swelling on the neck  trouble breathing  trouble swallowing  unusual stomach upset or pain  vomiting Side effects that usually do not require medical attention (report to your doctor or health care professional if they continue or are bothersome):  lack or loss of appetite  nausea  pain, redness, or  irritation at site where injected This list may not describe all possible side effects. Call your doctor for medical advice about side effects. You may report side effects to FDA at 1-800-FDA-1088. Where should I keep my medicine? Keep out of the reach of children and pets. Refrigeration (preferred): Store unopened pens in a refrigerator between 2 and 8 degrees C (36 and 46 degrees F). Keep it in the original carton until you are ready to take it. Do not freeze or use if the medicine has been frozen. Protect from light. Get rid of any unused medicine after the expiration date on the label. Room Temperature: The pen may be stored at room temperature below 30 degrees C  (86 degrees F) for up to a total of 14 days if needed. Protect from light. Avoid exposure to extreme heat. If it is stored at room temperature, throw away any unused medicine after 14 days or after it expires, whichever is first. To get rid of medicines that are no longer needed or have expired:  Take the medicine to a medicine take-back program. Check with your pharmacy or law enforcement to find a location.  If you cannot return the medicine, ask your pharmacist or health care provider how to get rid of this medicine safely. NOTE: This sheet is a summary. It may not cover all possible information. If you have questions about this medicine, talk to your doctor, pharmacist, or health care provider.  2021 Elsevier/Gold Standard (2020-01-19 07:35:51)

## 2020-04-16 NOTE — Progress Notes (Signed)
Subjective:  Patient ID: Katherine Jimenez, female    DOB: February 27, 1951  Age: 70 y.o. MRN: [id number]  CC: The primary encounter diagnosis was Benign essential tremor. Diagnoses of Bipolar affective disorder, currently depressed, mild ([place]), Diabetes mellitus without complication ([place]), Encounter for hepatitis C screening test for low risk patient, and Cognitive changes were also pertinent to this visit.  HPI TEMPERANCE KELEMEN presents for follow up on type 2 diabetes,  Diet controlled, complicated by hypertension and hyperlipidemia.  Last seen May 2021  This visit occurred during the SARS-CoV-2 public health emergency.  Safety protocols were in place, including screening questions prior to the visit, additional usage of staff PPE, and extensive cleaning of exam room while observing appropriate contact time as indicated for disinfecting solutions.    she feels generally well, [facility]  4-5 days per week .  not checking blood sugars unless she has a headache  .  cbg was 158 today post prandiaally after several  fig newtons. cbg  Was 198  2-3 hours after seating sweets once daily at variable times.  BS have been under 130 fasting and < 150 post prandially.  Denies any recent hypoglyemic events.  Taking his medications as directed. Following a carbohydrate modified diet 6 days per week. Denies numbness, burning and tingling of extremities. Appetite is good. Ran out of metformin 6 months ago.  .  Wants to start trulicity to lose weight  Sent for cognitive testing by neurologist for testing.  Told she had cognitive deficits. She notes occasional difficulty finding words,   remembering names..  No trouble balancing checkbook, paying bills,  Not getting lost or having mishap  sin the kitchen/    Taking 4 motrin and 2 ES tylenol  For chronic back pain 2 times daily    Outpatient Medications Prior to Visit  Medication Sig Dispense Refill  .  acetaminophen (TYLENOL) 500 MG tablet Take 1,000 mg by mouth every 6 (six) hours as needed (FOR PAIN.).    Marland Kitchen ALPRAZolam (XANAX) 0.5 MG tablet TAKE ONE TABLET BY MOUTH EVERY NIGHT AT BEDTIME AS NEEDED FOR INSOMNIA 30 tablet 0  . atorvastatin (LIPITOR) 10 MG tablet Take 10 mg by mouth daily.    . Biotin 5 MG CAPS Take 2 capsules by mouth 2 (two) times daily.     . blood glucose meter kit and supplies Dispense Contour next bayer meter. E11.9 To check blood glucose  Once a day. 1 each 0  . buPROPion (WELLBUTRIN XL) 150 MG 24 hr tablet Take 1 tablet (150 mg total) by mouth daily. 30 tablet 2  . buPROPion (WELLBUTRIN XL) 300 MG 24 hr tablet TAKE ONE TABLET BY MOUTH DAILY 30 tablet 1  . Cholecalciferol (VITAMIN D3) 1000 units CAPS Take 1,000 Units by mouth daily.     . cyanocobalamin (,VITAMIN B-12,) 1000 MCG/ML injection INJECT 1ML INTO THE MUSCLE ONCE A WEEK FOR 3 WEEKS THEN MONTHLY 5 mL 8  . diazepam (VALIUM) 5 MG tablet Take 1 tablet (5 mg total) by mouth every 8 (eight) hours as needed for muscle spasms. 20 tablet 0  . diphenhydramine-acetaminophen (TYLENOL PM) 25-500 MG TABS tablet Take 1 tablet by mouth at bedtime as needed.    . DULoxetine (CYMBALTA) 30 MG capsule TAKE ONE CAPSULE BY MOUTH DAILY 90 capsule 0  . glucose blood (FREESTYLE LITE) test strip Use once daily  as instructed to test blood sugar E 11.9  100 each 3  . lamoTRIgine (LAMICTAL) 200 MG tablet TAKE 1 TABLET BY MOUTH TWICE A DAY 180 tablet 1  . Lancets (FREESTYLE) lancets Use once daily to check blood sugars  E11.9 100 each 3  . losartan (COZAAR) 25 MG tablet TAKE TWO TABLETS BY MOUTH ONCE DAILY 180 tablet 1  . omeprazole (PRILOSEC) 40 MG capsule TAKE ONE CAPSULE BY MOUTH DAILY 90 capsule 3  . Syringe, Disposable, 1 ML MISC For use with B12 injections 25 each 3  . tiZANidine (ZANAFLEX) 2 MG tablet tizanidine 2 mg tablet  TAKE 1 TABLET BY MOUTH TWICE A DAY    . vitamin E 1000 UNIT capsule Take 1,000 Units by mouth daily.    .  celecoxib (CELEBREX) 200 MG capsule celecoxib 200 mg capsule    . metFORMIN (GLUCOPHAGE) 850 MG tablet TAKE 1 TABLET BY MOUTH TWICE DAILY WITH A MEAL (Patient taking differently: Take by mouth daily with breakfast. TAKE 1/2 TABLET BY MOUTH TWICE DAILY WITH A MEAL) 180 tablet 1   No facility-administered medications prior to visit.    Review of Systems;  Patient denies headache, fevers, malaise, unintentional weight loss, skin rash, eye pain, sinus congestion and sinus pain, sore throat, dysphagia,  hemoptysis , cough, dyspnea, wheezing, chest pain, palpitations, orthopnea, edema, abdominal pain, nausea, melena, diarrhea, constipation, flank pain, dysuria, hematuria, urinary  Frequency, nocturia, numbness, tingling, seizures,  Focal weakness, Loss of consciousness,  Tremor, insomnia, depression, anxiety, and suicidal ideation.      Objective:  BP 126/84 (BP Location: Left Arm, Patient Position: Sitting)   Pulse (!) 105   Temp 98.2 F (36.8 C)   Ht 5' 0.98" (1.549 m)   Wt 162 lb 12.8 oz (73.8 kg)   SpO2 96%   BMI 30.78 kg/m   BP Readings from Last 3 Encounters:  04/16/20 126/84  08/01/19 (!) 148/88  03/17/19 (!) 162/82    Wt Readings from Last 3 Encounters:  04/16/20 162 lb 12.8 oz (73.8 kg)  12/03/19 173 lb (78.5 kg)  08/01/19 173 lb 6.4 oz (78.7 kg)    General appearance: alert, cooperative and appears stated age Ears: normal TM's and external ear canals both ears Throat: lips, mucosa, and tongue normal; teeth and gums normal Neck: no adenopathy, no carotid bruit, supple, symmetrical, trachea midline and thyroid not enlarged, symmetric, no tenderness/mass/nodules Back: symmetric, no curvature. ROM normal. No CVA tenderness. Lungs: clear to auscultation bilaterally Heart: regular rate and rhythm, S1, S2 normal, no murmur, click, rub or gallop Abdomen: soft, non-tender; bowel sounds normal; no masses,  no organomegaly Pulses: 2+ and symmetric Skin: Skin color, texture,  turgor normal. No rashes or lesions Lymph nodes: Cervical, supraclavicular, and axillary nodes normal. Neuro:  Fine tremor with outstretched hands.  Gait: narrow based, short stride,  No loss of balance on turn, normal arm swing bilaterally  Lab Results  Component Value Date   HGBA1C 6.6 (H) 04/16/2020   HGBA1C 6.7 (H) 08/15/2019   HGBA1C 6.6 (H) 07/08/2018    Lab Results  Component Value Date   CREATININE 0.94 04/16/2020   CREATININE 0.79 08/15/2019   CREATININE 0.70 11/16/2018    Lab Results  Component Value Date   WBC 8.4 08/19/2018   HGB 12.1 08/19/2018   HCT 37.5 08/19/2018   PLT 287 08/19/2018   GLUCOSE 104 (H) 04/16/2020   CHOL 298 (H) 04/16/2020   TRIG 112 04/16/2020   HDL 122 04/16/2020   LDLDIRECT 112.0 10/13/2016   LDLCALC 153 (H) 04/16/2020  ALT 15 04/16/2020   AST 13 04/16/2020   NA 137 04/16/2020   K 4.6 04/16/2020   CL 97 (L) 04/16/2020   CREATININE 0.94 04/16/2020   BUN 12 04/16/2020   CO2 25 04/16/2020   TSH 0.93 04/16/2020   INR 1.05 05/23/2017   HGBA1C 6.6 (H) 04/16/2020   MICROALBUR 2.0 04/16/2020    DEXAScan  Result Date: 01/05/2020 EXAM: DUAL X-RAY ABSORPTIOMETRY (DXA) FOR BONE MINERAL DENSITY IMPRESSION: Your patient Temeca Somma completed a BMD test on 01/05/2020 using the [place] (software version: 14.10) manufactured by [employer]. The following summarizes the results of our evaluation. Technologist: [facility] PATIENT BIOGRAPHICAL: Name: Dorismar, Chay Patient ID: [id number] Birth Date: Feb 19, 1951 Height: 60.5 in. Gender: Female Exam Date: 01/05/2020 Weight: 167.4 lbs. Indications: Caucasian, Left hip replacement, Postmenopausal, right hip replacement, Right knee replacement, Scoliosis Fractures: Treatments: Vitamin D DENSITOMETRY RESULTS: Site         Region     Measured Date Measured Age WHO Classification Young Adult T-score BMD         %Change vs. Previous Significant Change (*) Left Forearm Radius 33% 01/05/[age]  67.8 Normal -0.5 0.829 g/cm2 ASSESSMENT: The BMD measured at Forearm Radius 33% is 0.829 g/cm2 with a T-score of -0.5. This patient is considered normal according to [facility]) criteria. Lumbar spine was not utilized due to scoliosis. Left and Right femur was excluded due to surgical hardware. The scan quality is good. World Pharmacologist [facility]) criteria for post-menopausal, Caucasian Women: Normal:                   T-score at or above -1 SD Osteopenia/low bone mass: T-score between -1 and -2.5 SD Osteoporosis:             T-score at or below -2.5 SD RECOMMENDATIONS: 1. All patients should optimize calcium and vitamin D intake. 2. Consider FDA-approved medical therapies in postmenopausal women and men aged 28 years and older, based on the following: a. A hip or vertebral(clinical or morphometric) fracture b. T-score < -2.5 at the femoral neck or spine after appropriate evaluation to exclude secondary causes c. Low bone mass (T-score between -1.0 and -2.5 at the femoral neck or spine) and a 10-year probability of a hip fracture > 3% or a 10-year probability of a major osteoporosis-related fracture > 20% based on the [place]-adapted WHO algorithm 3. Clinician judgment and/or patient preferences may indicate treatment for people with 10-year fracture probabilities above or below these levels FOLLOW-UP: People with diagnosed cases of osteoporosis or at high risk for fracture should have regular bone mineral density tests. For patients eligible for Medicare, routine testing is allowed once every 2 years. The testing frequency can be increased to one year for patients who have rapidly progressing disease, those who are receiving or discontinuing medical therapy to restore bone mass, or have additional risk factors. I have reviewed this report, and agree with the above findings. [facility] Electronically Signed   By: Kerby Moors M.D.   On: 01/05/2020 09:11   MM 3D SCREEN BREAST  BILATERAL  Result Date: 01/05/2020 CLINICAL DATA:  Screening. EXAM: DIGITAL SCREENING BILATERAL MAMMOGRAM WITH TOMO AND CAD COMPARISON:  Previous exam(s). ACR Breast Density Category b: There are scattered areas of fibroglandular density. FINDINGS: There are no findings suspicious for malignancy. Images were processed with CAD. IMPRESSION: No mammographic evidence of malignancy. A result letter of this screening mammogram will be mailed directly to  the patient. RECOMMENDATION: Screening mammogram in one year. (Code:[id number]-B-01Y) BI-RADS CATEGORY  1: Negative. Electronically Signed   By: Lovey Newcomer M.D.   On: 01/05/2020 12:32    Assessment & Plan:   Problem List Items Addressed This Visit      Unprioritized   Bipolar disorder ([place])   Cognitive changes    She has been evaluated by Neurology and sent for comprehensive cognitive testing .  Diagnosis is unclear  Reassurance that her tremor does not suggest Parkinson      Diabetes mellitus without complication ([place])    She continues to maintain excellent control with metformin at dose of  850 mg bid, but she is overdue for labs by 6 months .  Advised to continue low glycemic index diet and return for fasting labs  .  She is unable to resume resume regular exercise due to back pain and DJD right knee.  No proteinuria  Lab Results  Component Value Date   HGBA1C 6.6 (H) 04/16/2020    Lab Results  Component Value Date   MICROALBUR 2.0 04/16/2020             Relevant Medications   Dulaglutide (TRULICITY) 0.37 VO/3.6GO SOPN   metFORMIN (GLUCOPHAGE XR) 750 MG 24 hr tablet   Other Relevant Orders   Comprehensive metabolic panel (Completed)   Hemoglobin A1c (Completed)   Lipid panel (Completed)   Microalbumin / creatinine urine ratio (Completed)    Other Visit Diagnoses    Benign essential tremor    -  Primary   Relevant Orders   TSH (Completed)   Encounter for hepatitis C screening test for low risk patient       Relevant Orders    Hepatitis C antibody      I provided  30 minutes of  face-to-face time during this encounter reviewing patient's current problems and past surgeries, labs and imaging studies, providing counseling on the above mentioned problems , and coordination  of care .  I have discontinued Kaleigha P. Grabill's metFORMIN and celecoxib. I am also having her start on metFORMIN. Additionally, I am having her maintain her glucose blood, freestyle, Syringe (Disposable), blood glucose meter kit and supplies, Vitamin D3, Biotin, vitamin E, acetaminophen, diazepam, tiZANidine, atorvastatin, diphenhydramine-acetaminophen, buPROPion, lamoTRIgine, losartan, omeprazole, cyanocobalamin, buPROPion, DULoxetine, ALPRAZolam, and Trulicity.  Meds ordered this encounter  Medications  . DISCONTD: Dulaglutide (TRULICITY) 7.70 HE/0.3TC SOPN    Sig: Inject 0.75 mg into the skin once a week.    Dispense:  2 mL    Refill:  1  . Dulaglutide (TRULICITY) 4.81 YH/9.0BP SOPN    Sig: Inject 0.75 mg into the skin once a week.    Dispense:  2 mL    Refill:  1  . metFORMIN (GLUCOPHAGE XR) 750 MG 24 hr tablet    Sig: Take 1 tablet (750 mg total) by mouth daily with breakfast.    Dispense:  90 tablet    Refill:  1    Medications Discontinued During This Encounter  Medication Reason  . celecoxib (CELEBREX) 200 MG capsule   . Dulaglutide (TRULICITY) 1.12 TK/2.4EC SOPN   . metFORMIN (GLUCOPHAGE) 850 MG tablet     Follow-up: Return in about 3 months (around 07/15/2020) for follow up diabetes.   Crecencio Mc, MD

## 2020-04-17 LAB — MICROALBUMIN / CREATININE URINE RATIO
Creatinine, Urine: 273 mg/dL (ref 20–275)
Microalb Creat Ratio: 7 mcg/mg creat (ref <30)
Microalb, Ur: 2 mg/dL

## 2020-04-18 NOTE — Progress Notes (Signed)
Your diabetes remains under excellent control  but your cholesterol  is elevated. Have you stopped taking the atorvastatin?   Regards,   Deborra Medina, MD

## 2020-04-18 NOTE — Assessment & Plan Note (Signed)
She continues to maintain excellent control with metformin at dose of  850 mg bid, but she is overdue for labs by 6 months .  Advised to continue low glycemic index diet and return for fasting labs  .  She is unable to resume resume regular exercise due to back pain and DJD right knee.  No proteinuria  Lab Results  Component Value Date   HGBA1C 6.6 (H) 04/16/2020    Lab Results  Component Value Date   MICROALBUR 2.0 04/16/2020

## 2020-04-18 NOTE — Assessment & Plan Note (Signed)
She has been evaluated by Neurology and sent for comprehensive cognitive testing .  Diagnosis is unclear  Reassurance that her tremor does not suggest Parkinson

## 2020-04-20 LAB — COMPREHENSIVE METABOLIC PANEL
AG Ratio: 1.7 (calc) (ref 1.0–2.5)
ALT: 15 U/L (ref 6–29)
AST: 13 U/L (ref 10–35)
Albumin: 4.7 g/dL (ref 3.6–5.1)
Alkaline phosphatase (APISO): 106 U/L (ref 37–153)
BUN: 12 mg/dL (ref 7–25)
CO2: 25 mmol/L (ref 20–32)
Calcium: 10.6 mg/dL — ABNORMAL HIGH (ref 8.6–10.4)
Chloride: 97 mmol/L — ABNORMAL LOW (ref 98–110)
Creat: 0.94 mg/dL (ref 0.50–0.99)
Globulin: 2.8 g/dL (calc) (ref 1.9–3.7)
Glucose, Bld: 104 mg/dL — ABNORMAL HIGH (ref 65–99)
Potassium: 4.6 mmol/L (ref 3.5–5.3)
Sodium: 137 mmol/L (ref 135–146)
Total Bilirubin: 0.5 mg/dL (ref 0.2–1.2)
Total Protein: 7.5 g/dL (ref 6.1–8.1)

## 2020-04-20 LAB — LIPID PANEL
Cholesterol: 298 mg/dL — ABNORMAL HIGH (ref <200)
HDL: 122 mg/dL (ref >=50)
LDL Cholesterol (Calc): 153 mg/dL (calc) — ABNORMAL HIGH
Non-HDL Cholesterol (Calc): 176 mg/dL (calc) — ABNORMAL HIGH (ref <130)
Total CHOL/HDL Ratio: 2.4 (calc) (ref <5.0)
Triglycerides: 112 mg/dL (ref <150)

## 2020-04-20 LAB — HEMOGLOBIN A1C
Hgb A1c MFr Bld: 6.6 % of total Hgb — ABNORMAL HIGH (ref <5.7)
Mean Plasma Glucose: 143 mg/dL
eAG (mmol/L): 7.9 mmol/L

## 2020-04-20 LAB — HEPATITIS C ANTIBODY
Hepatitis C Ab: NONREACTIVE
SIGNAL TO CUT-OFF: 0 (ref <1.00)

## 2020-04-20 LAB — TSH: TSH: 0.93 mIU/L (ref 0.40–4.50)

## 2020-04-23 ENCOUNTER — Telehealth: Payer: Self-pay | Admitting: Internal Medicine

## 2020-04-23 ENCOUNTER — Other Ambulatory Visit: Payer: Self-pay

## 2020-04-23 DIAGNOSIS — E119 Type 2 diabetes mellitus without complications: Secondary | ICD-10-CM

## 2020-04-23 MED ORDER — CYANOCOBALAMIN 1000 MCG/ML IJ SOLN: 1000.0000 ug | INTRAMUSCULAR | 8 refills | Status: DC

## 2020-04-23 MED ORDER — ATORVASTATIN CALCIUM 10 MG PO TABS: 10.0000 mg | ORAL_TABLET | Freq: Every day | ORAL | 3 refills | Status: DC

## 2020-04-23 NOTE — Telephone Encounter (Signed)
Spoke with pt and informed her of her lab results. Pt stated that she has not been taking the lipitor because she didn't have a refill on it. I have refilled it and her b12. Pt is wanting to know if she needs to still start the Trulicity?

## 2020-04-23 NOTE — Telephone Encounter (Signed)
Spoke with pt to let her know that she does need to start the trulicity and resume the cholesterol medication. Also scheduled pt for a lab appt in 6 weeks.

## 2020-04-23 NOTE — Telephone Encounter (Signed)
Patient called in for results from labs

## 2020-04-23 NOTE — Telephone Encounter (Signed)
Medications have been resent to [facility] and pt is aware.

## 2020-04-23 NOTE — Telephone Encounter (Signed)
Yes,  Start the trulicity,  Resume the cholesterol medication and Return for cmet in 6 weeks

## 2020-04-23 NOTE — Telephone Encounter (Signed)
Patient called in stated that her medication went to the wrong pharmacy should have gone [facility] but [facility] 10 MG tablet and cyanocobalamin (,VITAMIN B-12,) 1000 MCG/ML injection [facility] has now been [facility] from [facility] at the request of patient

## 2020-04-28 ENCOUNTER — Other Ambulatory Visit: Payer: Self-pay | Admitting: Internal Medicine

## 2020-05-06 ENCOUNTER — Telehealth: Payer: Self-pay | Admitting: Internal Medicine

## 2020-05-06 MED ORDER — TIZANIDINE HCL 2 MG PO TABS: ORAL_TABLET | ORAL | 1 refills | Status: DC

## 2020-05-06 MED ORDER — TIZANIDINE HCL 2 MG PO TABS: 2.0000 mg | ORAL_TABLET | Freq: Two times a day (BID) | ORAL | 1 refills | Status: DC

## 2020-05-06 NOTE — Addendum Note (Signed)
Addended by: Elpidio Galea T on: 05/06/2020 01:19 PM   Modules accepted: Orders

## 2020-05-06 NOTE — Telephone Encounter (Signed)
Patient called in for refill fortiZANidine (ZANAFLEX) 2 MG tablet

## 2020-05-07 ENCOUNTER — Other Ambulatory Visit: Payer: Self-pay | Admitting: Internal Medicine

## 2020-05-07 DIAGNOSIS — Z760 Encounter for issue of repeat prescription: Secondary | ICD-10-CM

## 2020-05-07 NOTE — Telephone Encounter (Signed)
IT Refill:Xanax Last Seen:04-16-20 Last ordered:04-06-20

## 2020-05-07 NOTE — Telephone Encounter (Signed)
Pt called to follow up on refill request  °

## 2020-05-11 ENCOUNTER — Telehealth: Payer: Self-pay

## 2020-05-11 DIAGNOSIS — F5105 Insomnia due to other mental disorder: Secondary | ICD-10-CM

## 2020-05-11 DIAGNOSIS — F418 Other specified anxiety disorders: Secondary | ICD-10-CM

## 2020-05-11 MED ORDER — TEMAZEPAM 22.5 MG PO CAPS: 22.5000 mg | ORAL_CAPSULE | Freq: Every evening | ORAL | 0 refills | Status: DC | PRN
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
## 2020-05-12 MED ORDER — LORAZEPAM 0.5 MG PO TABS: 0.5000 mg | ORAL_TABLET | Freq: Two times a day (BID) | ORAL | 0 refills | Status: DC | PRN

## 2020-05-12 NOTE — Telephone Encounter (Signed)
Left message for patient to return call back. Also instructed patient to check Mychart.

## 2020-05-12 NOTE — Telephone Encounter (Signed)
Called patient and spoke about medication. Katherine Jimenez states that her medication needs a prior authorization. Prior authorization has been sent in. Katherine Jimenez asks if theres any other medication that she could take while waiting for the PA determination.

## 2020-05-12 NOTE — Telephone Encounter (Signed)
PA through cover my meds could not be completed and required I call [facility] to submit the PA. Called and submitted the PA for Temazapam. Was instructed that a determination will be faxed over within 72 hours.

## 2020-05-12 NOTE — Addendum Note (Signed)
Addended by: Crecencio Mc on: 05/12/2020 05:16 PM   Modules accepted: Orders

## 2020-05-12 NOTE — Telephone Encounter (Signed)
Pt said she needs just 1 pill to get through the night. Is there anything we can do?

## 2020-05-12 NOTE — Telephone Encounter (Signed)
Pt said that she went to pickup the medication an it was going to be $300. The Pharmacy said that it need a PA  Pt would like a call back

## 2020-05-17 NOTE — Telephone Encounter (Signed)
PA for Temazepam has been denied.

## 2020-06-02 ENCOUNTER — Other Ambulatory Visit: Payer: Self-pay | Admitting: Internal Medicine

## 2020-06-03 NOTE — Telephone Encounter (Signed)
RX Refill:ativan Last Seen:04-16-20 Last ordered:05-12-20

## 2020-06-04 ENCOUNTER — Other Ambulatory Visit (INDEPENDENT_AMBULATORY_CARE_PROVIDER_SITE_OTHER): Payer: Medicare HMO

## 2020-06-04 ENCOUNTER — Other Ambulatory Visit: Payer: Self-pay

## 2020-06-04 DIAGNOSIS — E119 Type 2 diabetes mellitus without complications: Secondary | ICD-10-CM

## 2020-06-04 LAB — COMPREHENSIVE METABOLIC PANEL
ALT: 10 U/L (ref 0–35)
AST: 13 U/L (ref 0–37)
Albumin: 4.3 g/dL (ref 3.5–5.2)
Alkaline Phosphatase: 83 U/L (ref 39–117)
BUN: 9 mg/dL (ref 6–23)
CO2: 29 mEq/L (ref 19–32)
Calcium: 9.5 mg/dL (ref 8.4–10.5)
Chloride: 100 mEq/L (ref 96–112)
Creatinine, Ser: 0.83 mg/dL (ref 0.40–1.20)
GFR: 72.5 mL/min (ref >60.00)
Glucose, Bld: 110 mg/dL — ABNORMAL HIGH (ref 70–99)
Potassium: 4.3 mEq/L (ref 3.5–5.1)
Sodium: 138 mEq/L (ref 135–145)
Total Bilirubin: 0.4 mg/dL (ref 0.2–1.2)
Total Protein: 7.1 g/dL (ref 6.0–8.3)

## 2020-06-06 ENCOUNTER — Telehealth: Payer: Self-pay | Admitting: Internal Medicine

## 2020-06-15 ENCOUNTER — Other Ambulatory Visit: Payer: Self-pay | Admitting: Internal Medicine

## 2020-06-22 ENCOUNTER — Other Ambulatory Visit: Payer: Self-pay | Admitting: Internal Medicine

## 2020-06-25 ENCOUNTER — Other Ambulatory Visit: Payer: Self-pay | Admitting: Internal Medicine

## 2020-06-25 NOTE — Telephone Encounter (Signed)
Pt called and requests refill on buPROPion (WELLBUTRIN XL) 300 MG 24 hr tablet sent to [facility]. She states that she took her last one today

## 2020-07-19 DIAGNOSIS — M5416 Radiculopathy, lumbar region: Secondary | ICD-10-CM | POA: Diagnosis not present

## 2020-07-19 DIAGNOSIS — M48062 Spinal stenosis, lumbar region with neurogenic claudication: Secondary | ICD-10-CM | POA: Diagnosis not present

## 2020-07-19 DIAGNOSIS — M5136 Other intervertebral disc degeneration, lumbar region: Secondary | ICD-10-CM | POA: Diagnosis not present

## 2020-07-28 ENCOUNTER — Encounter: Payer: Self-pay | Admitting: Internal Medicine

## 2020-07-28 ENCOUNTER — Ambulatory Visit (INDEPENDENT_AMBULATORY_CARE_PROVIDER_SITE_OTHER): Payer: Medicare HMO | Admitting: Internal Medicine

## 2020-07-28 ENCOUNTER — Other Ambulatory Visit: Payer: Self-pay

## 2020-07-28 VITALS — BP 120/82 | HR 102 | Temp 98.6°F | Resp 15 | Ht 60.0 in | Wt 159.6 lb

## 2020-07-28 DIAGNOSIS — I10 Essential (primary) hypertension: Secondary | ICD-10-CM | POA: Diagnosis not present

## 2020-07-28 DIAGNOSIS — E1169 Type 2 diabetes mellitus with other specified complication: Secondary | ICD-10-CM | POA: Insufficient documentation

## 2020-07-28 DIAGNOSIS — F5105 Insomnia due to other mental disorder: Secondary | ICD-10-CM | POA: Diagnosis not present

## 2020-07-28 DIAGNOSIS — F418 Other specified anxiety disorders: Secondary | ICD-10-CM

## 2020-07-28 DIAGNOSIS — R69 Illness, unspecified: Secondary | ICD-10-CM | POA: Diagnosis not present

## 2020-07-28 DIAGNOSIS — E785 Hyperlipidemia, unspecified: Secondary | ICD-10-CM

## 2020-07-28 LAB — LIPID PANEL
Cholesterol: 191 mg/dL (ref 0–200)
HDL: 96.9 mg/dL (ref >39.00)
LDL Cholesterol: 78 mg/dL (ref 0–99)
NonHDL: 94.03
Total CHOL/HDL Ratio: 2
Triglycerides: 81 mg/dL (ref 0.0–149.0)
VLDL: 16.2 mg/dL (ref 0.0–40.0)

## 2020-07-28 LAB — HEMOGLOBIN A1C: Hgb A1c MFr Bld: 6.4 % (ref 4.6–6.5)

## 2020-07-28 LAB — COMPREHENSIVE METABOLIC PANEL
ALT: 11 U/L (ref 0–35)
AST: 11 U/L (ref 0–37)
Albumin: 4.4 g/dL (ref 3.5–5.2)
Alkaline Phosphatase: 92 U/L (ref 39–117)
BUN: 12 mg/dL (ref 6–23)
CO2: 26 mEq/L (ref 19–32)
Calcium: 9.9 mg/dL (ref 8.4–10.5)
Chloride: 100 mEq/L (ref 96–112)
Creatinine, Ser: 0.81 mg/dL (ref 0.40–1.20)
GFR: 74.57 mL/min (ref >60.00)
Glucose, Bld: 102 mg/dL — ABNORMAL HIGH (ref 70–99)
Potassium: 4.2 mEq/L (ref 3.5–5.1)
Sodium: 138 mEq/L (ref 135–145)
Total Bilirubin: 0.4 mg/dL (ref 0.2–1.2)
Total Protein: 7 g/dL (ref 6.0–8.3)

## 2020-07-28 MED ORDER — LORAZEPAM 0.5 MG PO TABS: ORAL_TABLET | ORAL | 0 refills | Status: DC

## 2020-07-28 NOTE — Patient Instructions (Signed)
1) check BP 3 times at home before increasing losartan to 50 mg daily.  Goal is 130/80 or less  2) lorazepam increased to 1.5 tablets at bedtime.  Let me know if it is effective at that dose.
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
## 2020-07-31 NOTE — Assessment & Plan Note (Signed)
she reports compliance with medication regimen  but has reduced losartan dose to 25 mg daily on her own and has  an elevated reading today in office.  She is not using NSAIDs daily.  Discussed goal of 120/70  (130/80 for patients over 70)  to preserve renal function.  She has been asked to check her  BP  at home and  submit readings for evaluation. Renal function, electrolytes and screen for proteinuria are all normal  Lab Results  Component Value Date   CREATININE 0.81 07/28/2020   Lab Results  Component Value Date   NA 138 07/28/2020   K 4.2 07/28/2020   CL 100 07/28/2020   CO2 26 07/28/2020
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
## 2020-07-31 NOTE — Assessment & Plan Note (Addendum)
Currently well-controlled on current medications .  hemoglobin A1c is at goal of less than 7.0 . Patient is reminded to schedule an annual eye exam and foot exam is normal today. Patient has no microalbuminuria. Patient is tolerating statin therapy  with atorvastatin 20 mg daily.Marland Kitchen  LDL is at goal and LFTs are normal.  Taking 25 mg losartan for BP.  No changes today  Lab Results  Component Value Date   HGBA1C 6.4 07/28/2020   Lab Results  Component Value Date   LABMICR See below: 03/17/2019   LABMICR See below: 02/03/2019   MICROALBUR 2.0 04/16/2020   MICROALBUR 1.9 08/15/2019    Lab Results  Component Value Date   CHOL 191 07/28/2020   HDL 96.90 07/28/2020   LDLCALC 78 07/28/2020   LDLDIRECT 112.0 10/13/2016   TRIG 81.0 07/28/2020   CHOLHDL 2 07/28/2020  . Lab Results  Component Value Date   ALT 11 07/28/2020   AST 11 07/28/2020   ALKPHOS 92 07/28/2020   BILITOT 0.4 07/28/2020

## 2020-08-18 ENCOUNTER — Ambulatory Visit: Payer: Medicare HMO | Admitting: Internal Medicine

## 2020-08-22 ENCOUNTER — Other Ambulatory Visit: Payer: Self-pay | Admitting: Internal Medicine

## 2020-09-01 ENCOUNTER — Telehealth: Payer: Self-pay | Admitting: Internal Medicine

## 2020-09-01 NOTE — Telephone Encounter (Signed)
Transfer to Access Nurse    PT called in stating that she is having issues with bladder. She thinks it is a infection and might be a UTI. We have no appts available today.

## 2020-09-01 NOTE — Telephone Encounter (Signed)
FYI per Access Nurse patient going to [facility]

## 2020-09-01 NOTE — Telephone Encounter (Signed)
Noted. Awaiting report from Access Nurse.

## 2020-09-06 NOTE — Telephone Encounter (Signed)
PT called to advise that she would just like to have lab tests done for the UTI. She does not want to go to [facility] but if she does have to be seen she is okay with seeing someone here such as a NP and Dr. PT also has already been to Health and safety inspector.

## 2020-09-07 ENCOUNTER — Ambulatory Visit
Admission: EM | Admit: 2020-09-07 | Discharge: 2020-09-07 | Disposition: A | Discharge status: Home / Self Care | Payer: Medicare HMO | Attending: Emergency Medicine | Admitting: Emergency Medicine

## 2020-09-07 ENCOUNTER — Other Ambulatory Visit: Payer: Self-pay

## 2020-09-07 ENCOUNTER — Encounter: Payer: Self-pay | Admitting: Emergency Medicine

## 2020-09-07 DIAGNOSIS — N390 Urinary tract infection, site not specified: Secondary | ICD-10-CM | POA: Diagnosis not present

## 2020-09-07 LAB — POCT URINALYSIS DIP (MANUAL ENTRY)
Bilirubin, UA: NEGATIVE
Blood, UA: NEGATIVE
Glucose, UA: NEGATIVE mg/dL
Ketones, POC UA: NEGATIVE mg/dL
Nitrite, UA: NEGATIVE
Protein Ur, POC: NEGATIVE mg/dL
Spec Grav, UA: 1.02 (ref 1.010–1.025)
Urobilinogen, UA: 0.2 E.U./dL
pH, UA: 8.5 — AB (ref 5.0–8.0)

## 2020-09-07 MED ORDER — CEPHALEXIN 500 MG PO CAPS: 500.0000 mg | ORAL_CAPSULE | Freq: Two times a day (BID) | ORAL | 0 refills | Status: AC

## 2020-09-07 NOTE — ED Triage Notes (Signed)
Patient c/o dysuria x 1 week.   Patient endorses "painful at the end of urination" and "cloudy urine".   Patient denies ABD pain, Back Pain, hematuria, increased frequency, and urgency.   Patient has used OTC Pyridium with no relief of symptoms.

## 2020-09-07 NOTE — Telephone Encounter (Signed)
Noted  

## 2020-09-07 NOTE — Telephone Encounter (Signed)
LMTCB. Will need to schedule pt for an appointment with one of the providers that are available this week.

## 2020-09-07 NOTE — ED Provider Notes (Signed)
Roderic Palau    CSN: [id number] Arrival date & time: 09/07/20  1138      History   Chief Complaint Chief Complaint  Patient presents with  . Dysuria    HPI Katherine Jimenez is a 70 y.o. female.   Patient presents with 1 week history of dysuria and cloudy urine.  She denies fever, chills, abdominal pain, flank pain, hematuria, or other symptoms.  Treatment attempted at home with Pyridium.  Her medical history includes hypertension, bipolar disorder, anemia, spinal stenosis, fibromyalgia, depression.  The history is provided by the patient and medical records.    Past Medical History:  Diagnosis Date  . Arthritis    back, knees, fingers  . Bipolar disorder ([place])   . Bunion, left   . Depression   . Diabetes mellitus    diet controlled.   . Hypertension   . Lower back pain    S/P fall  . Squamous cell skin cancer, face    [facility]  . Treadmill stress test negative for angina pectoris June 2013    Patient Active Problem List   Diagnosis Date Noted  . Hyperlipidemia associated with type 2 diabetes mellitus ([place]) 07/28/2020  . Clavicular enlargement 08/03/2019  . Cognitive changes 10/16/2018  . Osteoarthritis of right knee 06/04/2017  . Idiopathic scoliosis 12/06/2015  . Chest pain 08/29/2015  . Insomnia secondary to depression with anxiety 05/29/2015  . Palpitations 03/02/2015  . Fibromyalgia 01/30/2015  . B12 deficiency 12/23/2014  . Cystocele 08/24/2013  . Preoperative evaluation to rule out surgical contraindication 01/21/2013  . Overweight (BMI 25.0-29.9) 01/21/2013  . Degenerative lumbar spinal stenosis 09/24/2011  . Hypertension 08/31/2011  . Bipolar disorder ([place]) 08/31/2011  . Anemia 08/31/2011    Past Surgical History:  Procedure Laterality Date  . BLADDER AUGMENTATION    . BLADDER SURGERY    . CARDIAC CATHETERIZATION  03/05/12   no stents  . CATARACT EXTRACTION W/PHACO Right 07/19/2015   Procedure: CATARACT EXTRACTION PHACO AND INTRAOCULAR  LENS PLACEMENT ([place]) right eye;  Surgeon: Ronnell Freshwater, MD;  Location: [facility];  Service: Ophthalmology;  Laterality: Right;  BORDERLINE DIABETIC - oral meds  . CATARACT EXTRACTION W/PHACO Left 05/28/2017   Procedure: CATARACT EXTRACTION PHACO AND INTRAOCULAR LENS PLACEMENT ([place]) LEFT DIABETIC;  Surgeon: Leandrew Koyanagi, MD;  Location: [facility];  Service: Ophthalmology;  Laterality: Left;  Diabetic - oal meds  . JOINT REPLACEMENT  2012   bilateral hip  . KNEE ARTHROSCOPY    . TOTAL HIP ARTHROPLASTY  2012  . TOTAL KNEE ARTHROPLASTY Right 06/04/2017   Procedure: TOTAL KNEE ARTHROPLASTY;  Surgeon: Lovell Sheehan, MD;  Location: [facility];  Service: Orthopedics;  Laterality: Right;  . TUBAL LIGATION    . VAGINAL DELIVERY     2    OB History   No obstetric history on file.      Home Medications    Prior to Admission medications   Medication Sig Start Date End Date Taking? Authorizing Provider  atorvastatin (LIPITOR) 10 MG tablet Take 1 tablet (10 mg total) by mouth daily. 04/23/20  Yes Crecencio Mc, MD  Biotin 5 MG CAPS Take 2 capsules by mouth 2 (two) times daily.    Yes [provider]  buPROPion (WELLBUTRIN XL) 300 MG 24 hr tablet TAKE ONE TABLET BY MOUTH DAILY 08/23/20  Yes Crecencio Mc, MD  cephALEXin (KEFLEX) 500 MG capsule Take 1 capsule (500 mg total) by mouth 2 (two) times daily  for 5 days. 09/07/20 09/12/20 Yes Sharion Balloon, NP  Cholecalciferol (VITAMIN D3) 1000 units CAPS Take 1,000 Units by mouth daily.    Yes [provider]  cyanocobalamin (,VITAMIN B-12,) 1000 MCG/ML injection Inject 1 mL (1,000 mcg total) into the muscle every 30 (thirty) days. 04/23/20  Yes Crecencio Mc, MD  DULoxetine (CYMBALTA) 30 MG capsule TAKE ONE CAPSULE BY MOUTH DAILY 06/22/20  Yes Crecencio Mc, MD  lamoTRIgine (LAMICTAL) 200 MG tablet TAKE ONE TABLET BY MOUTH TWICE A DAY 06/15/20  Yes Crecencio Mc, MD  losartan (COZAAR) 25 MG tablet  TAKE TWO TABLETS BY MOUTH ONCE DAILY 10/27/19  Yes Crecencio Mc, MD  metFORMIN (GLUCOPHAGE XR) 750 MG 24 hr tablet Take 1 tablet (750 mg total) by mouth daily with breakfast. 04/16/20  Yes Crecencio Mc, MD  omeprazole (PRILOSEC) 40 MG capsule TAKE ONE CAPSULE BY MOUTH DAILY 01/27/20  Yes Crecencio Mc, MD  TRULICITY 5.46 FK/8.1EX SOPN INJECT 0.75 MG UNDER THE SKIN ONCE WEEKLY 06/22/20  Yes Crecencio Mc, MD  vitamin E 1000 UNIT capsule Take 1,000 Units by mouth daily.   Yes [provider]  acetaminophen (TYLENOL) 500 MG tablet Take 1,000 mg by mouth every 6 (six) hours as needed (FOR PAIN.).    [provider]  blood glucose meter kit and supplies Dispense Contour next bayer meter. E11.9 To check blood glucose  Once a day. 08/31/15   Crecencio Mc, MD  diphenhydramine-acetaminophen (TYLENOL PM) 25-500 MG TABS tablet Take 1 tablet by mouth at bedtime as needed.    [provider]  glucose blood (FREESTYLE LITE) test strip Use once daily  as instructed to test blood sugar E 11.9 04/29/14   Crecencio Mc, MD  Lancets (FREESTYLE) lancets Use once daily to check blood sugars  E11.9 04/29/14   Crecencio Mc, MD  LORazepam (ATIVAN) 0.5 MG tablet TAKE ONE TABLET BY MOUTH TWICE A DAY AS NEEDED FOR ANXIETY 07/28/20   Crecencio Mc, MD  Syringe, Disposable, 1 ML MISC For use with B12 injections 12/23/14   Crecencio Mc, MD  tiZANidine (ZANAFLEX) 2 MG tablet Take 1 tablet (2 mg total) by mouth in the morning and at bedtime. Patient not taking: No sig reported 05/06/20   Crecencio Mc, MD    Family History Family History  Problem Relation Age of Onset  . Heart disease Mother   . Stroke Mother   . Heart disease Father   . Dementia Father   . Heart disease Maternal Grandmother   . Heart disease Maternal Grandfather   . Heart disease Paternal Grandmother   . Heart disease Paternal Grandfather   . Breast cancer Maternal Aunt     Social History Social History    Tobacco Use  . Smoking status: Never Smoker  . Smokeless tobacco: Never Used  Vaping Use  . Vaping Use: Never used  Substance Use Topics  . Alcohol use: Yes    Alcohol/week: 1.0 standard drink    Types: 1 Cans of beer per week    Comment: occassionally  . Drug use: No     Allergies   Thimerosal   Review of Systems Review of Systems  Constitutional: Negative for chills and fever.  Respiratory: Negative for cough and shortness of breath.   Cardiovascular: Negative for chest pain and palpitations.  Gastrointestinal: Negative for abdominal pain and vomiting.  Genitourinary: Positive for dysuria. Negative for flank pain, frequency, hematuria and  urgency.  Skin: Negative for color change and rash.  All other systems reviewed and are negative.    Physical Exam Triage Vital Signs ED Triage Vitals  Enc Vitals Group     BP      Pulse      Resp      Temp      Temp src      SpO2      Weight      Height      Head Circumference      Peak Flow      Pain Score      Pain Loc      Pain Edu?      Excl. in [place]?    No data found.  Updated Vital Signs BP (!) 151/95 (BP Location: Left Arm)   Pulse 98   Temp 98.7 F (37.1 C) (Oral)   Resp 15   SpO2 93%   Visual Acuity Right Eye Distance:   Left Eye Distance:   Bilateral Distance:    Right Eye Near:   Left Eye Near:    Bilateral Near:     Physical Exam Vitals and nursing note reviewed.  Constitutional:      General: She is not in acute distress.    Appearance: She is well-developed. She is not ill-appearing.  HENT:     Head: Normocephalic and atraumatic.     Mouth/Throat:     Mouth: Mucous membranes are moist.  Eyes:     Conjunctiva/sclera: Conjunctivae normal.  Cardiovascular:     Rate and Rhythm: Normal rate and regular rhythm.     Heart sounds: Normal heart sounds.  Pulmonary:     Effort: Pulmonary effort is normal. No respiratory distress.     Breath sounds: Normal breath sounds.  Abdominal:      General: Bowel sounds are normal.     Palpations: Abdomen is soft.     Tenderness: There is no abdominal tenderness. There is no right CVA tenderness, left CVA tenderness, guarding or rebound.  Musculoskeletal:     Cervical back: Neck supple.  Skin:    General: Skin is warm and dry.  Neurological:     General: No focal deficit present.     Mental Status: She is alert and oriented to person, place, and time.     Gait: Gait normal.  Psychiatric:        Mood and Affect: Mood normal.        Behavior: Behavior normal.      UC Treatments / Results  Labs (all labs ordered are listed, but only abnormal results are displayed) Labs Reviewed  POCT URINALYSIS DIP (MANUAL ENTRY) - Abnormal; Notable for the following components:      Result Value   pH, UA 8.5 (*)    Leukocytes, UA Large (3+) (*)    All other components within normal limits  URINE CULTURE    EKG   Radiology No results found.  Procedures Procedures (including critical care time)  Medications Ordered in UC Medications - No data to display  Initial Impression / Assessment and Plan / UC Course  I have reviewed the triage vital signs and the nursing notes.  Pertinent labs & imaging results that were available during my care of the patient were reviewed by me and considered in my medical decision making (see chart for details).   UTI.  Urine culture pending.  Treating with Keflex.  Discussed with patient that we will call her if the  culture shows need to change or discontinue the antibiotic.  Instructed her to follow-up with her PCP if her symptoms are not improving.  She agrees to plan of care.   Final Clinical Impressions(s) / UC Diagnoses   Final diagnoses:  Urinary tract infection without hematuria, site unspecified     Discharge Instructions     Take the antibiotic as directed.  The urine culture is pending.  We will call you if it shows the need to change or discontinue your antibiotic.    Follow up with  your primary care provider if your symptoms are not improving.        ED Prescriptions    Medication Sig Dispense Auth. Provider   cephALEXin (KEFLEX) 500 MG capsule Take 1 capsule (500 mg total) by mouth 2 (two) times daily for 5 days. 10 capsule Sharion Balloon, NP     PDMP not reviewed this encounter.   Sharion Balloon, NP 09/07/20 1225

## 2020-09-07 NOTE — Telephone Encounter (Signed)
Pt called and I offered her an appt and she states that she is just going to go to the UC across the [place] from [place]. FYI

## 2020-09-07 NOTE — Discharge Instructions (Signed)
Take the antibiotic as directed.  The urine culture is pending.  We will call you if it shows the need to change or discontinue your antibiotic.    Follow up with your primary care provider if your symptoms are not improving.    

## 2020-09-09 ENCOUNTER — Telehealth: Payer: Self-pay | Admitting: Internal Medicine

## 2020-09-09 DIAGNOSIS — K649 Unspecified hemorrhoids: Secondary | ICD-10-CM

## 2020-09-09 LAB — URINE CULTURE: Culture: 60000 — AB

## 2020-09-09 NOTE — Telephone Encounter (Signed)
Spoke with pt and she stated that she would like to have a referral to a proctologist for external hemorrhoids. Pt stated that she has been dealing with them for a long time and just does not want to come to her PCP for this reason.

## 2020-09-09 NOTE — Telephone Encounter (Signed)
Pt called and would like a referral to a proctologist that Dr. Derrel Nip recommends in [place]

## 2020-09-10 NOTE — Telephone Encounter (Signed)
Attempted to call patient. Call was disconnected

## 2020-09-10 NOTE — Addendum Note (Signed)
Addended by: Crecencio Mc on: 09/10/2020 10:35 AM   Modules accepted: Orders

## 2020-09-10 NOTE — Telephone Encounter (Signed)
Referral to [facility] in progress

## 2020-09-13 ENCOUNTER — Ambulatory Visit: Payer: Medicare HMO | Admitting: Dermatology

## 2020-09-14 ENCOUNTER — Other Ambulatory Visit: Payer: Self-pay | Admitting: Internal Medicine

## 2020-09-14 NOTE — Telephone Encounter (Signed)
Pt stated that she is better and would like to cancel the referral to [facility].

## 2020-09-20 ENCOUNTER — Other Ambulatory Visit: Payer: Self-pay | Admitting: Internal Medicine

## 2020-09-21 ENCOUNTER — Ambulatory Visit: Payer: Medicare HMO | Admitting: Dermatology

## 2020-10-12 DIAGNOSIS — M48062 Spinal stenosis, lumbar region with neurogenic claudication: Secondary | ICD-10-CM | POA: Diagnosis not present

## 2020-10-12 DIAGNOSIS — M5416 Radiculopathy, lumbar region: Secondary | ICD-10-CM | POA: Diagnosis not present

## 2020-10-22 ENCOUNTER — Other Ambulatory Visit: Payer: Self-pay | Admitting: Internal Medicine

## 2020-11-10 DIAGNOSIS — Z961 Presence of intraocular lens: Secondary | ICD-10-CM | POA: Diagnosis not present

## 2020-11-13 DIAGNOSIS — T1511XA Foreign body in conjunctival sac, right eye, initial encounter: Secondary | ICD-10-CM | POA: Diagnosis not present

## 2020-12-03 ENCOUNTER — Ambulatory Visit: Payer: Medicare HMO

## 2020-12-08 ENCOUNTER — Ambulatory Visit: Payer: Medicare HMO

## 2020-12-09 ENCOUNTER — Other Ambulatory Visit: Payer: Self-pay | Admitting: Internal Medicine

## 2020-12-10 NOTE — Telephone Encounter (Signed)
RX Refill:ativan and lamictal Last Seen:07-28-20 Last ordered:07-28-20 and 06-15-20

## 2020-12-14 ENCOUNTER — Ambulatory Visit (INDEPENDENT_AMBULATORY_CARE_PROVIDER_SITE_OTHER): Payer: Medicare HMO

## 2020-12-14 VITALS — Ht 61.0 in | Wt 159.0 lb

## 2020-12-14 DIAGNOSIS — Z0000 Encounter for general adult medical examination without abnormal findings: Secondary | ICD-10-CM

## 2020-12-14 DIAGNOSIS — M19072 Primary osteoarthritis, left ankle and foot: Secondary | ICD-10-CM | POA: Diagnosis not present

## 2020-12-14 DIAGNOSIS — Z1231 Encounter for screening mammogram for malignant neoplasm of breast: Secondary | ICD-10-CM | POA: Diagnosis not present

## 2020-12-14 DIAGNOSIS — M19071 Primary osteoarthritis, right ankle and foot: Secondary | ICD-10-CM | POA: Diagnosis not present

## 2020-12-14 NOTE — Progress Notes (Addendum)
Subjective:   Katherine Jimenez is a 70 y.o. female who presents for Medicare Annual (Subsequent) preventive examination.  Review of Systems    No ROS.  Medicare Wellness Virtual Visit.  Visual/audio telehealth visit, UTA vital signs.   See social history for additional risk factors.   Cardiac Risk Factors include: advanced age (>41mn, >>39women);hypertension;diabetes mellitus     Objective:    Today's Vitals   12/14/20 1539  Weight: 159 lb (72.1 kg)  Height: '5\' 1"'  (1.549 m)   Body mass index is 30.04 kg/m.  Advanced Directives 12/14/2020 12/03/2019 10/14/2018 08/19/2018 06/04/2017 05/28/2017 05/23/2017  Does Patient Have a Medical Advance Directive? No No No No No No No  Would patient like information on creating a medical advance directive? No - Patient declined No - Patient declined No - Patient declined - Yes (Inpatient - patient requests chaplain consult to create a medical advance directive) No - Patient declined -    Current Medications (verified) Outpatient Encounter Medications as of 12/14/2020  Medication Sig   lamoTRIgine (LAMICTAL) 200 MG tablet TAKE ONE TABLET BY MOUTH TWICE A DAY   LORazepam (ATIVAN) 0.5 MG tablet TAKE ONE TABLET BY MOUTH TWICE A DAY AS NEEDED FOR ANXIETY   acetaminophen (TYLENOL) 500 MG tablet Take 1,000 mg by mouth every 6 (six) hours as needed (FOR PAIN.).   atorvastatin (LIPITOR) 10 MG tablet Take 1 tablet (10 mg total) by mouth daily.   Biotin 5 MG CAPS Take 2 capsules by mouth 2 (two) times daily.    blood glucose meter kit and supplies Dispense Contour next bayer meter. E11.9 To check blood glucose  Once a day.   buPROPion (WELLBUTRIN XL) 300 MG 24 hr tablet TAKE ONE TABLET BY MOUTH DAILY   Cholecalciferol (VITAMIN D3) 1000 units CAPS Take 1,000 Units by mouth daily.    cyanocobalamin (,VITAMIN B-12,) 1000 MCG/ML injection Inject 1 mL (1,000 mcg total) into the muscle every 30 (thirty) days.   diphenhydramine-acetaminophen (TYLENOL PM) 25-500 MG  TABS tablet Take 1 tablet by mouth at bedtime as needed.   DULoxetine (CYMBALTA) 30 MG capsule TAKE ONE CAPSULE BY MOUTH DAILY   glucose blood (FREESTYLE LITE) test strip Use once daily  as instructed to test blood sugar E 11.9   Lancets (FREESTYLE) lancets Use once daily to check blood sugars  E11.9   losartan (COZAAR) 25 MG tablet TAKE TWO TABLETS BY MOUTH ONCE DAILY   metFORMIN (GLUCOPHAGE XR) 750 MG 24 hr tablet Take 1 tablet (750 mg total) by mouth daily with breakfast.   omeprazole (PRILOSEC) 40 MG capsule TAKE ONE CAPSULE BY MOUTH DAILY   Syringe, Disposable, 1 ML MISC For use with B12 injections   tiZANidine (ZANAFLEX) 2 MG tablet Take 1 tablet (2 mg total) by mouth in the morning and at bedtime. (Patient not taking: No sig reported)   TRULICITY 08.65MHQ/4.6NGSOPN INJECT 0.75 MG UNDER THE SKIN ONCE WEEKLY   vitamin E 1000 UNIT capsule Take 1,000 Units by mouth daily.   No facility-administered encounter medications on file as of 12/14/2020.    Allergies (verified) Thimerosal   History: Past Medical History:  Diagnosis Date   Arthritis    back, knees, fingers   Bipolar disorder ([place]    Bunion, left    Depression    Diabetes mellitus    diet controlled.    Hypertension    Lower back pain    S/P fall   Squamous cell skin cancer, face  [facility]   Treadmill stress test negative for angina pectoris June 2013   Past Surgical History:  Procedure Laterality Date   BLADDER AUGMENTATION     BLADDER SURGERY     CARDIAC CATHETERIZATION  03/05/12   no stents   CATARACT EXTRACTION W/PHACO Right 07/19/2015   Procedure: CATARACT EXTRACTION PHACO AND INTRAOCULAR LENS PLACEMENT ([place]) right eye;  Surgeon: Ronnell Freshwater, MD;  Location: [place];  Service: Ophthalmology;  Laterality: Right;  BORDERLINE DIABETIC - oral meds   CATARACT EXTRACTION W/PHACO Left 05/28/2017   Procedure: CATARACT EXTRACTION PHACO AND INTRAOCULAR LENS PLACEMENT ([place]) LEFT DIABETIC;   Surgeon: Leandrew Koyanagi, MD;  Location: [place];  Service: Ophthalmology;  Laterality: Left;  Diabetic - oal meds   JOINT REPLACEMENT  2012   bilateral hip   KNEE ARTHROSCOPY     TOTAL HIP ARTHROPLASTY  2012   TOTAL KNEE ARTHROPLASTY Right 06/04/2017   Procedure: TOTAL KNEE ARTHROPLASTY;  Surgeon: Lovell Sheehan, MD;  Location: [facility];  Service: Orthopedics;  Laterality: Right;   TUBAL LIGATION     VAGINAL DELIVERY     2   Family History  Problem Relation Age of Onset   Heart disease Mother    Stroke Mother    Heart disease Father    Dementia Father    Heart disease Maternal Grandmother    Heart disease Maternal Grandfather    Heart disease Paternal Grandmother    Heart disease Paternal Grandfather    Breast cancer Maternal Aunt    Social History   Socioeconomic History   Marital status: Married    Spouse name: Not on file   Number of children: Not on file   Years of education: Not on file   Highest education level: Not on file  Occupational History   Not on file  Tobacco Use   Smoking status: Never   Smokeless tobacco: Never  Vaping Use   Vaping Use: Never used  Substance and Sexual Activity   Alcohol use: Yes    Alcohol/week: 1.0 standard drink    Types: 1 Cans of beer per week    Comment: occassionally   Drug use: No   Sexual activity: Not on file  Other Topics Concern   Not on file  Social History Narrative   Lives in [place] with husband.         Social Determinants of Health   Financial Resource Strain: Low Risk    Difficulty of Paying Living Expenses: Not hard at all  Food Insecurity: No Food Insecurity   Worried About Charity fundraiser in the Last Year: Never true   [place] in the Last Year: Never true  Transportation Needs: No Transportation Needs   Lack of Transportation (Medical): No   Lack of Transportation (Non-Medical): No  Physical Activity: Sufficiently Active   Days of Exercise per Week: 5 days   Minutes  of Exercise per Session: 60 min  Stress: No Stress Concern Present   Feeling of Stress : Not at all  Social Connections: Unknown   Frequency of Communication with Friends and Family: More than three times a week   Frequency of Social Gatherings with Friends and Family: More than three times a week   Attends Religious Services: Not on file   Active Member of Clubs or Organizations: Not on file   Attends Archivist Meetings: Not on file   Marital Status: Not on file    Tobacco Counseling  Counseling given: Not Answered   Clinical Intake:  Pre-visit preparation completed: Yes        Diabetes: Yes  How often do you need to have someone help you when you read instructions, pamphlets, or other written materials from your doctor or pharmacy?: 1 - Never  Nutrition Risk Assessment: Has the patient had any N/V/D within the last 2 months?  No  Does the patient have any non-healing wounds?  No  Has the patient had any unintentional weight loss or weight gain?  No   Financial Strains and Diabetes Management: Does the patient want to be seen by Chronic Care Management for management of their diabetes?  No  Would the patient like to be referred to a Nutritionist or for Diabetic Management?  No   Interpreter Needed?: No      Activities of Daily Living In your present state of health, do you have any difficulty performing the following activities: 12/14/2020  Hearing? N  Vision? N  Difficulty concentrating or making decisions? N  Walking or climbing stairs? N  Dressing or bathing? N  Doing errands, shopping? N  Preparing Food and eating ? N  Using the Toilet? N  In the past six months, have you accidently leaked urine? N  Do you have problems with loss of bowel control? N  Managing your Medications? N  Managing your Finances? N  Housekeeping or managing your Housekeeping? N  Some recent data might be hidden    Patient Care Team: Crecencio Mc, MD as PCP - General  (Internal Medicine)  Indicate any recent Medical Services you may have received from other than [facility] providers in the past year (date may be approximate).     Assessment:   This is a routine wellness examination for Shia.  I connected with Janete today by telephone and verified that I am speaking with the correct person using two identifiers. Location patient: home Location provider: work Persons participating in the virtual visit: patient, Marine scientist.    I discussed the limitations, risks, security and privacy concerns of performing an evaluation and management service by telephone and the availability of in person appointments. The patient expressed understanding and verbally consented to this telephonic visit.    Interactive audio and video telecommunications were attempted between this provider and patient, however failed, due to patient having technical difficulties OR patient did not have access to video capability.  We continued and completed visit with audio only.  Some vital signs may be absent or patient reported.   Hearing/Vision screen Hearing Screening - Comments:: Patient is able to hear conversational tones without difficulty.  No issues reported. Vision Screening - Comments:: Wears corrective lenses They have seen their ophthalmologist in the last 12 months.    Dietary issues and exercise activities discussed: Current Exercise Habits: Home exercise routine, Time (Minutes): 60, Frequency (Times/Week): 5, Weekly Exercise (Minutes/Week): 300, Intensity: Mild Regular diet Good water intake   Goals Addressed               This Visit's Progress     Patient Stated     I would like to increase water aerobics up to 5 days (pt-stated)   On track      Depression Screen PHQ 2/9 Scores 12/14/2020 07/28/2020 04/16/2020 12/03/2019 07/15/2019 10/16/2018 10/14/2018  PHQ - 2 Score '1 3 2 ' 0 '4 2 1  ' PHQ- 9 Score - 9 5 - 9 4 -    Fall Risk Fall Risk  12/14/2020 07/28/2020 04/16/2020  12/03/2019 08/01/2019  Falls in the past year? 0 1 0 0 1  Number falls in past yr: - 1 0 0 1  Injury with Fall? - 0 0 - 0  Follow up Falls evaluation completed Falls evaluation completed Falls evaluation completed Falls evaluation completed Falls evaluation completed    [place]: Adequate lighting in your home to reduce risk of falls? Yes   ASSISTIVE DEVICES UTILIZED TO PREVENT FALLS: Use of a cane, walker or w/c? No   TIMED UP AND GO: Was the test performed? No .   Cognitive Function:  Patient is alert and oriented x3.  100 percent independent with ADLs. Manages her own finances and lives alone.  Notes Dx of cognitive decline. Followed by pcp.    6CIT Screen 10/14/2018  What Year? 0 points  What month? 0 points  What time? 0 points  Count back from 20 0 points  Months in reverse 0 points  Repeat phrase 0 points  Total Score 0   Immunizations Immunization History  Administered Date(s) Administered   Influenza, High Dose Seasonal PF 11/20/2018   Influenza-Unspecified 12/02/2013, 12/31/2014, 12/27/2015, 01/03/2017, 11/24/2017, 12/03/2019   PFIZER(Purple Top)SARS-COV-2 Vaccination 05/26/2019, 06/16/2019, 03/17/2020   Pneumococcal Conjugate-13 11/20/2018   Pneumococcal Polysaccharide-23 04/22/2015   Tdap 11/20/2018   Zoster Recombinat (Shingrix) 11/20/2018   Health Maintenance Health Maintenance  Topic Date Due   Zoster Vaccines- Shingrix (2 of 2) 01/15/2019   COVID-19 Vaccine (4 - Booster for Pfizer series) 12/30/2020 (Originally 07/16/2020)   PNA vac Low Risk Adult (2 of 2 - PPSV23) 03/03/2021 (Originally 04/21/2020)   INFLUENZA VACCINE  07/01/2021 (Originally 11/01/2020)   MAMMOGRAM  01/04/2021   HEMOGLOBIN A1C  01/27/2021   FOOT EXAM  04/16/2021   OPHTHALMOLOGY EXAM  06/15/2021   COLONOSCOPY (Pts 45-57yr Insurance coverage will need to be confirmed)  01/17/2026   TETANUS/TDAP  11/19/2028   DEXA SCAN  Completed   Hepatitis C Screening   Completed   HPV VACCINES  Aged Out   Mammogram- plans to schedule. Ordered today.   Lung Cancer Screening: (Low Dose CT Chest recommended if Age 69-80years, 30 pack-year currently smoking OR have quit w/in 15years.) does not qualify.   Vision Screening: Recommended annual ophthalmology exams for early detection of glaucoma and other disorders of the eye. Is the patient up to date with their annual eye exam?  Yes   Dental Screening: Recommended annual dental exams for proper oral hygiene  Community Resource Referral / Chronic Care Management: CRR required this visit?  No   CCM required this visit?  No      Plan:   Keep all routine maintenance appointments.   I have personally reviewed and noted the following in the patient's chart:   Medical and social history Use of alcohol, tobacco or illicit drugs  Current medications and supplements including opioid prescriptions. Not taking opioid.  Functional ability and status Nutritional status Physical activity Advanced directives List of other physicians Hospitalizations, surgeries, and ER visits in previous 12 months Vitals Screenings to include cognitive, depression, and falls Referrals and appointments  In addition, I have reviewed and discussed with patient certain preventive protocols, quality metrics, and best practice recommendations. A written personalized care plan for preventive services as well as general preventive health recommendations were provided to patient via mychart.     OBrien-Blaney, Leonardo Plaia L, LPN   [id number]   I have reviewed the above information and agree with above.  Deborra Medina, MD

## 2020-12-14 NOTE — Patient Instructions (Addendum)
Katherine Jimenez , Thank you for taking time to come for your Medicare Wellness Visit. I appreciate your ongoing commitment to your health goals. Please review the following plan we discussed and let me know if I can assist you in the future.   These are the goals we discussed:  Goals       Patient Stated     I would like to increase water aerobics up to 5 days (pt-stated)        This is a list of the screening recommended for you and due dates:  Health Maintenance  Topic Date Due   Zoster (Shingles) Vaccine (2 of 2) 01/15/2019   COVID-19 Vaccine (4 - Booster for Pfizer series) 12/30/2020*   Pneumonia vaccines (2 of 2 - PPSV23) 03/03/2021*   Flu Shot  07/01/2021*   Mammogram  01/04/2021   Hemoglobin A1C  01/27/2021   Complete foot exam   04/16/2021   Eye exam for diabetics  06/15/2021   Colon Cancer Screening  01/17/2026   Tetanus Vaccine  11/19/2028   DEXA scan (bone density measurement)  Completed   Hepatitis C Screening: USPSTF Recommendation to screen - Ages 32-79 yo.  Completed   HPV Vaccine  Aged Out  *Topic was postponed. The date shown is not the original due date.    Advanced directives: End of life planning; Advance aging; Advanced directives discussed.  Copy of current HCPOA/Living Will requested.    Conditions/risks identified: none new  Follow up in one year for your annual wellness visit    Preventive Care 65 Years and Older, Female Preventive care refers to lifestyle choices and visits with your health care provider that can promote health and wellness. What does preventive care include? A yearly physical exam. This is also called an annual well check. Dental exams once or twice a year. Routine eye exams. Ask your health care provider how often you should have your eyes checked. Personal lifestyle choices, including: Daily care of your teeth and gums. Regular physical activity. Eating a healthy diet. Avoiding tobacco and drug use. Limiting alcohol  use. Practicing safe sex. Taking low-dose aspirin every day. Taking vitamin and mineral supplements as recommended by your health care provider. What happens during an annual well check? The services and screenings done by your health care provider during your annual well check will depend on your age, overall health, lifestyle risk factors, and family history of disease. Counseling  Your health care provider may ask you questions about your: Alcohol use. Tobacco use. Drug use. Emotional well-being. Home and relationship well-being. Sexual activity. Eating habits. History of falls. Memory and ability to understand (cognition). Work and work Statistician. Reproductive health. Screening  You may have the following tests or measurements: Height, weight, and BMI. Blood pressure. Lipid and cholesterol levels. These may be checked every 5 years, or more frequently if you are over 11 years old. Skin check. Lung cancer screening. You may have this screening every year starting at age 13 if you have a 30-pack-year history of smoking and currently smoke or have quit within the past 15 years. Fecal occult blood test (FOBT) of the stool. You may have this test every year starting at age 76. Flexible sigmoidoscopy or colonoscopy. You may have a sigmoidoscopy every 5 years or a colonoscopy every 10 years starting at age 72. Hepatitis C blood test. Hepatitis B blood test. Sexually transmitted disease (STD) testing. Diabetes screening. This is done by checking your blood sugar (glucose) after you have not  eaten for a while (fasting). You may have this done every 1-3 years. Bone density scan. This is done to screen for osteoporosis. You may have this done starting at age 80. Mammogram. This may be done every 1-2 years. Talk to your health care provider about how often you should have regular mammograms. Talk with your health care provider about your test results, treatment options, and if necessary,  the need for more tests. Vaccines  Your health care provider may recommend certain vaccines, such as: Influenza vaccine. This is recommended every year. Tetanus, diphtheria, and acellular pertussis (Tdap, Td) vaccine. You may need a Td booster every 10 years. Zoster vaccine. You may need this after age 23. Pneumococcal 13-valent conjugate (PCV13) vaccine. One dose is recommended after age 35. Pneumococcal polysaccharide (PPSV23) vaccine. One dose is recommended after age 13. Talk to your health care provider about which screenings and vaccines you need and how often you need them. This information is not intended to replace advice given to you by your health care provider. Make sure you discuss any questions you have with your health care provider. Document Released: 04/16/2015 Document Revised: 12/08/2015 Document Reviewed: 01/19/2015 Elsevier Interactive Patient Education  2017 [employer] Prevention in the Home Falls can cause injuries. They can happen to people of all ages. There are many things you can do to make your home safe and to help prevent falls. What can I do on the outside of my home? Regularly fix the edges of walkways and driveways and fix any cracks. Remove anything that might make you trip as you walk through a door, such as a raised step or threshold. Trim any bushes or trees on the path to your home. Use bright outdoor lighting. Clear any walking paths of anything that might make someone trip, such as rocks or tools. Regularly check to see if handrails are loose or broken. Make sure that both sides of any steps have handrails. Any raised decks and porches should have guardrails on the edges. Have any leaves, snow, or ice cleared regularly. Use sand or salt on walking paths during winter. Clean up any spills in your garage right away. This includes oil or grease spills. What can I do in the bathroom? Use night lights. Install grab bars by the toilet and in the  tub and shower. Do not use towel bars as grab bars. Use non-skid mats or decals in the tub or shower. If you need to sit down in the shower, use a plastic, non-slip stool. Keep the floor dry. Clean up any water that spills on the floor as soon as it happens. Remove soap buildup in the tub or shower regularly. Attach bath mats securely with double-sided non-slip rug tape. Do not have throw rugs and other things on the floor that can make you trip. What can I do in the bedroom? Use night lights. Make sure that you have a light by your bed that is easy to reach. Do not use any sheets or blankets that are too big for your bed. They should not hang down onto the floor. Have a firm chair that has side arms. You can use this for support while you get dressed. Do not have throw rugs and other things on the floor that can make you trip. What can I do in the kitchen? Clean up any spills right away. Avoid walking on wet floors. Keep items that you use a lot in easy-to-reach places. If you need to reach  something above you, use a strong step stool that has a grab bar. Keep electrical cords out of the way. Do not use floor polish or wax that makes floors slippery. If you must use wax, use non-skid floor wax. Do not have throw rugs and other things on the floor that can make you trip. What can I do with my stairs? Do not leave any items on the stairs. Make sure that there are handrails on both sides of the stairs and use them. Fix handrails that are broken or loose. Make sure that handrails are as long as the stairways. Check any carpeting to make sure that it is firmly attached to the stairs. Fix any carpet that is loose or worn. Avoid having throw rugs at the top or bottom of the stairs. If you do have throw rugs, attach them to the floor with carpet tape. Make sure that you have a light switch at the top of the stairs and the bottom of the stairs. If you do not have them, ask someone to add them for  you. What else can I do to help prevent falls? Wear shoes that: Do not have high heels. Have rubber bottoms. Are comfortable and fit you well. Are closed at the toe. Do not wear sandals. If you use a stepladder: Make sure that it is fully opened. Do not climb a closed stepladder. Make sure that both sides of the stepladder are locked into place. Ask someone to hold it for you, if possible. Clearly mark and make sure that you can see: Any grab bars or handrails. First and last steps. Where the edge of each step is. Use tools that help you move around (mobility aids) if they are needed. These include: Canes. Walkers. Scooters. Crutches. Turn on the lights when you go into a dark area. Replace any light bulbs as soon as they burn out. Set up your furniture so you have a clear path. Avoid moving your furniture around. If any of your floors are uneven, fix them. If there are any pets around you, be aware of where they are. Review your medicines with your doctor. Some medicines can make you feel dizzy. This can increase your chance of falling. Ask your doctor what other things that you can do to help prevent falls. This information is not intended to replace advice given to you by your health care provider. Make sure you discuss any questions you have with your health care provider. Document Released: 01/14/2009 Document Revised: 08/26/2015 Document Reviewed: 04/24/2014 Elsevier Interactive Patient Education  2017 [employer].

## 2020-12-16 ENCOUNTER — Other Ambulatory Visit: Payer: Self-pay | Admitting: Internal Medicine

## 2020-12-29 ENCOUNTER — Other Ambulatory Visit: Payer: Self-pay | Admitting: Internal Medicine

## 2021-01-06 ENCOUNTER — Ambulatory Visit (INDEPENDENT_AMBULATORY_CARE_PROVIDER_SITE_OTHER): Payer: Medicare HMO | Admitting: Pharmacist

## 2021-01-06 ENCOUNTER — Ambulatory Visit (INDEPENDENT_AMBULATORY_CARE_PROVIDER_SITE_OTHER): Payer: Medicare HMO | Admitting: Internal Medicine

## 2021-01-06 ENCOUNTER — Other Ambulatory Visit: Payer: Self-pay

## 2021-01-06 VITALS — BP 132/86 | HR 92 | Temp 95.8°F | Ht 60.98 in | Wt 160.0 lb

## 2021-01-06 DIAGNOSIS — E785 Hyperlipidemia, unspecified: Secondary | ICD-10-CM

## 2021-01-06 DIAGNOSIS — M2550 Pain in unspecified joint: Secondary | ICD-10-CM

## 2021-01-06 DIAGNOSIS — D509 Iron deficiency anemia, unspecified: Secondary | ICD-10-CM

## 2021-01-06 DIAGNOSIS — I10 Essential (primary) hypertension: Secondary | ICD-10-CM

## 2021-01-06 DIAGNOSIS — E1169 Type 2 diabetes mellitus with other specified complication: Secondary | ICD-10-CM

## 2021-01-06 DIAGNOSIS — E119 Type 2 diabetes mellitus without complications: Secondary | ICD-10-CM

## 2021-01-06 DIAGNOSIS — Z23 Encounter for immunization: Secondary | ICD-10-CM

## 2021-01-06 DIAGNOSIS — M130 Polyarthritis, unspecified: Secondary | ICD-10-CM | POA: Diagnosis not present

## 2021-01-06 DIAGNOSIS — F418 Other specified anxiety disorders: Secondary | ICD-10-CM

## 2021-01-06 DIAGNOSIS — F5105 Insomnia due to other mental disorder: Secondary | ICD-10-CM

## 2021-01-06 DIAGNOSIS — R69 Illness, unspecified: Secondary | ICD-10-CM | POA: Diagnosis not present

## 2021-01-06 LAB — LIPID PANEL
Cholesterol: 199 mg/dL (ref 0–200)
HDL: 107.5 mg/dL (ref >39.00)
LDL Cholesterol: 72 mg/dL (ref 0–99)
NonHDL: 91.52
Total CHOL/HDL Ratio: 2
Triglycerides: 100 mg/dL (ref 0.0–149.0)
VLDL: 20 mg/dL (ref 0.0–40.0)

## 2021-01-06 LAB — COMPREHENSIVE METABOLIC PANEL
ALT: 12 U/L (ref 0–35)
AST: 14 U/L (ref 0–37)
Albumin: 4.4 g/dL (ref 3.5–5.2)
Alkaline Phosphatase: 97 U/L (ref 39–117)
BUN: 12 mg/dL (ref 6–23)
CO2: 27 mEq/L (ref 19–32)
Calcium: 9.8 mg/dL (ref 8.4–10.5)
Chloride: 99 mEq/L (ref 96–112)
Creatinine, Ser: 0.71 mg/dL (ref 0.40–1.20)
GFR: 87.08 mL/min (ref >60.00)
Glucose, Bld: 105 mg/dL — ABNORMAL HIGH (ref 70–99)
Potassium: 4.6 mEq/L (ref 3.5–5.1)
Sodium: 135 mEq/L (ref 135–145)
Total Bilirubin: 0.4 mg/dL (ref 0.2–1.2)
Total Protein: 6.8 g/dL (ref 6.0–8.3)

## 2021-01-06 LAB — SEDIMENTATION RATE: Sed Rate: 16 mm/hr (ref 0–30)

## 2021-01-06 LAB — FERRITIN: Ferritin: 5.6 ng/mL — ABNORMAL LOW (ref 10.0–291.0)

## 2021-01-06 LAB — URIC ACID: Uric Acid, Serum: 3.6 mg/dL (ref 2.4–7.0)

## 2021-01-06 LAB — CBC WITH DIFFERENTIAL/PLATELET
Basophils Absolute: 0.1 10*3/uL (ref 0.0–0.1)
Basophils Relative: 0.8 % (ref 0.0–3.0)
Eosinophils Absolute: 0.1 10*3/uL (ref 0.0–0.7)
Eosinophils Relative: 2.2 % (ref 0.0–5.0)
HCT: 32.7 % — ABNORMAL LOW (ref 36.0–46.0)
Hemoglobin: 10.4 g/dL — ABNORMAL LOW (ref 12.0–15.0)
Lymphocytes Relative: 30.7 % (ref 12.0–46.0)
Lymphs Abs: 1.9 10*3/uL (ref 0.7–4.0)
MCHC: 31.8 g/dL (ref 30.0–36.0)
MCV: 80.8 fl (ref 78.0–100.0)
Monocytes Absolute: 0.5 10*3/uL (ref 0.1–1.0)
Monocytes Relative: 8.2 % (ref 3.0–12.0)
Neutro Abs: 3.6 10*3/uL (ref 1.4–7.7)
Neutrophils Relative %: 58.1 % (ref 43.0–77.0)
Platelets: 377 10*3/uL (ref 150.0–400.0)
RBC: 4.05 Mil/uL (ref 3.87–5.11)
RDW: 16.7 % — ABNORMAL HIGH (ref 11.5–15.5)
WBC: 6.2 10*3/uL (ref 4.0–10.5)

## 2021-01-06 LAB — HEMOGLOBIN A1C: Hgb A1c MFr Bld: 6.6 % — ABNORMAL HIGH (ref 4.6–6.5)

## 2021-01-06 LAB — C-REACTIVE PROTEIN: CRP: <1 mg/dL (ref 0.5–20.0)

## 2021-01-06 MED ORDER — OZEMPIC (0.25 OR 0.5 MG/DOSE) 2 MG/1.5ML ~~LOC~~ SOPN: PEN_INJECTOR | SUBCUTANEOUS | 0 refills | Status: DC

## 2021-01-06 MED ORDER — MELOXICAM 15 MG PO TABS: 15.0000 mg | ORAL_TABLET | Freq: Every day | ORAL | 0 refills | Status: DC

## 2021-01-06 MED ORDER — TEMAZEPAM 22.5 MG PO CAPS: 22.5000 mg | ORAL_CAPSULE | Freq: Every evening | ORAL | 0 refills | Status: DC | PRN

## 2021-01-06 NOTE — Patient Instructions (Addendum)
For the arthritis:  Meloxicam 15 mg daily with food   INSTEAD OF NAPROXEN OR IBUPROFEN   PLUS  You can take 2000 mg of acetominophen (tylenol) every day safely  In divided doses (500 mg every 6 hours  Or 1000 mg every 12 hours.)   Labs today ,  if positive at all,  rheum consult    Stop the lorazepam and begin trial of temazepam 22.5 mg  for insomnia    I have referred  to our clinical pharmacist ,  Catie Darnelle Maffucci.  Catie helps me provide additional services to my patients who are on Medicare and dealing with  chronic diseases , like diabetes.  I do not expect this referral to cost you anything out of pocket, but I do think Catie will be able to help you get your diabetes under control and maximize your drug benefits.

## 2021-01-06 NOTE — Patient Instructions (Signed)
Start Ozempic 0.25 mg weekly for 4 weeks, then increase to 0.5 mg weekly. This medication may cause stomach upset, queasiness, or constipation, especially when first starting. This generally improves over time. Call our office if these symptoms occur and worsen, or if you have severe symptoms such as vomiting, diarrhea, or stomach pain.   Bring me a copy of your 2021 tax return and we can submit this application for assistance.   Take care!   Katherine Jimenez, PharmD  Visit Information   PATIENT GOALS:   Goals Addressed               This Visit's Progress     Patient Stated     Medication Monitoring (pt-stated)         Patient Goals/Self-Care Activities patient will:  - take medications as prescribed check glucose daily, document, and provide at future appointments collaborate with provider on medication access solutions        Consent to CCM Services: Katherine Jimenez was given information about Chronic Care Management services including:  CCM service includes personalized support from designated clinical staff supervised by her physician, including individualized plan of care and coordination with other care providers 24/7 contact phone numbers for assistance for urgent and routine care needs. Service will only be billed when office clinical staff spend 20 minutes or more in a month to coordinate care. Only one practitioner may furnish and bill the service in a calendar month. The patient may stop CCM services at any time (effective at the end of the month) by phone call to the office staff. The patient will be responsible for cost sharing (co-pay) of up to 20% of the service fee (after annual deductible is met).  Patient agreed to services and verbal consent obtained.   Patient verbalizes understanding of instructions provided today and agrees to view in [place].   Plan: Telephone follow up appointment with care management team member scheduled for:  6 weeks  Katherine Jimenez,  PharmD, [place], CPP Clinical Pharmacist [place] at [facility]: Patient Care Plan: Medication Management     Problem Identified: Diabetes, Hyperlipidemia, Hypertension      Long-Range Goal: Disease Progression Prevention   Start Date: 01/06/2021  This Visit's Progress: On track  Priority: High  Note:   Current Barriers:  Unable to independently afford treatment regimen Unable to achieve control of diabetes    Pharmacist Clinical Goal(s):  patient will verbalize ability to afford treatment regimen through collaboration with PharmD and provider.    Interventions: 1:1 collaboration with Crecencio Mc, MD regarding development and update of comprehensive plan of care as evidenced by provider attestation and co-signature Inter-disciplinary care team collaboration (see longitudinal plan of care) Comprehensive medication review performed; medication list updated in electronic medical record  Diabetes: Goal on track: NO. Controlled per last A1c but anticipate lack of control due to being off medication for several months; current treatment: metformin XR 132 mg daily, Trulicity 4.40 mg weekly - has been off several months due to cost ;  Current glucose readings: fasting glucose: 120-150s Discussed enhanced glycemic and weight management benefit of Ozempic vs Trulicity. Patient amenable to transition. Reviewed income. Patient qualifies for [employer] assistance through [employer]. Patient and provider portion of application completed today. Patient will bring back a copy of her 2021 tax return.  Start Ozempic 0.25 mg weekly for 4 weeks, then increase to 0.5 mg weekly. Patient verbalized understanding.   Hypertension:  Goal on track:  YES. Moderately well controlled; current treatment: losartan 50 mg daily Recommended to continue current regimen at this time  Hyperlipidemia:  Goal on track: YES. Moderately well controlled; current  treatment:atorvastatin 10 mg daily;  Goal LDL <70 may be appropriate given DM + risk factor of hypertension, especially if inflammatory condition present. Continue current regimen at this time.   Depression/Anxiety/:  Condition stable. Not addressed this visit. Current treatment:bupropion XL 300 mg daily, duloxetine 30 mg daily, lamotrigine 200 mg BID, temazepam 22.5 mg QPM added at this visit Continue current regimen at this time.   Osteoarthritis: Uncontrolled, lab work for assessment today. Current regimen: starting meloxicam 15 mg daily, tizanidine 2 mg BID PRN  Recommended to continue current regimen at this time  GERD: Did not discuss control today; current regimen: omeprazole 40 mg daily  Recommended to continue current regimen at this time  Supplements: Vitamin D, biotin, Vitamin E   Patient Goals/Self-Care Activities patient will:  - take medications as prescribed check glucose daily, document, and provide at future appointments collaborate with provider on medication access solutions  Follow Up Plan: Telephone follow up appointment with care management team member scheduled for: 6 weeks

## 2021-01-06 NOTE — Chronic Care Management (AMB) (Signed)
Chronic Care Management Pharmacy Note  01/06/2021 Name:  Katherine Jimenez MRN:  [id number] DOB:  05/15/1950   Subjective: Katherine Jimenez is an 70 y.o. year old female who is a primary patient of Tullo, Aris Everts, MD.  The CCM team was consulted for assistance with disease management and care coordination needs.    Engaged with patient face to face for initial visit in response to provider referral for pharmacy case management and/or care coordination services.   Consent to Services:  The patient was given the following information about Chronic Care Management services today, agreed to services, and gave verbal consent: 1. CCM service includes personalized support from designated clinical staff supervised by the primary care provider, including individualized plan of care and coordination with other care providers 2. 24/7 contact phone numbers for assistance for urgent and routine care needs. 3. Service will only be billed when office clinical staff spend 20 minutes or more in a month to coordinate care. 4. Only one practitioner may furnish and bill the service in a calendar month. 5.The patient may stop CCM services at any time (effective at the end of the month) by phone call to the office staff. 6. The patient will be responsible for cost sharing (co-pay) of up to 20% of the service fee (after annual deductible is met). Patient agreed to services and consent obtained.  Patient Care Team: Crecencio Mc, MD as PCP - General (Internal Medicine) De Hollingshead, RPH-CPP as Pharmacist (Pharmacist)  Objective:  Lab Results  Component Value Date   CREATININE 0.81 07/28/2020   CREATININE 0.83 06/04/2020   CREATININE 0.94 04/16/2020    Lab Results  Component Value Date   HGBA1C 6.4 07/28/2020   Last diabetic Eye exam:  Lab Results  Component Value Date/Time   HMDIABEYEEXA No Retinopathy 07/01/2019 12:00 AM    Last diabetic Foot exam:  Lab Results  Component Value Date/Time    HMDIABFOOTEX normal 06/10/2014 12:00 AM        Component Value Date/Time   CHOL 191 07/28/2020 0907   TRIG 81.0 07/28/2020 0907   HDL 96.90 07/28/2020 0907   CHOLHDL 2 07/28/2020 0907   VLDL 16.2 07/28/2020 0907   LDLCALC 78 07/28/2020 0907   LDLCALC 153 (H) 04/16/2020 1602   LDLDIRECT 112.0 10/13/2016 0822    Hepatic Function Latest Ref Rng & Units 07/28/2020 06/04/2020 04/16/2020  Total Protein 6.0 - 8.3 g/dL 7.0 7.1 7.5  Albumin 3.5 - 5.2 g/dL 4.4 4.3 -  AST 0 - 37 U/L '11 13 13  ' ALT 0 - 35 U/L '11 10 15  ' Alk Phosphatase 39 - 117 U/L 92 83 -  Total Bilirubin 0.2 - 1.2 mg/dL 0.4 0.4 0.5  Bilirubin, Direct 0.1 - 0.5 mg/dL - - -    Lab Results  Component Value Date/Time   TSH 0.93 04/16/2020 04:02 PM   TSH 2.05 07/08/2018 08:18 AM    CBC Latest Ref Rng & Units 08/19/2018 07/08/2018 06/06/2017  WBC 4.0 - 10.5 K/uL 8.4 5.3 6.4  Hemoglobin 12.0 - 15.0 g/dL 12.1 13.0 10.9(L)  Hematocrit 36.0 - 46.0 % 37.5 39.8 33.5(L)  Platelets 150 - 400 K/uL 287 323.0 241    Lab Results  Component Value Date/Time   VD25OH 43.82 08/15/2019 08:31 AM   VD25OH 50.70 01/17/2017 09:08 AM     Social History   Tobacco Use  Smoking Status Never  Smokeless Tobacco Never   BP Readings from Last 3 Encounters:  01/06/21  132/86  09/07/20 (!) 151/95  07/28/20 120/82   Pulse Readings from Last 3 Encounters:  01/06/21 92  09/07/20 98  07/28/20 (!) 102   Wt Readings from Last 3 Encounters:  01/06/21 160 lb (72.6 kg)  12/14/20 159 lb (72.1 kg)  07/28/20 159 lb 9.6 oz (72.4 kg)    Assessment: Review of patient past medical history, allergies, medications, health status, including review of consultants reports, laboratory and other test data, was performed as part of comprehensive evaluation and provision of chronic care management services.   SDOH:  (Social Determinants of Health) assessments and interventions performed:  SDOH Interventions    Flowsheet Row Most Recent Value  SDOH  Interventions   Financial Strain Interventions Other (Comment)  [manufacturer assistance]       CCM Care Plan  Allergies  Allergen Reactions   Thimerosal Itching    Medications Reviewed Today     Reviewed by De Hollingshead, RPH-CPP (Pharmacist) on 01/06/21 at 1214  Med List Status: <None>   Medication Order Taking? Sig Documenting Provider Last Dose Status Informant  acetaminophen (TYLENOL) 500 MG tablet [id number] No Take 1,000 mg by mouth every 6 (six) hours as needed (FOR PAIN.). [provider] Taking Active Self  atorvastatin (LIPITOR) 10 MG tablet [id number] No Take 1 tablet (10 mg total) by mouth daily. Crecencio Mc, MD Taking Active   Biotin 5 MG CAPS [id number] No Take 2 capsules by mouth 2 (two) times daily.  [provider] Taking Active Self  blood glucose meter kit and supplies [id number] No Dispense Contour next bayer meter. E11.9 To check blood glucose  Once a day. Crecencio Mc, MD Taking Active Self  buPROPion (WELLBUTRIN XL) 300 MG 24 hr tablet [id number] No TAKE ONE TABLET BY MOUTH DAILY Crecencio Mc, MD Taking Active   Cholecalciferol (VITAMIN D3) 1000 units CAPS [id number] No Take 1,000 Units by mouth daily.  [provider] Taking Active Self  cyanocobalamin (,VITAMIN B-12,) 1000 MCG/ML injection [id number] No Inject 1 mL (1,000 mcg total) into the muscle every 30 (thirty) days. Crecencio Mc, MD Taking Active   diphenhydramine-acetaminophen (TYLENOL PM) 25-500 MG TABS tablet [id number] No Take 1 tablet by mouth at bedtime as needed. [provider] Taking Active   DULoxetine (CYMBALTA) 30 MG capsule [id number] No TAKE ONE CAPSULE BY MOUTH DAILY Crecencio Mc, MD Taking Active   glucose blood (FREESTYLE LITE) test strip [id number] No Use once daily  as instructed to test blood sugar E 11.9 Crecencio Mc, MD Taking Active Self  lamoTRIgine (LAMICTAL) 200 MG tablet [id number] No TAKE ONE TABLET BY MOUTH TWICE A DAY Crecencio Mc, MD Taking Active   Lancets (FREESTYLE) lancets [id number] No Use once daily to check blood sugars  E11.9 Crecencio Mc, MD Taking Active Self  losartan (COZAAR) 25 MG tablet [id number] No TAKE TWO TABLETS BY MOUTH DAILY Crecencio Mc, MD Taking Active   meloxicam (MOBIC) 15 MG tablet [id number]  Take 1 tablet (15 mg total) by mouth daily. Crecencio Mc, MD  Active   metFORMIN (GLUCOPHAGE XR) 750 MG 24 hr tablet [id number] No Take 1 tablet (750 mg total) by mouth daily with breakfast. Crecencio Mc, MD Taking Active   omeprazole (PRILOSEC) 40 MG capsule [id number] No TAKE ONE CAPSULE BY MOUTH DAILY Crecencio Mc, MD Taking Active   Semaglutide,0.25 or 0.5MG/DOS, (OZEMPIC, 0.25 OR 0.5 MG/DOSE,) 2 MG/1.5ML SOPN [id number] Yes Inject 0.25 mg weekly for 4 weeks  then increase to 0.5 mg weekly De Hollingshead, RPH-CPP  Active   Syringe, Disposable, 1 ML MISC [id number] No For use with B12 injections Crecencio Mc, MD Taking Active Self  temazepam (RESTORIL) 22.5 MG capsule [id number]  Take 1 capsule (22.5 mg total) by mouth at bedtime as needed for sleep. Crecencio Mc, MD  Active   tiZANidine (ZANAFLEX) 2 MG tablet [id number] No Take 1 tablet (2 mg total) by mouth in the morning and at bedtime.  Patient taking differently: Take 2 mg by mouth as needed.   Crecencio Mc, MD Taking Active   vitamin E 1000 UNIT capsule [id number] No Take 1,000 Units by mouth daily. [provider] Taking Active Self            Patient Active Problem List   Diagnosis Date Noted   Hyperlipidemia associated with type 2 diabetes mellitus ([place]) 07/28/2020   Clavicular enlargement 08/03/2019   Cognitive changes 10/16/2018   Osteoarthritis of right knee 06/04/2017   Idiopathic scoliosis 12/06/2015   Chest pain 08/29/2015   Insomnia secondary to depression with anxiety 05/29/2015   Palpitations 03/02/2015   Fibromyalgia 01/30/2015   B12 deficiency 12/23/2014   Cystocele 08/24/2013    Preoperative evaluation to rule out surgical contraindication 01/21/2013   Overweight (BMI 25.0-29.9) 01/21/2013   Degenerative lumbar spinal stenosis 09/24/2011   Hypertension 08/31/2011   Bipolar disorder ([place]) 08/31/2011   Anemia 08/31/2011    Immunization History  Administered Date(s) Administered   Fluad Quad(high Dose 65+) 01/06/2021   Influenza, High Dose Seasonal PF 11/20/2018   Influenza-Unspecified 12/02/2013, 12/31/2014, 12/27/2015, 01/03/2017, 11/24/2017, 12/03/2019   PFIZER(Purple Top)SARS-COV-2 Vaccination 05/26/2019, 06/16/2019, 03/17/2020   Pneumococcal Conjugate-13 11/20/2018   Pneumococcal Polysaccharide-23 04/22/2015   Tdap 11/20/2018   Zoster Recombinat (Shingrix) 11/20/2018    Conditions to be addressed/monitored: HTN, HLD, and DMII  Care Plan : Medication Management  Updates made by De Hollingshead, RPH-CPP since 01/06/2021 12:00 AM     Problem: Diabetes, Hyperlipidemia, Hypertension      Long-Range Goal: Disease Progression Prevention   Start Date: 01/06/2021  This Visit's Progress: On track  Priority: High  Note:   Current Barriers:  Unable to independently afford treatment regimen Unable to achieve control of diabetes    Pharmacist Clinical Goal(s):  patient will verbalize ability to afford treatment regimen through collaboration with PharmD and provider.    Interventions: 1:1 collaboration with Crecencio Mc, MD regarding development and update of comprehensive plan of care as evidenced by provider attestation and co-signature Inter-disciplinary care team collaboration (see longitudinal plan of care) Comprehensive medication review performed; medication list updated in electronic medical record  Diabetes: Goal on track: NO. Controlled per last A1c but anticipate lack of control due to being off medication for several months; current treatment: metformin XR 024 mg daily, Trulicity 0.97 mg weekly - has been off several months due to cost ;   Current glucose readings: fasting glucose: 120-150s Discussed enhanced glycemic and weight management benefit of Ozempic vs Trulicity. Patient amenable to transition. Reviewed income. Patient qualifies for [employer] assistance through [employer]. Patient and provider portion of application completed today. Patient will bring back a copy of her 2021 tax return.  Start Ozempic 0.25 mg weekly for 4 weeks, then increase to 0.5 mg weekly. Patient verbalized understanding.   Hypertension:  Goal on track: YES. Moderately well controlled; current treatment: losartan 50 mg daily Recommended to continue current regimen at this time  Hyperlipidemia:  Goal on track:  YES. Moderately well controlled; current treatment:atorvastatin 10 mg daily;  Goal LDL <70 may be appropriate given DM + risk factor of hypertension, especially if inflammatory condition present. Continue current regimen at this time.   Depression/Anxiety/:  Condition stable. Not addressed this visit. Current treatment:bupropion XL 300 mg daily, duloxetine 30 mg daily, lamotrigine 200 mg BID, temazepam 22.5 mg QPM added at this visit Continue current regimen at this time.   Osteoarthritis: Uncontrolled, lab work for assessment today. Current regimen: starting meloxicam 15 mg daily, tizanidine 2 mg BID PRN  Recommended to continue current regimen at this time  GERD: Did not discuss control today; current regimen: omeprazole 40 mg daily  Recommended to continue current regimen at this time  Supplements: Vitamin D, biotin, Vitamin E   Patient Goals/Self-Care Activities patient will:  - take medications as prescribed check glucose daily, document, and provide at future appointments collaborate with provider on medication access solutions  Follow Up Plan: Telephone follow up appointment with care management team member scheduled for: 6 weeks      Medication Assistance: Application for Ozempic  medication assistance program. in  process.  Anticipated assistance start date TBD.  See plan of care for additional detail.  Patient's preferred pharmacy is:  [facility] [id number], [place] [id number] Phone: [phone] Fax: [phone]  Follow Up:  Patient agrees to Care Plan and Follow-up.  Plan: Telephone follow up appointment with care management team member scheduled for:  6 weeks  Catie Darnelle Maffucci, PharmD, [place], [place] Clinical Pharmacist [employer] at [employer] [phone]

## 2021-01-06 NOTE — Progress Notes (Signed)
Subjective:  Patient ID: Katherine Jimenez, female    DOB: 15-Nov-1950  Age: 70 y.o. MRN: [id number]  CC: The primary encounter diagnosis was Hyperlipidemia associated with type 2 diabetes mellitus ([place]). Diagnoses of Polyarthritis of multiple sites, Need for immunization against influenza, Iron deficiency anemia, unspecified iron deficiency anemia type, Polyarthralgia, and Insomnia secondary to depression with anxiety were also pertinent to this visit.  HPI Katherine Jimenez presents for follow up on multiple issues  Chief Complaint  Patient presents with   Arthritis    Pt having arthritic pain in her back, knees,feet and fingers   This visit occurred during the SARS-CoV-2 public health emergency.  Safety protocols were in place, including screening questions prior to the visit, additional usage of staff PPE, and extensive cleaning of exam room while observing appropriate contact time as indicated for disinfecting solutions.   Type 2 DM :   She does not feel well due to persistent diffuse joint pain , but she  is  exercising regularly .  She is checking  blood sugars less than once daily at variable times, usually only if she feels she may be having a hypoglycemic event. .  BS have been under 150 to 126 both fasting and  post prandially.  Denies any recent hypoglyemic events.  Taking   medications as directed. Following a carbohydrate modified diet 6 days per week. Denies numbness, burning and tingling of extremities. Appetite is good.    Chronic LBP:  had L3-4 ESI in July by [facility] .  Had transient relief,  having another one next week and going on a medical mission trip Oct 29th to [place] to assess and treat children in need.    Joint pain involving all joints of hands,   knuckles feeling swollen and tender for several months .  Has been taking tylenol arthritis, naproxen /ibuprofen without any change in pain.  Pain does not resolve after waking up and walking around .  Feet involved.   Podiatry seen (told she had arthritis")  Insomnia:  has been using 0.5 mg lorazepam qhs  for insomnia for the last several months,  however she  has been waking up repeatedly  at 3 am  and having trouble going back to sleep most nights  .  Increased stressors  due to family issues.  Lives alone. discussed trial of  temazepam as alternative to lorazepam      Outpatient Medications Prior to Visit  Medication Sig Dispense Refill   acetaminophen (TYLENOL) 500 MG tablet Take 1,000 mg by mouth every 6 (six) hours as needed (FOR PAIN.).     atorvastatin (LIPITOR) 10 MG tablet Take 1 tablet (10 mg total) by mouth daily. 90 tablet 3   Biotin 5 MG CAPS Take 2 capsules by mouth 2 (two) times daily.      blood glucose meter kit and supplies Dispense Contour next bayer meter. E11.9 To check blood glucose  Once a day. 1 each 0   buPROPion (WELLBUTRIN XL) 300 MG 24 hr tablet TAKE ONE TABLET BY MOUTH DAILY 30 tablet 1   Cholecalciferol (VITAMIN D3) 1000 units CAPS Take 1,000 Units by mouth daily.      cyanocobalamin (,VITAMIN B-12,) 1000 MCG/ML injection Inject 1 mL (1,000 mcg total) into the muscle every 30 (thirty) days. 5 mL 8   diphenhydramine-acetaminophen (TYLENOL PM) 25-500 MG TABS tablet Take 1 tablet by mouth at bedtime as needed.     DULoxetine (CYMBALTA) 30 MG capsule TAKE ONE  CAPSULE BY MOUTH DAILY 90 capsule 0   glucose blood (FREESTYLE LITE) test strip Use once daily  as instructed to test blood sugar E 11.9 100 each 3   lamoTRIgine (LAMICTAL) 200 MG tablet TAKE ONE TABLET BY MOUTH TWICE A DAY 180 tablet 1   Lancets (FREESTYLE) lancets Use once daily to check blood sugars  E11.9 100 each 3   losartan (COZAAR) 25 MG tablet TAKE TWO TABLETS BY MOUTH DAILY 180 tablet 1   metFORMIN (GLUCOPHAGE XR) 750 MG 24 hr tablet Take 1 tablet (750 mg total) by mouth daily with breakfast. 90 tablet 1   omeprazole (PRILOSEC) 40 MG capsule TAKE ONE CAPSULE BY MOUTH DAILY 90 capsule 3   Syringe, Disposable, 1 ML  MISC For use with B12 injections 25 each 3   tiZANidine (ZANAFLEX) 2 MG tablet Take 1 tablet (2 mg total) by mouth in the morning and at bedtime. (Patient taking differently: Take 2 mg by mouth as needed.) 30 tablet 1   vitamin E 1000 UNIT capsule Take 1,000 Units by mouth daily.     LORazepam (ATIVAN) 0.5 MG tablet TAKE ONE TABLET BY MOUTH TWICE A DAY AS NEEDED FOR ANXIETY 45 tablet 1   TRULICITY 9.24 QA/8.3MH SOPN INJECT 0.75 MG UNDER THE SKIN ONCE WEEKLY 2 mL 1   No facility-administered medications prior to visit.    Review of Systems;  Patient denies headache, fevers, malaise, unintentional weight loss, skin rash, eye pain, sinus congestion and sinus pain, sore throat, dysphagia,  hemoptysis , cough, dyspnea, wheezing, chest pain, palpitations, orthopnea, edema, abdominal pain, nausea, melena, diarrhea, constipation, flank pain, dysuria, hematuria, urinary  Frequency, nocturia, numbness, tingling, seizures,  Focal weakness, Loss of consciousness,  Tremor, insomnia, depression, anxiety, and suicidal ideation.      Objective:  BP 132/86   Pulse 92   Temp (!) 95.8 F (35.4 C)   Ht 5' 0.98" (1.549 m)   Wt 160 lb (72.6 kg)   SpO2 93%   BMI 30.25 kg/m   BP Readings from Last 3 Encounters:  01/06/21 132/86  09/07/20 (!) 151/95  07/28/20 120/82    Wt Readings from Last 3 Encounters:  01/06/21 160 lb (72.6 kg)  12/14/20 159 lb (72.1 kg)  07/28/20 159 lb 9.6 oz (72.4 kg)    General appearance: alert, cooperative and appears stated age Ears: normal TM's and external ear canals both ears Throat: lips, mucosa, and tongue normal; teeth and gums normal Neck: no adenopathy, no carotid bruit, supple, symmetrical, trachea midline and thyroid not enlarged, symmetric, no tenderness/mass/nodules Back: symmetric, no curvature. ROM normal. No CVA tenderness. Lungs: clear to auscultation bilaterally Heart: regular rate and rhythm, S1, S2 normal, no murmur, click, rub or gallop Abdomen:  soft, non-tender; bowel sounds normal; no masses,  no organomegaly Pulses: 2+ and symmetric Skin: Skin color, texture, turgor normal. No rashes or lesions Lymph nodes: Cervical, supraclavicular, and axillary nodes normal. MSK: bilateral Heberden's nodes on fingers of both hands. No synovitis ,Full ROM of hips, knees and shoulderw    Lab Results  Component Value Date   HGBA1C 6.6 (H) 01/06/2021   HGBA1C 6.4 07/28/2020   HGBA1C 6.6 (H) 04/16/2020    Lab Results  Component Value Date   CREATININE 0.71 01/06/2021   CREATININE 0.81 07/28/2020   CREATININE 0.83 06/04/2020    Lab Results  Component Value Date   WBC 6.2 01/06/2021   HGB 10.4 (L) 01/06/2021   HCT 32.7 (L) 01/06/2021   PLT 377.0  01/06/2021   GLUCOSE 105 (H) 01/06/2021   CHOL 199 01/06/2021   TRIG 100.0 01/06/2021   HDL 107.50 01/06/2021   LDLDIRECT 112.0 10/13/2016   LDLCALC 72 01/06/2021   ALT 12 01/06/2021   AST 14 01/06/2021   NA 135 01/06/2021   K 4.6 01/06/2021   CL 99 01/06/2021   CREATININE 0.71 01/06/2021   BUN 12 01/06/2021   CO2 27 01/06/2021   TSH 0.93 04/16/2020   INR 1.05 05/23/2017   HGBA1C 6.6 (H) 01/06/2021   MICROALBUR 2.0 04/16/2020    No results found.  Assessment & Plan:   Problem List Items Addressed This Visit       Unprioritized   Hyperlipidemia associated with type 2 diabetes mellitus ([place]) - Primary    Currently well-controlled on current medications .  hemoglobin A1c is at goal of less than 7.0 . Patient is reminded to schedule an annual eye exam and foot exam is normal today. Patient has no microalbuminuria. Patient is tolerating statin therapy  with atorvastatin 20 mg daily.Marland Kitchen  LDL is at goal and LFTs are normal.  Taking 25 mg losartan for BP.  No changes today continue metformin, ozempic atorvastatin and losartam   Lab Results  Component Value Date   HGBA1C 6.6 (H) 01/06/2021   Lab Results  Component Value Date   LABMICR See below: 03/17/2019   LABMICR See below:  02/03/2019   MICROALBUR 2.0 04/16/2020   MICROALBUR 1.9 08/15/2019    Lab Results  Component Value Date   CHOL 199 01/06/2021   HDL 107.50 01/06/2021   LDLCALC 72 01/06/2021   LDLDIRECT 112.0 10/13/2016   TRIG 100.0 01/06/2021   CHOLHDL 2 01/06/2021  . Lab Results  Component Value Date   ALT 12 01/06/2021   AST 14 01/06/2021   ALKPHOS 97 01/06/2021   BILITOT 0.4 01/06/2021         Relevant Orders   Hemoglobin A1c (Completed)   Comprehensive metabolic panel (Completed)   Lipid panel (Completed)   Insomnia secondary to depression with anxiety    She is having recurrent early waking with 0.5 mg lorazepam.  Trial of temazepam as alternative       Iron deficiency anemia    Recurrent iron deficiency.  IDA was worked up in 2017 with EGD/colonoscopy by Gaylyn Cheers with biopsies done of gastric muscosa which were positive for chronic gastritis but negative for Barrett's Esophagus.  Colonoscopy was reportedly normal (only EGD seems to have been copied into the media section in the chart from 2017). Will repeat workup with FOBT and add iron supplement .  Need colon report from 2018.       Polyarthralgia    Based on the labs done today which included ESR, CRP, uric acid,  CCP, RA,  ANA and  HLA B27 there is no evidence that the joint pain patient has been  been having is due to an underlying inflammatory or autoimmune form of arthritis.       Other Visit Diagnoses     Polyarthritis of multiple sites       Relevant Orders   AMB Referral to Community Care Coordinaton   Sedimentation rate (Completed)   HLA-B27 antigen (Completed)   ANA w/Reflex if Positive (Completed)   Uric acid (Completed)   Iron and TIBC (Completed)   Rheumatoid factor (Completed)   C-reactive protein (Completed)   Cyclic citrul peptide antibody, IgG (Completed)   CBC with Differential/Platelet (Completed)   Ferritin (Completed)   Need  for immunization against influenza       Relevant Orders   Flu  Vaccine QUAD High Dose(Fluad) (Completed)       I spent 40 mintutes dedicated to the care of this patient on the date of this encounter to include pre-visit review of her medical history,  Face-to-face time with the patient , and post visit review of piror EGD and colonscopy reports and ordering of testing and therapeutics.   Meds ordered this encounter  Medications   meloxicam (MOBIC) 15 MG tablet    Sig: Take 1 tablet (15 mg total) by mouth daily.    Dispense:  30 tablet    Refill:  0   temazepam (RESTORIL) 22.5 MG capsule    Sig: Take 1 capsule (22.5 mg total) by mouth at bedtime as needed for sleep.    Dispense:  30 capsule    Refill:  0    Medications Discontinued During This Encounter  Medication Reason   LORazepam (ATIVAN) 0.5 MG tablet     Follow-up: No follow-ups on file.   Crecencio Mc, MD

## 2021-01-07 LAB — RHEUMATOID FACTOR: Rheumatoid fact SerPl-aCnc: <14 IU/mL (ref <14)

## 2021-01-07 LAB — CYCLIC CITRUL PEPTIDE ANTIBODY, IGG: Cyclic Citrullin Peptide Ab: <16 UNITS

## 2021-01-07 LAB — HLA-B27 ANTIGEN: HLA-B27 Antigen: NEGATIVE

## 2021-01-08 DIAGNOSIS — M2550 Pain in unspecified joint: Secondary | ICD-10-CM | POA: Insufficient documentation

## 2021-01-08 DIAGNOSIS — U099 Post covid-19 condition, unspecified: Secondary | ICD-10-CM | POA: Insufficient documentation

## 2021-01-08 DIAGNOSIS — G8929 Other chronic pain: Secondary | ICD-10-CM | POA: Insufficient documentation

## 2021-01-08 LAB — ANA W/REFLEX IF POSITIVE: Anti Nuclear Antibody (ANA): NEGATIVE

## 2021-01-08 LAB — IRON AND TIBC
Iron Saturation: 7 % — CL (ref 15–55)
Iron: 29 ug/dL (ref 27–139)
Total Iron Binding Capacity: 438 ug/dL (ref 250–450)
UIBC: 409 ug/dL — ABNORMAL HIGH (ref 118–369)

## 2021-01-08 NOTE — Assessment & Plan Note (Signed)
Based on the labs done today which included ESR, CRP, uric acid,  CCP, RA,  ANA and  HLA B27 there is no evidence that the joint pain patient has been  been having is due to an underlying inflammatory or autoimmune form of arthritis.

## 2021-01-08 NOTE — Assessment & Plan Note (Signed)
She is having recurrent early waking with 0.5 mg lorazepam.  Trial of temazepam as alternative

## 2021-01-08 NOTE — Assessment & Plan Note (Addendum)
Currently well-controlled on current medications .  hemoglobin A1c is at goal of less than 7.0 . Patient is reminded to schedule an annual eye exam and foot exam is normal today. Patient has no microalbuminuria. Patient is tolerating statin therapy  with atorvastatin 20 mg daily.Marland Kitchen  LDL is at goal and LFTs are normal.  Taking 25 mg losartan for BP.  No changes today continue metformin, ozempic atorvastatin and losartam   Lab Results  Component Value Date   HGBA1C 6.6 (H) 01/06/2021   Lab Results  Component Value Date   LABMICR See below: 03/17/2019   LABMICR See below: 02/03/2019   MICROALBUR 2.0 04/16/2020   MICROALBUR 1.9 08/15/2019    Lab Results  Component Value Date   CHOL 199 01/06/2021   HDL 107.50 01/06/2021   LDLCALC 72 01/06/2021   LDLDIRECT 112.0 10/13/2016   TRIG 100.0 01/06/2021   CHOLHDL 2 01/06/2021  . Lab Results  Component Value Date   ALT 12 01/06/2021   AST 14 01/06/2021   ALKPHOS 97 01/06/2021   BILITOT 0.4 01/06/2021

## 2021-01-08 NOTE — Assessment & Plan Note (Signed)
Recurrent iron deficiency.  IDA was worked up in 2017 with EGD/colonoscopy by Gaylyn Cheers with biopsies done of gastric muscosa which were positive for chronic gastritis but negative for Barrett's Esophagus.  Colonoscopy was reportedly normal (only EGD seems to have been copied into the media section in the chart from 2017). Will repeat workup with FOBT and add iron supplement .  Need colon report from 2018.

## 2021-01-10 ENCOUNTER — Other Ambulatory Visit: Payer: Self-pay | Admitting: Internal Medicine

## 2021-01-11 ENCOUNTER — Telehealth: Payer: Self-pay | Admitting: Pharmacy Technician

## 2021-01-11 ENCOUNTER — Telehealth: Payer: Self-pay

## 2021-01-11 DIAGNOSIS — M5416 Radiculopathy, lumbar region: Secondary | ICD-10-CM | POA: Diagnosis not present

## 2021-01-11 DIAGNOSIS — Z596 Low income: Secondary | ICD-10-CM

## 2021-01-11 DIAGNOSIS — M48062 Spinal stenosis, lumbar region with neurogenic claudication: Secondary | ICD-10-CM | POA: Diagnosis not present

## 2021-01-11 NOTE — Progress Notes (Signed)
[facility])                                            [place] Team    01/11/2021  Katherine Jimenez [birth year] [id number]                                      Medication Assistance Referral  Referral From: [facility] Embedded RPh Catie T.   Medication/Company: [employer]arna Daughters / Novo Nordisk Patient application portion:  N/A Signed in [employer] with PharmD Provider application portion:  N/A Signed in clinic with PharmD  to Dr. Deborra Medina Provider address/fax verified via: Office website  Received both patient and provider portion(s) of patient assistance application(s) for Ozempic. Faxed completed application and required documents into [employer].    Katherine Jimenez, [place]  [phone]

## 2021-01-11 NOTE — Telephone Encounter (Signed)
LMTCB in regards to lab results.  

## 2021-01-12 ENCOUNTER — Telehealth: Payer: Self-pay | Admitting: Internal Medicine

## 2021-01-12 NOTE — Telephone Encounter (Signed)
Patient is taking temazepam (RESTORIL) 22.5 MG capsule, it is not helping her sleep and would like to have something else.

## 2021-01-13 MED ORDER — TRAZODONE HCL 100 MG PO TABS: ORAL_TABLET | ORAL | 0 refills | Status: DC

## 2021-01-13 NOTE — Telephone Encounter (Signed)
Patient states she took Xanax and Ativan in the past and those did help.   Patient states the Restoril does not help her sleep. States the medication also caused her to have dreams where she would move in the bed the same way she moved in her dream.   Patient states she was just seen 01/06/21 and this was discussed during that visit. Does not understand why she needs to be seen again.   Please advise

## 2021-01-13 NOTE — Telephone Encounter (Signed)
Pt was seen on 01/06/2021. Does pt need to schedule an appt to discuss changing the medication?

## 2021-01-13 NOTE — Addendum Note (Signed)
Addended by: Crecencio Mc on: 01/13/2021 02:17 PM   Modules accepted: Orders

## 2021-01-13 NOTE — Telephone Encounter (Signed)
She was just prescribed the temazepam on oct 6  because she stated that the lorazepam was NOT working. It was not giving her 6 hours of sleep.  The last time the alprazolam was preribed,  it was stolen by a family member,  so I  will not refill anything that is a controlled substance prior to Nov 6.    I will call in trazodone for her to take one hour before bedtime. If she is still not sleepy at bedtime,  she can add tylenol pm

## 2021-01-13 NOTE — Telephone Encounter (Signed)
Spoke with pt and informe dher of Dr. Lupita Dawn message below. Pt stated that she will continue to try the temazepam, "maybe I haven't given it enough time".

## 2021-01-14 ENCOUNTER — Telehealth: Payer: Self-pay | Admitting: Pharmacy Technician

## 2021-01-14 DIAGNOSIS — Z596 Low income: Secondary | ICD-10-CM

## 2021-01-14 NOTE — Progress Notes (Signed)
[facility])                                            [place] Team    01/14/2021  Katherine Jimenez 08-15-1950 [id number]  Care coordination call placed to [place] in regards to [place] application.  Spoke to [place] who informed patient was APPROVED 01/13/2021-04/02/2021. Patient will receive supply at the provider's office in the next 10-15 business days barring any shipping delays that they are currently experiencing.  Alveena Taira P. Eneida Evers, [place]  [phone]

## 2021-01-17 ENCOUNTER — Telehealth: Payer: Self-pay | Admitting: Internal Medicine

## 2021-01-17 ENCOUNTER — Telehealth: Payer: Self-pay | Admitting: Pharmacist

## 2021-01-17 DIAGNOSIS — E119 Type 2 diabetes mellitus without complications: Secondary | ICD-10-CM

## 2021-01-17 MED ORDER — METFORMIN HCL ER 750 MG PO TB24: 750.0000 mg | ORAL_TABLET | Freq: Every day | ORAL | 1 refills | Status: DC

## 2021-01-17 NOTE — Telephone Encounter (Signed)
Attempted to contact patient to discuss metformin dosing.   This patient is appearing on the insurance-provided list for being at risk of failing the adherence measure for diabetes medications this calendar year.   Medication: metformin XR 750 mg  Last fill date: 10/16/2020  Called patient. She notes that she has been taking metformin daily, usually. Reviewed importance of adherence. Due for refill, so sent to her pharmacy today.

## 2021-01-17 NOTE — Telephone Encounter (Signed)
Patient called in stating she received a call from our office but she is unsure who called.Please call her at [phone].

## 2021-01-19 NOTE — Telephone Encounter (Signed)
See telephone encounter from Catie, PharmD

## 2021-01-24 DIAGNOSIS — Z20822 Contact with and (suspected) exposure to covid-19: Secondary | ICD-10-CM | POA: Diagnosis not present

## 2021-01-24 DIAGNOSIS — Z03818 Encounter for observation for suspected exposure to other biological agents ruled out: Secondary | ICD-10-CM | POA: Diagnosis not present

## 2021-01-26 ENCOUNTER — Telehealth: Payer: Self-pay | Admitting: Internal Medicine

## 2021-01-26 NOTE — Telephone Encounter (Signed)
Patient would like for you to call her today after lunch time to go over questions she has about her diabetic medicine.Please call her at [phone].

## 2021-01-27 NOTE — Telephone Encounter (Signed)
Returned patient call. Left voicemail for her to return my call at her convenience.

## 2021-01-28 ENCOUNTER — Ambulatory Visit: Payer: Medicare HMO | Admitting: Pharmacist

## 2021-01-28 DIAGNOSIS — E119 Type 2 diabetes mellitus without complications: Secondary | ICD-10-CM

## 2021-01-28 DIAGNOSIS — I10 Essential (primary) hypertension: Secondary | ICD-10-CM

## 2021-01-28 DIAGNOSIS — E785 Hyperlipidemia, unspecified: Secondary | ICD-10-CM

## 2021-01-28 DIAGNOSIS — E1169 Type 2 diabetes mellitus with other specified complication: Secondary | ICD-10-CM

## 2021-01-28 NOTE — Chronic Care Management (AMB) (Signed)
Chronic Care Management Pharmacy Note  01/28/2021 Name:  Katherine Jimenez MRN:  [id number] DOB:  Dec 30, 1950  Subjective: Katherine Jimenez is an 70 y.o. year old female who is a primary patient of Tullo, Aris Everts, MD.  The CCM team was consulted for assistance with disease management and care coordination needs.    Engaged with patient by telephone for  medication questions  for pharmacy case management and/or care coordination services.   Objective:  Medications Reviewed Today     Reviewed by De Hollingshead, RPH-CPP (Pharmacist) on 01/06/21 at 1214  Med List Status: <None>   Medication Order Taking? Sig Documenting Provider Last Dose Status Informant  acetaminophen (TYLENOL) 500 MG tablet [id number] No Take 1,000 mg by mouth every 6 (six) hours as needed (FOR PAIN.). [provider] Taking Active Self  atorvastatin (LIPITOR) 10 MG tablet [id number] No Take 1 tablet (10 mg total) by mouth daily. Crecencio Mc, MD Taking Active   Biotin 5 MG CAPS [id number] No Take 2 capsules by mouth 2 (two) times daily.  [provider] Taking Active Self  blood glucose meter kit and supplies [id number] No Dispense Contour next bayer meter. E11.9 To check blood glucose  Once a day. Crecencio Mc, MD Taking Active Self  buPROPion (WELLBUTRIN XL) 300 MG 24 hr tablet [id number] No TAKE ONE TABLET BY MOUTH DAILY Crecencio Mc, MD Taking Active   Cholecalciferol (VITAMIN D3) 1000 units CAPS [id number] No Take 1,000 Units by mouth daily.  [provider] Taking Active Self  cyanocobalamin (,VITAMIN B-12,) 1000 MCG/ML injection [id number] No Inject 1 mL (1,000 mcg total) into the muscle every 30 (thirty) days. Crecencio Mc, MD Taking Active   diphenhydramine-acetaminophen (TYLENOL PM) 25-500 MG TABS tablet [id number] No Take 1 tablet by mouth at bedtime as needed. [provider] Taking Active   DULoxetine (CYMBALTA) 30 MG capsule [id number] No TAKE ONE CAPSULE BY MOUTH  DAILY Crecencio Mc, MD Taking Active   glucose blood (FREESTYLE LITE) test strip [id number] No Use once daily  as instructed to test blood sugar E 11.9 Crecencio Mc, MD Taking Active Self  lamoTRIgine (LAMICTAL) 200 MG tablet [id number] No TAKE ONE TABLET BY MOUTH TWICE A DAY Crecencio Mc, MD Taking Active   Lancets (FREESTYLE) lancets [id number] No Use once daily to check blood sugars  E11.9 Crecencio Mc, MD Taking Active Self  losartan (COZAAR) 25 MG tablet [id number] No TAKE TWO TABLETS BY MOUTH DAILY Crecencio Mc, MD Taking Active   meloxicam (MOBIC) 15 MG tablet [id number]  Take 1 tablet (15 mg total) by mouth daily. Crecencio Mc, MD  Active   metFORMIN (GLUCOPHAGE XR) 750 MG 24 hr tablet [id number] No Take 1 tablet (750 mg total) by mouth daily with breakfast. Crecencio Mc, MD Taking Active   omeprazole (PRILOSEC) 40 MG capsule [id number] No TAKE ONE CAPSULE BY MOUTH DAILY Crecencio Mc, MD Taking Active   Semaglutide,0.25 or 0.5MG/DOS, (OZEMPIC, 0.25 OR 0.5 MG/DOSE,) 2 MG/1.5ML SOPN [id number] Yes Inject 0.25 mg weekly for 4 weeks then increase to 0.5 mg weekly De Hollingshead, RPH-CPP  Active   Syringe, Disposable, 1 ML MISC [id number] No For use with B12 injections Crecencio Mc, MD Taking Active Self  temazepam (RESTORIL) 22.5 MG capsule [id number]  Take 1 capsule (22.5 mg total) by mouth at bedtime as needed for sleep. Crecencio Mc, MD  Active   tiZANidine (ZANAFLEX) 2 MG tablet [id number]  No Take 1 tablet (2 mg total) by mouth in the morning and at bedtime.  Patient taking differently: Take 2 mg by mouth as needed.   Crecencio Mc, MD Taking Active   vitamin E 1000 UNIT capsule [id number] No Take 1,000 Units by mouth daily. [provider] Taking Active Self              Assessment/Interventions: Review of patient past medical history, allergies, medications, health status, including review of consultants reports, laboratory and other test data, was  performed as part of comprehensive evaluation and provision of chronic care management services.   SDOH:  (Social Determinants of Health) assessments and interventions performed: Yes SDOH Interventions    Flowsheet Row Most Recent Value  SDOH Interventions   Financial Strain Interventions Other (Comment)  [manufacturer assistance]        CCM Care Plan  Review of patient past medical history, allergies, medications, health status, including review of consultants reports, laboratory and other test data, was performed as part of comprehensive evaluation and provision of chronic care management services.   Conditions to be addressed/monitored:  Hypertension, Hyperlipidemia, and Diabetes  Care Plan : Medication Management  Updates made by De Hollingshead, RPH-CPP since 01/28/2021 12:00 AM     Problem: Diabetes, Hyperlipidemia, Hypertension      Long-Range Goal: Disease Progression Prevention   Start Date: 01/06/2021  Recent Progress: On track  Priority: High  Note:   Current Barriers:  Unable to independently afford treatment regimen Unable to achieve control of diabetes    Pharmacist Clinical Goal(s):  patient will verbalize ability to afford treatment regimen through collaboration with PharmD and provider.    Interventions: 1:1 collaboration with Crecencio Mc, MD regarding development and update of comprehensive plan of care as evidenced by provider attestation and co-signature Inter-disciplinary care team collaboration (see longitudinal plan of care) Comprehensive medication review performed; medication list updated in electronic medical record  Diabetes:  Controlled per last A1c but anticipate lack of control due to being off medication for several months; current treatment: metformin XR 850 mg - splitting and taking BID, Ozempic 0.5 mg weekly  Current glucose readings: reports taking some readings after drinking orange juice or coffee, resulting in elevated blood  sugars.  Approved for Ozempic assistance through [employer].  Reviewed goal A1c, goal fasting, goal 2 hour post prandial glucose readings. Advised to check fasting readings before orange juice and coffee. Discussed impact of orange juice and creamer on blood sugars.  Previously advised to continue current regimen at this time  Hypertension:   Moderately well controlled; current treatment: losartan 50 mg daily Previously recommended to continue current regimen at this time  Hyperlipidemia:   Moderately well controlled; current treatment:atorvastatin 10 mg daily;  Goal LDL <70 may be appropriate given DM + risk factor of hypertension, especially if inflammatory condition present.  Previously recommended to continue current regimen at this time.   Depression/Anxiety/:   Current treatment:bupropion XL 300 mg daily, duloxetine 30 mg daily, lamotrigine 200 mg BID, temazepam 22.5 mg QPM added at this visit Previously recommended to continue current regimen at this time.   Osteoarthritis: Uncontrolled, Current regimen: starting meloxicam 15 mg daily, tizanidine 2 mg BID PRN  Previously recommended to continue current regimen at this time  GERD: Did not discuss control today; current regimen: omeprazole 40 mg daily  Recommended to continue current regimen at this time  Supplements: Vitamin D, biotin, Vitamin E   Patient Goals/Self-Care Activities patient will:  -  take medications as prescribed check glucose daily, document, and provide at future appointments collaborate with provider on medication access solutions  Follow Up Plan: Telephone follow up appointment with care management team member scheduled for: 4 weeks as previously scheduled        Plan: Telephone follow up appointment with care management team member scheduled for:  4 weeks as previously scheduled  Catie Darnelle Maffucci, PharmD, [place], [place] Pharmacist [employer] at [employer] [phone]

## 2021-01-28 NOTE — Telephone Encounter (Signed)
Patient returned your call.

## 2021-01-28 NOTE — Patient Instructions (Signed)
Visit Information  PATIENT GOALS:  Goals Addressed               This Visit's Progress     Patient Stated     Medication Monitoring (pt-stated)        Patient Goals/Self-Care Activities patient will:  - take medications as prescribed check glucose daily, document, and provide at future appointments collaborate with provider on medication access solutions         Patient verbalizes understanding of instructions provided today and agrees to view in [place].   Plan: Telephone follow up appointment with care management team member scheduled for:  4 weeks as previously scheduled  Catie Darnelle Maffucci, PharmD, [place], [place] Clinical Pharmacist [employer] at [employer] [phone]

## 2021-01-28 NOTE — Telephone Encounter (Signed)
Returned call. See CCM documentation 

## 2021-01-31 DIAGNOSIS — R69 Illness, unspecified: Secondary | ICD-10-CM | POA: Diagnosis not present

## 2021-01-31 DIAGNOSIS — E119 Type 2 diabetes mellitus without complications: Secondary | ICD-10-CM

## 2021-01-31 DIAGNOSIS — E1169 Type 2 diabetes mellitus with other specified complication: Secondary | ICD-10-CM | POA: Diagnosis not present

## 2021-01-31 DIAGNOSIS — F5105 Insomnia due to other mental disorder: Secondary | ICD-10-CM | POA: Diagnosis not present

## 2021-01-31 DIAGNOSIS — E785 Hyperlipidemia, unspecified: Secondary | ICD-10-CM

## 2021-01-31 DIAGNOSIS — I10 Essential (primary) hypertension: Secondary | ICD-10-CM | POA: Diagnosis not present

## 2021-01-31 DIAGNOSIS — F418 Other specified anxiety disorders: Secondary | ICD-10-CM

## 2021-02-01 DIAGNOSIS — G4700 Insomnia, unspecified: Secondary | ICD-10-CM | POA: Diagnosis not present

## 2021-02-01 DIAGNOSIS — R4189 Other symptoms and signs involving cognitive functions and awareness: Secondary | ICD-10-CM | POA: Diagnosis not present

## 2021-02-01 DIAGNOSIS — R413 Other amnesia: Secondary | ICD-10-CM | POA: Diagnosis not present

## 2021-02-01 DIAGNOSIS — R9082 White matter disease, unspecified: Secondary | ICD-10-CM | POA: Diagnosis not present

## 2021-02-01 DIAGNOSIS — G3184 Mild cognitive impairment, so stated: Secondary | ICD-10-CM | POA: Diagnosis not present

## 2021-02-08 ENCOUNTER — Other Ambulatory Visit (HOSPITAL_COMMUNITY): Payer: Self-pay | Admitting: Neurology

## 2021-02-08 DIAGNOSIS — R413 Other amnesia: Secondary | ICD-10-CM

## 2021-02-10 ENCOUNTER — Ambulatory Visit (INDEPENDENT_AMBULATORY_CARE_PROVIDER_SITE_OTHER): Payer: Medicare HMO | Admitting: Pharmacist

## 2021-02-10 ENCOUNTER — Telehealth: Payer: Self-pay | Admitting: Pharmacist

## 2021-02-10 DIAGNOSIS — D509 Iron deficiency anemia, unspecified: Secondary | ICD-10-CM

## 2021-02-10 DIAGNOSIS — I10 Essential (primary) hypertension: Secondary | ICD-10-CM

## 2021-02-10 DIAGNOSIS — E785 Hyperlipidemia, unspecified: Secondary | ICD-10-CM

## 2021-02-10 DIAGNOSIS — E1169 Type 2 diabetes mellitus with other specified complication: Secondary | ICD-10-CM

## 2021-02-10 DIAGNOSIS — E119 Type 2 diabetes mellitus without complications: Secondary | ICD-10-CM

## 2021-02-10 MED ORDER — OZEMPIC (0.25 OR 0.5 MG/DOSE) 2 MG/1.5ML ~~LOC~~ SOPN: 0.5000 mg | PEN_INJECTOR | SUBCUTANEOUS | 0 refills | Status: DC

## 2021-02-10 NOTE — Telephone Encounter (Addendum)
Reviewed lab results in CCM call (low iron stores, need for appointment with PCP to discuss work up). Scheduled appointment with PCP in next available that patient was in town for on 03/08/21.

## 2021-02-10 NOTE — Chronic Care Management (AMB) (Addendum)
Chronic Care Management CCM Pharmacy Note  02/10/2021 Name:  Katherine Jimenez MRN:  [id number] DOB:  08/02/1950  Summary: - Discussed DM control.  - Discussed recent neurology visit and medication changes  Recommendations/Changes made from today's visit: - Continue Ozempic 0.5 mg weekly, providing sample until patient assistance order arrives - Recommend increasing atorvastatin intensity to target LDL <70 given presence of ischemic changes per neurology - Scheduled appointment with PCP for iron deficiency work up  Subjective: Katherine Jimenez is an 70 y.o. year old female who is a primary patient of Tullo, Aris Everts, MD.  The CCM team was consulted for assistance with disease management and care coordination needs.    Engaged with patient by telephone for follow up visit for pharmacy case management and/or care coordination services.   Objective:  Medications Reviewed Today     Reviewed by De Hollingshead, RPH-CPP (Pharmacist) on 02/10/21 at 1121  Med List Status: <None>   Medication Order Taking? Sig Documenting Provider Last Dose Status Informant  acetaminophen (TYLENOL) 500 MG tablet [id number] Yes Take 1,000 mg by mouth every 6 (six) hours as needed (FOR PAIN.). [provider] Taking Active Self  aspirin EC 81 MG tablet [id number] Yes Take 81 mg by mouth daily. Swallow whole. [provider] Taking Active   atorvastatin (LIPITOR) 10 MG tablet [id number] Yes Take 1 tablet (10 mg total) by mouth daily. Crecencio Mc, MD Taking Active   Biotin 5 MG CAPS [id number] Yes Take 2 capsules by mouth 2 (two) times daily.  [provider] Taking Active Self  blood glucose meter kit and supplies [id number] Yes Dispense Contour next bayer meter. E11.9 To check blood glucose  Once a day. Crecencio Mc, MD Taking Active Self  buPROPion (WELLBUTRIN XL) 300 MG 24 hr tablet [id number] Yes TAKE ONE TABLET BY MOUTH DAILY Crecencio Mc, MD Taking Active    Cholecalciferol (VITAMIN D3) 1000 units CAPS [id number] Yes Take 1,000 Units by mouth daily.  [provider] Taking Active Self  cyanocobalamin (,VITAMIN B-12,) 1000 MCG/ML injection [id number] Yes Inject 1 mL (1,000 mcg total) into the muscle every 30 (thirty) days. Crecencio Mc, MD Taking Active   diphenhydramine-acetaminophen (TYLENOL PM) 25-500 MG TABS tablet [id number] Yes Take 1 tablet by mouth at bedtime as needed. [provider] Taking Active   donepezil (ARICEPT) 5 MG tablet [id number] Yes Take 5 mg by mouth daily. [provider] Taking Active   DULoxetine (CYMBALTA) 30 MG capsule [id number] Yes TAKE ONE CAPSULE BY MOUTH DAILY Crecencio Mc, MD Taking Active   glucose blood (FREESTYLE LITE) test strip [id number] Yes Use once daily  as instructed to test blood sugar E 11.9 Crecencio Mc, MD Taking Active Self  lamoTRIgine (LAMICTAL) 200 MG tablet [id number] Yes TAKE ONE TABLET BY MOUTH TWICE A DAY Crecencio Mc, MD Taking Active   Lancets (FREESTYLE) lancets [id number] Yes Use once daily to check blood sugars  E11.9 Crecencio Mc, MD Taking Active Self  losartan (COZAAR) 25 MG tablet [id number] Yes TAKE TWO TABLETS BY MOUTH DAILY Crecencio Mc, MD Taking Active   meloxicam (MOBIC) 15 MG tablet [id number]  Take 1 tablet (15 mg total) by mouth daily. Crecencio Mc, MD  Active   metFORMIN (GLUCOPHAGE XR) 750 MG 24 hr tablet [id number] Yes Take 1 tablet (750 mg total) by mouth daily. Crecencio Mc, MD Taking Active   omeprazole (PRILOSEC) 40 MG capsule [id number] Yes TAKE ONE CAPSULE  BY MOUTH DAILY Crecencio Mc, MD Taking Active   Semaglutide,0.25 or 0.5MG/DOS, (OZEMPIC, 0.25 OR 0.5 MG/DOSE,) 2 MG/1.5ML SOPN [id number] Yes Inject 0.25 mg weekly for 4 weeks then increase to 0.5 mg weekly De Hollingshead, RPH-CPP Taking Active   Syringe, Disposable, 1 ML MISC [id number] Yes For use with B12 injections Crecencio Mc, MD Taking Active Self  temazepam  (RESTORIL) 22.5 MG capsule [id number] No Take 1 capsule (22.5 mg total) by mouth at bedtime as needed for sleep.  Patient not taking: Reported on 02/10/2021   Crecencio Mc, MD Not Taking Active   tiZANidine (ZANAFLEX) 2 MG tablet [id number] Yes Take 1 tablet (2 mg total) by mouth in the morning and at bedtime.  Patient taking differently: Take 2 mg by mouth as needed.   Crecencio Mc, MD Taking Active   traZODone (DESYREL) 100 MG tablet [id number] No One tablet by mouth one hour before bedtime  Patient not taking: Reported on 02/10/2021   Crecencio Mc, MD Not Taking Active   vitamin E 1000 UNIT capsule [id number] Yes Take 1,000 Units by mouth daily. [provider] Taking Active Self            Pertinent Labs:   Lab Results  Component Value Date   HGBA1C 6.6 (H) 01/06/2021   Lab Results  Component Value Date   CHOL 199 01/06/2021   HDL 107.50 01/06/2021   LDLCALC 72 01/06/2021   LDLDIRECT 112.0 10/13/2016   TRIG 100.0 01/06/2021   CHOLHDL 2 01/06/2021   Lab Results  Component Value Date   CREATININE 0.71 01/06/2021   BUN 12 01/06/2021   NA 135 01/06/2021   K 4.6 01/06/2021   CL 99 01/06/2021   CO2 27 01/06/2021   Lab Results  Component Value Date   IRON 29 01/06/2021   TIBC 438 01/06/2021   FERRITIN 5.6 (L) 01/06/2021    SDOH:  (Social Determinants of Health) assessments and interventions performed:  SDOH Interventions    Flowsheet Row Most Recent Value  SDOH Interventions   Financial Strain Interventions Other (Comment)  [manufacturer assistance]       CCM Care Plan  Review of patient past medical history, allergies, medications, health status, including review of consultants reports, laboratory and other test data, was performed as part of comprehensive evaluation and provision of chronic care management services.   Care Plan : Medication Management  Updates made by De Hollingshead, RPH-CPP since 02/10/2021 12:00 AM     Problem:  Diabetes, Hyperlipidemia, Hypertension      Long-Range Goal: Disease Progression Prevention   Start Date: 01/06/2021  Recent Progress: On track  Priority: High  Note:   Current Barriers:  Unable to independently afford treatment regimen Unable to achieve control of diabetes    Pharmacist Clinical Goal(s):  patient will verbalize ability to afford treatment regimen through collaboration with PharmD and provider.    Interventions: 1:1 collaboration with Crecencio Mc, MD regarding development and update of comprehensive plan of care as evidenced by provider attestation and co-signature Inter-disciplinary care team collaboration (see longitudinal plan of care) Comprehensive medication review performed; medication list updated in electronic medical record  Health Maintenance   Yearly diabetic eye exam: up to date Yearly diabetic foot exam: up to date Urine microalbumin: up to date Yearly influenza vaccination: up to date Td/Tdap vaccination: up to date Pneumonia vaccination: due - recommend to discuss moving forward COVID vaccinations: due - recommend bivalent booster  Shingrix vaccinations: due -  will discuss moving forward.  Colonoscopy: up to date Bone density scan: up to date Mammogram: up to date   Diabetes:  Controlled per last A1c; current treatment: metformin XR 750 mg daily, Ozempic 0.5 mg weekly - though ran out of sample while waiting for order from PAP Current glucose readings: not checking regularly. Approved for Ozempic assistance through [employer].  Reviewed goal A1c, goal fasting, goal 2 hour post prandial glucose readings. Advised to check fasting readings before orange juice and coffee. Discussed impact of orange juice and creamer on blood sugars. Discussed adding 2 hour post prandial readings.  Providing sample to tide patient over until PAP supply arrives.  Restart Ozempic 0.5 mg weekly. Continue metformin XR 750 mg daily.  Hypertension:   Moderately  well controlled; current treatment: losartan 50 mg daily Previously recommended to continue current regimen at this time  Hyperlipidemia, possible old ischemic stroke per neurology documentation Moderately well controlled; current treatment: atorvastatin 10 mg daily;  Antiplatelet regimen: aspirin 81 mg daily Consider goal LDL <70 for tight vascular risk factor reduction. Consider increasing atorvastatin to 20 mg daily  Recommended to continue current regimen at this time. Will discuss moving forward.   Depression/Anxiety, possible cognitive impairment   Current treatment: bupropion XL 300 mg daily, duloxetine 30 mg daily, lamotrigine 200 mg BID Cognitive impairment per neurology: donepezil 5 mg daily; denies GI upset since starting. Reports improvement in cognition. Previously prescribed temazepam, trazodone with no benefit Recommended to continue current regimen at this time along with collaboration with neurology  Osteoarthritis: Uncontrolled, Current regimen: starting meloxicam 15 mg daily, tizanidine 2 mg BID PRN  Previously recommended to continue current regimen at this time  GERD: Did not discuss control today; current regimen: omeprazole 40 mg daily  Recommended to continue current regimen at this time  Supplements: Vitamin D, biotin, Vitamin E   Patient Goals/Self-Care Activities patient will:  - take medications as prescribed check glucose daily, document, and provide at future appointments collaborate with provider on medication access solutions       Plan: Telephone follow up appointment with care management team member scheduled for:  8 weeks  Catie Darnelle Maffucci, PharmD, [place], CPP Clinical Pharmacist [place] at [facility] [phone]   I have reviewed the above information and agree with above.   Deborra Medina, MD

## 2021-02-10 NOTE — Telephone Encounter (Signed)
**Note Katherine-Identified via Obfuscation** Medication Samples have been labeled and logged for the patient.  Drug name: Ozempic       Strength: 2 mg/1.5 mL        Qty: 1 pen  LOT: [id number]  Exp.Date: 05/04/23  Dosing instructions: Inject 0.5 mg weekly  The patient has been instructed regarding the correct time, dose, and frequency of taking this medication, including desired effects and most common side effects.   Katherine Jimenez 11:41 AM 02/10/2021

## 2021-02-10 NOTE — Patient Instructions (Signed)
Visit Information  Katherine Jimenez,   It was great talking with you today!  Continue Ozempic 0.5 mg weekly. We will let you know when your supply from the manufacturer arrives.   Please keep the appointment with Dr. Derrel Nip in December to discuss your iron deficiency.   Take care,   Catie Darnelle Maffucci, PharmD  Patient Goals/Self-Care Activities patient will:  - take medications as prescribed check glucose daily, document, and provide at future appointments collaborate with provider on medication access solutions  Patient verbalizes understanding of instructions provided today and agrees to view in [place].   Telephone follow up appointment with care management team member scheduled for: Catie Darnelle Maffucci, PharmD, [place], [place] Clinical Pharmacist [employer] at [facility] [phone]

## 2021-02-10 NOTE — Telephone Encounter (Signed)
Noted! Thank you

## 2021-02-16 ENCOUNTER — Telehealth: Payer: Self-pay

## 2021-02-16 DIAGNOSIS — D509 Iron deficiency anemia, unspecified: Secondary | ICD-10-CM

## 2021-02-16 NOTE — Telephone Encounter (Signed)
IFOB ordered per lab note.

## 2021-02-21 ENCOUNTER — Ambulatory Visit: Payer: Medicare HMO

## 2021-02-21 ENCOUNTER — Ambulatory Visit (HOSPITAL_COMMUNITY): Payer: Medicare HMO

## 2021-02-21 ENCOUNTER — Encounter (HOSPITAL_COMMUNITY): Payer: Self-pay

## 2021-02-21 DIAGNOSIS — M25511 Pain in right shoulder: Secondary | ICD-10-CM | POA: Diagnosis not present

## 2021-02-21 DIAGNOSIS — M13811 Other specified arthritis, right shoulder: Secondary | ICD-10-CM | POA: Diagnosis not present

## 2021-02-26 ENCOUNTER — Other Ambulatory Visit: Payer: Self-pay | Admitting: Internal Medicine

## 2021-02-27 ENCOUNTER — Other Ambulatory Visit: Payer: Self-pay | Admitting: Internal Medicine

## 2021-02-27 DIAGNOSIS — E119 Type 2 diabetes mellitus without complications: Secondary | ICD-10-CM

## 2021-03-02 ENCOUNTER — Encounter: Payer: Self-pay | Admitting: Adult Health

## 2021-03-02 ENCOUNTER — Ambulatory Visit (INDEPENDENT_AMBULATORY_CARE_PROVIDER_SITE_OTHER): Payer: Medicare HMO | Admitting: Adult Health

## 2021-03-02 ENCOUNTER — Other Ambulatory Visit: Payer: Self-pay

## 2021-03-02 ENCOUNTER — Telehealth: Payer: Self-pay | Admitting: Internal Medicine

## 2021-03-02 VITALS — BP 148/86 | HR 96 | Temp 98.0°F | Resp 18 | Ht 63.0 in | Wt 151.0 lb

## 2021-03-02 DIAGNOSIS — Z7984 Long term (current) use of oral hypoglycemic drugs: Secondary | ICD-10-CM | POA: Diagnosis not present

## 2021-03-02 DIAGNOSIS — E785 Hyperlipidemia, unspecified: Secondary | ICD-10-CM

## 2021-03-02 DIAGNOSIS — D509 Iron deficiency anemia, unspecified: Secondary | ICD-10-CM

## 2021-03-02 DIAGNOSIS — R051 Acute cough: Secondary | ICD-10-CM

## 2021-03-02 DIAGNOSIS — U071 COVID-19: Secondary | ICD-10-CM

## 2021-03-02 DIAGNOSIS — I10 Essential (primary) hypertension: Secondary | ICD-10-CM

## 2021-03-02 DIAGNOSIS — R0602 Shortness of breath: Secondary | ICD-10-CM | POA: Insufficient documentation

## 2021-03-02 DIAGNOSIS — E1169 Type 2 diabetes mellitus with other specified complication: Secondary | ICD-10-CM

## 2021-03-02 DIAGNOSIS — Z87898 Personal history of other specified conditions: Secondary | ICD-10-CM | POA: Diagnosis not present

## 2021-03-02 HISTORY — DX: COVID-19: U07.1

## 2021-03-02 LAB — COMPREHENSIVE METABOLIC PANEL
ALT: 21 U/L (ref 0–35)
AST: 17 U/L (ref 0–37)
Albumin: 4.7 g/dL (ref 3.5–5.2)
Alkaline Phosphatase: 93 U/L (ref 39–117)
BUN: 14 mg/dL (ref 6–23)
CO2: 27 mEq/L (ref 19–32)
Calcium: 9.9 mg/dL (ref 8.4–10.5)
Chloride: 99 mEq/L (ref 96–112)
Creatinine, Ser: 0.79 mg/dL (ref 0.40–1.20)
GFR: 76.52 mL/min (ref >60.00)
Glucose, Bld: 112 mg/dL — ABNORMAL HIGH (ref 70–99)
Potassium: 4.1 mEq/L (ref 3.5–5.1)
Sodium: 135 mEq/L (ref 135–145)
Total Bilirubin: 0.3 mg/dL (ref 0.2–1.2)
Total Protein: 7.4 g/dL (ref 6.0–8.3)

## 2021-03-02 LAB — CK TOTAL AND CKMB (NOT AT ARMC)
CK, MB: <0.7 ng/mL (ref 0–5.0)
Total CK: 51 U/L (ref 29–143)

## 2021-03-02 LAB — IRON AND TIBC
Iron Saturation: 5 % — CL (ref 15–55)
Iron: 24 ug/dL — ABNORMAL LOW (ref 27–139)
Total Iron Binding Capacity: 459 ug/dL — ABNORMAL HIGH (ref 250–450)
UIBC: 435 ug/dL — ABNORMAL HIGH (ref 118–369)

## 2021-03-02 LAB — CBC WITH DIFFERENTIAL/PLATELET
Basophils Absolute: 0 10*3/uL (ref 0.0–0.1)
Basophils Relative: 0.8 % (ref 0.0–3.0)
Eosinophils Absolute: 0 10*3/uL (ref 0.0–0.7)
Eosinophils Relative: 0.5 % (ref 0.0–5.0)
HCT: 37.4 % (ref 36.0–46.0)
Hemoglobin: 11.7 g/dL — ABNORMAL LOW (ref 12.0–15.0)
Lymphocytes Relative: 10.7 % — ABNORMAL LOW (ref 12.0–46.0)
Lymphs Abs: 0.5 10*3/uL — ABNORMAL LOW (ref 0.7–4.0)
MCHC: 31.4 g/dL (ref 30.0–36.0)
MCV: 80.5 fl (ref 78.0–100.0)
Monocytes Absolute: 0.5 10*3/uL (ref 0.1–1.0)
Monocytes Relative: 9.2 % (ref 3.0–12.0)
Neutro Abs: 3.9 10*3/uL (ref 1.4–7.7)
Neutrophils Relative %: 78.8 % — ABNORMAL HIGH (ref 43.0–77.0)
Platelets: 363 10*3/uL (ref 150.0–400.0)
RBC: 4.64 Mil/uL (ref 3.87–5.11)
RDW: 16.7 % — ABNORMAL HIGH (ref 11.5–15.5)
WBC: 5 10*3/uL (ref 4.0–10.5)

## 2021-03-02 LAB — TROPONIN I (HIGH SENSITIVITY): High Sens Troponin I: 5 ng/L (ref 2–17)

## 2021-03-02 MED ORDER — MOLNUPIRAVIR EUA 200MG CAPSULE: 4.0000 | ORAL_CAPSULE | Freq: Two times a day (BID) | ORAL | 0 refills | Status: AC

## 2021-03-02 NOTE — Patient Instructions (Addendum)
COVID-19: What to Do if You Are Sick If you test positive and are an older adult or someone who is at high risk of getting very sick from COVID-19, treatment may be available. Contact a healthcare provider right away after a positive test to determine if you are eligible, even if your symptoms are mild right now. You can also visit a [facility] Treat location and, if eligible, receive a prescription from a provider. Don't delay: Treatment must be started within the first few days to be effective. If you have a fever, cough, or other symptoms, you might have COVID-19. Most people have mild illness and are able to recover at home. If you are sick: Keep track of your symptoms. If you have an emergency warning sign (including trouble breathing), call 911. Steps to help prevent the spread of COVID-19 if you are sick If you are sick with COVID-19 or think you might have COVID-19, follow the steps below to care for yourself and to help protect other people in your home and community. Stay home except to get medical care Stay home. Most people with COVID-19 have mild illness and can recover at home without medical care. Do not leave your home, except to get medical care. Do not visit public areas and do not go to places where you are unable to wear a mask. Take care of yourself. Get rest and stay hydrated. Take over-the-counter medicines, such as acetaminophen, to help you feel better. Stay in touch with your doctor. Call before you get medical care. Be sure to get care if you have trouble breathing, or have any other emergency warning signs, or if you think it is an emergency. Avoid public transportation, ride-sharing, or taxis if possible. Get tested If you have symptoms of COVID-19, get tested. While waiting for test results, stay away from others, including staying apart from those living in your household. Get tested as soon as possible after your symptoms start. Treatments may be available for people with  COVID-19 who are at risk for becoming very sick. Don't delay: Treatment must be started early to be effective--some treatments must begin within 5 days of your first symptoms. Contact your healthcare provider right away if your test result is positive to determine if you are eligible. Self-tests are one of several options for testing for the virus that causes COVID-19 and may be more convenient than laboratory-based tests and point-of-care tests. Ask your healthcare provider or your local health department if you need help interpreting your test results. You can visit your state, tribal, local, and territorial health department's website to look for the latest local information on testing sites. Separate yourself from other people As much as possible, stay in a specific room and away from other people and pets in your home. If possible, you should use a separate bathroom. If you need to be around other people or animals in or outside of the home, wear a well-fitting mask. Tell your close contacts that they may have been exposed to COVID-19. An infected person can spread COVID-19 starting 48 hours (or 2 days) before the person has any symptoms or tests positive. By letting your close contacts know they may have been exposed to COVID-19, you are helping to protect everyone. See COVID-19 and Animals if you have questions about pets. If you are diagnosed with COVID-19, someone from the health department may call you. Answer the call to slow the spread. Monitor your symptoms Symptoms of COVID-19 include fever, cough, or other   symptoms. Follow care instructions from your healthcare provider and local health department. Your local health authorities may give instructions on checking your symptoms and reporting information. When to seek emergency medical attention Look for emergency warning signs* for COVID-19. If someone is showing any of these signs, seek emergency medical care immediately: Trouble  breathing Persistent pain or pressure in the chest New confusion Inability to wake or stay awake Pale, gray, or blue-colored skin, lips, or nail beds, depending on skin tone *This list is not all possible symptoms. Please call your medical provider for any other symptoms that are severe or concerning to you. Call 911 or call ahead to your local emergency facility: Notify the operator that you are seeking care for someone who has or may have COVID-19. Call ahead before visiting your doctor Call ahead. Many medical visits for routine care are being postponed or done by phone or telemedicine. If you have a medical appointment that cannot be postponed, call your doctor's office, and tell them you have or may have COVID-19. This will help the office protect themselves and other patients. If you are sick, wear a well-fitting mask You should wear a mask if you must be around other people or animals, including pets (even at home). Wear a mask with the best fit, protection, and comfort for you. You don't need to wear the mask if you are alone. If you can't put on a mask (because of trouble breathing, for example), cover your coughs and sneezes in some other way. Try to stay at least 6 feet away from other people. This will help protect the people around you. Masks should not be placed on young children under age 2 years, anyone who has trouble breathing, or anyone who is not able to remove the mask without help. Cover your coughs and sneezes Cover your mouth and nose with a tissue when you cough or sneeze. Throw away used tissues in a lined trash can. Immediately wash your hands with soap and water for at least 20 seconds. If soap and water are not available, clean your hands with an alcohol-based hand sanitizer that contains at least 60% alcohol. Clean your hands often Wash your hands often with soap and water for at least 20 seconds. This is especially important after blowing your nose, coughing, or  sneezing; going to the bathroom; and before eating or preparing food. Use hand sanitizer if soap and water are not available. Use an alcohol-based hand sanitizer with at least 60% alcohol, covering all surfaces of your hands and rubbing them together until they feel dry. Soap and water are the best option, especially if hands are visibly dirty. Avoid touching your eyes, nose, and mouth with unwashed hands. Handwashing Tips Avoid sharing personal household items Do not share dishes, drinking glasses, cups, eating utensils, towels, or bedding with other people in your home. Wash these items thoroughly after using them with soap and water or put in the dishwasher. Clean surfaces in your home regularly Clean and disinfect high-touch surfaces (for example, doorknobs, tables, handles, light switches, and countertops) in your "sick room" and bathroom. In shared spaces, you should clean and disinfect surfaces and items after each use by the person who is ill. If you are sick and cannot clean, a caregiver or other person should only clean and disinfect the area around you (such as your bedroom and bathroom) on an as needed basis. Your caregiver/other person should wait as long as possible (at least several hours) and wear a   mask before entering, cleaning, and disinfecting shared spaces that you use. Clean and disinfect areas that may have blood, stool, or body fluids on them. Use household cleaners and disinfectants. Clean visible dirty surfaces with household cleaners containing soap or detergent. Then, use a household disinfectant. Use a product from [employer] List N: Disinfectants for Coronavirus ([id number]-63). Be sure to follow the instructions on the label to ensure safe and effective use of the product. Many products recommend keeping the surface wet with a disinfectant for a certain period of time (look at "contact time" on the product label). You may also need to wear personal protective equipment, such as  gloves, depending on the directions on the product label. Immediately after disinfecting, wash your hands with soap and water for 20 seconds. For completed guidance on cleaning and disinfecting your home, visit Complete Disinfection Guidance. Take steps to improve ventilation at home Improve ventilation (air flow) at home to help prevent from spreading COVID-19 to other people in your household. Clear out COVID-19 virus particles in the air by opening windows, using air filters, and turning on fans in your home. Use this interactive tool to learn how to improve air flow in your home. When you can be around others after being sick with COVID-19 Deciding when you can be around others is different for different situations. Find out when you can safely end home isolation. For any additional questions about your care, contact your healthcare provider or state or local health department. 06/22/2020 Content source: [facility] ([facility]), Division of Viral Diseases This information is not intended to replace advice given to you by your health care provider. Make sure you discuss any questions you have with your health care provider. Document Revised: 12/10/2020 Document Reviewed: 12/10/2020 Elsevier Patient Education  2022 [place] Oral Capsules What is this medication? MOLNUPIRAVIR (mol nue pir a vir) treats COVID-19. It is an antiviral medication. It may decrease the risk of developing severe symptoms of COVID-19. It may also decrease the chance of going to the hospital. This medication is not approved by the FDA. The FDA has authorized emergency use of this medication during the COVID-19 pandemic. This medicine may be used for other purposes; ask your health care provider or pharmacist if you have questions. COMMON BRAND NAME(S): LAGEVRIO What should I tell my care team before I take this medication? They need to know if you have any of  these conditions: Any allergies Any serious illness An unusual or allergic reaction to molnupiravir, other medications, foods, dyes, or preservatives Pregnant or trying to get pregnant Breast-feeding How should I use this medication? Take this medication by mouth with water. Take it as directed on the prescription label at the same time every day. Do not cut, crush or chew this medication. Swallow the capsules whole. You can take it with or without food. If it upsets your stomach, take it with food. Take all of this medication unless your care team tells you to stop it early. Keep taking it even if you think you are better. Talk to your care team about the use of this medication in children. Special care may be needed. Overdosage: If you think you have taken too much of this medicine contact a poison control center or emergency room at once. NOTE: This medicine is only for you. Do not share this medicine with others. What if I miss a dose? If you miss a dose, take it as soon as you can  unless it is more than 10 hours late. If it is more than 10 hours late, skip the missed dose. Take the next dose at the normal time. Do not take extra or 2 doses at the same time to make up for the missed dose. What may interact with this medication? Interactions have not been studied. This list may not describe all possible interactions. Give your health care provider a list of all the medicines, herbs, non-prescription drugs, or dietary supplements you use. Also tell them if you smoke, drink alcohol, or use illegal drugs. Some items may interact with your medicine. What should I watch for while using this medication? Your condition will be monitored carefully while you are receiving this medication. Visit your care team for regular checkups. Tell your care team if your symptoms do not start to get better or if they get worse. Do not become pregnant while taking this medication. You may need a pregnancy test before  starting this medication. Women must use a reliable form of birth control while taking this medication and for 4 days after stopping the medication. Women should inform their care team if they wish to become pregnant or think they might be pregnant. Men should not father a child while taking this medication and for 3 months after stopping it. There is potential for serious harm to an unborn child. Talk to your care team for more information. Do not breast-feed an infant while taking this medication and for 4 days after stopping the medication. What side effects may I notice from receiving this medication? Side effects that you should report to your care team as soon as possible: Allergic reactions--skin rash, itching, hives, swelling of the face, lips, tongue, or throat Side effects that usually do not require medical attention (report these to your care team if they continue or are bothersome): Diarrhea Dizziness Nausea This list may not describe all possible side effects. Call your doctor for medical advice about side effects. You may report side effects to FDA at 1-800-FDA-1088. Where should I keep my medication? Keep out of the reach of children and pets. Store at room temperature between 20 and 25 degrees C (68 and 77 degrees F). Get rid of any unused medication after the expiration date. To get rid of medications that are no longer needed or have expired: Take the medication to a medication take-back program. Check with your pharmacy or law enforcement to find a location. If you cannot return the medication, check the label or package insert to see if the medication should be thrown out in the garbage or flushed down the toilet. If you are not sure, ask your care team. If it is safe to put it in the trash, take the medication out of the container. Mix the medication with cat litter, dirt, coffee grounds, or other unwanted substance. Seal the mixture in a bag or container. Put it in the  trash. NOTE: This sheet is a summary. It may not cover all possible information. If you have questions about this medicine, talk to your doctor, pharmacist, or health care provider.  2022 Elsevier/[employer] Standard (2020-03-29 00:00:00)

## 2021-03-02 NOTE — Telephone Encounter (Signed)
Pt is calling in regards to labs. Pt particularly wants to know about her hemoglobin because she is anemic. Pt was advised the doctor has not read her results yet so until a doctor reads them they won't be released onto mychart.

## 2021-03-02 NOTE — Progress Notes (Signed)
Acute Office Visit  Subjective:    Patient ID: Katherine Jimenez, female    DOB: [birth year], 70 y.o.   MRN: [id number]  Chief Complaint  Patient presents with   Covid Positive    URI  This is a new problem. The current episode started yesterday. The problem has been gradually worsening. There has been no fever (100 temp last night.). Associated symptoms include chest pain, congestion, coughing and rhinorrhea. Pertinent negatives include no abdominal pain, diarrhea, dysuria, ear pain, headaches, joint pain, joint swelling, nausea, neck pain, plugged ear sensation, rash, sinus pain, sneezing, sore throat, swollen glands, vomiting or wheezing.  Patient is in today for follow up she has been having chest pain that she reports does not radiate that started last night, she started with cold symptoms at the same time she reports. She has chronic fatigue with anemia, she reports she was told to start iron per Mable Paris and she did not start this medication  She has been having more shortness of breath with exertion, more than 2 months she reports. She reports this is separate from her cold symptoms.  She is specifically asking to have her hemoglobin checked today.  Patient did report that she was going to go to the emergency room but she got a nurse friend who said that the wait time of the emergency room was more than 5 hours so she decided not to go and came to the office She felt like she would pass out this morning coming in the building due to weakness and fatigue.  Denies any presyncopal or syncopal episodes.  She is able to eat and drink without any problems. 1/10 chest pain central chest pain without radiation that did start last night with cold symptoms He denies any radiation of pain.  She denies any productive cough.   Patient  denies any fever, body aches,chills, rash,nausea, vomiting, or diarrhea.    Past Medical History:  Diagnosis Date   Arthritis    back, knees, fingers    Bipolar disorder ([place])    Bunion, left    Depression    Diabetes mellitus    diet controlled.    Hypertension    Lower back pain    S/P fall   Squamous cell skin cancer, face    [facility]   Treadmill stress test negative for angina pectoris June 2013    Past Surgical History:  Procedure Laterality Date   BLADDER AUGMENTATION     BLADDER SURGERY     CARDIAC CATHETERIZATION  03/05/12   no stents   CATARACT EXTRACTION W/PHACO Right 07/19/2015   Procedure: CATARACT EXTRACTION PHACO AND INTRAOCULAR LENS PLACEMENT ([place]) right eye;  Surgeon: Ronnell Freshwater, MD;  Location: [facility];  Service: Ophthalmology;  Laterality: Right;  BORDERLINE DIABETIC - oral meds   CATARACT EXTRACTION W/PHACO Left 05/28/2017   Procedure: CATARACT EXTRACTION PHACO AND INTRAOCULAR LENS PLACEMENT ([place]) LEFT DIABETIC;  Surgeon: Leandrew Koyanagi, MD;  Location: [facility];  Service: Ophthalmology;  Laterality: Left;  Diabetic - oal meds   JOINT REPLACEMENT  2012   bilateral hip   KNEE ARTHROSCOPY     TOTAL HIP ARTHROPLASTY  2012   TOTAL KNEE ARTHROPLASTY Right 06/04/2017   Procedure: TOTAL KNEE ARTHROPLASTY;  Surgeon: Lovell Sheehan, MD;  Location: [facility];  Service: Orthopedics;  Laterality: Right;   TUBAL LIGATION     VAGINAL DELIVERY     2    Family History  Problem Relation Age  of Onset   Heart disease Mother    Stroke Mother    Heart disease Father    Dementia Father    Heart disease Maternal Grandmother    Heart disease Maternal Grandfather    Heart disease Paternal Grandmother    Heart disease Paternal Grandfather    Breast cancer Maternal Aunt     Social History   Socioeconomic History   Marital status: Married    Spouse name: Not on file   Number of children: Not on file   Years of education: Not on file   Highest education level: Not on file  Occupational History   Not on file  Tobacco Use   Smoking status: Never   Smokeless tobacco: Never  Vaping Use    Vaping Use: Never used  Substance and Sexual Activity   Alcohol use: Yes    Alcohol/week: 1.0 standard drink    Types: 1 Cans of beer per week    Comment: occassionally   Drug use: No   Sexual activity: Not on file  Other Topics Concern   Not on file  Social History Narrative   Lives in [place] with husband.         Social Determinants of Health   Financial Resource Strain: Medium Risk   Difficulty of Paying Living Expenses: Somewhat hard  Food Insecurity: No Food Insecurity   Worried About Running Out of Food in the Last Year: Never true   Ran Out of Food in the Last Year: Never true  Transportation Needs: No Transportation Needs   Lack of Transportation (Medical): No   Lack of Transportation (Non-Medical): No  Physical Activity: Sufficiently Active   Days of Exercise per Week: 5 days   Minutes of Exercise per Session: 60 min  Stress: No Stress Concern Present   Feeling of Stress : Not at all  Social Connections: Unknown   Frequency of Communication with Friends and Family: More than three times a week   Frequency of Social Gatherings with Friends and Family: More than three times a week   Attends Religious Services: Not on file   Active Member of Clubs or Organizations: Not on file   Attends Club or Organization Meetings: Not on file   Marital Status: Not on file  Intimate Partner Violence: Not At Risk   Fear of Current or Ex-Partner: No   Emotionally Abused: No   Physically Abused: No   Sexually Abused: No    Outpatient Medications Prior to Visit  Medication Sig Dispense Refill   acetaminophen (TYLENOL) 500 MG tablet Take 1,000 mg by mouth every 6 (six) hours as needed (FOR PAIN.).     aspirin EC 81 MG tablet Take 81 mg by mouth daily. Swallow whole.     atorvastatin (LIPITOR) 10 MG tablet Take 1 tablet (10 mg total) by mouth daily. 90 tablet 3   Biotin 5 MG CAPS Take 2 capsules by mouth 2 (two) times daily.      blood glucose meter kit and supplies Dispense  Contour next bayer meter. E11.9 To check blood glucose  Once a day. 1 each 0   buPROPion (WELLBUTRIN XL) 300 MG 24 hr tablet TAKE ONE TABLET BY MOUTH DAILY 30 tablet 1   Cholecalciferol (VITAMIN D3) 1000 units CAPS Take 1,000 Units by mouth daily.      cyanocobalamin (,VITAMIN B-12,) 1000 MCG/ML injection Inject 1 mL (1,000 mcg total) into the muscle every 30 (thirty) days. 5 mL 8   diphenhydramine-acetaminophen (TYLENOL PM) 25-500   MG TABS tablet Take 1 tablet by mouth at bedtime as needed.     donepezil (ARICEPT) 5 MG tablet Take 5 mg by mouth daily.     DULoxetine (CYMBALTA) 30 MG capsule TAKE ONE CAPSULE BY MOUTH DAILY 90 capsule 0   glucose blood (FREESTYLE LITE) test strip Use once daily  as instructed to test blood sugar E 11.9 100 each 3   lamoTRIgine (LAMICTAL) 200 MG tablet TAKE ONE TABLET BY MOUTH TWICE A DAY 180 tablet 1   Lancets (FREESTYLE) lancets Use once daily to check blood sugars  E11.9 100 each 3   losartan (COZAAR) 25 MG tablet TAKE TWO TABLETS BY MOUTH DAILY 180 tablet 1   meloxicam (MOBIC) 15 MG tablet Take 1 tablet (15 mg total) by mouth daily. 30 tablet 0   metFORMIN (GLUCOPHAGE-XR) 750 MG 24 hr tablet TAKE ONE TABLET BY MOUTH DAILY WITH BREAKFAST 90 tablet 1   omeprazole (PRILOSEC) 40 MG capsule TAKE ONE CAPSULE BY MOUTH DAILY 90 capsule 3   Semaglutide,0.25 or 0.5MG/DOS, (OZEMPIC, 0.25 OR 0.5 MG/DOSE,) 2 MG/1.5ML SOPN Inject 0.5 mg into the skin once a week. Inject 0.25 mg weekly for 4 weeks then increase to 0.5 mg weekly 1.5 mL 0   Syringe, Disposable, 1 ML MISC For use with B12 injections 25 each 3   temazepam (RESTORIL) 22.5 MG capsule Take 1 capsule (22.5 mg total) by mouth at bedtime as needed for sleep. 30 capsule 0   tiZANidine (ZANAFLEX) 2 MG tablet Take 1 tablet (2 mg total) by mouth in the morning and at bedtime. (Patient taking differently: Take 2 mg by mouth as needed.) 30 tablet 1   traZODone (DESYREL) 100 MG tablet One tablet by mouth one hour before bedtime  90 tablet 0   vitamin E 1000 UNIT capsule Take 1,000 Units by mouth daily.     No facility-administered medications prior to visit.    Allergies  Allergen Reactions   Thimerosal Itching    Review of Systems  Constitutional:  Positive for fatigue. Negative for activity change, appetite change, chills, diaphoresis, fever and unexpected weight change.  HENT:  Positive for congestion, postnasal drip and rhinorrhea. Negative for dental problem, drooling, ear discharge, ear pain, facial swelling, hearing loss, mouth sores, nosebleeds, sinus pain, sneezing and sore throat.   Respiratory:  Positive for cough and shortness of breath. Negative for choking, chest tightness, wheezing and stridor.   Cardiovascular:  Positive for chest pain. Negative for palpitations and leg swelling.  Gastrointestinal:  Negative for abdominal pain, diarrhea, nausea and vomiting.  Genitourinary: Negative.  Negative for dysuria.  Musculoskeletal: Negative.  Negative for joint pain and neck pain.  Skin:  Negative for rash.  Neurological: Negative.  Negative for headaches.  Hematological: Negative.   Psychiatric/Behavioral: Negative.        Objective:    Physical Exam Constitutional:      General: She is not in acute distress.    Appearance: She is ill-appearing. She is not toxic-appearing or diaphoretic.  HENT:     Head: Normocephalic and atraumatic.     Right Ear: Tympanic membrane, ear canal and external ear normal. There is no impacted cerumen.     Left Ear: Tympanic membrane, ear canal and external ear normal. There is no impacted cerumen.     Nose: Congestion and rhinorrhea present.     Mouth/Throat:     Mouth: Mucous membranes are moist.     Pharynx: No oropharyngeal exudate or posterior oropharyngeal erythema.  Eyes:     General: No scleral icterus.       Right eye: No discharge.        Left eye: No discharge.     Extraocular Movements: Extraocular movements intact.     Conjunctiva/sclera:  Conjunctivae normal.     Pupils: Pupils are equal, round, and reactive to light.  Neck:     Vascular: No carotid bruit.  Cardiovascular:     Rate and Rhythm: Normal rate and regular rhythm.     Pulses: Normal pulses.     Heart sounds: Normal heart sounds.  Pulmonary:     Effort: Pulmonary effort is normal. No respiratory distress.     Breath sounds: Normal breath sounds. No stridor. No wheezing, rhonchi or rales.  Chest:     Chest wall: No tenderness.  Abdominal:     General: Bowel sounds are normal. There is no distension.     Tenderness: There is no abdominal tenderness.  Musculoskeletal:        General: Normal range of motion.     Cervical back: Normal range of motion and neck supple. No rigidity or tenderness.  Lymphadenopathy:     Cervical: No cervical adenopathy.  Skin:    Findings: No erythema or rash.  Neurological:     Mental Status: She is alert and oriented to person, place, and time.     Cranial Nerves: No cranial nerve deficit.     Sensory: No sensory deficit.     Motor: No weakness.     Coordination: Coordination normal.     Gait: Gait normal.     Deep Tendon Reflexes: Reflexes normal.     Comments: Patient is alert and oriented and responsive to questions Engages in eye contact with provider. Speaks in full sentences without any pauses without any shortness of breath or distress.     Psychiatric:        Mood and Affect: Mood normal.        Thought Content: Thought content normal.        Judgment: Judgment normal.    BP (!) 148/86 Comment: recheck  Pulse 96   Temp 98 F (36.7 C) (Oral)   Resp 18   Ht 5' 3" (1.6 m)   Wt 151 lb (68.5 kg)   SpO2 98%   BMI 26.75 kg/m  Wt Readings from Last 3 Encounters:  03/02/21 151 lb (68.5 kg)  01/06/21 160 lb (72.6 kg)  12/14/20 159 lb (72.1 kg)    Health Maintenance Due  Topic Date Due   Zoster Vaccines- Shingrix (2 of 2) 01/15/2019   Pneumonia Vaccine 65+ Years old (3 - PPSV23 if available, else PCV20)  04/21/2020   COVID-19 Vaccine (4 - Booster for Pfizer series) 05/12/2020   MAMMOGRAM  01/04/2021    There are no preventive care reminders to display for this patient.   Lab Results  Component Value Date   TSH 0.93 04/16/2020   Lab Results  Component Value Date   WBC 6.2 01/06/2021   HGB 10.4 (L) 01/06/2021   HCT 32.7 (L) 01/06/2021   MCV 80.8 01/06/2021   PLT 377.0 01/06/2021   Lab Results  Component Value Date   NA 135 01/06/2021   K 4.6 01/06/2021   CO2 27 01/06/2021   GLUCOSE 105 (H) 01/06/2021   BUN 12 01/06/2021   CREATININE 0.71 01/06/2021   BILITOT 0.4 01/06/2021   ALKPHOS 97 01/06/2021   AST 14 01/06/2021   ALT 12 01/06/2021     PROT 6.8 01/06/2021   ALBUMIN 4.4 01/06/2021   CALCIUM 9.8 01/06/2021   ANIONGAP 9 08/19/2018   GFR 87.08 01/06/2021   Lab Results  Component Value Date   CHOL 199 01/06/2021   Lab Results  Component Value Date   HDL 107.50 01/06/2021   Lab Results  Component Value Date   LDLCALC 72 01/06/2021   Lab Results  Component Value Date   TRIG 100.0 01/06/2021   Lab Results  Component Value Date   CHOLHDL 2 01/06/2021   Lab Results  Component Value Date   HGBA1C 6.6 (H) 01/06/2021       Assessment & Plan:   Problem List Items Addressed This Visit       Other   Iron deficiency anemia   Acute cough - Primary   Relevant Orders   EKG 12-Lead   CBC with Differential/Platelet   CK Total (and CKMB)   Troponin I (High Sensitivity)   Iron and TIBC   Comprehensive metabolic panel   Lab test positive for detection of COVID-19 virus   Relevant Medications   molnupiravir EUA (LAGEVRIO) 200 mg CAPS capsule   Shortness of breath   History of chest pain   She has had 1 day of symptoms of cold like upper respiratory symptoms, she does positive for COVID point-of-care test today flu is negative. Patient's blood pressure is elevated in the office today recheck 148/86.  Heart rate recheck is 96.  EKG does show sinus and  likely due to infectious process.  Patient will have follow-up with the office for regular scheduled primary care.  She is advised that it would be best for her to go to the emergency room today for further work-up however she declines. Will check labs.  Questionable worsening anemia since patient not been taking iron supplement.  Mild 1 out of 10 chest pain started with COVID symptoms.  Pain does not radiate per patient  Meds ordered this encounter  Medications   molnupiravir EUA (LAGEVRIO) 200 mg CAPS capsule    Sig: Take 4 capsules (800 mg total) by mouth 2 (two) times daily for 5 days.    Dispense:  40 capsule    Refill:  0  Discussed possible side effects of medication as above.  Red Flags discussed. The patient was given clear instructions to go to ER or return to medical center if any red flags develop, symptoms do not improve, worsen or new problems develop. They verbalized understanding.  Follow-up regarding anemia and shortness of breath.  May need further work-up Return in about 2 weeks (around 03/16/2021), or if symptoms worsen or fail to improve, for at any time for any worsening symptoms, Go to Emergency room/ urgent care if worse.    Smith , FNP  

## 2021-03-03 NOTE — Telephone Encounter (Signed)
Pt is requesting lab results.

## 2021-03-03 NOTE — Telephone Encounter (Signed)
Patient called for lab results.

## 2021-03-03 NOTE — Progress Notes (Signed)
There was a delay with [facility] and stat labs.  Iron levels have decreased since one month ago, hemoglobin stable slightly improved.  She was covid positive for acute visit with me yesterday. She noted she was given iron and just had not started it - please verify this and dose she has. She will need to follow up with Dr. Derrel Nip as below as well once able to come in. Verify if she is having any bleeding. If any covid symptoms worsening please advise her to be seen immediately.   Per Dr. Derrel Nip note she will need follow up :.Marland KitchenMarland KitchenMarland KitchenHowever,  Your  labs indicate your iron stores are  low and this is causing you to be anemic .   We need to determine [facility] are iron deficient,  and this will require a fecal occult blood test and a GI referral for EGD and colonoscopy to rule out colon CA . Please schedule a follow up with me to discuss and arrange all of this.

## 2021-03-03 NOTE — Progress Notes (Signed)
Stat labs still not back, CBC showing collected but does not show pending as well.

## 2021-03-04 ENCOUNTER — Ambulatory Visit (HOSPITAL_COMMUNITY): Admission: RE | Admit: 2021-03-04 | Payer: Medicare HMO | Source: Ambulatory Visit

## 2021-03-04 NOTE — Telephone Encounter (Signed)
See lab result note message

## 2021-03-08 ENCOUNTER — Ambulatory Visit: Payer: Medicare HMO | Admitting: Internal Medicine

## 2021-03-10 ENCOUNTER — Telehealth: Payer: Self-pay | Admitting: Pharmacist

## 2021-03-10 NOTE — Telephone Encounter (Signed)
Patient's supply of Ozempic assistance arrived to the office.   Ozempic 2 mg/1.5 mL: 4 boxes   Patient notified. She will come pick up when she is out of quarantine

## 2021-03-12 ENCOUNTER — Ambulatory Visit (HOSPITAL_COMMUNITY): Payer: Medicare HMO

## 2021-03-17 ENCOUNTER — Ambulatory Visit (HOSPITAL_COMMUNITY)
Admission: RE | Admit: 2021-03-17 | Discharge: 2021-03-17 | Disposition: A | Discharge status: Home / Self Care | Payer: Medicare HMO | Source: Ambulatory Visit | Attending: Neurology | Admitting: Neurology

## 2021-03-17 ENCOUNTER — Other Ambulatory Visit: Payer: Self-pay

## 2021-03-17 ENCOUNTER — Other Ambulatory Visit: Payer: Self-pay | Admitting: Internal Medicine

## 2021-03-17 DIAGNOSIS — R413 Other amnesia: Secondary | ICD-10-CM | POA: Insufficient documentation

## 2021-03-17 DIAGNOSIS — G319 Degenerative disease of nervous system, unspecified: Secondary | ICD-10-CM | POA: Diagnosis not present

## 2021-03-17 DIAGNOSIS — I639 Cerebral infarction, unspecified: Secondary | ICD-10-CM | POA: Diagnosis not present

## 2021-03-17 DIAGNOSIS — J3489 Other specified disorders of nose and nasal sinuses: Secondary | ICD-10-CM | POA: Diagnosis not present

## 2021-03-18 ENCOUNTER — Emergency Department: Payer: Medicare HMO

## 2021-03-18 ENCOUNTER — Emergency Department
Admission: EM | Admit: 2021-03-18 | Discharge: 2021-03-18 | Disposition: A | Discharge status: Home / Self Care | Payer: Medicare HMO | Attending: Emergency Medicine | Admitting: Emergency Medicine

## 2021-03-18 ENCOUNTER — Encounter: Payer: Self-pay | Admitting: Emergency Medicine

## 2021-03-18 DIAGNOSIS — R519 Headache, unspecified: Secondary | ICD-10-CM | POA: Diagnosis not present

## 2021-03-18 DIAGNOSIS — Z5321 Procedure and treatment not carried out due to patient leaving prior to being seen by health care provider: Secondary | ICD-10-CM | POA: Insufficient documentation

## 2021-03-18 DIAGNOSIS — U071 COVID-19: Secondary | ICD-10-CM | POA: Insufficient documentation

## 2021-03-18 DIAGNOSIS — R0602 Shortness of breath: Secondary | ICD-10-CM | POA: Diagnosis not present

## 2021-03-18 LAB — COMPREHENSIVE METABOLIC PANEL
ALT: 24 U/L (ref 0–44)
AST: 23 U/L (ref 15–41)
Albumin: 4.4 g/dL (ref 3.5–5.0)
Alkaline Phosphatase: 87 U/L (ref 38–126)
Anion gap: 6 (ref 5–15)
BUN: 17 mg/dL (ref 8–23)
CO2: 29 mmol/L (ref 22–32)
Calcium: 9.8 mg/dL (ref 8.9–10.3)
Chloride: 103 mmol/L (ref 98–111)
Creatinine, Ser: 0.67 mg/dL (ref 0.44–1.00)
GFR, Estimated: >60 mL/min (ref >60)
Glucose, Bld: 112 mg/dL — ABNORMAL HIGH (ref 70–99)
Potassium: 4.5 mmol/L (ref 3.5–5.1)
Sodium: 138 mmol/L (ref 135–145)
Total Bilirubin: 0.6 mg/dL (ref 0.3–1.2)
Total Protein: 7.9 g/dL (ref 6.5–8.1)

## 2021-03-18 LAB — CBC WITH DIFFERENTIAL/PLATELET
Abs Immature Granulocytes: 0.05 10*3/uL (ref 0.00–0.07)
Basophils Absolute: 0.1 10*3/uL (ref 0.0–0.1)
Basophils Relative: 1 %
Eosinophils Absolute: 0.1 10*3/uL (ref 0.0–0.5)
Eosinophils Relative: 1 %
HCT: 39.3 % (ref 36.0–46.0)
Hemoglobin: 12 g/dL (ref 12.0–15.0)
Immature Granulocytes: 1 %
Lymphocytes Relative: 47 %
Lymphs Abs: 3.3 10*3/uL (ref 0.7–4.0)
MCH: 25.3 pg — ABNORMAL LOW (ref 26.0–34.0)
MCHC: 30.5 g/dL (ref 30.0–36.0)
MCV: 82.9 fL (ref 80.0–100.0)
Monocytes Absolute: 0.5 10*3/uL (ref 0.1–1.0)
Monocytes Relative: 7 %
Neutro Abs: 3 10*3/uL (ref 1.7–7.7)
Neutrophils Relative %: 43 %
Platelets: 402 10*3/uL — ABNORMAL HIGH (ref 150–400)
RBC: 4.74 MIL/uL (ref 3.87–5.11)
RDW: 16.6 % — ABNORMAL HIGH (ref 11.5–15.5)
WBC: 7 10*3/uL (ref 4.0–10.5)
nRBC: 0 % (ref 0.0–0.2)

## 2021-03-18 LAB — TROPONIN I (HIGH SENSITIVITY)
Troponin I (High Sensitivity): 6 ng/L (ref <18)
Troponin I (High Sensitivity): 7 ng/L (ref <18)

## 2021-03-18 LAB — RESP PANEL BY RT-PCR (FLU A&B, COVID) ARPGX2
Influenza A by PCR: NEGATIVE
Influenza B by PCR: NEGATIVE
SARS Coronavirus 2 by RT PCR: POSITIVE — AB

## 2021-03-18 NOTE — ED Notes (Signed)
Pt stated she did not want to wait any longer.  This RN advised risks of leaving without further eval but pt states she still wants to leave.

## 2021-03-18 NOTE — ED Provider Notes (Signed)
°  Emergency Medicine Provider Triage Evaluation Note  Katherine Jimenez , a 70 y.o.female,  was evaluated in triage.  Pt complains of abnormal MRI finding.  Patient states that she was diagnosed with COVID 3 weeks ago.  Since then she has not felt well, endorsing some shortness of breath.  When she went for a MRI to evaluate her dementia, the neurologist found findings that she may have experienced a stroke.  He directed her to report to the emergency department for further inpatient evaluation.  Denies any other active symptoms at this time.   Review of Systems  Positive: Weakness, shortness of breath. Negative: Denies fever, chest pain, vomiting  Physical Exam   Vitals:   03/18/21 1222  BP: (!) 154/111  Pulse: (!) 119  Resp: 17  Temp: 99.1 F (37.3 C)  SpO2: 96%   Gen:   Awake, no distress   Resp:  Normal effort  MSK:   Moves extremities without difficulty  Other:  No acute neurological deficits.  Medical Decision Making  Given the patient's initial medical screening exam, the following diagnostic evaluation has been ordered. The patient will be placed in the appropriate treatment space, once one is available, to complete the evaluation and treatment. I have discussed the plan of care with the patient and I have advised the patient that an ED physician or mid-level practitioner will reevaluate their condition after the test results have been received, as the results may give them additional insight into the type of treatment they may need.    Diagnostics: Labs, EKG, CXR  Treatments: none immediately   Teodoro Spray, [place] 03/18/21 1250    Vanessa [place], MD 03/18/21 [phone]

## 2021-03-18 NOTE — ED Triage Notes (Signed)
Pt via POV from home. Pt was sent over by her PCP states that her MRI from yesterday showed a stroke. Denies any stroke like symptoms. Pt does endorse mild headache. States she had chest pressure for the past 3 weeks. States she was recovering from [place]. Pt is A&OX4 and NAD.

## 2021-03-20 ENCOUNTER — Encounter: Payer: Self-pay | Admitting: Internal Medicine

## 2021-03-21 ENCOUNTER — Observation Stay
Admission: EM | Admit: 2021-03-21 | Discharge: 2021-03-22 | Disposition: A | Discharge status: Home / Self Care | Payer: Medicare HMO | Attending: Internal Medicine | Admitting: Internal Medicine

## 2021-03-21 ENCOUNTER — Encounter: Payer: Self-pay | Admitting: Adult Health

## 2021-03-21 ENCOUNTER — Emergency Department: Payer: Medicare HMO

## 2021-03-21 ENCOUNTER — Other Ambulatory Visit: Payer: Self-pay

## 2021-03-21 ENCOUNTER — Ambulatory Visit (INDEPENDENT_AMBULATORY_CARE_PROVIDER_SITE_OTHER): Payer: Medicare HMO | Admitting: Adult Health

## 2021-03-21 VITALS — BP 134/80 | HR 101 | Ht 60.0 in | Wt 148.6 lb

## 2021-03-21 DIAGNOSIS — U071 COVID-19: Secondary | ICD-10-CM | POA: Insufficient documentation

## 2021-03-21 DIAGNOSIS — Z794 Long term (current) use of insulin: Secondary | ICD-10-CM | POA: Diagnosis not present

## 2021-03-21 DIAGNOSIS — E119 Type 2 diabetes mellitus without complications: Secondary | ICD-10-CM | POA: Insufficient documentation

## 2021-03-21 DIAGNOSIS — Y900 Blood alcohol level of less than 20 mg/100 ml: Secondary | ICD-10-CM | POA: Diagnosis not present

## 2021-03-21 DIAGNOSIS — N3000 Acute cystitis without hematuria: Secondary | ICD-10-CM | POA: Diagnosis not present

## 2021-03-21 DIAGNOSIS — R413 Other amnesia: Secondary | ICD-10-CM

## 2021-03-21 DIAGNOSIS — R69 Illness, unspecified: Secondary | ICD-10-CM | POA: Diagnosis not present

## 2021-03-21 DIAGNOSIS — R4702 Dysphasia: Secondary | ICD-10-CM | POA: Diagnosis not present

## 2021-03-21 DIAGNOSIS — I10 Essential (primary) hypertension: Secondary | ICD-10-CM | POA: Diagnosis not present

## 2021-03-21 DIAGNOSIS — Z8616 Personal history of COVID-19: Secondary | ICD-10-CM | POA: Insufficient documentation

## 2021-03-21 DIAGNOSIS — I639 Cerebral infarction, unspecified: Secondary | ICD-10-CM | POA: Diagnosis not present

## 2021-03-21 DIAGNOSIS — I6981 Attention and concentration deficit following other cerebrovascular disease: Secondary | ICD-10-CM | POA: Diagnosis present

## 2021-03-21 DIAGNOSIS — Z79899 Other long term (current) drug therapy: Secondary | ICD-10-CM | POA: Diagnosis not present

## 2021-03-21 DIAGNOSIS — G3184 Mild cognitive impairment, so stated: Secondary | ICD-10-CM | POA: Insufficient documentation

## 2021-03-21 DIAGNOSIS — R29818 Other symptoms and signs involving the nervous system: Secondary | ICD-10-CM | POA: Diagnosis not present

## 2021-03-21 DIAGNOSIS — Z96651 Presence of right artificial knee joint: Secondary | ICD-10-CM | POA: Insufficient documentation

## 2021-03-21 DIAGNOSIS — Z85828 Personal history of other malignant neoplasm of skin: Secondary | ICD-10-CM | POA: Insufficient documentation

## 2021-03-21 DIAGNOSIS — Z7984 Long term (current) use of oral hypoglycemic drugs: Secondary | ICD-10-CM | POA: Diagnosis not present

## 2021-03-21 DIAGNOSIS — E785 Hyperlipidemia, unspecified: Secondary | ICD-10-CM

## 2021-03-21 DIAGNOSIS — R930 Abnormal findings on diagnostic imaging of skull and head, not elsewhere classified: Secondary | ICD-10-CM | POA: Diagnosis not present

## 2021-03-21 DIAGNOSIS — Z7982 Long term (current) use of aspirin: Secondary | ICD-10-CM | POA: Insufficient documentation

## 2021-03-21 DIAGNOSIS — I672 Cerebral atherosclerosis: Secondary | ICD-10-CM | POA: Diagnosis not present

## 2021-03-21 DIAGNOSIS — Z8673 Personal history of transient ischemic attack (TIA), and cerebral infarction without residual deficits: Secondary | ICD-10-CM | POA: Insufficient documentation

## 2021-03-21 DIAGNOSIS — R419 Unspecified symptoms and signs involving cognitive functions and awareness: Secondary | ICD-10-CM | POA: Diagnosis not present

## 2021-03-21 DIAGNOSIS — I6501 Occlusion and stenosis of right vertebral artery: Secondary | ICD-10-CM | POA: Diagnosis not present

## 2021-03-21 DIAGNOSIS — I6523 Occlusion and stenosis of bilateral carotid arteries: Secondary | ICD-10-CM | POA: Diagnosis not present

## 2021-03-21 LAB — DIFFERENTIAL
Abs Immature Granulocytes: 0.04 10*3/uL (ref 0.00–0.07)
Basophils Absolute: 0.1 10*3/uL (ref 0.0–0.1)
Basophils Relative: 1 %
Eosinophils Absolute: 0.1 10*3/uL (ref 0.0–0.5)
Eosinophils Relative: 1 %
Immature Granulocytes: 1 %
Lymphocytes Relative: 44 %
Lymphs Abs: 3.7 10*3/uL (ref 0.7–4.0)
Monocytes Absolute: 0.6 10*3/uL (ref 0.1–1.0)
Monocytes Relative: 7 %
Neutro Abs: 4 10*3/uL (ref 1.7–7.7)
Neutrophils Relative %: 46 %

## 2021-03-21 LAB — URINALYSIS, ROUTINE W REFLEX MICROSCOPIC
Bilirubin Urine: NEGATIVE
Glucose, UA: NEGATIVE mg/dL
Hgb urine dipstick: NEGATIVE
Ketones, ur: NEGATIVE mg/dL
Nitrite: NEGATIVE
Protein, ur: NEGATIVE mg/dL
Specific Gravity, Urine: 1.012 (ref 1.005–1.030)
WBC, UA: >50 WBC/hpf — ABNORMAL HIGH (ref 0–5)
pH: 7 (ref 5.0–8.0)

## 2021-03-21 LAB — URINE DRUG SCREEN, QUALITATIVE (ARMC ONLY)
Amphetamines, Ur Screen: NOT DETECTED
Barbiturates, Ur Screen: NOT DETECTED
Benzodiazepine, Ur Scrn: POSITIVE — AB
Cannabinoid 50 Ng, Ur ~~LOC~~: NOT DETECTED
Cocaine Metabolite,Ur ~~LOC~~: NOT DETECTED
MDMA (Ecstasy)Ur Screen: NOT DETECTED
Methadone Scn, Ur: NOT DETECTED
Opiate, Ur Screen: NOT DETECTED
Phencyclidine (PCP) Ur S: NOT DETECTED
Tricyclic, Ur Screen: NOT DETECTED

## 2021-03-21 LAB — COMPREHENSIVE METABOLIC PANEL
ALT: 23 U/L (ref 0–44)
AST: 20 U/L (ref 15–41)
Albumin: 4.7 g/dL (ref 3.5–5.0)
Alkaline Phosphatase: 91 U/L (ref 38–126)
Anion gap: 9 (ref 5–15)
BUN: 13 mg/dL (ref 8–23)
CO2: 27 mmol/L (ref 22–32)
Calcium: 9.5 mg/dL (ref 8.9–10.3)
Chloride: 99 mmol/L (ref 98–111)
Creatinine, Ser: 0.79 mg/dL (ref 0.44–1.00)
GFR, Estimated: >60 mL/min (ref >60)
Glucose, Bld: 117 mg/dL — ABNORMAL HIGH (ref 70–99)
Potassium: 4 mmol/L (ref 3.5–5.1)
Sodium: 135 mmol/L (ref 135–145)
Total Bilirubin: 0.6 mg/dL (ref 0.3–1.2)
Total Protein: 7.7 g/dL (ref 6.5–8.1)

## 2021-03-21 LAB — CBC
HCT: 38.2 % (ref 36.0–46.0)
Hemoglobin: 11.7 g/dL — ABNORMAL LOW (ref 12.0–15.0)
MCH: 25.9 pg — ABNORMAL LOW (ref 26.0–34.0)
MCHC: 30.6 g/dL (ref 30.0–36.0)
MCV: 84.5 fL (ref 80.0–100.0)
Platelets: 372 10*3/uL (ref 150–400)
RBC: 4.52 MIL/uL (ref 3.87–5.11)
RDW: 17 % — ABNORMAL HIGH (ref 11.5–15.5)
WBC: 8.5 10*3/uL (ref 4.0–10.5)
nRBC: 0 % (ref 0.0–0.2)

## 2021-03-21 LAB — APTT: aPTT: 29 seconds (ref 24–36)

## 2021-03-21 LAB — LIPID PANEL
Cholesterol: 250 mg/dL — ABNORMAL HIGH (ref 0–200)
HDL: 113 mg/dL (ref >40)
LDL Cholesterol: 111 mg/dL — ABNORMAL HIGH (ref 0–99)
Total CHOL/HDL Ratio: 2.2 RATIO
Triglycerides: 130 mg/dL (ref <150)
VLDL: 26 mg/dL (ref 0–40)

## 2021-03-21 LAB — PROTIME-INR
INR: 1 (ref 0.8–1.2)
Prothrombin Time: 13.1 seconds (ref 11.4–15.2)

## 2021-03-21 LAB — ETHANOL: Alcohol, Ethyl (B): <10 mg/dL (ref <10)

## 2021-03-21 MED ORDER — VITAMIN D 25 MCG (1000 UNIT) PO TABS
1000.0000 [IU] | ORAL_TABLET | Freq: Every day | ORAL | Status: DC
  Administered 2021-03-22: 1000 [IU] via ORAL
  Filled 2021-03-21: qty 1

## 2021-03-21 MED ORDER — INSULIN ASPART 100 UNIT/ML IJ SOLN
0.0000 [IU] | Freq: Three times a day (TID) | INTRAMUSCULAR | Status: DC
Start: 2021-03-22 — End: 2021-03-22
  Administered 2021-03-22: 3 [IU] via SUBCUTANEOUS
  Filled 2021-03-21: qty 1

## 2021-03-21 MED ORDER — ASPIRIN EC 81 MG PO TBEC
81.0000 mg | DELAYED_RELEASE_TABLET | Freq: Every day | ORAL | Status: DC
  Administered 2021-03-21: 81 mg via ORAL
  Filled 2021-03-21 (×2): qty 1

## 2021-03-21 MED ORDER — ENOXAPARIN SODIUM 40 MG/0.4ML IJ SOSY
40.0000 mg | PREFILLED_SYRINGE | INTRAMUSCULAR | Status: DC
  Administered 2021-03-21: 40 mg via SUBCUTANEOUS
  Filled 2021-03-21: qty 0.4

## 2021-03-21 MED ORDER — IOHEXOL 350 MG/ML SOLN
75.0000 mL | Freq: Once | INTRAVENOUS | Status: AC | PRN
  Administered 2021-03-21: 75 mL via INTRAVENOUS
  Filled 2021-03-21: qty 75

## 2021-03-21 MED ORDER — VITAMIN E 45 MG (100 UNIT) PO CAPS
800.0000 [IU] | ORAL_CAPSULE | Freq: Every day | ORAL | Status: DC
Start: 2021-03-22 — End: 2021-03-22
  Administered 2021-03-22: 800 [IU] via ORAL
  Filled 2021-03-21: qty 8

## 2021-03-21 MED ORDER — ONDANSETRON HCL 4 MG/2ML IJ SOLN
4.0000 mg | Freq: Four times a day (QID) | INTRAMUSCULAR | Status: DC | PRN
  Administered 2021-03-22: 4 mg via INTRAVENOUS
  Filled 2021-03-21: qty 2

## 2021-03-21 MED ORDER — TRAZODONE HCL 100 MG PO TABS
100.0000 mg | ORAL_TABLET | Freq: Every day | ORAL | Status: DC
  Administered 2021-03-21: 100 mg via ORAL
  Filled 2021-03-21: qty 1

## 2021-03-21 MED ORDER — PANTOPRAZOLE SODIUM 40 MG PO TBEC
40.0000 mg | DELAYED_RELEASE_TABLET | Freq: Every day | ORAL | Status: DC
  Administered 2021-03-22: 40 mg via ORAL
  Filled 2021-03-21: qty 1

## 2021-03-21 MED ORDER — ATORVASTATIN CALCIUM 20 MG PO TABS: 10.0000 mg | ORAL_TABLET | Freq: Every day | ORAL | Status: DC

## 2021-03-21 MED ORDER — VITAMIN E 45 MG (100 UNIT) PO CAPS
200.0000 [IU] | ORAL_CAPSULE | Freq: Every day | ORAL | Status: DC
Start: 2021-03-22 — End: 2021-03-22
  Administered 2021-03-22: 200 [IU] via ORAL
  Filled 2021-03-21: qty 2

## 2021-03-21 MED ORDER — ONDANSETRON HCL 4 MG PO TABS: 4.0000 mg | ORAL_TABLET | Freq: Four times a day (QID) | ORAL | Status: DC | PRN

## 2021-03-21 MED ORDER — SODIUM CHLORIDE 0.9 % IV SOLN
1.0000 g | INTRAVENOUS | Status: DC
  Administered 2021-03-21: 1 g via INTRAVENOUS
  Filled 2021-03-21: qty 10

## 2021-03-21 MED ORDER — DULOXETINE HCL 30 MG PO CPEP
30.0000 mg | ORAL_CAPSULE | Freq: Every day | ORAL | Status: DC
  Administered 2021-03-22: 30 mg via ORAL
  Filled 2021-03-21: qty 1

## 2021-03-21 MED ORDER — BUPROPION HCL ER (XL) 150 MG PO TB24
300.0000 mg | ORAL_TABLET | Freq: Every day | ORAL | Status: DC
  Administered 2021-03-22: 300 mg via ORAL
  Filled 2021-03-21: qty 2

## 2021-03-21 MED ORDER — VITAMIN E 45 MG (100 UNIT) PO CAPS: 1000.0000 [IU] | ORAL_CAPSULE | Freq: Every day | ORAL | Status: DC

## 2021-03-21 MED ORDER — ACETAMINOPHEN 325 MG PO TABS
650.0000 mg | ORAL_TABLET | Freq: Four times a day (QID) | ORAL | Status: DC | PRN
  Administered 2021-03-21: 650 mg via ORAL
  Filled 2021-03-21: qty 2

## 2021-03-21 MED ORDER — STROKE: EARLY STAGES OF RECOVERY BOOK: Freq: Once | Status: DC

## 2021-03-21 MED ORDER — SODIUM CHLORIDE 0.9 % IV SOLN: INTRAVENOUS | Status: DC

## 2021-03-21 MED ORDER — LOSARTAN POTASSIUM 50 MG PO TABS
50.0000 mg | ORAL_TABLET | Freq: Every day | ORAL | Status: DC
  Administered 2021-03-22: 50 mg via ORAL
  Filled 2021-03-21: qty 1

## 2021-03-21 MED ORDER — CYANOCOBALAMIN 1000 MCG/ML IJ SOLN: 1000.0000 ug | INTRAMUSCULAR | Status: DC

## 2021-03-21 MED ORDER — BIOTIN 5 MG PO CAPS: 2.0000 | ORAL_CAPSULE | Freq: Two times a day (BID) | ORAL | Status: DC

## 2021-03-21 MED ORDER — DONEPEZIL HCL 5 MG PO TABS
5.0000 mg | ORAL_TABLET | Freq: Every day | ORAL | Status: DC
  Administered 2021-03-22: 5 mg via ORAL
  Filled 2021-03-21: qty 1

## 2021-03-21 MED ORDER — MAGNESIUM HYDROXIDE 400 MG/5ML PO SUSP: 30.0000 mL | Freq: Every day | ORAL | Status: DC | PRN

## 2021-03-21 MED ORDER — ACETAMINOPHEN 650 MG RE SUPP: 650.0000 mg | Freq: Four times a day (QID) | RECTAL | Status: DC | PRN

## 2021-03-21 MED ORDER — TEMAZEPAM 7.5 MG PO CAPS
22.5000 mg | ORAL_CAPSULE | Freq: Every evening | ORAL | Status: DC | PRN
  Administered 2021-03-21: 22.5 mg via ORAL
  Filled 2021-03-21: qty 1

## 2021-03-21 MED ORDER — LAMOTRIGINE 100 MG PO TABS
200.0000 mg | ORAL_TABLET | Freq: Two times a day (BID) | ORAL | Status: DC
  Administered 2021-03-21 – 2021-03-22 (×2): 200 mg via ORAL
  Filled 2021-03-21 (×2): qty 2

## 2021-03-21 NOTE — H&P (Addendum)
[place]   PATIENT NAME: Katherine Jimenez    MR#:  [id number]  DATE OF BIRTH:  Aug 27, 1950  DATE OF ADMISSION:  03/21/2021  PRIMARY CARE PHYSICIAN: Crecencio Mc, MD   Patient is coming from: Home  REQUESTING/REFERRING PHYSICIAN: Hulan Saas, MD  CHIEF COMPLAINT:   Chief Complaint  Patient presents with   stroke work up    HISTORY OF PRESENT ILLNESS:  Katherine Jimenez is a 70 y.o. Caucasian female with medical history significant for type 2 diabetes mellitus, hypertension, depression with bipolar disorder, who presented to the ER with acute onset of CVA that was seen on MRI on 12/15 by her neurologist who advised her to come to the ER for further work-up on 12/16.  She waited 7 hours and left.  She was advised by her neurologist to take her statin therapy and aspirin.  She was then seen by her PCP today who referred her to the ER. She denied any paresthesias or focal muscle weakness.  No tinnitus or vertigo.  No headaches or dizziness or blurred vision or diplopia.  She has been having worsening memory deficits over the last couple of weeks.  She has been sometimes mixing words.  She was diagnosed with COVID-19 2 weeks ago.  She then had dyspnea and cough with chest congestion which have improved.  Is remaining she mild dyspnea on exertion though.  No chest pain or palpitations.  No nausea or vomiting or abdominal pain.  No dysuria, oliguria or hematuria, urgency or frequency or flank pain.  She takes a baby aspirin daily.  ED Course: Upon presentation to the ER, BP was 134/80 and later 166/97 with heart rate 101 and later 107 and otherwise normal vital signs.  Labs reviewed unremarkable CMP.  Total cholesterol was 250 and LDL 111 with triglycerides of 130 HDL of 113.  CBC showed hemoglobin of 11.7 with hematocrit of 30.2.  Coagulation profile was within normal.  UA showed more than 50 WBCs and large leukocytes with rare bacteria.  Urine drug screen came back positive for  benzodiazepine.  EKG as reviewed by me : Showed normal sinus rhythm with a rate of 93. Imaging: MRI of the brain without contrast on 12/15 revealed the following, 7 mm acute infarct within the right external capsule.   Moderate/advanced chronic small-vessel ischemic changes within the cerebral white matter, slightly progressed from the brain MRI of 11/16/2018. Minimal chronic small-vessel ischemic changes are also present within the pons.   Volumetric analysis of the brain was performed, with a fully detailed report [facility]. Briefly, the comparison with age and gender matched reference reveals a whole-brain volume normative percentile of 7. However, this degree of volume loss is not appreciated subjectively.   Paranasal sinus disease, as described. Correlate for acute sinusitis.   Small left mastoid effusion  CT HEAD IMPRESSION:  1. Previously identified subcentimeter infarct involving the right external capsule not well visualized by CT. 2. No other acute intracranial abnormality. 3. Underlying atrophy with moderate chronic microvascular ischemic disease.   CTA HEAD AND NECK IMPRESSION:   1. Negative CTA for large vessel occlusion. 2. Mild for age atheromatous change about the carotid bifurcations and carotid siphons without hemodynamically significant stenosis.     The patient will be admitted to an observation medical telemetry bed for further evaluation and monitoring. PAST MEDICAL HISTORY:   Past Medical History:  Diagnosis Date   Arthritis    back, knees, fingers   Bipolar  disorder ([place])    Bunion, left    Depression    Diabetes mellitus    diet controlled.    Hypertension    Lower back pain    S/P fall   Squamous cell skin cancer, face    [facility]   Treadmill stress test negative for angina pectoris June 2013    PAST SURGICAL HISTORY:   Past Surgical History:  Procedure Laterality Date   BLADDER AUGMENTATION     BLADDER SURGERY     CARDIAC  CATHETERIZATION  03/05/12   no stents   CATARACT EXTRACTION W/PHACO Right 07/19/2015   Procedure: CATARACT EXTRACTION PHACO AND INTRAOCULAR LENS PLACEMENT ([place]) right eye;  Surgeon: Ronnell Freshwater, MD;  Location: [place];  Service: Ophthalmology;  Laterality: Right;  BORDERLINE DIABETIC - oral meds   CATARACT EXTRACTION W/PHACO Left 05/28/2017   Procedure: CATARACT EXTRACTION PHACO AND INTRAOCULAR LENS PLACEMENT ([place]) LEFT DIABETIC;  Surgeon: Leandrew Koyanagi, MD;  Location: [place];  Service: Ophthalmology;  Laterality: Left;  Diabetic - oal meds   JOINT REPLACEMENT  2012   bilateral hip   KNEE ARTHROSCOPY     TOTAL HIP ARTHROPLASTY  2012   TOTAL KNEE ARTHROPLASTY Right 06/04/2017   Procedure: TOTAL KNEE ARTHROPLASTY;  Surgeon: Lovell Sheehan, MD;  Location: [facility];  Service: Orthopedics;  Laterality: Right;   TUBAL LIGATION     VAGINAL DELIVERY     2    SOCIAL HISTORY:   Social History   Tobacco Use   Smoking status: Never   Smokeless tobacco: Never  Substance Use Topics   Alcohol use: Yes    Alcohol/week: 1.0 standard drink    Types: 1 Cans of beer per week    Comment: occassionally    FAMILY HISTORY:   Family History  Problem Relation Age of Onset   Heart disease Mother    Stroke Mother    Heart disease Father    Dementia Father    Heart disease Maternal Grandmother    Heart disease Maternal Grandfather    Heart disease Paternal Grandmother    Heart disease Paternal Grandfather    Breast cancer Maternal Aunt     DRUG ALLERGIES:   Allergies  Allergen Reactions   Thimerosal Itching    REVIEW OF SYSTEMS:   ROS As per history of present illness. All pertinent systems were reviewed above. Constitutional, HEENT, cardiovascular, respiratory, GI, GU, musculoskeletal, neuro, psychiatric, endocrine, integumentary and hematologic systems were reviewed and are otherwise negative/unremarkable except for positive findings mentioned  above in the HPI.   MEDICATIONS AT HOME:   Prior to Admission medications   Medication Sig Start Date End Date Taking? Authorizing Provider  acetaminophen (TYLENOL) 500 MG tablet Take 1,000 mg by mouth every 6 (six) hours as needed (FOR PAIN.).    [provider]  aspirin EC 81 MG tablet Take 81 mg by mouth daily. Swallow whole.    [provider]  atorvastatin (LIPITOR) 10 MG tablet Take 1 tablet (10 mg total) by mouth daily. 04/23/20   Crecencio Mc, MD  Biotin 5 MG CAPS Take 2 capsules by mouth 2 (two) times daily.     [provider]  blood glucose meter kit and supplies Dispense Contour next bayer meter. E11.9 To check blood glucose  Once a day. 08/31/15   Crecencio Mc, MD  buPROPion (WELLBUTRIN XL) 300 MG 24 hr tablet TAKE ONE TABLET BY MOUTH DAILY 02/27/21   Crecencio Mc, MD  Cholecalciferol (VITAMIN D3) 1000 units CAPS Take 1,000 Units by mouth daily.     [provider]  cyanocobalamin (,VITAMIN B-12,) 1000 MCG/ML injection Inject 1 mL (1,000 mcg total) into the muscle every 30 (thirty) days. 04/23/20   Crecencio Mc, MD  diphenhydramine-acetaminophen (TYLENOL PM) 25-500 MG TABS tablet Take 1 tablet by mouth at bedtime as needed.    [provider]  donepezil (ARICEPT) 5 MG tablet Take 5 mg by mouth daily. 02/01/21   [provider]  DULoxetine (CYMBALTA) 30 MG capsule TAKE ONE CAPSULE BY MOUTH DAILY 03/17/21   Crecencio Mc, MD  glucose blood (FREESTYLE LITE) test strip Use once daily  as instructed to test blood sugar E 11.9 04/29/14   Crecencio Mc, MD  lamoTRIgine (LAMICTAL) 200 MG tablet TAKE ONE TABLET BY MOUTH TWICE A DAY 12/10/20   Crecencio Mc, MD  Lancets (FREESTYLE) lancets Use once daily to check blood sugars  E11.9 04/29/14   Crecencio Mc, MD  losartan (COZAAR) 25 MG tablet TAKE TWO TABLETS BY MOUTH DAILY 12/16/20   Crecencio Mc, MD  meloxicam (MOBIC) 15 MG tablet Take 1 tablet (15 mg total) by mouth  daily. 01/06/21   Crecencio Mc, MD  metFORMIN (GLUCOPHAGE-XR) 750 MG 24 hr tablet TAKE ONE TABLET BY MOUTH DAILY WITH BREAKFAST 02/28/21   Crecencio Mc, MD  omeprazole (PRILOSEC) 40 MG capsule TAKE ONE CAPSULE BY MOUTH DAILY 01/10/21   Crecencio Mc, MD  Semaglutide,0.25 or 0.5MG/DOS, (OZEMPIC, 0.25 OR 0.5 MG/DOSE,) 2 MG/1.5ML SOPN Inject 0.5 mg into the skin once a week. Inject 0.25 mg weekly for 4 weeks then increase to 0.5 mg weekly 02/10/21   Crecencio Mc, MD  Syringe, Disposable, 1 ML MISC For use with B12 injections 12/23/14   Crecencio Mc, MD  temazepam (RESTORIL) 22.5 MG capsule Take 1 capsule (22.5 mg total) by mouth at bedtime as needed for sleep. 01/06/21   Crecencio Mc, MD  tiZANidine (ZANAFLEX) 2 MG tablet Take 1 tablet (2 mg total) by mouth in the morning and at bedtime. Patient taking differently: Take 2 mg by mouth as needed. 05/06/20   Crecencio Mc, MD  traZODone (DESYREL) 100 MG tablet One tablet by mouth one hour before bedtime 01/13/21   Crecencio Mc, MD  vitamin E 1000 UNIT capsule Take 1,000 Units by mouth daily.    [provider]      VITAL SIGNS:  Blood pressure (!) 166/97, pulse (!) 107, temperature 98 F (36.7 C), temperature source Oral, resp. rate 17, height 5' (1.524 m), weight 68 kg, SpO2 97 %.  PHYSICAL EXAMINATION:  Physical Exam  GENERAL:  70 y.o.-year-old Caucasian female patient lying in the bed with no acute distress.  EYES: Pupils equal, round, reactive to light and accommodation. No scleral icterus. Extraocular muscles intact.  HEENT: Head atraumatic, normocephalic. Oropharynx and nasopharynx clear.  NECK:  Supple, no jugular venous distention. No thyroid enlargement, no tenderness.  LUNGS: Normal breath sounds bilaterally, no wheezing, rales,rhonchi or crepitation. No use of accessory muscles of respiration.  CARDIOVASCULAR: Regular rate and rhythm, S1, S2 normal. No murmurs, rubs, or gallops.  ABDOMEN: Soft, nondistended,  nontender. Bowel sounds present. No organomegaly or mass.  EXTREMITIES: No pedal edema, cyanosis, or clubbing.  NEUROLOGIC: Cranial nerves II through XII are intact. Muscle strength 5/5 in all extremities. Sensation intact. Gait not checked.  PSYCHIATRIC: The patient is alert and oriented x 3.  Normal  affect and good eye contact. SKIN: No obvious rash, lesion, or ulcer.   LABORATORY PANEL:   CBC Recent Labs  Lab 03/21/21 1955  WBC 8.5  HGB 11.7*  HCT 38.2  PLT 372   ------------------------------------------------------------------------------------------------------------------  Chemistries  Recent Labs  Lab 03/21/21 1955  NA 135  K 4.0  CL 99  CO2 27  GLUCOSE 117*  BUN 13  CREATININE 0.79  CALCIUM 9.5  AST 20  ALT 23  ALKPHOS 91  BILITOT 0.6   ------------------------------------------------------------------------------------------------------------------  Cardiac Enzymes No results for input(s): TROPONINI in the last 168 hours. ------------------------------------------------------------------------------------------------------------------  RADIOLOGY:  No results found.    IMPRESSION AND PLAN:  Principal Problem:   CVA (cerebral vascular accident) ([place]) 1.  Acute CVA.  She has been having worsening memory deficits and occasional expressive dysphasia. - The patient will be admitted to an observation medical telemetry bed. - We will follow neurochecks every 4 hours for 24 hours. - Neurology consult will be obtained. - Notify Dr. Quinn Axe about patient. - PT/OT and ST consults will be obtained. - We will continue her on aspirin and Plavix. - We will continue statin therapy and check fasting lipids. - We will obtain a 2D echo with bubble study.  2.  Dyslipidemia. - We will continue statin therapy.  3.  Depression. - We will continue Wellbutrin XL.  4.  Dementia. - We will continue Aricept  5.  Bipolar disorder. - We will continue Lamictal.  6.   Essential hypertension. - We will continue Cozaar.  7.  Type 2 diabetes mellitus. - Will be placed on supplement coverage with NovoLog will continue basal coverage.  8.  GERD. - We will continue PPI therapy.  DVT prophylaxis: Lovenox.  Code Status: full code.  Family Communication:  The plan of care was discussed in details with the patient (and family). I answered all questions. The patient agreed to proceed with the above mentioned plan. Further management will depend upon hospital course. Disposition Plan: Back to previous home environment Consults called: Neurology  Status is: Observation  Dispo: The patient is from: Home              Anticipated d/c is to: Home              Patient currently is not medically stable to d/c.              Difficult to place patient: No     Christel Mormon M.D on 03/21/2021 at 9:11 PM  [facility]   From 7 PM-7 AM, contact night-coverage [email or web address]  CC: Primary care physician; Crecencio Mc, MD

## 2021-03-21 NOTE — Progress Notes (Signed)
Acute Office Visit  Subjective:    Patient ID: Katherine Jimenez, female    DOB: 1950-07-12, 70 y.o.   MRN: [id number]  Chief Complaint  Patient presents with   Follow-up    HPI Patient is in today for follow up on acute infarct on her MRI ordered by Dr. Manuella Ghazi, she was advised to go to the emergency room to be admitted for further stroke work up. She reports she did have some left leg pain prior to getting her MRI results this has resolved.  Still having memory loss that seems to have worsened. She left AMA at [facility] due to wait.  She has left leg cramp, and could not hardly walk last weke and she reports this resolved. She can walk now. 03/18/21 is the day she went to the emergency room. She did not wait at emergency room or have work up.  Seen for positive COVID test 03/03/2019, cough has improved he is not have any significant residual cough she reports she is feeling much better still has fatigue from COVID.  Denies headache.   Patient  denies any fever,chills, rash, chest pain, shortness of breath, nausea, vomiting, or diarrhea.   Denies dizziness, lightheadedness, pre syncopal or syncopal episodes.    She saw Dr. Brigitte Pulse on 02/01/21 MRI was ordered. Aricept was started. Speech therapy. Original MRI was denied by her insurance. Trazodone was offered, benadryl    Last note with Dr Manuella Ghazi for continuity of care. Reviewed.  1. Mild cognitive impairment - concerning for pseudodementia of depression in a patient with a history of depression, bipolar disorder + family stress + possible atypical Alzheimer's disease per neuropsych + positive family history of Alzheimer's Disease (mother diagnosed at age 52)   Per [place] in her fifties was diagnosed with Bipolar 1 Disorder after a manic episode. Since, mild depression and anxiety, both her parents developed dementia in their 92. Prominent impairment of visual learning complex visual sequencing/set  shifting, visuospatial perception, and constructional praxis. Clinically significant levels of anxiety, depression, and somatic preoccupation on psychological testing. Interpersonal relationships are a source angst for her as she tends to harbor a great deal of hostility, resentment, and suspiciousness towards others. No indications of suboptimal effort during testing. Suggest the possibility of atypical Alzheimer's disease. It is unclear whether her chronic microvascular ischemic changes on the brain MRI were extensive enough to be clinically significant. Recommended that further neuroimaging, will discuss at next visit. Will do a consult to resume individual psychotherapy.   MRI Brain without contrast with NeuroQuant ([place] previously ordered but denied by insurance; thus MRI Brain was ordered but not yet completed. Will re-order due to new/worsening progression of cognitive-communication impairment)  NeuroQuant is a 3D volumetric post process software that allows precise measurements of brain structures on MRI Brain without contrast. It measures and compares the volumes to standard norms, based on patient's age, gender and cranial volume. From patient's perspective it is no different than regular non contrast 3 Tesla MRI Brain. This imaging is usually scheduled in [place], [place] or [place] depending on insurance prior authorization.     Recommended driving testing.  For continuity of [place] office note:   Telephone Encounter - Ray Church, MD - 03/18/2021 10:24 AM EST Formatting of this note might be different from the original. Called and left voice message x 2. Called husband but did not discuss her health conditions as husband told me that they are separated and he  is not sure if he on her HIPPA or not. Advised patient to go to Emergency Room. My staff will try to call her again.  Electronically signed by Ray Church, MD at 03/18/2021 10:25 AM EST  Back to  top of Miscellaneous Notes Telephone Encounter - Danella Maiers, [place] - 03/18/2021 10:08 AM EST Formatting of this note might be different from the original. Patient informed of acute infarct and advised to go to the Emergency Room. Patient states she cannot go to the Emergency Room and that she just got over [place]. She began crying and request that you give her a call.  Electronically signed by Danella Maiers, [place] at 03/18/2021 10:12 AM EST Miscellaneous Notes - documented in this encounter Telephone Encounter - Isac Sarna - 03/21/2021 10:47 AM EST Formatting of this note might be different from the original. 03/18/21 patient left multiple messages with answering service.   3:44 pm: "Is at the ER, thought her dr was o/cc said she w/c/b if she hasn't been seen soon. Doesn't want to page dr o/c"  7:59 pm: "received abnormal test, went to er as advised by Dr. Manuella Ghazi, left er after waiting 7 hrs to be seen, need cb regarding test."  9:06pm: "Received abnormal test, went to er as advised by Dr. Manuella Ghazi, left er after waiting for 7 hrs to be seen, need cb regarding test."  Electronically signed by Isac Sarna at 03/21/2021 10:54 AM EST MRI : IMPRESSION: 7 mm acute infarct within the right external capsule.   Moderate/advanced chronic small-vessel ischemic changes within the cerebral white matter, slightly progressed from the brain MRI of 11/16/2018. Minimal chronic small-vessel ischemic changes are also present within the pons.   Volumetric analysis of the brain was performed, with a fully detailed report in [facility]. Briefly, the comparison with age and gender matched reference reveals a whole-brain volume normative percentile of 7. However, this degree of volume loss is not appreciated subjectively.   Paranasal sinus disease, as described. Correlate for acute sinusitis.   Small left mastoid effusion.     Electronically Signed   By: Kellie Simmering D.O.   On: 03/17/2021  18:16     Result History    Past Medical History:  Diagnosis Date   Arthritis    back, knees, fingers   Bipolar disorder ([place])    Bunion, left    Depression    Diabetes mellitus    diet controlled.    Hypertension    Lower back pain    S/P fall   Squamous cell skin cancer, face    [facility]   Treadmill stress test negative for angina pectoris June 2013    Past Surgical History:  Procedure Laterality Date   BLADDER AUGMENTATION     BLADDER SURGERY     CARDIAC CATHETERIZATION  03/05/12   no stents   CATARACT EXTRACTION W/PHACO Right 07/19/2015   Procedure: CATARACT EXTRACTION PHACO AND INTRAOCULAR LENS PLACEMENT ([place]) right eye;  Surgeon: Ronnell Freshwater, MD;  Location: [facility];  Service: Ophthalmology;  Laterality: Right;  BORDERLINE DIABETIC - oral meds   CATARACT EXTRACTION W/PHACO Left 05/28/2017   Procedure: CATARACT EXTRACTION PHACO AND INTRAOCULAR LENS PLACEMENT ([place]) LEFT DIABETIC;  Surgeon: Leandrew Koyanagi, MD;  Location: [facility];  Service: Ophthalmology;  Laterality: Left;  Diabetic - oal meds   JOINT REPLACEMENT  2012   bilateral hip   KNEE ARTHROSCOPY     TOTAL HIP ARTHROPLASTY  2012   TOTAL KNEE ARTHROPLASTY  Right 06/04/2017   Procedure: TOTAL KNEE ARTHROPLASTY;  Surgeon: Lovell Sheehan, MD;  Location: [facility];  Service: Orthopedics;  Laterality: Right;   TUBAL LIGATION     VAGINAL DELIVERY     2    Family History  Problem Relation Age of Onset   Heart disease Mother    Stroke Mother    Heart disease Father    Dementia Father    Heart disease Maternal Grandmother    Heart disease Maternal Grandfather    Heart disease Paternal Grandmother    Heart disease Paternal Grandfather    Breast cancer Maternal Aunt     Social History   Socioeconomic History   Marital status: Married    Spouse name: Not on file   Number of children: Not on file   Years of education: Not on file   Highest education level: Not on file   Occupational History   Not on file  Tobacco Use   Smoking status: Never   Smokeless tobacco: Never  Vaping Use   Vaping Use: Never used  Substance and Sexual Activity   Alcohol use: Yes    Alcohol/week: 1.0 standard drink    Types: 1 Cans of beer per week    Comment: occassionally   Drug use: No   Sexual activity: Not on file  Other Topics Concern   Not on file  Social History Narrative   Lives in [place] with husband.         Social Determinants of Health   Financial Resource Strain: Medium Risk   Difficulty of Paying Living Expenses: Somewhat hard  Food Insecurity: No Food Insecurity   Worried About Charity fundraiser in the Last Year: Never true   Ran Out of Food in the Last Year: Never true  Transportation Needs: No Transportation Needs   Lack of Transportation (Medical): No   Lack of Transportation (Non-Medical): No  Physical Activity: Sufficiently Active   Days of Exercise per Week: 5 days   Minutes of Exercise per Session: 60 min  Stress: No Stress Concern Present   Feeling of Stress : Not at all  Social Connections: Unknown   Frequency of Communication with Friends and Family: More than three times a week   Frequency of Social Gatherings with Friends and Family: More than three times a week   Attends Religious Services: Not on Electrical engineer or Organizations: Not on file   Attends Archivist Meetings: Not on file   Marital Status: Not on file  Intimate Partner Violence: Not At Risk   Fear of Current or Ex-Partner: No   Emotionally Abused: No   Physically Abused: No   Sexually Abused: No    Outpatient Medications Prior to Visit  Medication Sig Dispense Refill   acetaminophen (TYLENOL) 500 MG tablet Take 1,000 mg by mouth every 6 (six) hours as needed (FOR PAIN.).     aspirin EC 81 MG tablet Take 81 mg by mouth daily. Swallow whole.     atorvastatin (LIPITOR) 10 MG tablet Take 1 tablet (10 mg total) by mouth daily. 90 tablet 3    Biotin 5 MG CAPS Take 2 capsules by mouth 2 (two) times daily.      blood glucose meter kit and supplies Dispense Contour next bayer meter. E11.9 To check blood glucose  Once a day. 1 each 0   buPROPion (WELLBUTRIN XL) 300 MG 24 hr tablet TAKE ONE TABLET BY MOUTH DAILY 30 tablet  1   Cholecalciferol (VITAMIN D3) 1000 units CAPS Take 1,000 Units by mouth daily.      cyanocobalamin (,VITAMIN B-12,) 1000 MCG/ML injection Inject 1 mL (1,000 mcg total) into the muscle every 30 (thirty) days. 5 mL 8   diphenhydramine-acetaminophen (TYLENOL PM) 25-500 MG TABS tablet Take 1 tablet by mouth at bedtime as needed.     donepezil (ARICEPT) 5 MG tablet Take 5 mg by mouth daily.     DULoxetine (CYMBALTA) 30 MG capsule TAKE ONE CAPSULE BY MOUTH DAILY 90 capsule 0   glucose blood (FREESTYLE LITE) test strip Use once daily  as instructed to test blood sugar E 11.9 100 each 3   lamoTRIgine (LAMICTAL) 200 MG tablet TAKE ONE TABLET BY MOUTH TWICE A DAY 180 tablet 1   Lancets (FREESTYLE) lancets Use once daily to check blood sugars  E11.9 100 each 3   losartan (COZAAR) 25 MG tablet TAKE TWO TABLETS BY MOUTH DAILY 180 tablet 1   meloxicam (MOBIC) 15 MG tablet Take 1 tablet (15 mg total) by mouth daily. 30 tablet 0   metFORMIN (GLUCOPHAGE-XR) 750 MG 24 hr tablet TAKE ONE TABLET BY MOUTH DAILY WITH BREAKFAST 90 tablet 1   omeprazole (PRILOSEC) 40 MG capsule TAKE ONE CAPSULE BY MOUTH DAILY 90 capsule 3   Semaglutide,0.25 or 0.5MG/DOS, (OZEMPIC, 0.25 OR 0.5 MG/DOSE,) 2 MG/1.5ML SOPN Inject 0.5 mg into the skin once a week. Inject 0.25 mg weekly for 4 weeks then increase to 0.5 mg weekly 1.5 mL 0   Syringe, Disposable, 1 ML MISC For use with B12 injections 25 each 3   temazepam (RESTORIL) 22.5 MG capsule Take 1 capsule (22.5 mg total) by mouth at bedtime as needed for sleep. 30 capsule 0   tiZANidine (ZANAFLEX) 2 MG tablet Take 1 tablet (2 mg total) by mouth in the morning and at bedtime. (Patient taking differently: Take 2  mg by mouth as needed.) 30 tablet 1   traZODone (DESYREL) 100 MG tablet One tablet by mouth one hour before bedtime 90 tablet 0   vitamin E 1000 UNIT capsule Take 1,000 Units by mouth daily.     No facility-administered medications prior to visit.    Allergies  Allergen Reactions   Thimerosal Itching    Review of Systems  Constitutional:  Positive for fatigue. Negative for activity change, appetite change, chills, diaphoresis, fever and unexpected weight change.  HENT: Negative.    Eyes: Negative.   Respiratory:  Positive for cough. Negative for apnea, choking, chest tightness, shortness of breath, wheezing and stridor.   Cardiovascular: Negative.   Gastrointestinal: Negative.   Genitourinary: Negative.   Neurological: Negative.   Hematological: Negative.   Psychiatric/Behavioral:  Positive for decreased concentration. Negative for self-injury, sleep disturbance and suicidal ideas. The patient is nervous/anxious.       Objective:    Physical Exam Vitals reviewed.  Constitutional:      General: She is not in acute distress.    Appearance: Normal appearance. She is well-developed. She is not ill-appearing, toxic-appearing or diaphoretic.     Interventions: She is not intubated. HENT:     Head: Normocephalic and atraumatic.     Right Ear: External ear normal.     Left Ear: External ear normal.     Nose: Nose normal.     Mouth/Throat:     Mouth: Mucous membranes are moist.     Pharynx: No oropharyngeal exudate.  Eyes:     General: Lids are normal. No scleral icterus.  Right eye: No discharge.        Left eye: No discharge.     Conjunctiva/sclera: Conjunctivae normal.     Right eye: Right conjunctiva is not injected. No exudate or hemorrhage.    Left eye: Left conjunctiva is not injected. No exudate or hemorrhage.    Pupils: Pupils are equal, round, and reactive to light.  Neck:     Thyroid: No thyroid mass or thyromegaly.     Vascular: Normal carotid pulses. No  carotid bruit, hepatojugular reflux or JVD.     Trachea: Trachea and phonation normal. No tracheal tenderness or tracheal deviation.     Meningeal: Brudzinski's sign and Kernig's sign absent.  Cardiovascular:     Rate and Rhythm: Regular rhythm. Tachycardia present.     Pulses: Normal pulses.          Radial pulses are 2+ on the right side and 2+ on the left side.       Dorsalis pedis pulses are 2+ on the right side and 2+ on the left side.       Posterior tibial pulses are 2+ on the right side and 2+ on the left side.     Heart sounds: Normal heart sounds, S1 normal and S2 normal. Heart sounds not distant. No murmur heard.   No friction rub. No gallop.  Pulmonary:     Effort: Pulmonary effort is normal. No tachypnea, bradypnea, accessory muscle usage or respiratory distress. She is not intubated.     Breath sounds: Normal breath sounds. No stridor. No wheezing or rales.  Chest:     Chest wall: No tenderness.  Abdominal:     General: Bowel sounds are normal. There is no distension or abdominal bruit.     Palpations: Abdomen is soft. There is no shifting dullness, fluid wave, hepatomegaly, splenomegaly, mass or pulsatile mass.     Tenderness: There is no abdominal tenderness. There is no guarding or rebound.     Hernia: No hernia is present.  Musculoskeletal:        General: No tenderness or deformity. Normal range of motion.     Cervical back: Full passive range of motion without pain, normal range of motion and neck supple. No edema, erythema or rigidity. No spinous process tenderness or muscular tenderness. Normal range of motion.  Lymphadenopathy:     Head:     Right side of head: No submental, submandibular, tonsillar, preauricular, posterior auricular or occipital adenopathy.     Left side of head: No submental, submandibular, tonsillar, preauricular, posterior auricular or occipital adenopathy.     Cervical: No cervical adenopathy.     Right cervical: No superficial, deep or  posterior cervical adenopathy.    Left cervical: No superficial, deep or posterior cervical adenopathy.     Upper Body:     Right upper body: No supraclavicular or pectoral adenopathy.     Left upper body: No supraclavicular or pectoral adenopathy.  Skin:    General: Skin is warm and dry.     Coloration: Skin is not pale.     Findings: No abrasion, bruising, burn, ecchymosis, erythema, lesion, petechiae or rash.     Nails: There is no clubbing.  Neurological:     Mental Status: She is alert and oriented to person, place, and time.     GCS: GCS eye subscore is 4. GCS verbal subscore is 5. GCS motor subscore is 6.     Cranial Nerves: No cranial nerve deficit.  Sensory: No sensory deficit.     Motor: Weakness (mild left leg) present. No tremor, atrophy, abnormal muscle tone or seizure activity.     Coordination: Coordination normal.     Gait: Gait normal.     Deep Tendon Reflexes: Reflexes are normal and symmetric. Reflexes normal.     Reflex Scores:      Tricep reflexes are 2+ on the right side and 2+ on the left side.      Bicep reflexes are 2+ on the right side and 2+ on the left side.      Brachioradialis reflexes are 2+ on the right side and 2+ on the left side.      Patellar reflexes are 2+ on the right side and 2+ on the left side.      Achilles reflexes are 2+ on the right side and 2+ on the left side.    Comments: Does have some memory recall issues in room   Psychiatric:        Speech: Speech normal.        Behavior: Behavior normal.        Thought Content: Thought content normal.        Judgment: Judgment normal.    BP 134/80    Pulse (!) 101    Ht 5' (1.524 m)    Wt 148 lb 9.6 oz (67.4 kg)    SpO2 94%    BMI 29.02 kg/m  Wt Readings from Last 3 Encounters:  03/21/21 148 lb 9.6 oz (67.4 kg)  03/02/21 151 lb (68.5 kg)  01/06/21 160 lb (72.6 kg)    Health Maintenance Due  Topic Date Due   Zoster Vaccines- Shingrix (2 of 2) 01/15/2019   Pneumonia Vaccine 65+ Years  old (3 - PPSV23 if available, else PCV20) 04/21/2020   COVID-19 Vaccine (4 - Booster for Pfizer series) 05/12/2020   MAMMOGRAM  01/04/2021    There are no preventive care reminders to display for this patient.   Lab Results  Component Value Date   TSH 0.93 04/16/2020   Lab Results  Component Value Date   WBC 7.0 03/18/2021   HGB 12.0 03/18/2021   HCT 39.3 03/18/2021   MCV 82.9 03/18/2021   PLT 402 (H) 03/18/2021   Lab Results  Component Value Date   NA 138 03/18/2021   K 4.5 03/18/2021   CO2 29 03/18/2021   GLUCOSE 112 (H) 03/18/2021   BUN 17 03/18/2021   CREATININE 0.67 03/18/2021   BILITOT 0.6 03/18/2021   ALKPHOS 87 03/18/2021   AST 23 03/18/2021   ALT 24 03/18/2021   PROT 7.9 03/18/2021   ALBUMIN 4.4 03/18/2021   CALCIUM 9.8 03/18/2021   ANIONGAP 6 03/18/2021   GFR 76.52 03/02/2021   Lab Results  Component Value Date   CHOL 199 01/06/2021   Lab Results  Component Value Date   HDL 107.50 01/06/2021   Lab Results  Component Value Date   LDLCALC 72 01/06/2021   Lab Results  Component Value Date   TRIG 100.0 01/06/2021   Lab Results  Component Value Date   CHOLHDL 2 01/06/2021   Lab Results  Component Value Date   HGBA1C 6.6 (H) 01/06/2021       Assessment & Plan:   Problem List Items Addressed This Visit       Cardiovascular and Mediastinum   Cerebrovascular accident (CVA) ([place]) - Primary     Other   Abnormal MRI of head   Memory loss  History of COVID-19   Patient is advised she will need to go to the emergency room NOW again for evaluation of acute infarct on MRI. She is not to self drive, she is in no acute distress her son is driving her. Advised her she needs to have further work up not limited to carotid artery, DVT US, angiogram. She is on baby aspirin since Friday.  Needs acute work up that can not be performed in office.  Will need follow up with Dr Manuella Ghazi after hospital and  Patient verbalized understanding of all instruc  patient is son coming to the office to pick her up to take her to the emergency room she is advised to follow-up with Dr. Brigitte Pulse and a follow-up with her primary care for routine health maintenance and follow-up in this incident after being seen in the emergency room. Patient verbalized understanding of all instructions given and denies any further questions at this time.  Declined 911.  Red Flags discussed. The patient was given clear instructions to go to ER or return to medical center if any red flags develop, symptoms do not improve, worsen or new problems develop. They verbalized understanding.   Return in about 1 week (around 03/28/2021), or ER now for strike evaluation work up follow up with PCP and with Dr. Manuella Ghazi after.   No orders of the defined types were placed in this encounter.    Marcille Buffy, FNP

## 2021-03-21 NOTE — Patient Instructions (Signed)
Ischemic Stroke An ischemic stroke happens when part of the brain does not get enough blood. This can cause a lifelong change in how the brain works. An ischemic stroke is an emergency. It must be treated right away. What are the causes? This condition is caused by lower blood flow to part of the brain. This may be due to: A small clump, or clot, of blood. A buildup of fatty substance (plaque) in the blood vessels. An abnormal heart rhythm. A blocked or damaged artery in the head or neck. Infection. Swelling of the arteries in the brain. Sometimes, the cause is not known. What increases the risk? Certain medical conditions, such as: High blood pressure (hypertension). Heart disease. Diabetes. High cholesterol. Being very overweight. Sleep problems (sleep apnea). Other risk factors that you can change include: Smoking. Not being active. Heavy alcohol and drug use. Taking birth control pills, especially if you smoke. Risk factors that you cannot change include: Being older than age 71. Having had blood clots, stroke, or mini-stroke (transient ischemic attack, TIA) before. Having a family history of stroke. What are the signs or symptoms? Symptoms of a stroke usually happen all of a sudden. They can include: Weakness or a loss of feeling in your face, arm, or leg, often on one side of the body. Loss of balance or coordination. Slurred speech. Trouble talking or understanding what people say. Seeing things blurry or seeing double of one thing. Feeling dizzy or confused. Feeling like you may vomit (nausea) or vomiting. A very bad headache. If you can, write down the exact time you first had symptoms. Tell your doctor. If symptoms come and go, they could be caused by a mini-stroke. Get help right away, even if you feel better. How is this treated? You must get treatment as soon as you have stroke symptoms. Some treatments work better if they are done within 3-6 hours of your first  symptoms. You may get medicines that do these things: Take out or break up the blood clot. Control blood pressure. Thin your blood. Other treatments may include: Getting oxygen. Getting fluids through an IV tube. Procedures that make blood flow better. After a stroke, you may work with therapists to help you get better. Follow these instructions at home: Medicines Take over-the-counter and prescription medicines only as told by your doctor. If you were told to take a medicine to thin your blood, take it exactly as told. Take it at the same time each day. Taking too much of this medicine can cause bleeding. If you do not take enough medicine, it may not work as well. If your doctor did not prescribe medicines that have aspirin or NSAIDs, such as ibuprofen, talk to your doctor before you take any of these. If you are taking a blood thinner: Put pressure over any cuts that bleed for longer than normal. Tell your dentist and other doctors that you take this medicine. Avoid activities that could hurt or bruise you. Wear a medical alert bracelet or carry a card that shows you are taking blood thinners. Eating and drinking Follow instructions from your doctor about diet. Eat healthy foods. If you have trouble swallowing: Take small bites when eating. Eat foods that are soft or pureed. Safety Follow instructions from your care team about physical activity. Use a walker or cane as told by your doctor. Keep your home safe so you do not fall. Take these steps: Put grab bars in the bedroom and bathroom. Use raised toilets and put  a seat in the shower. Get rid of clutter and things you could trip on, such as cords or rugs. General instructions Do not smoke or use any products that contain nicotine or tobacco. If you need help quitting, ask your doctor. If you drink alcohol: Limit how much you have to: 0-1 drink a day for women who are not pregnant. 0-2 drinks a day for men. Know how much  alcohol is in your drink. In the [place] one drink equals one 12 oz bottle of beer (355 mL), one 5 oz glass of wine (148 mL), or one 1 oz glass of hard liquor (44 mL). Stay active. Keep all follow-up visits. How is this prevented? You can lower your risk of another stroke by managing these conditions: High blood pressure. High cholesterol. Diabetes. Heart disease. Sleep problems. Being overweight. You can also lower your risk if you: Quit smoking. Limit alcohol. Stay active. Your doctor will continue to help you with ways to prevent problems caused by a stroke. Get help right away if: You have any signs of a stroke. "BE FAST" is an easy way to remember the main warning signs: B - Balance. Dizziness, sudden trouble walking, or loss of balance. E - Eyes. Trouble seeing or a change in how you see. F - Face. Sudden weakness or loss of feeling of the face. The face or eyelid may droop on one side. A - Arms. Weakness or loss of feeling in an arm. This happens all of a sudden and most often on one side of the body. S - Speech. Sudden trouble speaking, slurred speech, or trouble understanding what people say. T - Time. Time to call emergency services. Write down what time symptoms started. You have other signs of a stroke, such as: A sudden, very bad headache with no known cause. Feeling like you may vomit. Vomiting. A seizure. These symptoms may be an emergency. Get help right away. Call your local emergency services (911 in the [place] Do not wait to see if the symptoms will go away. Do not drive yourself to the hospital. Summary An ischemic stroke happens when the brain does not get enough blood. Symptoms of a stroke often happen all of a sudden. You must get treatment as soon as you have stroke symptoms. Stroke is an emergency. It must be treated right away. This information is not intended to replace advice given to you by your health care provider. Make sure you discuss any questions  you have with your health care provider. Document Revised: 10/29/2019 Document Reviewed: 10/29/2019 Elsevier Patient Education  2022 [employer]. Stroke Prevention Some medical conditions and lifestyle choices can lead to a higher risk for a stroke. You can help to prevent a stroke by eating healthy foods and exercising. It also helps to not smoke and to manage any health problems you may have. How can this condition affect me? A stroke is an emergency. It should be treated right away. A stroke can lead to brain damage or threaten your life. There is a better chance of surviving and getting better after a stroke if you get medical help right away. What can increase my risk? The following medical conditions may increase your risk of a stroke: Diseases of the heart and blood vessels (cardiovascular disease). High blood pressure (hypertension). Diabetes. High cholesterol. Sickle cell disease. Problems with blood clotting. Being very overweight. Sleeping problems (obstructivesleep apnea). Other risk factors include: Being older than age 72. A history of blood clots, stroke, or  mini-stroke (TIA). Race, ethnic background, or a family history of stroke. Smoking or using tobacco products. Taking birth control pills, especially if you smoke. Heavy alcohol and drug use. Not being active. What actions can I take to prevent this? Manage your health conditions High cholesterol. Eat a healthy diet. If this is not enough to manage your cholesterol, you may need to take medicines. Take medicines as told by your doctor. High blood pressure. Try to keep your blood pressure below 130/80. If your blood pressure cannot be managed through a healthy diet and regular exercise, you may need to take medicines. Take medicines as told by your doctor. Ask your doctor if you should check your blood pressure at home. Have your blood pressure checked every year. Diabetes. Eat a healthy diet and get regular  exercise. If your blood sugar (glucose) cannot be managed through diet and exercise, you may need to take medicines. Take medicines as told by your doctor. Talk to your doctor about getting checked for sleeping problems. Signs of a problem can include: Snoring a lot. Feeling very tired. Make sure that you manage any other conditions you have. Nutrition  Follow instructions from your doctor about what to eat or drink. You may be told to: Eat and drink fewer calories each day. Limit how much salt (sodium) you use to 1,500 milligrams (mg) each day. Use only healthy fats for cooking, such as olive oil, canola oil, and sunflower oil. Eat healthy foods. To do this: Choose foods that are high in fiber. These include whole grains, and fresh fruits and vegetables. Eat at least 5 servings of fruits and vegetables a day. Try to fill one-half of your plate with fruits and vegetables at each meal. Choose low-fat (lean) proteins. These include low-fat cuts of meat, chicken without skin, fish, tofu, beans, and nuts. Eat low-fat dairy products. Avoid foods that: Are high in salt. Have saturated fat. Have trans fat. Have cholesterol. Are processed or pre-made. Count how many carbohydrates you eat and drink each day. Lifestyle If you drink alcohol: Limit how much you have to: 0-1 drink a day for women who are not pregnant. 0-2 drinks a day for men. Know how much alcohol is in your drink. In the [place] one drink equals one 12 oz bottle of beer (336mL), one 5 oz glass of wine (171mL), or one 1 oz glass of hard liquor (30mL). Do not smoke or use any products that have nicotine or tobacco. If you need help quitting, ask your doctor. Avoid secondhand smoke. Do not use drugs. Activity  Try to stay at a healthy weight. Get at least 30 minutes of exercise on most days, such as: Fast walking. Biking. Swimming. Medicines Take over-the-counter and prescription medicines only as told by your  doctor. Avoid taking birth control pills. Talk to your doctor about the risks of taking birth control pills if: You are over 75 years old. You smoke. You get very bad headaches. You have had a blood clot. Where to find more information American Stroke Association: www.strokeassociation.org Get help right away if: You or a loved one has any signs of a stroke. "BE FAST" is an easy way to remember the warning signs: B - Balance. Dizziness, sudden trouble walking, or loss of balance. E - Eyes. Trouble seeing or a change in how you see. F - Face. Sudden weakness or loss of feeling of the face. The face or eyelid may droop on one side. A - Arms. Weakness or loss of  feeling in an arm. This happens all of a sudden and most often on one side of the body. S - Speech. Sudden trouble speaking, slurred speech, or trouble understanding what people say. T - Time. Time to call emergency services. Write down what time symptoms started. You or a loved one has other signs of a stroke, such as: A sudden, very bad headache with no known cause. Feeling like you may vomit (nausea). Vomiting. A seizure. These symptoms may be an emergency. Get help right away. Call your local emergency services (911 in the [place] Do not wait to see if the symptoms will go away. Do not drive yourself to the hospital. Summary You can help to prevent a stroke by eating healthy, exercising, and not smoking. It also helps to manage any health problems you have. Do not smoke or use any products that contain nicotine or tobacco. Get help right away if you or a loved one has any signs of a stroke. This information is not intended to replace advice given to you by your health care provider. Make sure you discuss any questions you have with your health care provider. Document Revised: 10/20/2019 Document Reviewed: 10/20/2019 Elsevier Patient Education  [place].

## 2021-03-21 NOTE — ED Triage Notes (Signed)
Pt to ED From Dr Lupita Dawn office. States had MRI last Thursday that showed stroke. Came to ED on 12/16 for same. Denies deficits, difficulty talking, walking.  Reports got MRI for "memory problems"  Pt states "I am only waiting 2 hours then I am leaving, I have checked other places, this is my brain we are dealing with"  Pt declines additional lab work, CT scan

## 2021-03-21 NOTE — ED Notes (Addendum)
Report given to [facility] rn cpod nurse.  Pt moved to cpod 38

## 2021-03-21 NOTE — ED Provider Notes (Signed)
[facility] Emergency Department Provider Note  ____________________________________________   Event Date/Time   First MD Initiated Contact with Patient 03/21/21 1846     (approximate)  I have reviewed the triage vital signs and the nursing notes.   HISTORY  Chief Complaint stroke work up   HPI Katherine Jimenez is a 70 y.o. female with a past medical history of some chronic low back pain, HTN, DM, bipolar disorder, arthritis, and some chronic memory issues going back to most 2 years per patient who presents to the emergency room after she was told she had a stroke on MRI obtained on 12/15 ordered by her neurologist for further evaluation with concern for possible dementia.  Patient denies any recent or new focal extremity weakness numbness tingling vertigo vision changes or other clear focal neurological deficits although does feel that her last couple weeks her memory has gotten worse.  She notes she was diagnosed with COVID 2 or 3 weeks ago and initially had very significant shortness of breath and cough as well as congestion but feels the symptoms is vastly improved and now only noticing mild shortness of breath with exertion.  Her cough is also much better but not completely gone.  He denies any new shortness of breath, chest pain, abdominal pain, nausea, vomiting, diarrhea, burning with urination, rash or myalgias at this time.  She states she had a cramp in the front of her left lower leg yesterday but this is since resolved.  No recent falls or injuries or history of CVA.  She is only on a baby ASA but no other anticoagulation or blood thinners.  No other acute concerns at this time.         Past Medical History:  Diagnosis Date   Arthritis    back, knees, fingers   Bipolar disorder ([place])    Bunion, left    Depression    Diabetes mellitus    diet controlled.    Hypertension    Lower back pain    S/P fall   Squamous cell skin cancer, face    [facility]  Derm   Treadmill stress test negative for angina pectoris June 2013    Patient Active Problem List   Diagnosis Date Noted   Cerebrovascular accident (CVA) ([place]) 03/21/2021   Abnormal MRI of head 03/21/2021   Memory loss 03/21/2021   History of COVID-19 03/21/2021   CVA (cerebral vascular accident) ([place]) 03/21/2021   Acute cough 03/02/2021   Lab test positive for detection of COVID-19 virus 03/02/2021   Shortness of breath 03/02/2021   History of chest pain 03/02/2021   Polyarthralgia 01/08/2021   Hyperlipidemia associated with type 2 diabetes mellitus ([place]) 07/28/2020   Clavicular enlargement 08/03/2019   Cognitive changes 10/16/2018   Osteoarthritis of right knee 06/04/2017   Idiopathic scoliosis 12/06/2015   Chest pain 08/29/2015   Insomnia secondary to depression with anxiety 05/29/2015   Palpitations 03/02/2015   Fibromyalgia 01/30/2015   B12 deficiency 12/23/2014   Cystocele 08/24/2013   Preoperative evaluation to rule out surgical contraindication 01/21/2013   Overweight (BMI 25.0-29.9) 01/21/2013   Degenerative lumbar spinal stenosis 09/24/2011   Hypertension 08/31/2011   Bipolar disorder ([place]) 08/31/2011   Iron deficiency anemia 08/31/2011    Past Surgical History:  Procedure Laterality Date   BLADDER AUGMENTATION     BLADDER SURGERY     CARDIAC CATHETERIZATION  03/05/12   no stents   CATARACT EXTRACTION W/PHACO Right 07/19/2015   Procedure: CATARACT EXTRACTION  PHACO AND INTRAOCULAR LENS PLACEMENT (IOC) right eye;  Surgeon: Ronnell Freshwater, MD;  Location: [place];  Service: Ophthalmology;  Laterality: Right;  BORDERLINE DIABETIC - oral meds   CATARACT EXTRACTION W/PHACO Left 05/28/2017   Procedure: CATARACT EXTRACTION PHACO AND INTRAOCULAR LENS PLACEMENT ([place]) LEFT DIABETIC;  Surgeon: Leandrew Koyanagi, MD;  Location: [place];  Service: Ophthalmology;  Laterality: Left;  Diabetic - oal meds   JOINT REPLACEMENT  2012   bilateral  hip   KNEE ARTHROSCOPY     TOTAL HIP ARTHROPLASTY  2012   TOTAL KNEE ARTHROPLASTY Right 06/04/2017   Procedure: TOTAL KNEE ARTHROPLASTY;  Surgeon: Lovell Sheehan, MD;  Location: [facility];  Service: Orthopedics;  Laterality: Right;   TUBAL LIGATION     VAGINAL DELIVERY     2    Prior to Admission medications   Medication Sig Start Date End Date Taking? Authorizing Provider  acetaminophen (TYLENOL) 500 MG tablet Take 1,000 mg by mouth every 6 (six) hours as needed (FOR PAIN.).   Yes [provider]  aspirin EC 81 MG tablet Take 81 mg by mouth daily. Swallow whole.   Yes [provider]  atorvastatin (LIPITOR) 10 MG tablet Take 1 tablet (10 mg total) by mouth daily. 04/23/20  Yes Crecencio Mc, MD  Biotin 5 MG CAPS Take 2 capsules by mouth 2 (two) times daily.    Yes [provider]  blood glucose meter kit and supplies Dispense Contour next bayer meter. E11.9 To check blood glucose  Once a day. 08/31/15  Yes Crecencio Mc, MD  buPROPion (WELLBUTRIN XL) 300 MG 24 hr tablet TAKE ONE TABLET BY MOUTH DAILY 02/27/21  Yes Crecencio Mc, MD  Cholecalciferol (VITAMIN D3) 1000 units CAPS Take 1,000 Units by mouth daily.    Yes [provider]  cyanocobalamin (,VITAMIN B-12,) 1000 MCG/ML injection Inject 1 mL (1,000 mcg total) into the muscle every 30 (thirty) days. 04/23/20  Yes Crecencio Mc, MD  diphenhydramine-acetaminophen (TYLENOL PM) 25-500 MG TABS tablet Take 1 tablet by mouth at bedtime as needed.   Yes [provider]  donepezil (ARICEPT) 5 MG tablet Take 5 mg by mouth daily. 02/01/21  Yes [provider]  DULoxetine (CYMBALTA) 30 MG capsule TAKE ONE CAPSULE BY MOUTH DAILY 03/17/21  Yes Crecencio Mc, MD  glucose blood (FREESTYLE LITE) test strip Use once daily  as instructed to test blood sugar E 11.9 04/29/14  Yes Crecencio Mc, MD  lamoTRIgine (LAMICTAL) 200 MG tablet TAKE ONE TABLET BY MOUTH TWICE A DAY 12/10/20  Yes Crecencio Mc,  MD  Lancets (FREESTYLE) lancets Use once daily to check blood sugars  E11.9 04/29/14  Yes Crecencio Mc, MD  losartan (COZAAR) 25 MG tablet TAKE TWO TABLETS BY MOUTH DAILY 12/16/20  Yes Crecencio Mc, MD  meloxicam (MOBIC) 15 MG tablet Take 1 tablet (15 mg total) by mouth daily. 01/06/21  Yes Crecencio Mc, MD  metFORMIN (GLUCOPHAGE-XR) 750 MG 24 hr tablet TAKE ONE TABLET BY MOUTH DAILY WITH BREAKFAST 02/28/21  Yes Crecencio Mc, MD  omeprazole (PRILOSEC) 40 MG capsule TAKE ONE CAPSULE BY MOUTH DAILY 01/10/21  Yes Crecencio Mc, MD  Semaglutide,0.25 or 0.5MG/DOS, (OZEMPIC, 0.25 OR 0.5 MG/DOSE,) 2 MG/1.5ML SOPN Inject 0.5 mg into the skin once a week. Inject 0.25 mg weekly for 4 weeks then increase to 0.5 mg weekly 02/10/21  Yes Crecencio Mc, MD  Syringe, Disposable, 1 ML  MISC For use with B12 injections 12/23/14  Yes Crecencio Mc, MD  temazepam (RESTORIL) 22.5 MG capsule Take 1 capsule (22.5 mg total) by mouth at bedtime as needed for sleep. 01/06/21  Yes Crecencio Mc, MD  tiZANidine (ZANAFLEX) 2 MG tablet Take 1 tablet (2 mg total) by mouth in the morning and at bedtime. Patient taking differently: Take 2 mg by mouth as needed. 05/06/20  Yes Crecencio Mc, MD  traZODone (DESYREL) 100 MG tablet One tablet by mouth one hour before bedtime 01/13/21  Yes Crecencio Mc, MD  vitamin E 1000 UNIT capsule Take 1,000 Units by mouth daily.   Yes [provider]    Allergies Thimerosal  Family History  Problem Relation Age of Onset   Heart disease Mother    Stroke Mother    Heart disease Father    Dementia Father    Heart disease Maternal Grandmother    Heart disease Maternal Grandfather    Heart disease Paternal Grandmother    Heart disease Paternal Grandfather    Breast cancer Maternal Aunt     Social History Social History   Tobacco Use   Smoking status: Never   Smokeless tobacco: Never  Vaping Use   Vaping Use: Never used  Substance Use Topics   Alcohol use:  Yes    Alcohol/week: 1.0 standard drink    Types: 1 Cans of beer per week    Comment: occassionally   Drug use: No    Review of Systems  Review of Systems  Constitutional:  Negative for chills and fever.  HENT:  Positive for congestion (improving). Negative for sore throat.   Eyes:  Negative for pain.  Respiratory:  Positive for cough and shortness of breath (w/ exertion, improving). Negative for stridor.   Cardiovascular:  Negative for chest pain.  Gastrointestinal:  Negative for vomiting.  Musculoskeletal:  Positive for myalgias (yesterday L anterior leg now resolved).  Skin:  Negative for rash.  Neurological:  Negative for seizures, loss of consciousness and headaches.  Psychiatric/Behavioral:  Positive for memory loss. Negative for suicidal ideas.   All other systems reviewed and are negative.    ____________________________________________   PHYSICAL EXAM:  VITAL SIGNS: ED Triage Vitals  Enc Vitals Group     BP 03/21/21 1725 (!) 166/97     Pulse Rate 03/21/21 1725 (!) 107     Resp 03/21/21 1725 17     Temp 03/21/21 1725 98 F (36.7 C)     Temp Source 03/21/21 1725 Oral     SpO2 03/21/21 1725 97 %     Weight 03/21/21 1727 149 lb 14.6 oz (68 kg)     Height 03/21/21 1727 5' (1.524 m)     Head Circumference --      Peak Flow --      Pain Score 03/21/21 1727 0     Pain Loc --      Pain Edu? --      Excl. in [place]? --    Vitals:   03/21/21 1725  BP: (!) 166/97  Pulse: (!) 107  Resp: 17  Temp: 98 F (36.7 C)  SpO2: 97%   Physical Exam Vitals and nursing note reviewed.  Constitutional:      General: She is not in acute distress.    Appearance: She is well-developed.  HENT:     Head: Normocephalic and atraumatic.     Right Ear: External ear normal.     Left Ear: External ear  normal.     Nose: Nose normal.  Eyes:     Conjunctiva/sclera: Conjunctivae normal.  Cardiovascular:     Rate and Rhythm: Normal rate and regular rhythm.     Heart sounds: No murmur  heard. Pulmonary:     Effort: Pulmonary effort is normal. No respiratory distress.     Breath sounds: Normal breath sounds.  Abdominal:     Palpations: Abdomen is soft.     Tenderness: There is no abdominal tenderness.  Musculoskeletal:        General: No swelling.     Cervical back: Neck supple.  Skin:    General: Skin is warm and dry.     Capillary Refill: Capillary refill takes less than 2 seconds.  Neurological:     Mental Status: She is alert.  Psychiatric:        Mood and Affect: Mood normal.    Cranial nerves II through XII grossly intact.  No pronator drift.  No finger dysmetria.  Symmetric 5/5 strength of all extremities.  Sensation intact to light touch in all extremities.  Unremarkable unassisted gait.  ____________________________________________   LABS (all labs ordered are listed, but only abnormal results are displayed)  Labs Reviewed  CBC - Abnormal; Notable for the following components:      Result Value   Hemoglobin 11.7 (*)    MCH 25.9 (*)    RDW 17.0 (*)    All other components within normal limits  COMPREHENSIVE METABOLIC PANEL - Abnormal; Notable for the following components:   Glucose, Bld 117 (*)    All other components within normal limits  URINE DRUG SCREEN, QUALITATIVE ([facility] ONLY) - Abnormal; Notable for the following components:   Benzodiazepine, Ur Scrn POSITIVE (*)    All other components within normal limits  URINALYSIS, ROUTINE W REFLEX MICROSCOPIC - Abnormal; Notable for the following components:   Color, Urine YELLOW (*)    APPearance CLOUDY (*)    Leukocytes,Ua LARGE (*)    WBC, UA >50 (*)    Bacteria, UA RARE (*)    Non Squamous Epithelial PRESENT (*)    All other components within normal limits  LIPID PANEL - Abnormal; Notable for the following components:   Cholesterol 250 (*)    LDL Cholesterol 111 (*)    All other components within normal limits  URINE CULTURE  ETHANOL  PROTIME-INR  APTT  DIFFERENTIAL  HEMOGLOBIN A1C   HEMOGLOBIN A1C  LIPID PANEL   ____________________________________________  EKG  ECG remarkable sinus rhythm with a ventricular rate of 93, normal axis, unremarkable intervals without evidence of acute ischemia or significant arrhythmia. ____________________________________________  RADIOLOGY  ED MD interpretation: CTA head and neck shows previously identified subcentimeter infarct involving the right external capsule but no evidence of hemorrhage, other ischemia or other clear acute intracranial process.  There is no evidence of significant large vessel occlusion or stenosis but mild atherosclerotic changes at the carotid bifurcations.  There is underlying atrophy and moderate chronic microvascular ischemic disease noted.  Official radiology report(s): CT ANGIO HEAD NECK W WO CM  Result Date: 03/21/2021 CLINICAL DATA:  Initial evaluation for neuro deficit, stroke suspected. EXAM: CT ANGIOGRAPHY HEAD AND NECK TECHNIQUE: Multidetector CT imaging of the head and neck was performed using the standard protocol during bolus administration of intravenous contrast. Multiplanar CT image reconstructions and MIPs were obtained to evaluate the vascular anatomy. Carotid stenosis measurements (when applicable) are obtained utilizing NASCET criteria, using the distal internal carotid diameter as the denominator. CONTRAST:  38m OMNIPAQUE IOHEXOL 350 MG/ML SOLN COMPARISON:  MRI from 03/17/2021. FINDINGS: CT HEAD FINDINGS Brain: Previously identified subcentimeter ischemic infarct involving the right external capsule not well visualized by CT. No other acute large vessel territory infarct. No acute intracranial hemorrhage. Age-related cerebral atrophy with moderately advanced chronic microvascular ischemic disease. No mass lesion or midline shift. No hydrocephalus or extra-axial fluid collection. Vascular: No hyperdense vessel. Skull: Scalp soft tissues and calvarium within normal limits. Sinuses: Air-fluid  level noted within the right sphenoid sinus. Paranasal sinuses are otherwise clear. Trace left mastoid effusion noted, of doubtful significance. Orbits: Globes and orbital soft tissues demonstrate no acute finding. Review of the MIP images confirms the above findings CTA NECK FINDINGS Aortic arch: Visualized aortic arch normal caliber with normal branch pattern. No stenosis about the origin of the great vessels. Right carotid system: Right common and internal carotid arteries patent without stenosis, dissection or occlusion. Mild for age atheromatous change about the right carotid bulb without stenosis. Left carotid system: Left common and internal carotid arteries patent without stenosis, dissection or occlusion. Mild for age atheromatous change about the left carotid bulb without stenosis. Vertebral arteries: Both vertebral arteries arise from subclavian arteries. No proximal subclavian artery stenosis. Left vertebral artery dominant. Vertebral arteries patent without stenosis, dissection or occlusion. Skeleton: No discrete or worrisome osseous lesions. Moderate spondylosis noted at C5-6 with resultant mild spinal stenosis. Other neck: No other acute soft tissue abnormality within the neck. Few scattered subcentimeter nodules noted within the thyroid, largest of which measures 5 mm on the left. These are of doubtful significance given size and patient age, with no follow-up imaging recommended (ref: J Am Coll Radiol. 2015 Feb;12(2): 143-50). Upper chest: Visualized upper chest demonstrates no acute finding. Review of the MIP images confirms the above findings CTA HEAD FINDINGS Anterior circulation: Both internal carotid arteries widely patent to the termini without stenosis. A1 segments widely patent. Normal anterior communicating artery complex. Anterior cerebral arteries widely patent proximally. Focal calcified plaque noted at the junction of the right A2/A3 segment. No M1 stenosis or occlusion. Normal MCA  bifurcations. Distal MCA branches perfused and symmetric. Posterior circulation: Mild atheromatous plaque within the V4 segments bilaterally with associated short-segment mild stenoses. Left vertebral artery dominant. Both PICA origins patent and normal. Basilar patent to its distal aspect without stenosis. Superior cerebellar arteries patent bilaterally. Both PCAs primarily supplied via the basilar well perfused or distal aspects. Venous sinuses: Grossly patent allowing for timing the contrast bolus. Anatomic variants: None significant.  No aneurysm. Review of the MIP images confirms the above findings IMPRESSION: CT HEAD IMPRESSION: 1. Previously identified subcentimeter infarct involving the right external capsule not well visualized by CT. 2. No other acute intracranial abnormality. 3. Underlying atrophy with moderate chronic microvascular ischemic disease. CTA HEAD AND NECK IMPRESSION: 1. Negative CTA for large vessel occlusion. 2. Mild for age atheromatous change about the carotid bifurcations and carotid siphons without hemodynamically significant stenosis. Electronically Signed   By: BJeannine BogaM.D.   On: 03/21/2021 21:14    ____________________________________________   PROCEDURES  Procedure(s) performed (including Critical Care):  .1-3 Lead EKG Interpretation Performed by: SLucrezia Starch MD Authorized by: SLucrezia Starch MD     Interpretation: non-specific     ECG rate assessment: normal     Rhythm: sinus rhythm     Ectopy: none     Conduction: normal     ____________________________________________   INITIAL IMPRESSION / ASSESSMENT AND PLAN / ED COURSE  Patient presents with above-stated history and exam for evaluation of an acute CVA seen on MRI obtained on the 16th that was initially ordered to evaluate some worsening memory problems and per patient some difficulties with getting the proper words out.  She denies any other new symptoms and states that the  cough congestion and some shortness of breath with exertion she has been experiencing over the last 2 or 3 weeks since recently being diagnosed with COVID involved been getting much better.  On arrival she is hypertensive and slight tachycardic with otherwise stable vital signs.  She is alert and oriented and does not seem dysarthric and has no objective neurological deficits.  Was able to view her MRI obtained on the 15th that showed 7 mm acute infarct in the right external capsule and moderate to advanced chronic small vessel ischemic changes without other clear acute significant process.  There was notation of some left mastoid effusion and fairly nasal sinus disease.  Patient did come to the emergency room on 12/16 attempting to be evaluated but left prior to being seen.  It seems that in triage she had a chest x-ray order my interpretation of this there is no evidence of a focal consolidation, overt edema, pneumothorax or other clear acute process.  Patient also had a COVID swab sent when she came as well some basic labs.  Her COVID result did return still positive.  Her CMP 3 days ago as well as CBC and troponin x2 are all unremarkable.  ECG today remarkable sinus rhythm with a ventricular rate of 93, normal axis, unremarkable intervals without evidence of acute ischemia or significant arrhythmia.  At this point she has objective evidence of an acute CVA and she is amenable to staying to be admitted for further evaluation management.  I have a low suspicion for interim development of other significant metabolic or electrolyte derangements and resulting in history of preceding trauma or injury and Evalose patient for toxic ingestion.  We will send routine stroke labs and put into a CTA head and neck but she likely will also require echocardiogram of her heart.  I will plan to admit to medicine service for further evaluation and management.  CTA head and neck shows previously identified subcentimeter  infarct involving the right external capsule but no evidence of hemorrhage, other ischemia or other clear acute intracranial process.  There is no evidence of significant large vessel occlusion or stenosis but mild atherosclerotic changes at the carotid bifurcations.  There is underlying atrophy and moderate chronic microvascular ischemic disease noted.  CBC shows no leukocytosis or acute anemia.  CMP shows no significant electrolyte or metabolic derangements. 12.  INR is within normal limits.  UDS positive for benzos.  UA is some rare bacteria and WBCs as well as large leukocyte esterase concerning for possible cystitis.  Urine culture sent.  Lipid panel shows elevated cholesterol.      ____________________________________________   FINAL CLINICAL IMPRESSION(S) / ED DIAGNOSES  Final diagnoses:  Cerebrovascular accident (CVA), unspecified mechanism ([place])  Acute cystitis without hematuria    Medications  aspirin EC tablet 81 mg (has no administration in time range)  atorvastatin (LIPITOR) tablet 10 mg (has no administration in time range)  losartan (COZAAR) tablet 50 mg (has no administration in time range)  buPROPion (WELLBUTRIN XL) 24 hr tablet 300 mg (has no administration in time range)  donepezil (ARICEPT) tablet 5 mg (has no administration in time range)  DULoxetine (CYMBALTA) DR capsule 30 mg (has no  administration in time range)  temazepam (RESTORIL) capsule 22.5 mg (has no administration in time range)  pantoprazole (PROTONIX) EC tablet 40 mg (has no administration in time range)  lamoTRIgine (LAMICTAL) tablet 200 mg (has no administration in time range)  cholecalciferol (VITAMIN D3) tablet 1,000 Units (has no administration in time range)   stroke: mapping our early stages of recovery book (has no administration in time range)  enoxaparin (LOVENOX) injection 40 mg (has no administration in time range)  0.9 %  sodium chloride infusion (has no administration in time range)   acetaminophen (TYLENOL) tablet 650 mg (has no administration in time range)    Or  acetaminophen (TYLENOL) suppository 650 mg (has no administration in time range)  magnesium hydroxide (MILK OF MAGNESIA) suspension 30 mL (has no administration in time range)  ondansetron (ZOFRAN) tablet 4 mg (has no administration in time range)    Or  ondansetron (ZOFRAN) injection 4 mg (has no administration in time range)  traZODone (DESYREL) tablet 100 mg (has no administration in time range)  vitamin E capsule 800 Units (has no administration in time range)    And  vitamin E capsule 200 Units (has no administration in time range)  cefTRIAXone (ROCEPHIN) 1 g in sodium chloride 0.9 % 100 mL IVPB (has no administration in time range)  iohexol (OMNIPAQUE) 350 MG/ML injection 75 mL (75 mLs Intravenous Contrast Given 03/21/21 2026)     ED Discharge Orders     None        Note:  This document was prepared using Dragon voice recognition software and may include unintentional dictation errors.    Lucrezia Starch, MD 03/21/21 2133

## 2021-03-21 NOTE — ED Notes (Signed)
Pt reports she went for an mri last week that was scheduled.  Pt was not having any sx.  Pt reports cramp in her left left 1 week ago.  No headache.  No slurred speech .  No facial droop.   Pt alert  speech clear.    Family with pt.

## 2021-03-22 ENCOUNTER — Observation Stay (HOSPITAL_BASED_OUTPATIENT_CLINIC_OR_DEPARTMENT_OTHER)
Admit: 2021-03-22 | Discharge: 2021-03-22 | Disposition: A | Discharge status: Home / Self Care | Payer: Medicare HMO | Attending: Internal Medicine | Admitting: Internal Medicine

## 2021-03-22 DIAGNOSIS — I639 Cerebral infarction, unspecified: Secondary | ICD-10-CM | POA: Diagnosis not present

## 2021-03-22 DIAGNOSIS — I6389 Other cerebral infarction: Secondary | ICD-10-CM | POA: Diagnosis not present

## 2021-03-22 LAB — LIPID PANEL
Cholesterol: 203 mg/dL — ABNORMAL HIGH (ref 0–200)
HDL: 94 mg/dL (ref >40)
LDL Cholesterol: 82 mg/dL (ref 0–99)
Total CHOL/HDL Ratio: 2.2 RATIO
Triglycerides: 135 mg/dL (ref <150)
VLDL: 27 mg/dL (ref 0–40)

## 2021-03-22 LAB — URINE CULTURE

## 2021-03-22 LAB — HEMOGLOBIN A1C
Hgb A1c MFr Bld: 5.9 % — ABNORMAL HIGH (ref 4.8–5.6)
Hgb A1c MFr Bld: 6 % — ABNORMAL HIGH (ref 4.8–5.6)
Mean Plasma Glucose: 123 mg/dL
Mean Plasma Glucose: 126 mg/dL

## 2021-03-22 LAB — ECHOCARDIOGRAM COMPLETE BUBBLE STUDY
AR max vel: 2.87 cm2
AV Area VTI: 2.42 cm2
AV Area mean vel: 2.79 cm2
AV Mean grad: 4 mmHg
AV Peak grad: 6.6 mmHg
Ao pk vel: 1.28 m/s
Area-P 1/2: 4.74 cm2
MV VTI: 3.45 cm2
S' Lateral: 2.34 cm

## 2021-03-22 LAB — CBG MONITORING, ED
Glucose-Capillary: 205 mg/dL — ABNORMAL HIGH (ref 70–99)
Glucose-Capillary: 108 mg/dL — ABNORMAL HIGH (ref 70–99)

## 2021-03-22 LAB — RESP PANEL BY RT-PCR (FLU A&B, COVID) ARPGX2
Influenza A by PCR: NEGATIVE
Influenza B by PCR: NEGATIVE
SARS Coronavirus 2 by RT PCR: POSITIVE — AB

## 2021-03-22 MED ORDER — ATORVASTATIN CALCIUM 20 MG PO TABS
80.0000 mg | ORAL_TABLET | Freq: Every day | ORAL | Status: DC
  Administered 2021-03-22: 80 mg via ORAL
  Filled 2021-03-22: qty 4

## 2021-03-22 MED ORDER — ATORVASTATIN CALCIUM 80 MG PO TABS: 80.0000 mg | ORAL_TABLET | Freq: Every day | ORAL | 2 refills | Status: DC

## 2021-03-22 MED ORDER — CLOPIDOGREL BISULFATE 75 MG PO TABS
75.0000 mg | ORAL_TABLET | Freq: Every day | ORAL | 0 refills | Status: AC
Start: 2021-03-23 — End: 2021-04-13

## 2021-03-22 MED ORDER — CLOPIDOGREL BISULFATE 75 MG PO TABS
75.0000 mg | ORAL_TABLET | Freq: Every day | ORAL | Status: DC
  Administered 2021-03-22: 75 mg via ORAL
  Filled 2021-03-22: qty 1

## 2021-03-22 NOTE — Discharge Summary (Signed)
Physician Discharge Summary  Katherine Jimenez SHF:[id number] DOB: [birth year] DOA: 03/21/2021  PCP: Crecencio Mc, MD  Admit date: 03/21/2021 Discharge date: 03/22/2021  Admitted From: Home Disposition: Home  Recommendations for Outpatient Follow-up:  Follow up with PCP in 1-2 weeks Follow-up with neurology Please obtain BMP/CBC in one week Please follow up on the following pending results: Urine culture  Home Health: No Equipment/Devices: None Discharge Condition: Stable CODE STATUS: Full Diet recommendation: Heart Healthy / Carb Modified /  Brief/Interim Summary: Katherine Jimenez is a 70 y.o. Caucasian femalee with medical history significant for type 2 diabetes mellitus, hypertension, depression with bipolar disorder, who presented to the ER with acute onset of CVA that was seen on MRI on 12/15 by her neurologist who advised her to come to the ER for further work-up on 12/16.  She waited 7 hours and left.  She was advised by her neurologist to take her statin therapy and aspirin.  She was then seen by her PCP today who referred her to the ER. She denied any paresthesias or focal muscle weakness. Patient was diagnosed with COVID-19 infection approximately 2 weeks ago.  She had mild dyspnea and cough which has been improved now.  No urinary symptoms.  UDS was positive for benzos but she takes Restoril at home.  Rest of the labs were mostly unremarkable.  Total cholesterol at 250, LDL 111 and triglyceride 130.  Goal LDL less than 70. CTA negative for any large vessel occlusion.  Echocardiogram with bubble studies are within normal limit.  Our PT did not recommend any follow-up. MRI on 12/15 shows these findings 7 mm acute infarct within the right external capsule.   Moderate/advanced chronic small-vessel ischemic changes within the cerebral white matter, slightly progressed from the brain MRI of 11/16/2018. Minimal chronic small-vessel ischemic changes are also present within the  pons.   Volumetric analysis of the brain was performed, with a fully detailed report in [facility], [facility], [facility]. Briefly, the comparison with age and gender matched reference reveals a whole-brain volume normative percentile of 7. However, this degree of volume loss is not appreciated subjectively.   Paranasal sinus disease, as described. Correlate for acute sinusitis.   Small left mastoid effusion UA with some bacteria and leukocyte.  Patient was discharged on aspirin and Plavix for 3 weeks and then she will continue with aspirin only.  She started aspirin recently so we will not consider it as aspirin failure.  We also increased the dose of Lipitor from 10 mg to 80 mg daily to maximize risk modification.  She will follow-up with her neurologist as an outpatient for further recommendations.  Her UA also shows some leukocytes and bacteria.  No urinary symptoms.  No leukocytosis.  And she was afebrile.  She was given ceftriaxone in the ED but there is no need to continue antibiotics at this time.  Urine cultures results were pending, most likely a colonization.  Patient is on multiple psych medications and will continue with her current medical regimen except the changes mentioned above and will follow-up with her providers for further recommendations.  Discharge Diagnoses:  Principal Problem:   CVA (cerebral vascular accident) [facility])   Discharge Instructions  Discharge Instructions     Diet - low sodium heart healthy   Complete by: As directed    Discharge instructions   Complete by: As directed    It was pleasure taking care of you. You are being given aspirin and Plavix together for 3 weeks, then stop  taking Plavix and continue with aspirin. We also increased the dose of Lipitor, please take according to the new prescription. Please follow-up with your neurologist for further recommendations   Increase activity slowly   Complete by: As directed       Allergies as of 03/22/2021        Reactions   Thimerosal Itching        Medication List     TAKE these medications    acetaminophen 500 MG tablet Commonly known as: TYLENOL Take 1,000 mg by mouth every 6 (six) hours as needed (FOR PAIN.).   aspirin EC 81 MG tablet Take 81 mg by mouth daily. Swallow whole.   atorvastatin 80 MG tablet Commonly known as: LIPITOR Take 1 tablet (80 mg total) by mouth daily. Start taking on: March 23, 2021 What changed:  medication strength how much to take   Biotin 5 MG Caps Take 2 capsules by mouth 2 (two) times daily.   blood glucose meter kit and supplies Dispense Contour next bayer meter. E11.9 To check blood glucose  Once a day.   buPROPion 300 MG 24 hr tablet Commonly known as: WELLBUTRIN XL TAKE ONE TABLET BY MOUTH DAILY   clopidogrel 75 MG tablet Commonly known as: PLAVIX Take 1 tablet (75 mg total) by mouth daily for 21 days. Start taking on: March 23, 2021   cyanocobalamin 1000 MCG/ML injection Commonly known as: (VITAMIN B-12) Inject 1 mL (1,000 mcg total) into the muscle every 30 (thirty) days.   diphenhydramine-acetaminophen 25-500 MG Tabs tablet Commonly known as: TYLENOL PM Take 1 tablet by mouth at bedtime as needed.   donepezil 5 MG tablet Commonly known as: ARICEPT Take 5 mg by mouth daily.   DULoxetine 30 MG capsule Commonly known as: CYMBALTA TAKE ONE CAPSULE BY MOUTH DAILY   freestyle lancets Use once daily to check blood sugars  E11.9   glucose blood test strip Commonly known as: FREESTYLE LITE Use once daily  as instructed to test blood sugar E 11.9   lamoTRIgine 200 MG tablet Commonly known as: LAMICTAL TAKE ONE TABLET BY MOUTH TWICE A DAY   losartan 25 MG tablet Commonly known as: COZAAR TAKE TWO TABLETS BY MOUTH DAILY   meloxicam 15 MG tablet Commonly known as: MOBIC Take 1 tablet (15 mg total) by mouth daily.   metFORMIN 750 MG 24 hr tablet Commonly known as: GLUCOPHAGE-XR TAKE ONE TABLET BY MOUTH DAILY WITH  BREAKFAST   omeprazole 40 MG capsule Commonly known as: PRILOSEC TAKE ONE CAPSULE BY MOUTH DAILY   Ozempic (0.25 or 0.5 MG/DOSE) 2 MG/1.5ML Sopn Generic drug: Semaglutide(0.25 or 0.5MG/DOS) Inject 0.5 mg into the skin once a week. Inject 0.25 mg weekly for 4 weeks then increase to 0.5 mg weekly   Syringe (Disposable) 1 ML Misc For use with B12 injections   temazepam 22.5 MG capsule Commonly known as: RESTORIL Take 1 capsule (22.5 mg total) by mouth at bedtime as needed for sleep.   tiZANidine 2 MG tablet Commonly known as: ZANAFLEX Take 1 tablet (2 mg total) by mouth in the morning and at bedtime. What changed:  when to take this reasons to take this   traZODone 100 MG tablet Commonly known as: DESYREL One tablet by mouth one hour before bedtime   Vitamin D3 25 MCG (1000 UT) Caps Take 1,000 Units by mouth daily.   vitamin E 1000 UNIT capsule Take 1,000 Units by mouth daily.        Follow-up Information  Crecencio Mc, MD. Schedule an appointment as soon as possible for a visit in 1 week(s).   Specialty: Internal Medicine Contact information: [address] [place] [address] [phone]         Vladimir Crofts, MD. Schedule an appointment as soon as possible for a visit in 1 week(s).   Specialty: Neurology Contact information: [facility] [place] [address] [phone]                Allergies  Allergen Reactions   Thimerosal Itching    Consultations: Neurology  Procedures/Studies: CT ANGIO HEAD NECK W WO CM  Result Date: 03/21/2021 CLINICAL DATA:  Initial evaluation for neuro deficit, stroke suspected. EXAM: CT ANGIOGRAPHY HEAD AND NECK TECHNIQUE: Multidetector CT imaging of the head and neck was performed using the standard protocol during bolus administration of intravenous contrast. Multiplanar CT image reconstructions and MIPs were obtained to evaluate the vascular  anatomy. Carotid stenosis measurements (when applicable) are obtained utilizing NASCET criteria, using the distal internal carotid diameter as the denominator. CONTRAST:  73m OMNIPAQUE IOHEXOL 350 MG/ML SOLN COMPARISON:  MRI from 03/17/2021. FINDINGS: CT HEAD FINDINGS Brain: Previously identified subcentimeter ischemic infarct involving the right external capsule not well visualized by CT. No other acute large vessel territory infarct. No acute intracranial hemorrhage. Age-related cerebral atrophy with moderately advanced chronic microvascular ischemic disease. No mass lesion or midline shift. No hydrocephalus or extra-axial fluid collection. Vascular: No hyperdense vessel. Skull: Scalp soft tissues and calvarium within normal limits. Sinuses: Air-fluid level noted within the right sphenoid sinus. Paranasal sinuses are otherwise clear. Trace left mastoid effusion noted, of doubtful significance. Orbits: Globes and orbital soft tissues demonstrate no acute finding. Review of the MIP images confirms the above findings CTA NECK FINDINGS Aortic arch: Visualized aortic arch normal caliber with normal branch pattern. No stenosis about the origin of the great vessels. Right carotid system: Right common and internal carotid arteries patent without stenosis, dissection or occlusion. Mild for age atheromatous change about the right carotid bulb without stenosis. Left carotid system: Left common and internal carotid arteries patent without stenosis, dissection or occlusion. Mild for age atheromatous change about the left carotid bulb without stenosis. Vertebral arteries: Both vertebral arteries arise from subclavian arteries. No proximal subclavian artery stenosis. Left vertebral artery dominant. Vertebral arteries patent without stenosis, dissection or occlusion. Skeleton: No discrete or worrisome osseous lesions. Moderate spondylosis noted at C5-6 with resultant mild spinal stenosis. Other neck: No other acute soft tissue  abnormality within the neck. Few scattered subcentimeter nodules noted within the thyroid, largest of which measures 5 mm on the left. These are of doubtful significance given size and patient age, with no follow-up imaging recommended (ref: J Am Coll Radiol. 2015 Feb;12(2): 143-50). Upper chest: Visualized upper chest demonstrates no acute finding. Review of the MIP images confirms the above findings CTA HEAD FINDINGS Anterior circulation: Both internal carotid arteries widely patent to the termini without stenosis. A1 segments widely patent. Normal anterior communicating artery complex. Anterior cerebral arteries widely patent proximally. Focal calcified plaque noted at the junction of the right A2/A3 segment. No M1 stenosis or occlusion. Normal MCA bifurcations. Distal MCA branches perfused and symmetric. Posterior circulation: Mild atheromatous plaque within the V4 segments bilaterally with associated short-segment mild stenoses. Left vertebral artery dominant. Both PICA origins patent and normal. Basilar patent to its distal aspect without stenosis. Superior cerebellar arteries patent bilaterally. Both PCAs primarily supplied via the basilar well perfused  or distal aspects. Venous sinuses: Grossly patent allowing for timing the contrast bolus. Anatomic variants: None significant.  No aneurysm. Review of the MIP images confirms the above findings IMPRESSION: CT HEAD IMPRESSION: 1. Previously identified subcentimeter infarct involving the right external capsule not well visualized by CT. 2. No other acute intracranial abnormality. 3. Underlying atrophy with moderate chronic microvascular ischemic disease. CTA HEAD AND NECK IMPRESSION: 1. Negative CTA for large vessel occlusion. 2. Mild for age atheromatous change about the carotid bifurcations and carotid siphons without hemodynamically significant stenosis. Electronically Signed   By: Jeannine Boga M.D.   On: 03/21/2021 21:14   DG Chest 2 View  Result  Date: 03/18/2021 CLINICAL DATA:  Shortness of breath EXAM: CHEST - 2 VIEW COMPARISON:  08/01/2019 FINDINGS: Cardiac size is within normal limits. Thoracic aorta is tortuous. There are no signs of pulmonary edema or focal pulmonary consolidation. Crowding of markings in the medial left lower lung fields has not changed. There is no pleural effusion or pneumothorax. IMPRESSION: There are no signs of pulmonary edema or focal pulmonary consolidation. There is no pleural effusion or pneumothorax. Crowding of markings in the medial left lower lung fields as not changed significantly possibly suggesting scarring. Electronically Signed   By: Elmer Picker M.D.   On: 03/18/2021 13:29   MR BRAIN WO CONTRAST  Result Date: 03/17/2021 CLINICAL DATA:  Provided history: Memory loss. EXAM: MRI HEAD WITHOUT CONTRAST TECHNIQUE: Multiplanar, multiecho pulse sequences of the brain and surrounding structures were obtained without intravenous contrast. Additionally, using NeuroQuant software a 3D volumetric analysis of the brain was performed and is compared to a normative database adjusted for age, gender and intracranial volume. COMPARISON:  Brain MRI 11/16/2018. FINDINGS: Brain: Subjectively, there is mild-for-age generalized cerebral and cerebellar atrophy. 7 mm focus of restricted diffusion within the right external capsule compatible with acute infarction (series 3, image 26). Moderate/advanced multifocal T2 FLAIR hyperintense signal abnormality within the cerebral white matter, nonspecific but compatible with chronic small vessel ischemic disease. Minimal chronic small vessel ischemic changes are also present within the pons. Partially empty sella turcica. No evidence of an intracranial mass. No chronic intracranial blood products. No extra-axial fluid collection. No midline shift. Vascular: Maintained flow voids within the proximal large arterial vessels. Skull and upper cervical spine: No focal suspicious marrow  lesion. Sinuses/Orbits: Visualized orbits show no acute finding. Bilateral lens replacement. Mild mucosal thickening within the right frontal sinus. Mucosal thickening within the bilateral ethmoid sinuses (mild right, moderate left). Large fluid level, and background mild mucosal thickening, within the right sphenoid sinus. Mild mucosal thickening within the left sphenoid sinus. Trace fluid level within the left maxillary sinus. Other: Small left mastoid effusion. NeuroQuant Findings: Volumetric analysis of the brain was performed, with a fully detailed report in [facility]. Briefly, the comparison with age and gender matched reference reveals a whole brain volume normative percentile of 7. Impression #1 will be called to the ordering clinician or representative by the Radiologist Assistant, and communication documented in the PACS or [employer]. IMPRESSION: 7 mm acute infarct within the right external capsule. Moderate/advanced chronic small-vessel ischemic changes within the cerebral white matter, slightly progressed from the brain MRI of 11/16/2018. Minimal chronic small-vessel ischemic changes are also present within the pons. Volumetric analysis of the brain was performed, with a fully detailed report in [facility]. Briefly, the comparison with age and gender matched reference reveals a whole-brain volume normative percentile of 7. However, this degree of volume loss is not  appreciated subjectively. Paranasal sinus disease, as described. Correlate for acute sinusitis. Small left mastoid effusion. Electronically Signed   By: Kellie Simmering D.O.   On: 03/17/2021 18:16   ECHOCARDIOGRAM COMPLETE BUBBLE STUDY  Result Date: 03/22/2021    ECHOCARDIOGRAM REPORT   Patient Name:   Katherine Jimenez Date of Exam: 03/22/2021 Medical Rec #:  [id number]       Height:       60.0 in Accession #:    [id number]      Weight:       149.9 lb Date of Birth:  1951/03/31      BSA:          1.651 m Patient Age:    27 years         BP:           116/71 mmHg Patient Gender: F               HR:           87 bpm. Exam Location:  [facility] Procedure: 2D Echo, Color Doppler, Cardiac Doppler and Saline Contrast Bubble            Study Indications:     I63.9 Stroke  History:         Patient has no prior history of Echocardiogram examinations.                  Risk Factors:Hypertension and Diabetes.  Sonographer:     Charmayne Sheer Referring Phys:  [id number] Braya Habermehl Diagnosing Phys: Kathlyn Sacramento MD IMPRESSIONS  1. Left ventricular ejection fraction, by estimation, is 60 to 65%. The left ventricle has normal function. The left ventricle has no regional wall motion abnormalities. Left ventricular diastolic parameters are consistent with Grade I diastolic dysfunction (impaired relaxation).  2. Right ventricular systolic function is normal. The right ventricular size is normal. Tricuspid regurgitation signal is inadequate for assessing PA pressure.  3. The mitral valve is normal in structure. No evidence of mitral valve regurgitation. No evidence of mitral stenosis.  4. The aortic valve is normal in structure. Aortic valve regurgitation is not visualized. No aortic stenosis is present.  5. Agitated saline contrast bubble study was negative, with no evidence of any interatrial shunt. FINDINGS  Left Ventricle: Left ventricular ejection fraction, by estimation, is 60 to 65%. The left ventricle has normal function. The left ventricle has no regional wall motion abnormalities. The left ventricular internal cavity size was normal in size. There is  no left ventricular hypertrophy. Left ventricular diastolic parameters are consistent with Grade I diastolic dysfunction (impaired relaxation). Right Ventricle: The right ventricular size is normal. No increase in right ventricular wall thickness. Right ventricular systolic function is normal. Tricuspid regurgitation signal is inadequate for assessing PA pressure. Left Atrium: Left atrial size was normal in size.  Right Atrium: Right atrial size was normal in size. Pericardium: There is no evidence of pericardial effusion. Mitral Valve: The mitral valve is normal in structure. No evidence of mitral valve regurgitation. No evidence of mitral valve stenosis. MV peak gradient, 3.9 mmHg. The mean mitral valve gradient is 2.0 mmHg. Tricuspid Valve: The tricuspid valve is normal in structure. Tricuspid valve regurgitation is not demonstrated. No evidence of tricuspid stenosis. Aortic Valve: The aortic valve is normal in structure. Aortic valve regurgitation is not visualized. No aortic stenosis is present. Aortic valve mean gradient measures 4.0 mmHg. Aortic valve peak gradient measures 6.6 mmHg. Aortic valve area, by VTI measures 2.42  cm. Pulmonic Valve: The pulmonic valve was normal in structure. Pulmonic valve regurgitation is trivial. No evidence of pulmonic stenosis. Aorta: The aortic root is normal in size and structure. Venous: The inferior vena cava was not well visualized. IAS/Shunts: No atrial level shunt detected by color flow Doppler. Agitated saline contrast was given intravenously to evaluate for intracardiac shunting. Agitated saline contrast bubble study was negative, with no evidence of any interatrial shunt.  LEFT VENTRICLE PLAX 2D LVIDd:         3.29 cm   Diastology LVIDs:         2.34 cm   LV e' medial:    6.42 cm/s LV PW:         0.95 cm   LV E/e' medial:  13.7 LV IVS:        0.95 cm   LV e' lateral:   8.59 cm/s LVOT diam:     2.20 cm   LV E/e' lateral: 10.2 LV SV:         63 LV SV Index:   38 LVOT Area:     3.80 cm  LEFT ATRIUM           Index LA diam:      3.50 cm 2.12 cm/m LA Vol (A2C): 23.3 ml 14.11 ml/m  AORTIC VALVE                    PULMONIC VALVE AV Area (Vmax):    2.87 cm     PV Vmax:          1.09 m/s AV Area (Vmean):   2.79 cm     PV Vmean:         78.800 cm/s AV Area (VTI):     2.42 cm     PV VTI:           0.216 m AV Vmax:           128.00 cm/s  PV Peak grad:     4.8 mmHg AV Vmean:           89.900 cm/s  PV Mean grad:     3.0 mmHg AV VTI:            0.259 m      PR End Diast Vel: 3.17 msec AV Peak Grad:      6.6 mmHg AV Mean Grad:      4.0 mmHg LVOT Vmax:         96.60 cm/s LVOT Vmean:        65.900 cm/s LVOT VTI:          0.165 m LVOT/AV VTI ratio: 0.64  AORTA Ao Root diam: 3.30 cm MITRAL VALVE MV Area (PHT): 4.74 cm     SHUNTS MV Area VTI:   3.45 cm     Systemic VTI:  0.16 m MV Peak grad:  3.9 mmHg     Systemic Diam: 2.20 cm MV Mean grad:  2.0 mmHg MV Vmax:       0.99 m/s MV Vmean:      63.1 cm/s MV Decel Time: 160 msec MV E velocity: 87.80 cm/s MV A velocity: 106.00 cm/s MV E/A ratio:  0.83 Kathlyn Sacramento MD Electronically signed by Kathlyn Sacramento MD Signature Date/Time: 03/22/2021/1:53:13 PM    Final     Subjective: Patient was seen and examined today.  No new complaints.  No new deficit.  She appears at her baseline.  Discussed about risk modification and the  use of DAPT for 3 weeks.  Patient has outpatient neurologist and will follow-up with him.  Discharge Exam: Vitals:   03/22/21 0935 03/22/21 1215  BP: 116/71 136/70  Pulse: 91 83  Resp: 16 16  Temp:  98 F (36.7 C)  SpO2: 97% 99%   Vitals:   03/22/21 0600 03/22/21 0700 03/22/21 0935 03/22/21 1215  BP: (!) 139/99 (!) 142/99 116/71 136/70  Pulse: 81 88 91 83  Resp: '17 16 16 16  ' Temp:    98 F (36.7 C)  TempSrc:    Oral  SpO2: 98% 96% 97% 99%  Weight:      Height:        General: Pt is alert, awake, not in acute distress Cardiovascular: RRR, S1/S2 +, no rubs, no gallops Respiratory: CTA bilaterally, no wheezing, no rhonchi Abdominal: Soft, NT, ND, bowel sounds + Extremities: no edema, no cyanosis   The results of significant diagnostics from this hospitalization (including imaging, microbiology, ancillary and laboratory) are listed below for reference.    Microbiology: Recent Results (from the past 240 hour(s))  Resp Panel by RT-PCR (Flu A&B, Covid) Nasopharyngeal Swab     Status: Abnormal   Collection  Time: 03/18/21  1:00 PM   Specimen: Nasopharyngeal Swab; Nasopharyngeal(NP) swabs in vial transport medium  Result Value Ref Range Status   SARS Coronavirus 2 by RT PCR POSITIVE (A) NEGATIVE Final    Comment: (NOTE) SARS-CoV-2 target nucleic acids are DETECTED.  The SARS-CoV-2 RNA is generally detectable in upper respiratory specimens during the acute phase of infection. Positive results are indicative of the presence of the identified virus, but do not rule out bacterial infection or co-infection with other pathogens not detected by the test. Clinical correlation with patient history and other diagnostic information is necessary to determine patient infection status. The expected result is Negative.  Fact Sheet for Patients: EntrepreneurPulse.com.au  Fact Sheet for Healthcare Providers: IncredibleEmployment.be  This test is not yet approved or cleared by the [place] FDA and  has been authorized for detection and/or diagnosis of SARS-CoV-2 by FDA under an Emergency Use Authorization (EUA).  This EUA will remain in effect (meaning this test can be used) for the duration of  the COVID-19 declaration under Section 564(b)(1) of the A ct, 21 U.S.C. section 360bbb-3(b)(1), unless the authorization is terminated or revoked sooner.     Influenza A by PCR NEGATIVE NEGATIVE Final   Influenza B by PCR NEGATIVE NEGATIVE Final    Comment: (NOTE) The Xpert Xpress SARS-CoV-2/FLU/RSV plus assay is intended as an aid in the diagnosis of influenza from Nasopharyngeal swab specimens and should not be used as a sole basis for treatment. Nasal washings and aspirates are unacceptable for Xpert Xpress SARS-CoV-2/FLU/RSV testing.  Fact Sheet for Patients: EntrepreneurPulse.com.au  Fact Sheet for Healthcare Providers: IncredibleEmployment.be  This test is not yet approved or cleared by the [place] FDA and has been  authorized for detection and/or diagnosis of SARS-CoV-2 by FDA under an Emergency Use Authorization (EUA). This EUA will remain in effect (meaning this test can be used) for the duration of the COVID-19 declaration under Section 564(b)(1) of the Act, 21 U.S.C. section 360bbb-3(b)(1), unless the authorization is terminated or revoked.  Performed at [facility], [address] [place], [place] [address]   Resp Panel by RT-PCR (Flu A&B, Covid) Nasopharyngeal Swab     Status: Abnormal   Collection Time: 03/22/21  4:31 AM   Specimen: Nasopharyngeal Swab; Nasopharyngeal(NP) swabs in vial transport  medium  Result Value Ref Range Status   SARS Coronavirus 2 by RT PCR POSITIVE (A) NEGATIVE Final    Comment: (NOTE) SARS-CoV-2 target nucleic acids are DETECTED.  The SARS-CoV-2 RNA is generally detectable in upper respiratory specimens during the acute phase of infection. Positive results are indicative of the presence of the identified virus, but do not rule out bacterial infection or co-infection with other pathogens not detected by the test. Clinical correlation with patient history and other diagnostic information is necessary to determine patient infection status. The expected result is Negative.  Fact Sheet for Patients: EntrepreneurPulse.com.au  Fact Sheet for Healthcare Providers: IncredibleEmployment.be  This test is not yet approved or cleared by the [place] FDA and  has been authorized for detection and/or diagnosis of SARS-CoV-2 by FDA under an Emergency Use Authorization (EUA).  This EUA will remain in effect (meaning this test can be used) for the duration of  the COVID-19 declaration under Section 564(b)(1) of the A ct, 21 U.S.C. section 360bbb-3(b)(1), unless the authorization is terminated or revoked sooner.     Influenza A by PCR NEGATIVE NEGATIVE Final   Influenza B by PCR NEGATIVE NEGATIVE Final    Comment:  (NOTE) The Xpert Xpress SARS-CoV-2/FLU/RSV plus assay is intended as an aid in the diagnosis of influenza from Nasopharyngeal swab specimens and should not be used as a sole basis for treatment. Nasal washings and aspirates are unacceptable for Xpert Xpress SARS-CoV-2/FLU/RSV testing.  Fact Sheet for Patients: EntrepreneurPulse.com.au  Fact Sheet for Healthcare Providers: IncredibleEmployment.be  This test is not yet approved or cleared by the [place] FDA and has been authorized for detection and/or diagnosis of SARS-CoV-2 by FDA under an Emergency Use Authorization (EUA). This EUA will remain in effect (meaning this test can be used) for the duration of the COVID-19 declaration under Section 564(b)(1) of the Act, 21 U.S.C. section 360bbb-3(b)(1), unless the authorization is terminated or revoked.  Performed at [facility], [address] [place],  [address]      Labs: BNP (last 3 results) No results for input(s): BNP in the last 8760 hours. Basic Metabolic Panel: Recent Labs  Lab 03/18/21 1300 03/21/21 1955  NA 138 135  K 4.5 4.0  CL 103 99  CO2 29 27  GLUCOSE 112* 117*  BUN 17 13  CREATININE 0.67 0.79  CALCIUM 9.8 9.5   Liver Function Tests: Recent Labs  Lab 03/18/21 1300 03/21/21 1955  AST 23 20  ALT 24 23  ALKPHOS 87 91  BILITOT 0.6 0.6  PROT 7.9 7.7  ALBUMIN 4.4 4.7   No results for input(s): LIPASE, AMYLASE in the last 168 hours. No results for input(s): AMMONIA in the last 168 hours. CBC: Recent Labs  Lab 03/18/21 1300 03/21/21 1955  WBC 7.0 8.5  NEUTROABS 3.0 4.0  HGB 12.0 11.7*  HCT 39.3 38.2  MCV 82.9 84.5  PLT 402* 372   Cardiac Enzymes: No results for input(s): CKTOTAL, CKMB, CKMBINDEX, TROPONINI in the last 168 hours. BNP: Invalid input(s): POCBNP CBG: Recent Labs  Lab 03/22/21 0828 03/22/21 1211  GLUCAP 205* 108*   D-Dimer No results for input(s): DDIMER in the  last 72 hours. Hgb A1c No results for input(s): HGBA1C in the last 72 hours. Lipid Profile Recent Labs    03/21/21 1955 03/22/21 0431  CHOL 250* 203*  HDL 113 94  LDLCALC 111* 82  TRIG 130 135  CHOLHDL 2.2 2.2   Thyroid function studies No results for input(s):  TSH, T4TOTAL, T3FREE, THYROIDAB in the last 72 hours.  Invalid input(s): FREET3 Anemia work up No results for input(s): VITAMINB12, FOLATE, FERRITIN, TIBC, IRON, RETICCTPCT in the last 72 hours. Urinalysis    Component Value Date/Time   COLORURINE YELLOW (A) 03/21/2021 1900   APPEARANCEUR CLOUDY (A) 03/21/2021 1900   APPEARANCEUR Cloudy (A) 03/17/2019 1141   LABSPEC 1.012 03/21/2021 1900   PHURINE 7.0 03/21/2021 1900   GLUCOSEU NEGATIVE 03/21/2021 1900   HGBUR NEGATIVE 03/21/2021 1900   BILIRUBINUR NEGATIVE 03/21/2021 1900   BILIRUBINUR negative 09/07/2020 1155   BILIRUBINUR Negative 03/17/2019 1141   KETONESUR NEGATIVE 03/21/2021 1900   PROTEINUR NEGATIVE 03/21/2021 1900   UROBILINOGEN 0.2 09/07/2020 1155   NITRITE NEGATIVE 03/21/2021 1900   LEUKOCYTESUR LARGE (A) 03/21/2021 1900   Sepsis Labs Invalid input(s): PROCALCITONIN,  WBC,  LACTICIDVEN Microbiology Recent Results (from the past 240 hour(s))  Resp Panel by RT-PCR (Flu A&B, Covid) Nasopharyngeal Swab     Status: Abnormal   Collection Time: 03/18/21  1:00 PM   Specimen: Nasopharyngeal Swab; Nasopharyngeal(NP) swabs in vial transport medium  Result Value Ref Range Status   SARS Coronavirus 2 by RT PCR POSITIVE (A) NEGATIVE Final    Comment: (NOTE) SARS-CoV-2 target nucleic acids are DETECTED.  The SARS-CoV-2 RNA is generally detectable in upper respiratory specimens during the acute phase of infection. Positive results are indicative of the presence of the identified virus, but do not rule out bacterial infection or co-infection with other pathogens not detected by the test. Clinical correlation with patient history and other diagnostic information  is necessary to determine patient infection status. The expected result is Negative.  Fact Sheet for Patients: EntrepreneurPulse.com.au  Fact Sheet for Healthcare Providers: IncredibleEmployment.be  This test is not yet approved or cleared by the [place] FDA and  has been authorized for detection and/or diagnosis of SARS-CoV-2 by FDA under an Emergency Use Authorization (EUA).  This EUA will remain in effect (meaning this test can be used) for the duration of  the COVID-19 declaration under Section 564(b)(1) of the A ct, 21 U.S.C. section 360bbb-3(b)(1), unless the authorization is terminated or revoked sooner.     Influenza A by PCR NEGATIVE NEGATIVE Final   Influenza B by PCR NEGATIVE NEGATIVE Final    Comment: (NOTE) The Xpert Xpress SARS-CoV-2/FLU/RSV plus assay is intended as an aid in the diagnosis of influenza from Nasopharyngeal swab specimens and should not be used as a sole basis for treatment. Nasal washings and aspirates are unacceptable for Xpert Xpress SARS-CoV-2/FLU/RSV testing.  Fact Sheet for Patients: EntrepreneurPulse.com.au  Fact Sheet for Healthcare Providers: IncredibleEmployment.be  This test is not yet approved or cleared by the [place] FDA and has been authorized for detection and/or diagnosis of SARS-CoV-2 by FDA under an Emergency Use Authorization (EUA). This EUA will remain in effect (meaning this test can be used) for the duration of the COVID-19 declaration under Section 564(b)(1) of the Act, 21 U.S.C. section 360bbb-3(b)(1), unless the authorization is terminated or revoked.  Performed at [facility], [address] [place], [place] [address]   Resp Panel by RT-PCR (Flu A&B, Covid) Nasopharyngeal Swab     Status: Abnormal   Collection Time: 03/22/21  4:31 AM   Specimen: Nasopharyngeal Swab; Nasopharyngeal(NP) swabs in vial transport medium   Result Value Ref Range Status   SARS Coronavirus 2 by RT PCR POSITIVE (A) NEGATIVE Final    Comment: (NOTE) SARS-CoV-2 target nucleic acids are DETECTED.  The SARS-CoV-2 RNA is  generally detectable in upper respiratory specimens during the acute phase of infection. Positive results are indicative of the presence of the identified virus, but do not rule out bacterial infection or co-infection with other pathogens not detected by the test. Clinical correlation with patient history and other diagnostic information is necessary to determine patient infection status. The expected result is Negative.  Fact Sheet for Patients: EntrepreneurPulse.com.au  Fact Sheet for Healthcare Providers: IncredibleEmployment.be  This test is not yet approved or cleared by the [place] FDA and  has been authorized for detection and/or diagnosis of SARS-CoV-2 by FDA under an Emergency Use Authorization (EUA).  This EUA will remain in effect (meaning this test can be used) for the duration of  the COVID-19 declaration under Section 564(b)(1) of the A ct, 21 U.S.C. section 360bbb-3(b)(1), unless the authorization is terminated or revoked sooner.     Influenza A by PCR NEGATIVE NEGATIVE Final   Influenza B by PCR NEGATIVE NEGATIVE Final    Comment: (NOTE) The Xpert Xpress SARS-CoV-2/FLU/RSV plus assay is intended as an aid in the diagnosis of influenza from Nasopharyngeal swab specimens and should not be used as a sole basis for treatment. Nasal washings and aspirates are unacceptable for Xpert Xpress SARS-CoV-2/FLU/RSV testing.  Fact Sheet for Patients: EntrepreneurPulse.com.au  Fact Sheet for Healthcare Providers: IncredibleEmployment.be  This test is not yet approved or cleared by the [place] FDA and has been authorized for detection and/or diagnosis of SARS-CoV-2 by FDA under an Emergency Use Authorization (EUA).  This EUA will remain in effect (meaning this test can be used) for the duration of the COVID-19 declaration under Section 564(b)(1) of the Act, 21 U.S.C. section 360bbb-3(b)(1), unless the authorization is terminated or revoked.  Performed at [facility], [address] [place], [place] [address]     Time coordinating discharge: Over 30 minutes  SIGNED:  Lorella Nimrod, MD  [facility] 03/22/2021, 2:26 PM  If 7PM-7AM, please contact night-coverage [email or web address]  This record has been created using Systems analyst. Errors have been sought and corrected,but may not always be located. Such creation errors do not reflect on the standard of care.

## 2021-03-22 NOTE — Progress Notes (Signed)
OT Cancellation Note  Patient Details Name: Katherine Jimenez MRN: [id number] DOB: Feb 26, 1951   Cancelled Treatment:    Reason Eval/Treat Not Completed: OT screened, no needs identified, will sign off. Order received. Per chart review and discussion with PT, pt currently at baseline functional independence. No skilled acute OT needs identified at this time. Will sign off. Please re-consult if additional needs arise.    Fredirick Maudlin, OTR/L [place]

## 2021-03-22 NOTE — Progress Notes (Signed)
SLP Cancellation Note  Patient Details Name: CAMYLLE WHICKER MRN: [id number] DOB: Apr 03, 1951   Cancelled treatment:       Reason Eval/Treat Not Completed: SLP screened, no needs identified, will sign off  Gracey Tolle B. Rutherford Nail M.S., CCC-SLP, [place] Office [phone]  Stormy Fabian 03/22/2021, 12:04 PM

## 2021-03-22 NOTE — ED Notes (Signed)
Patient ambulatory to the restroom without assistance. Sitting at bedside eating breakfast, denies any needs at this time.

## 2021-03-22 NOTE — ED Notes (Signed)
Patient given discharge instructions, all questions answered. Patient in possession of all belongings, directed to the discharge area  

## 2021-03-22 NOTE — ED Notes (Signed)
Bedside echo still ongoing

## 2021-03-22 NOTE — Progress Notes (Signed)
*  PRELIMINARY RESULTS* Echocardiogram 2D Echocardiogram has been performed.  Katherine Jimenez 03/22/2021, 11:49 AM

## 2021-03-22 NOTE — ED Notes (Signed)
PT at bedside with patient

## 2021-03-22 NOTE — ED Notes (Signed)
Bedside echo being performed

## 2021-03-22 NOTE — Evaluation (Signed)
Physical Therapy Evaluation Patient Details Name: Katherine Jimenez MRN: [id number] DOB: [birth year] Today's Date: 03/22/2021  History of Present Illness  Katherine Jimenez is a 70 year old female with medical history significant for type 2 diabetes mellitus, hypertension, depression with bipolar disorder, who presented to the ER with acute onset of CVA that was seen on MRI on 12/15 by her neurologist (ordered by her neurologist for further evaluation with concern for possible dementia)  who advised her to come to the ER for further work-up on 12/16. CT of head repors Previously identified subcentimeter infarct involving the right external capsule.  Clinical Impression  PT evaluation completed. Patient is pleasant and cooperative with assessment. She is alert and oriented x 4 and able to follow all commands without difficulty. No focal deficits are noted with strength and coordination of extremities. Patient was able to get out of bed, stand, and ambulate with no physical assistance and no assistive device. She does complain of mild left thigh cramping sensation for the past several days but with improvement. Dynamic standing balance was normal with higher level balance activity including picking up objects from the floor, head turns, sudden stops while ambulating, turning 360 degrees in both directions, reaching overhead, etc. Patient appears to be at her baseline level of functional mobility with no PT needs at this time.      Recommendations for follow up therapy are one component of a multi-disciplinary discharge planning process, led by the attending physician.  Recommendations may be updated based on patient status, additional functional criteria and insurance authorization.  Follow Up Recommendations No PT follow up    Assistance Recommended at Discharge PRN (depending on if patient is allowed to drive)  Functional Status Assessment Patient has not had a recent decline in their functional status   Equipment Recommendations  None recommended by PT    Recommendations for Other Services       Precautions / Restrictions Precautions Precautions: None Restrictions Weight Bearing Restrictions: No      Mobility  Bed Mobility Overal bed mobility: Independent                  Transfers Overall transfer level: Independent                      Ambulation/Gait Ambulation/Gait assistance: Modified independent (Device/Increase time) Gait Distance (Feet): 125 Feet Assistive device: None Gait Pattern/deviations: WFL(Within Functional Limits) Gait velocity: normal     General Gait Details: patient ambulated around the unit without difficulty, gait pattern WNL and varying speed and with  head turns, sudden stops, without difficulty. patient does complain of mild left thigh cramping that overall has improved over the past few days  Stairs            Wheelchair Mobility    Modified Rankin (Stroke Patients Only)       Balance   Sitting-balance support: Feet unsupported;No upper extremity supported Sitting balance-Leahy Scale: Normal                       High level balance activites: Sudden stops;Head turns;Turns;Direction changes High Level Balance Comments: with neuro-reeducation treatment, patient is able to turn in each direction 360 degrees, reaching outside base of support and overhead with BUE, reaching to floor to pick up objects from floor with no loss of balance. no facilitation required for balance with close supervision provided. cues for safety with good safety awareness demonstrated for fall prevention. patient  educated to be aware of any changes with balance             Pertinent Vitals/Pain Pain Assessment: Faces Faces Pain Scale: Hurts a little bit Pain Location: left thigh with activity Pain Descriptors / Indicators: Cramping Pain Intervention(s): Limited activity within patient's tolerance;Monitored during session     Home Living Family/patient expects to be discharged to:: Private residence Living Arrangements: Alone     Home Access: Stairs to enter   Technical brewer of Steps: 6            Prior Function Prior Level of Function : Independent/Modified Independent;Driving             Mobility Comments: independent ADLs Comments: independent     Hand Dominance   Dominant Hand: Right    Extremity/Trunk Assessment   Upper Extremity Assessment Upper Extremity Assessment: LUE deficits/detail;RUE deficits/detail RUE Deficits / Details: shoulder flexion 5/5, elbow flexion/extension 5/5, good grip strength RUE Sensation: WNL RUE Coordination: WNL LUE Deficits / Details: shoulder flexion 5/5, elbow flexion/extension 5/5, good grip strength LUE Sensation: WNL (finger to nose, rapid alternating movement without dysmetira) LUE Coordination: WNL (finger to nose, rapid alternating movement without dysmetira)    Lower Extremity Assessment Lower Extremity Assessment: RLE deficits/detail;LLE deficits/detail RLE Deficits / Details: hip flexion 4+/5, knee extension 5/5, hip add/abd 5/5 RLE Sensation: WNL RLE Coordination: WNL LLE Deficits / Details: hip flexion 4+/5, knee extension 5/5, hip add/abd 5/5 LLE Sensation: WNL LLE Coordination: WNL (heel to shin without dyametria)       Communication   Communication: No difficulties  Cognition Arousal/Alertness: Awake/alert Behavior During Therapy: WFL for tasks assessed/performed Overall Cognitive Status: Within Functional Limits for tasks assessed                                 General Comments: patient is alert, oriented x 4, able to follow all directions without difficulty        General Comments      Exercises     Assessment/Plan    PT Assessment Patient does not need any further PT services  PT Problem List         PT Treatment Interventions      PT Goals (Current goals can be found in the Care  Plan section)  Acute Rehab PT Goals Patient Stated Goal: to return home PT Goal Formulation: All assessment and education complete, DC therapy Time For Goal Achievement: 03/22/21 Potential to Achieve Goals: Good    Frequency     Barriers to discharge        Co-evaluation               AM-PAC PT "6 Clicks" Mobility  Outcome Measure Help needed turning from your back to your side while in a flat bed without using bedrails?: None Help needed moving from lying on your back to sitting on the side of a flat bed without using bedrails?: None Help needed moving to and from a bed to a chair (including a wheelchair)?: None Help needed standing up from a chair using your arms (e.g., wheelchair or bedside chair)?: None Help needed to walk in hospital room?: None Help needed climbing 3-5 steps with a railing? : None 6 Click Score: 24    End of Session   Activity Tolerance: Patient tolerated treatment well Patient left: in bed;with call bell/phone within reach Nurse Communication: Mobility status PT Visit Diagnosis: Other symptoms  and signs involving the nervous system (R29.898)    Time: 4129-0475 PT Time Calculation (min) (ACUTE ONLY): 25 min   Charges:   PT Evaluation $PT Eval Low Complexity: 1 Low PT Treatments $Neuromuscular Re-education: 8-22 mins       Minna Merritts, PT, MPT   Percell Locus 03/22/2021, 8:59 AM

## 2021-03-23 ENCOUNTER — Telehealth: Payer: Self-pay

## 2021-03-23 NOTE — Telephone Encounter (Signed)
Transition Care Management Unsuccessful Follow-up Telephone Call  Date of discharge and from where:  03/22/21-[facility]  Attempts:  1st Attempt  Reason for unsuccessful TCM follow-up call:  Unable to reach patient. HFU schedules 04/06/21. Will follow.

## 2021-03-24 ENCOUNTER — Ambulatory Visit: Payer: Medicare HMO | Admitting: Pharmacist

## 2021-03-24 DIAGNOSIS — I10 Essential (primary) hypertension: Secondary | ICD-10-CM

## 2021-03-24 DIAGNOSIS — E1169 Type 2 diabetes mellitus with other specified complication: Secondary | ICD-10-CM

## 2021-03-24 DIAGNOSIS — E119 Type 2 diabetes mellitus without complications: Secondary | ICD-10-CM

## 2021-03-24 NOTE — Chronic Care Management (AMB) (Signed)
Chronic Care Management CCM Pharmacy Note  03/24/2021 Name:  Katherine Jimenez MRN:  [id number] DOB:  1951/03/28  Summary: - Confirmed updated medications. Has supply of Ozempic.   Recommendations/Changes made from today's visit: - Continue current regimen at this time - Will reapply for 2023 Ozempic patient assistance for 2023.   Subjective: Katherine Jimenez is an 70 y.o. year old female who is a primary patient of Tullo, Aris Everts, MD.  The CCM team was consulted for assistance with disease management and care coordination needs.    Engaged with patient by telephone for follow up visit for pharmacy case management and/or care coordination services.   Objective:  Medications Reviewed Today     Reviewed by De Hollingshead, RPH-CPP (Pharmacist) on 03/24/21 at 1450  Med List Status: <None>   Medication Order Taking? Sig Documenting Provider Last Dose Status Informant  acetaminophen (TYLENOL) 500 MG tablet [id number]  Take 1,000 mg by mouth every 6 (six) hours as needed (FOR PAIN.). [provider]  Active Self  aspirin EC 81 MG tablet [id number]  Take 81 mg by mouth daily. Swallow whole. [provider]  Active   atorvastatin (LIPITOR) 80 MG tablet [id number]  Take 1 tablet (80 mg total) by mouth daily. Lorella Nimrod, MD  Active   Biotin 5 MG CAPS [id number]  Take 2 capsules by mouth 2 (two) times daily.  [provider]  Active Self  blood glucose meter kit and supplies [id number]  Dispense Contour next bayer meter. E11.9 To check blood glucose  Once a day. Crecencio Mc, MD  Active Self  buPROPion (WELLBUTRIN XL) 300 MG 24 hr tablet [id number]  TAKE ONE TABLET BY MOUTH DAILY Crecencio Mc, MD  Active   Cholecalciferol (VITAMIN D3) 1000 units CAPS [id number]  Take 1,000 Units by mouth daily.  [provider]  Active Self  clopidogrel (PLAVIX) 75 MG tablet [id number]  Take 1 tablet (75 mg total) by mouth daily for 21 days. Lorella Nimrod, MD  Active    cyanocobalamin (,VITAMIN B-12,) 1000 MCG/ML injection [id number]  Inject 1 mL (1,000 mcg total) into the muscle every 30 (thirty) days. Crecencio Mc, MD  Active   diphenhydramine-acetaminophen (TYLENOL PM) 25-500 MG TABS tablet [id number]  Take 1 tablet by mouth at bedtime as needed. [provider]  Active   donepezil (ARICEPT) 5 MG tablet [id number]  Take 5 mg by mouth daily. [provider]  Active   DULoxetine (CYMBALTA) 30 MG capsule [id number]  TAKE ONE CAPSULE BY MOUTH DAILY Crecencio Mc, MD  Active   glucose blood (FREESTYLE LITE) test strip [id number]  Use once daily  as instructed to test blood sugar E 11.9 Crecencio Mc, MD  Active Self  lamoTRIgine (LAMICTAL) 200 MG tablet [id number]  TAKE ONE TABLET BY MOUTH TWICE A DAY Crecencio Mc, MD  Active   Lancets (FREESTYLE) lancets [id number]  Use once daily to check blood sugars  E11.9 Crecencio Mc, MD  Active Self  losartan (COZAAR) 25 MG tablet [id number]  TAKE TWO TABLETS BY MOUTH DAILY Crecencio Mc, MD  Active   meloxicam (MOBIC) 15 MG tablet [id number]  Take 1 tablet (15 mg total) by mouth daily. Crecencio Mc, MD  Active   metFORMIN (GLUCOPHAGE-XR) 750 MG 24 hr tablet [id number] Yes TAKE ONE TABLET BY MOUTH DAILY WITH BREAKFAST Crecencio Mc, MD Taking Active   omeprazole (PRILOSEC) 40 MG capsule [id number]  TAKE ONE CAPSULE BY MOUTH  DAILY Crecencio Mc, MD  Active   Semaglutide,0.25 or 0.5MG/DOS, (OZEMPIC, 0.25 OR 0.5 MG/DOSE,) 2 MG/1.5ML SOPN [id number] Yes Inject 0.5 mg into the skin once a week. Inject 0.25 mg weekly for 4 weeks then increase to 0.5 mg weekly Crecencio Mc, MD Taking Active   Syringe, Disposable, 1 ML MISC [id number]  For use with B12 injections Crecencio Mc, MD  Active Self  temazepam (RESTORIL) 22.5 MG capsule [id number]  Take 1 capsule (22.5 mg total) by mouth at bedtime as needed for sleep. Crecencio Mc, MD  Active   tiZANidine (ZANAFLEX) 2 MG tablet [id number]  Take 1 tablet (2  mg total) by mouth in the morning and at bedtime.  Patient taking differently: Take 2 mg by mouth as needed.   Crecencio Mc, MD  Active   traZODone (DESYREL) 100 MG tablet [id number]  One tablet by mouth one hour before bedtime Crecencio Mc, MD  Active   vitamin E 1000 UNIT capsule [id number]  Take 1,000 Units by mouth daily. [provider]  Active Self            Pertinent Labs:   Lab Results  Component Value Date   HGBA1C 6.0 (H) 03/22/2021   Lab Results  Component Value Date   CHOL 203 (H) 03/22/2021   HDL 94 03/22/2021   LDLCALC 82 03/22/2021   LDLDIRECT 112.0 10/13/2016   TRIG 135 03/22/2021   CHOLHDL 2.2 03/22/2021   Lab Results  Component Value Date   CREATININE 0.79 03/21/2021   BUN 13 03/21/2021   NA 135 03/21/2021   K 4.0 03/21/2021   CL 99 03/21/2021   CO2 27 03/21/2021    SDOH:  (Social Determinants of Health) assessments and interventions performed:  SDOH Interventions    Flowsheet Row Most Recent Value  SDOH Interventions   Financial Strain Interventions Other (Comment)  [manufacturer assistance]       CCM Care Plan  Review of patient past medical history, allergies, medications, health status, including review of consultants reports, laboratory and other test data, was performed as part of comprehensive evaluation and provision of chronic care management services.   Care Plan : Medication Management  Updates made by De Hollingshead, RPH-CPP since 03/24/2021 12:00 AM     Problem: Diabetes, Hyperlipidemia, Hypertension      Long-Range Goal: Disease Progression Prevention   Start Date: 01/06/2021  Recent Progress: On track  Priority: High  Note:   Current Barriers:  Unable to independently afford treatment regimen Unable to achieve control of diabetes    Pharmacist Clinical Goal(s):  patient will verbalize ability to afford treatment regimen through collaboration with PharmD and provider.    Interventions: 1:1  collaboration with Crecencio Mc, MD regarding development and update of comprehensive plan of care as evidenced by provider attestation and co-signature Inter-disciplinary care team collaboration (see longitudinal plan of care) Comprehensive medication review performed; medication list updated in electronic medical record  Health Maintenance   Yearly diabetic eye exam: up to date Yearly diabetic foot exam: up to date Urine microalbumin: up to date Yearly influenza vaccination: up to date Td/Tdap vaccination: up to date Pneumonia vaccination: due - recommend to discuss moving forward COVID vaccinations: due - recommend bivalent booster 3+ months from positive test  Shingrix vaccinations: due - will discuss moving forward.  Colonoscopy: up to date Bone density scan: up to date Mammogram: up to date   Diabetes:  Controlled per last A1c; current treatment: metformin  XR 750 mg daily, Ozempic 0.5 mg weekly- has been off for ~2 weeks  Current glucose readings: reports hospital readings were ~ 110s Approved for Ozempic assistance through [employer] through end of this year, needs to reapply.  Advised she could restart Ozempic at 0.25 mg weekly for 2 weeks, then increase to 0.5 mg weekly if she would like.  Advised to check readings ~ once daily, alternating between fasting and 2 hour post prandial  Will collaborate w/ CPhT, patient, and providers to reapply for patient assistance for Ozempic for 2023 at our next viist.   Hypertension:   Moderately well controlled; current treatment: losartan 50 mg daily Previously recommended to continue current regimen at this time  Hyperlipidemia, secondary ASCVD prevention Moderately well controlled; current treatment: atorvastatin 80 mg - increased s/p new CVA Antiplatelet regimen: aspirin 81 mg daily Recommended to continue current regimen at this time. Follow lipids moving forward.   Depression/Anxiety, possible cognitive impairment   Current  treatment: bupropion XL 300 mg daily, duloxetine 30 mg daily, lamotrigine 200 mg BID Cognitive impairment per neurology:  Previously prescribed temazepam, trazodone with no benefit Previously recommended to continue current regimen at this time along with collaboration with neurology  Osteoarthritis: Uncontrolled, Current regimen: starting meloxicam 15 mg daily, tizanidine 2 mg BID PRN  Previously recommended to continue current regimen at this time  GERD: Moderately well controlled; current regimen: omeprazole 40 mg daily  Previously recommended to continue current regimen at this time  Supplements: Vitamin D, biotin, Vitamin E   Patient Goals/Self-Care Activities patient will:  - take medications as prescribed check glucose daily, document, and provide at future appointments collaborate with provider on medication access solutions       Plan: Telephone follow up appointment with care management team member scheduled for:  6 weeks  Catie Darnelle Maffucci, PharmD, [place], [place] Clinical Pharmacist [employer] at [employer] [phone]

## 2021-03-24 NOTE — Telephone Encounter (Signed)
Transition Care Management Follow-up Telephone Call Date of discharge and from where: 03/22/21-[facility] How have you been since you were released from the hospital? Patient states,"I don't feel great and I am not sleeping well, I think that is because I did not rest well in the hospital. A little fatigue and shortness of breath on exertion.I have a cough every once in awhile that is not deep. I do not want to go back to ED." Denies N/V/D, fever, numbness, chest pain, headache, leg cramping, slurred speech and all stroke symptoms. Input/output appropriate. No acute symptoms. Any questions or concerns? Yes. I am concerned with repeated testing positive. I plan to quarantine and retest. When is it okay to test for Covid again since I had it about 3 weeks ago and still tested positive a couple of days ago?  Items Reviewed: Did the pt receive and understand the discharge instructions provided? Yes  Medications obtained and verified? Yes  Any new allergies since your discharge? No  Dietary orders reviewed? Yes Do you have support at home? Yes   Home Care and Equipment/Supplies: Were home health services ordered? No  Functional Questionnaire: (I = Independent and D = Dependent) ADLs: I  Bathing/Dressing- I  Meal Prep- I  Eating- I  Maintaining continence- I  Transferring/Ambulation- I  Managing Meds- I  Follow up appointments reviewed:  PCP Hospital f/u appt confirmed? Yes  Scheduled to see PCP on 04/06/21 @ 11:00. Would like to come sooner if any availability. Otherwise will keep scheduled appointment.  [facility] f/u appt confirmed? Yes  Scheduled to see Neurologist on 04/05/21.  Are transportation arrangements needed? No  If their condition worsens, is the pt aware to call PCP or go to the Emergency Dept.? Yes Was the patient provided with contact information for the PCP's office or ED? Yes Was to pt encouraged to call back with questions or concerns? Yes

## 2021-03-24 NOTE — Patient Instructions (Signed)
Visit Information  Following are the goals we discussed today:  Patient Goals/Self-Care Activities patient will:  - take medications as prescribed check glucose daily, document, and provide at future appointments collaborate with provider on medication access solutions          Plan: Telephone follow up appointment with care management team member scheduled for:  6 weeks   Catie Darnelle Maffucci, PharmD, [place], CPP Clinical Pharmacist [place] at [facility] [phone]   Please call the care guide team at [phone] if you need to cancel or reschedule your appointment.   Patient verbalizes understanding of instructions provided today and agrees to view in [place].

## 2021-03-29 DIAGNOSIS — Z20822 Contact with and (suspected) exposure to covid-19: Secondary | ICD-10-CM | POA: Diagnosis not present

## 2021-03-29 DIAGNOSIS — Z03818 Encounter for observation for suspected exposure to other biological agents ruled out: Secondary | ICD-10-CM | POA: Diagnosis not present

## 2021-03-31 ENCOUNTER — Other Ambulatory Visit: Payer: Self-pay | Admitting: Internal Medicine

## 2021-04-01 NOTE — Telephone Encounter (Signed)
Refilled: 01/06/2021 Last OV: 03/21/2021 Next OV: 04/06/2021

## 2021-04-05 DIAGNOSIS — Z8673 Personal history of transient ischemic attack (TIA), and cerebral infarction without residual deficits: Secondary | ICD-10-CM | POA: Diagnosis not present

## 2021-04-05 DIAGNOSIS — R0683 Snoring: Secondary | ICD-10-CM | POA: Diagnosis not present

## 2021-04-05 DIAGNOSIS — R9082 White matter disease, unspecified: Secondary | ICD-10-CM | POA: Diagnosis not present

## 2021-04-05 DIAGNOSIS — G3184 Mild cognitive impairment, so stated: Secondary | ICD-10-CM | POA: Diagnosis not present

## 2021-04-05 DIAGNOSIS — G4710 Hypersomnia, unspecified: Secondary | ICD-10-CM | POA: Diagnosis not present

## 2021-04-06 ENCOUNTER — Other Ambulatory Visit: Payer: Self-pay | Admitting: Internal Medicine

## 2021-04-06 ENCOUNTER — Ambulatory Visit (INDEPENDENT_AMBULATORY_CARE_PROVIDER_SITE_OTHER): Payer: Medicare HMO | Admitting: Internal Medicine

## 2021-04-06 ENCOUNTER — Telehealth: Payer: Self-pay | Admitting: Internal Medicine

## 2021-04-06 ENCOUNTER — Other Ambulatory Visit: Payer: Self-pay

## 2021-04-06 ENCOUNTER — Ambulatory Visit
Admission: RE | Admit: 2021-04-06 | Discharge: 2021-04-06 | Disposition: A | Discharge status: Home / Self Care | Payer: Medicare HMO | Source: Ambulatory Visit | Attending: Internal Medicine | Admitting: Internal Medicine

## 2021-04-06 ENCOUNTER — Encounter: Payer: Self-pay | Admitting: Internal Medicine

## 2021-04-06 ENCOUNTER — Telehealth: Payer: Self-pay

## 2021-04-06 VITALS — BP 142/88 | HR 92 | Temp 98.2°F | Ht 60.0 in | Wt 148.2 lb

## 2021-04-06 DIAGNOSIS — D509 Iron deficiency anemia, unspecified: Secondary | ICD-10-CM

## 2021-04-06 DIAGNOSIS — E785 Hyperlipidemia, unspecified: Secondary | ICD-10-CM

## 2021-04-06 DIAGNOSIS — E1169 Type 2 diabetes mellitus with other specified complication: Secondary | ICD-10-CM

## 2021-04-06 DIAGNOSIS — M79652 Pain in left thigh: Secondary | ICD-10-CM

## 2021-04-06 DIAGNOSIS — Z8673 Personal history of transient ischemic attack (TIA), and cerebral infarction without residual deficits: Secondary | ICD-10-CM

## 2021-04-06 DIAGNOSIS — I10 Essential (primary) hypertension: Secondary | ICD-10-CM | POA: Diagnosis not present

## 2021-04-06 DIAGNOSIS — Z09 Encounter for follow-up examination after completed treatment for conditions other than malignant neoplasm: Secondary | ICD-10-CM | POA: Diagnosis not present

## 2021-04-06 LAB — D-DIMER, QUANTITATIVE: D-DIMER: 0.36 mg/L FEU (ref 0.00–0.49)

## 2021-04-06 MED ORDER — NITROGLYCERIN 0.4 MG SL SUBL: 0.4000 mg | SUBLINGUAL_TABLET | SUBLINGUAL | 3 refills | Status: DC | PRN

## 2021-04-06 NOTE — Assessment & Plan Note (Addendum)
Incidental finding during workup for MCI.  Recent COVID infection remains in the ddx as causative . Agitated saline study was negative , carotid ultrasounds were not done and have been ordered today.  Continue plavix for 3 weeks,  Asa long term,  Increased statin dose .  If DVT is ruled in ,  Will need change in anti platelet therapy to warfarin

## 2021-04-06 NOTE — Assessment & Plan Note (Signed)
Reminded again to start iron supplementation.  hgb was normal last month.   Lab Results  Component Value Date   IRON 24 (L) 03/02/2021   TIBC 459 (H) 03/02/2021   FERRITIN 5.6 (L) 01/06/2021

## 2021-04-06 NOTE — Progress Notes (Addendum)
Patient came back into office after going for [place] to rule out DVT with complaint of chest pressure and fullness, attained Vitals BP 140/78 pule 79 )2 sats @ 98% on room air, ECG performed interrupted  by  PCP as normal for patient, patient was advised by PCP that she would send nitro for Angina and to FU with cardiology. Patient was also advised if symptoms worsen or new onset chest pain needs to be evaluated. Patient agreed and voiced understanding.

## 2021-04-06 NOTE — Telephone Encounter (Signed)
Patient called in requesting to speak with Janett Billow about results . Please call patient @ [phone]

## 2021-04-06 NOTE — Patient Instructions (Signed)
You have VERY LOW IRON .  Over time this will cause  anemia.  Please start taking an iron supplement once daily with orange juice or a meal.  Return for iron and cholesterol labs in early February \  I want to rule out DVT left thigh  ASAP with a  D Dimer and ultrasound TODAY  If your home bp readings are now down to 130/80  by the end of the week,  increase the losartan to 50 mg daily

## 2021-04-06 NOTE — Assessment & Plan Note (Signed)
Home readings have been elevated and attributed to anxiety by patient due  to the untimely death a close friend who also had been diagnosed with dementia and treated. Advised to increase losartan dose to 50 mg on Saturday if B is not 130/80 or less by home readings.

## 2021-04-06 NOTE — Assessment & Plan Note (Addendum)
Patient is stable post discharge and has no new issues but several unanswered  questions about discharge plans at the visit today for hospital follow up.  I have reviewed the records from the hospital admission in detail with patient today. Carotid ultrasounds ordered.  Left leg [place] ordered as well . Follow up labs in 6 weeks ordered

## 2021-04-06 NOTE — Assessment & Plan Note (Signed)
Rule out DVT given recent COVID infection and recent CVA

## 2021-04-06 NOTE — Progress Notes (Signed)
Subjective:  Patient ID: Katherine Jimenez, female    DOB: 1950-07-26  Age: 71 y.o. MRN: [id number]  CC: The primary encounter diagnosis was Pain of left thigh. Diagnoses of Primary hypertension, Iron deficiency anemia, unspecified iron deficiency anemia type, Hyperlipidemia associated with type 2 diabetes mellitus ([place]), History of CVA (cerebrovascular accident) without residual deficits, and Hospital discharge follow-up were also pertinent to this visit.  HPI Katherine Jimenez presents for hospital follow up Chief Complaint  Patient presents with   Hospitalization Follow-up    Pt was hospitalized for a CVA   This visit occurred during the SARS-CoV-2 public health emergency.  Safety protocols were in place, including screening questions prior to the visit, additional usage of staff PPE, and extensive cleaning of exam room while observing appropriate contact time as indicated for disinfecting solutions.   Katherine Jimenez was admitted to [facility] on Dec 19 after a planned brain MRI ordered by her neurologist was positive for an acute CVA  on Dec 15 . Patient was asymptomatic ; she was sent for routine MRI by neurologist for workup of cognitive decline,  and had been infected with COVID on Nov 30.  She was contacted by Manuella Ghazi to go to ER for expedited workup of acute CVA , but left without being seen .after waiting for 7 hours to be seen.Katherine Jimenez on Dec  19 , and reported 4-5 days of severe left upper leg pain .  [facility] sent her back to ER for admission and accelerated workup. And for rule out left leg DVT. Katherine Jimenez  MRI noted a 7 mm acute infarct in the right external capsule. She was admitted , underwent ECHO with bubble study,  PT evaluation and discharged on dec 20 with 3 week rx for Plavix and continued asa.  Lipitor dose increased from 1 mg to 80 mg .  U/s of left leg was  not done.  UA was abnormal , pyuria (>50 wbcs, scan bacteria) and culture was positive for multiple species.  She was asymptomatic  so no treatment was advised other than the ceftriaxone she received in the ER   History of sudden onset of severe left thigh pain on Tuesday dec 15 :  she does not recall any  redness or swelling,  says it felt like a muscle cramp, but was persistent for 4-5 days before resolving spontaneously prior to the MRI brain .      Outpatient Medications Prior to Visit  Medication Sig Dispense Refill   acetaminophen (TYLENOL) 500 MG tablet Take 1,000 mg by mouth every 6 (six) hours as needed (FOR PAIN.).     aspirin EC 81 MG tablet Take 81 mg by mouth daily. Swallow whole.     atorvastatin (LIPITOR) 80 MG tablet Take 1 tablet (80 mg total) by mouth daily. 30 tablet 2   Biotin 5 MG CAPS Take 2 capsules by mouth 2 (two) times daily.      blood glucose meter kit and supplies Dispense Contour next bayer meter. E11.9 To check blood glucose  Once a day. 1 each 0   buPROPion (WELLBUTRIN XL) 300 MG 24 hr tablet TAKE ONE TABLET BY MOUTH DAILY 30 tablet 1   Cholecalciferol (VITAMIN D3) 1000 units CAPS Take 1,000 Units by mouth daily.      clopidogrel (PLAVIX) 75 MG tablet Take 1 tablet (75 mg total) by mouth daily for 21 days. 21 tablet 0   cyanocobalamin (,VITAMIN B-12,) 1000 MCG/ML injection Inject 1  1,000 mcg total) into the muscle every 30 (thirty) days. 5 mL 8  ° diphenhydramine-acetaminophen (TYLENOL PM) 25-500 MG TABS tablet Take 1 tablet by mouth at bedtime as needed.    ° donepezil (ARICEPT) 5 MG tablet Take 5 mg by mouth daily.    ° DULoxetine (CYMBALTA) 30 MG capsule TAKE ONE CAPSULE BY MOUTH DAILY 90 capsule 0  ° glucose blood (FREESTYLE LITE) test strip Use once daily  as instructed to test blood sugar E 11.9 100 each 3  ° lamoTRIgine (LAMICTAL) 200 MG tablet TAKE ONE TABLET BY MOUTH TWICE A DAY 180 tablet 1  ° Lancets (FREESTYLE) lancets Use once daily to check blood sugars  E11.9 100 each 3  ° losartan (COZAAR) 25 MG tablet TAKE TWO TABLETS BY MOUTH DAILY 180 tablet 1  ° metFORMIN (GLUCOPHAGE-XR) 750  MG 24 hr tablet TAKE ONE TABLET BY MOUTH DAILY WITH BREAKFAST 90 tablet 1  ° omeprazole (PRILOSEC) 40 MG capsule TAKE ONE CAPSULE BY MOUTH DAILY 90 capsule 3  ° Semaglutide,0.25 or 0.5MG/DOS, (OZEMPIC, 0.25 OR 0.5 MG/DOSE,) 2 MG/1.5ML SOPN Inject 0.5 mg into the skin once a week. Inject 0.25 mg weekly for 4 weeks then increase to 0.5 mg weekly 1.5 mL 0  ° Syringe, Disposable, 1 ML MISC For use with B12 injections 25 each 3  ° temazepam (RESTORIL) 22.5 MG capsule TAKE ONE CAPSULE BY MOUTH EVERY NIGHT AT BEDTIME AS NEEDED FOR SLEEP 30 capsule 3  ° vitamin E 1000 UNIT capsule Take 1,000 Units by mouth daily.    ° meloxicam (MOBIC) 15 MG tablet Take 1 tablet (15 mg total) by mouth daily. (Patient not taking: Reported on 04/06/2021) 30 tablet 0  ° tiZANidine (ZANAFLEX) 2 MG tablet Take 1 tablet (2 mg total) by mouth in the morning and at bedtime. (Patient not taking: Reported on 04/06/2021) 30 tablet 1  ° traZODone (DESYREL) 100 MG tablet One tablet by mouth one hour before bedtime (Patient not taking: Reported on 04/06/2021) 90 tablet 0  ° °No facility-administered medications prior to visit.  ° ° °Review of Systems; ° °Patient denies headache, fevers, malaise, unintentional weight loss, skin rash, eye pain, sinus congestion and sinus pain, sore throat, dysphagia,  hemoptysis , cough, dyspnea, wheezing, chest pain, palpitations, orthopnea, edema, abdominal pain, nausea, melena, diarrhea, constipation, flank pain, dysuria, hematuria, urinary  Frequency, nocturia, numbness, tingling, seizures,  Focal weakness, Loss of consciousness,  Tremor, insomnia, depression, anxiety, and suicidal ideation.   ° ° ° °Objective:  °BP (!) 142/88 (BP Location: Left Arm, Patient Position: Sitting, Cuff Size: Normal)    Pulse 92    Temp 98.2 °F (36.8 °C) (Oral)    Ht 5' (1.524 m)    Wt 148 lb 3.2 oz (67.2 kg)    SpO2 97%    BMI 28.94 kg/m²  ° °BP Readings from Last 3 Encounters:  °04/06/21 (!) 142/88  °03/22/21 132/77  °03/21/21 134/80  ° ° °Wt  Readings from Last 3 Encounters:  °04/06/21 148 lb 3.2 oz (67.2 kg)  °03/21/21 149 lb 14.6 oz (68 kg)  °03/21/21 148 lb 9.6 oz (67.4 kg)  ° ° °General appearance: alert, cooperative and appears stated age °Ears: normal TM's and external ear canals both ears °Throat: lips, mucosa, and tongue normal; teeth and gums normal °Neck: no adenopathy, no carotid bruit, supple, symmetrical, trachea midline and thyroid not enlarged, symmetric, no tenderness/mass/nodules °Back: symmetric, no curvature. ROM normal. No CVA tenderness. °Lungs: clear to auscultation bilaterally °Heart: regular rate   and rhythm, S1, S2 normal, no murmur, click, rub or gallop °Abdomen: soft, non-tender; bowel sounds normal; no masses,  no organomegaly °Pulses: 2+ and symmetric °Skin: Skin color, texture, turgor normal. No rashes or lesions °Lymph nodes: Cervical, supraclavicular, and axillary nodes normal. °Neuro:  awake and interactive with normal mood and affect. Higher cortical functions are normal. Speech is clear without word-finding difficulty or dysarthria. Extraocular movements are intact. Visual fields of both eyes are grossly intact. Sensation to light touch is grossly intact bilaterally of upper and lower extremities. Motor examination shows 4+/5 symmetric hand grip and upper extremity and 5/5 lower extremity strength. There is no pronation or drift. Gait is non-ataxic  ° °Lab Results  °Component Value Date  ° HGBA1C 6.0 (H) 03/22/2021  ° HGBA1C 5.9 (H) 03/21/2021  ° HGBA1C 6.6 (H) 01/06/2021  ° ° °Lab Results  °Component Value Date  ° CREATININE 0.79 03/21/2021  ° CREATININE 0.67 03/18/2021  ° CREATININE 0.79 03/02/2021  ° ° °Lab Results  °Component Value Date  ° WBC 8.5 03/21/2021  ° HGB 11.7 (L) 03/21/2021  ° HCT 38.2 03/21/2021  ° PLT 372 03/21/2021  ° GLUCOSE 117 (H) 03/21/2021  ° CHOL 203 (H) 03/22/2021  ° TRIG 135 03/22/2021  ° HDL 94 03/22/2021  ° LDLDIRECT 112.0 10/13/2016  ° LDLCALC 82 03/22/2021  ° ALT 23 03/21/2021  ° AST 20  03/21/2021  ° NA 135 03/21/2021  ° K 4.0 03/21/2021  ° CL 99 03/21/2021  ° CREATININE 0.79 03/21/2021  ° BUN 13 03/21/2021  ° CO2 27 03/21/2021  ° TSH 0.93 04/16/2020  ° INR 1.0 03/21/2021  ° HGBA1C 6.0 (H) 03/22/2021  ° MICROALBUR 2.0 04/16/2020  ° ° °ECHOCARDIOGRAM COMPLETE BUBBLE STUDY ° °Result Date: 03/22/2021 °   ECHOCARDIOGRAM REPORT   Patient Name:   Julene P Mcquown Date of Exam: 03/22/2021 Medical Rec #:  [id number]       Height:       60.0 in Accession #:    [id number]      Weight:       149.9 lb Date of Birth:  12/04/1950      BSA:          1.651 m² Patient Age:    70 years        BP:           116/71 mmHg Patient Gender: F               HR:           87 bpm. Exam Location:  [facility] Procedure: 2D Echo, Color Doppler, Cardiac Doppler and Saline Contrast Bubble            Study Indications:     I63.9 Stroke  History:         Patient has no prior history of Echocardiogram examinations.                  Risk Factors:Hypertension and Diabetes.  Sonographer:     Joan Heiss Referring Phys:  [id number] SUMAYYA AMIN Diagnosing Phys: Muhammad Arida MD IMPRESSIONS  1. Left ventricular ejection fraction, by estimation, is 60 to 65%. The left ventricle has normal function. The left ventricle has no regional wall motion abnormalities. Left ventricular diastolic parameters are consistent with Grade I diastolic dysfunction (impaired relaxation).  2. Right ventricular systolic function is normal. The right ventricular size is normal. Tricuspid regurgitation signal is inadequate for assessing PA pressure.  3. The mitral valve is   is normal in structure. No evidence of mitral valve regurgitation. No evidence of mitral stenosis.  4. The aortic valve is normal in structure. Aortic valve regurgitation is not visualized. No aortic stenosis is present.  5. Agitated saline contrast bubble study was negative, with no evidence of any interatrial shunt. FINDINGS  Left Ventricle: Left ventricular ejection fraction, by estimation, is 60 to 65%.  The left ventricle has normal function. The left ventricle has no regional wall motion abnormalities. The left ventricular internal cavity size was normal in size. There is  no left ventricular hypertrophy. Left ventricular diastolic parameters are consistent with Grade I diastolic dysfunction (impaired relaxation). Right Ventricle: The right ventricular size is normal. No increase in right ventricular wall thickness. Right ventricular systolic function is normal. Tricuspid regurgitation signal is inadequate for assessing PA pressure. Left Atrium: Left atrial size was normal in size. Right Atrium: Right atrial size was normal in size. Pericardium: There is no evidence of pericardial effusion. Mitral Valve: The mitral valve is normal in structure. No evidence of mitral valve regurgitation. No evidence of mitral valve stenosis. MV peak gradient, 3.9 mmHg. The mean mitral valve gradient is 2.0 mmHg. Tricuspid Valve: The tricuspid valve is normal in structure. Tricuspid valve regurgitation is not demonstrated. No evidence of tricuspid stenosis. Aortic Valve: The aortic valve is normal in structure. Aortic valve regurgitation is not visualized. No aortic stenosis is present. Aortic valve mean gradient measures 4.0 mmHg. Aortic valve peak gradient measures 6.6 mmHg. Aortic valve area, by VTI measures 2.42 cm. Pulmonic Valve: The pulmonic valve was normal in structure. Pulmonic valve regurgitation is trivial. No evidence of pulmonic stenosis. Aorta: The aortic root is normal in size and structure. Venous: The inferior vena cava was not well visualized. IAS/Shunts: No atrial level shunt detected by color flow Doppler. Agitated saline contrast was given intravenously to evaluate for intracardiac shunting. Agitated saline contrast bubble study was negative, with no evidence of any interatrial shunt.  LEFT VENTRICLE PLAX 2D LVIDd:         3.29 cm   Diastology LVIDs:         2.34 cm   LV e' medial:    6.42 cm/s LV PW:          0.95 cm   LV E/e' medial:  13.7 LV IVS:        0.95 cm   LV e' lateral:   8.59 cm/s LVOT diam:     2.20 cm   LV E/e' lateral: 10.2 LV SV:         63 LV SV Index:   38 LVOT Area:     3.80 cm  LEFT ATRIUM           Index LA diam:      3.50 cm 2.12 cm/m LA Vol (A2C): 23.3 ml 14.11 ml/m  AORTIC VALVE                    PULMONIC VALVE AV Area (Vmax):    2.87 cm     PV Vmax:          1.09 m/s AV Area (Vmean):   2.79 cm     PV Vmean:         78.800 cm/s AV Area (VTI):     2.42 cm     PV VTI:           0.216 m AV Vmax:           128.00 cm/s  PV Peak grad:     4.8 mmHg AV Vmean:          89.900 cm/s  PV Mean grad:     3.0 mmHg AV VTI:            0.259 m      PR End Diast Vel: 3.17 msec AV Peak Grad:      6.6 mmHg AV Mean Grad:      4.0 mmHg LVOT Vmax:         96.60 cm/s LVOT Vmean:        65.900 cm/s LVOT VTI:          0.165 m LVOT/AV VTI ratio: 0.64  AORTA Ao Root diam: 3.30 cm MITRAL VALVE MV Area (PHT): 4.74 cm²     SHUNTS MV Area VTI:   3.45 cm²     Systemic VTI:  0.16 m MV Peak grad:  3.9 mmHg     Systemic Diam: 2.20 cm MV Mean grad:  2.0 mmHg MV Vmax:       0.99 m/s MV Vmean:      63.1 cm/s MV Decel Time: 160 msec MV E velocity: 87.80 cm/s MV A velocity: 106.00 cm/s MV E/A ratio:  0.83 Muhammad Arida MD Electronically signed by Muhammad Arida MD Signature Date/Time: 03/22/2021/1:53:13 PM    Final   ° ° °Assessment & Plan:  ° °Problem List Items Addressed This Visit   ° ° Hypertension  °  Home readings have been elevated and attributed to anxiety by patient due  to the untimely death a close friend who also had been diagnosed with dementia and treated. Advised to increase losartan dose to 50 mg on Saturday if B is not 130/80 or less by home readings.  °  °  ° Relevant Orders  ° Comprehensive metabolic panel  ° Iron deficiency anemia  °  Reminded again to start iron supplementation.  hgb was normal last month.  ° °Lab Results  °Component Value Date  ° IRON 24 (L) 03/02/2021  ° TIBC 459 (H) 03/02/2021  ° FERRITIN  5.6 (L) 01/06/2021  ° ° °  °  ° Relevant Orders  ° Iron, TIBC and Ferritin Panel  ° Hyperlipidemia associated with type 2 diabetes mellitus (H[facility])  ° Relevant Orders  ° Lipid panel  ° History of CVA (cerebrovascular accident) without residual deficits  °  Incidental finding during workup for MCI.  Recent COVID infection remains in the ddx as causative . Agitated saline study was negative , carotid ultrasounds were not done and have been ordered today.  Continue plavix for 3 weeks,  Asa long term,  Increased statin dose .  If DVT is ruled in ,  Will need change in anti platelet therapy to warfarin  °  °  ° Relevant Orders  ° US Carotid Duplex Bilateral  ° Pain of left thigh - Primary  °  Rule out DVT given recent COVID infection and recent CVA  °  °  ° Relevant Orders  ° US Venous Img Lower Unilateral Left (DVT)  ° D-Dimer, Quantitative  ° Hospital discharge follow-up  °  Patient is stable post discharge and has no new issues but several unanswered  questions about discharge plans at the visit today for hospital follow up.  I have reviewed the records from the hospital admission in detail with patient today. Carotid ultrasounds ordered.  Left leg US ordered as well . Follow up labs in 6 weeks ordered  °  °  ° ° ° °  I provided  40 minutes of  face-to-face time during this encounter reviewing patient's current problems and past surgeries, labs and imaging studies, providing counseling on the above mentioned problems , and coordination  of care .   Follow-up: No follow-ups on file.   Crecencio Mc, MD

## 2021-04-06 NOTE — Telephone Encounter (Signed)
Katherine Jimenez in [place] called with STAT report of [place] Left Leg. US showed negative for DVT. Pt was instructed that she could leave.

## 2021-04-07 NOTE — Telephone Encounter (Signed)
Pt came back into the office yesterday evening and was given results.

## 2021-04-07 NOTE — Assessment & Plan Note (Addendum)
D dimer and LLE  ultrasound were negative

## 2021-04-22 DIAGNOSIS — Z961 Presence of intraocular lens: Secondary | ICD-10-CM | POA: Diagnosis not present

## 2021-04-26 DIAGNOSIS — R251 Tremor, unspecified: Secondary | ICD-10-CM | POA: Diagnosis not present

## 2021-04-26 DIAGNOSIS — G4700 Insomnia, unspecified: Secondary | ICD-10-CM | POA: Diagnosis not present

## 2021-04-26 DIAGNOSIS — G3184 Mild cognitive impairment, so stated: Secondary | ICD-10-CM | POA: Diagnosis not present

## 2021-04-26 DIAGNOSIS — R9082 White matter disease, unspecified: Secondary | ICD-10-CM | POA: Diagnosis not present

## 2021-04-26 DIAGNOSIS — R69 Illness, unspecified: Secondary | ICD-10-CM | POA: Diagnosis not present

## 2021-04-26 DIAGNOSIS — Z8673 Personal history of transient ischemic attack (TIA), and cerebral infarction without residual deficits: Secondary | ICD-10-CM | POA: Diagnosis not present

## 2021-04-28 ENCOUNTER — Ambulatory Visit
Admission: RE | Admit: 2021-04-28 | Discharge: 2021-04-28 | Disposition: A | Discharge status: Home / Self Care | Payer: Medicare HMO | Source: Ambulatory Visit | Attending: Internal Medicine | Admitting: Internal Medicine

## 2021-04-28 ENCOUNTER — Other Ambulatory Visit: Payer: Self-pay

## 2021-04-28 DIAGNOSIS — Z1231 Encounter for screening mammogram for malignant neoplasm of breast: Secondary | ICD-10-CM | POA: Diagnosis not present

## 2021-05-02 ENCOUNTER — Other Ambulatory Visit: Payer: Self-pay | Admitting: Internal Medicine

## 2021-05-03 DIAGNOSIS — Z03818 Encounter for observation for suspected exposure to other biological agents ruled out: Secondary | ICD-10-CM | POA: Diagnosis not present

## 2021-05-03 DIAGNOSIS — Z20822 Contact with and (suspected) exposure to covid-19: Secondary | ICD-10-CM | POA: Diagnosis not present

## 2021-05-04 ENCOUNTER — Telehealth: Payer: Self-pay | Admitting: Internal Medicine

## 2021-05-04 NOTE — Telephone Encounter (Signed)
Pt called in stating she maybe having side affects from medication (Semaglutide,0.25 or 0.5MG /DOS, (OZEMPIC, 0.25 OR 0.5 MG/DOSE,) 2 MG/1.5ML SOPN). Pt stated she would like to speak with you about the medication. Pt didn't provide any other details. Pt requesting callback

## 2021-05-06 NOTE — Telephone Encounter (Signed)
LMTCB

## 2021-05-12 ENCOUNTER — Other Ambulatory Visit: Payer: Self-pay

## 2021-05-12 ENCOUNTER — Other Ambulatory Visit (INDEPENDENT_AMBULATORY_CARE_PROVIDER_SITE_OTHER): Payer: Medicare HMO

## 2021-05-12 DIAGNOSIS — D509 Iron deficiency anemia, unspecified: Secondary | ICD-10-CM | POA: Diagnosis not present

## 2021-05-12 DIAGNOSIS — I10 Essential (primary) hypertension: Secondary | ICD-10-CM

## 2021-05-12 DIAGNOSIS — E785 Hyperlipidemia, unspecified: Secondary | ICD-10-CM | POA: Diagnosis not present

## 2021-05-12 DIAGNOSIS — E1169 Type 2 diabetes mellitus with other specified complication: Secondary | ICD-10-CM

## 2021-05-12 LAB — COMPREHENSIVE METABOLIC PANEL
ALT: 15 U/L (ref 0–35)
AST: 14 U/L (ref 0–37)
Albumin: 4.1 g/dL (ref 3.5–5.2)
Alkaline Phosphatase: 100 U/L (ref 39–117)
BUN: 12 mg/dL (ref 6–23)
CO2: 31 mEq/L (ref 19–32)
Calcium: 9.9 mg/dL (ref 8.4–10.5)
Chloride: 101 mEq/L (ref 96–112)
Creatinine, Ser: 0.91 mg/dL (ref 0.40–1.20)
GFR: 64.49 mL/min (ref >60.00)
Glucose, Bld: 124 mg/dL — ABNORMAL HIGH (ref 70–99)
Potassium: 4.3 mEq/L (ref 3.5–5.1)
Sodium: 139 mEq/L (ref 135–145)
Total Bilirubin: 0.4 mg/dL (ref 0.2–1.2)
Total Protein: 6.8 g/dL (ref 6.0–8.3)

## 2021-05-12 LAB — LIPID PANEL
Cholesterol: 149 mg/dL (ref 0–200)
HDL: 86.2 mg/dL (ref >39.00)
LDL Cholesterol: 39 mg/dL (ref 0–99)
NonHDL: 62.57
Total CHOL/HDL Ratio: 2
Triglycerides: 119 mg/dL (ref 0.0–149.0)
VLDL: 23.8 mg/dL (ref 0.0–40.0)

## 2021-05-13 ENCOUNTER — Encounter: Payer: Self-pay | Admitting: Cardiovascular Disease

## 2021-05-13 ENCOUNTER — Ambulatory Visit: Payer: Medicare HMO | Admitting: Cardiovascular Disease

## 2021-05-13 ENCOUNTER — Ambulatory Visit: Payer: Self-pay

## 2021-05-13 VITALS — BP 130/83 | HR 85 | Ht 60.0 in | Wt 148.0 lb

## 2021-05-13 DIAGNOSIS — I10 Essential (primary) hypertension: Secondary | ICD-10-CM | POA: Diagnosis not present

## 2021-05-13 DIAGNOSIS — E785 Hyperlipidemia, unspecified: Secondary | ICD-10-CM | POA: Diagnosis not present

## 2021-05-13 DIAGNOSIS — I208 Other forms of angina pectoris: Secondary | ICD-10-CM | POA: Diagnosis not present

## 2021-05-13 DIAGNOSIS — I639 Cerebral infarction, unspecified: Secondary | ICD-10-CM | POA: Diagnosis not present

## 2021-05-13 DIAGNOSIS — E1169 Type 2 diabetes mellitus with other specified complication: Secondary | ICD-10-CM

## 2021-05-13 DIAGNOSIS — I2089 Other forms of angina pectoris: Secondary | ICD-10-CM

## 2021-05-13 LAB — IRON,TIBC AND FERRITIN PANEL
%SAT: 12 % (calc) — ABNORMAL LOW (ref 16–45)
Ferritin: 18 ng/mL (ref 16–288)
Iron: 56 ug/dL (ref 45–160)
TIBC: 455 mcg/dL (calc) — ABNORMAL HIGH (ref 250–450)

## 2021-05-13 MED ORDER — METOPROLOL TARTRATE 100 MG PO TABS
100.0000 mg | ORAL_TABLET | Freq: Once | ORAL | 0 refills | Status: DC
Start: 2021-05-13 — End: 2021-05-30

## 2021-05-13 NOTE — Patient Instructions (Addendum)
Medication Instructions:  No changes  If you need a refill on your cardiac medications before your next appointment, please call your pharmacy.   Lab work: No new labs needed  Testing/Procedures: (we will call with your results)  Cardiac CTA (05/26/06:45 am) Zio (heart monitor, wear for 14 days)  Follow-Up: At [facility], you and your health needs are our priority.  As part of our continuing mission to provide you with exceptional heart care, we have created designated Provider Care Teams.  These Care Teams include your primary Cardiologist (physician) and Advanced Practice Providers (APPs -  Physician Assistants and Nurse Practitioners) who all work together to provide you with the care you need, when you need it.  You will need a follow up appointment as needed We will call with your results   Providers on your designated Care Team:   Murray Hodgkins, NP Christell Faith, PA-C Cadence Kathlen Mody, PA-C  COVID-19 Vaccine Information can be found at: [email or web address] For questions related to vaccine distribution or appointments, please email [email or web address] or call [phone].   Cardiac (coronary) CTA   [facility] [address] [place], [place] [address] [phone]  Date: 05/26/2021   Time: 08:45 am Please arrive 15 mins early for check-in and test prep.  Please follow these instructions carefully:    On the Night Before the Test: Be sure to Drink plenty of water. Do not consume any caffeinated/decaffeinated beverages or chocolate 12 hours prior to your test. This includes decaf as well  On the Day of the Test: Drink plenty of water until 1 hour prior to the test. Do not eat any food 4 hours prior to the test. You may take your regular medications prior to the test.  Take metoprolol (Lopressor) two hours prior to test. Lopressor 100 mg This was sent in to  your pharmacy for pick-up HOLD Furosemide/Hydrochlorothiazide morning of the test. No fluid pills FEMALES- please wear underwire-free bra if available      After the Test: Drink plenty of water. After receiving IV contrast, you may experience a mild flushed feeling. This is normal. On occasion, you may experience a mild rash up to 24 hours after the test. This is not dangerous. If this occurs, you can take Benadryl 25 mg and increase your fluid intake (to flush out the kidneys) If you experience trouble breathing, this can be serious. If it is severe call 911 IMMEDIATELY. If it is mild, please call our office. If you take any of these medications:  Glipizide/Metformin Avandament,  Glucavance,  please do not take 48 hours after completing test unless otherwise instructed.  For scheduling needs, including cancellations and rescheduling, please call [place], [phone].   Heart monitor (Zio patch) for 2 weeks (14 days)   Your physician has recommended that you wear a Zio monitor. This monitor is a medical device that records the hearts electrical activity. Doctors most often use these monitors to diagnose arrhythmias. Arrhythmias are problems with the speed or rhythm of the heartbeat. The monitor is a small device applied to your chest. You can wear one while you do your normal daily activities. While wearing this monitor if you have any symptoms to push the button and record what you felt. Once you have worn this monitor for the period of time provider prescribed (Usually 14 days), you will return the monitor device in the postage paid box. Once it is returned they will download the data collected and provide [place] with a report  which the provider will then review and we will call you with those results. Important tips:  Avoid showering during the first 24 hours of wearing the monitor. Avoid excessive sweating to help maximize wear time. Do not submerge the device, no hot tubs, and no  swimming pools. Keep any lotions or oils away from the patch. After 24 hours you may shower with the patch on. Take brief showers with your back facing the shower head.  Do not remove patch once it has been placed because that will interrupt data and decrease adhesive wear time. Push the button when you have any symptoms and write down what you were feeling. Once you have completed wearing your monitor, remove and place into box which has postage paid and place in your outgoing mailbox.  If for some reason you have misplaced your box then call our office and we can provide another box and/or mail it off for you.

## 2021-05-13 NOTE — Progress Notes (Signed)
Cardiology Office Note  Date:  05/13/2021   ID:  Camelle, Henkels 1950-04-26, MRN [id number]  PCP:  Crecencio Mc, MD   Cc: Stroke  HPI:  71 year old female with history of  anxiety, bipolar disorder, and hypertension,  episodes of chest pain in the past last Hyperlipidemia Who presents for new patient evaluation after recent stroke, hyperlipidemia  Discussed recent events, diagnosed with COVID-19 infection early December 2022 Secondary to memory loss seen by neurology, MRI on 12/15 /22 showing 7 mm acute infarct within the right external capsule. Sent to the emergency room for further evaluation  She reports symptoms resolved after several days  Lab work showing   Total cholesterol at 250, LDL 111 and triglyceride 130.   Echo reviewed  1. Left ventricular ejection fraction, by estimation, is 60 to 65%. The  left ventricle has normal function. The left ventricle has no regional  wall motion abnormalities. Left ventricular diastolic parameters are  consistent with Grade I diastolic  dysfunction (impaired relaxation).   2. Right ventricular systolic function is normal. The right ventricular  size is normal. Tricuspid regurgitation signal is inadequate for assessing  PA pressure.   3. The mitral valve is normal in structure. No evidence of mitral valve  regurgitation. No evidence of mitral stenosis.   4. The aortic valve is normal in structure. Aortic valve regurgitation is  not visualized. No aortic stenosis is present.   5. Agitated saline contrast bubble study was negative, with no evidence  of any interatrial shunt.   In follow-up reports he is active, does water aerobics Rare chest pain symptoms while in the swimming pool on exertion Concerned as symptoms seem to be getting worse  No event monitor as part of her stroke work-up  No severe carotid disease on CT  EKG personally reviewed by myself on todays visit Normal sinus rhythm rate 85 bpm no significant  ST-T wave changes  PMH:   has a past medical history of Arthritis, Bipolar disorder ([place]), Bunion, left, Depression, Diabetes mellitus, Hypertension, Lower back pain, Squamous cell skin cancer, face, and Treadmill stress test negative for angina pectoris (June 2013).  PSH:    Past Surgical History:  Procedure Laterality Date   BLADDER AUGMENTATION     BLADDER SURGERY     CARDIAC CATHETERIZATION  03/05/12   no stents   CATARACT EXTRACTION W/PHACO Right 07/19/2015   Procedure: CATARACT EXTRACTION PHACO AND INTRAOCULAR LENS PLACEMENT ([place]) right eye;  Surgeon: Ronnell Freshwater, MD;  Location: [facility];  Service: Ophthalmology;  Laterality: Right;  BORDERLINE DIABETIC - oral meds   CATARACT EXTRACTION W/PHACO Left 05/28/2017   Procedure: CATARACT EXTRACTION PHACO AND INTRAOCULAR LENS PLACEMENT ([place]) LEFT DIABETIC;  Surgeon: Leandrew Koyanagi, MD;  Location: [facility];  Service: Ophthalmology;  Laterality: Left;  Diabetic - oal meds   JOINT REPLACEMENT  2012   bilateral hip   KNEE ARTHROSCOPY     TOTAL HIP ARTHROPLASTY  2012   TOTAL KNEE ARTHROPLASTY Right 06/04/2017   Procedure: TOTAL KNEE ARTHROPLASTY;  Surgeon: Lovell Sheehan, MD;  Location: [facility];  Service: Orthopedics;  Laterality: Right;   TUBAL LIGATION     VAGINAL DELIVERY     2    Current Outpatient Medications  Medication Sig Dispense Refill   acetaminophen (TYLENOL) 500 MG tablet Take 1,000 mg by mouth every 6 (six) hours as needed (FOR PAIN.).     aspirin EC 81 MG tablet Take 81 mg by mouth daily. Swallow  whole.     atorvastatin (LIPITOR) 80 MG tablet Take 1 tablet (80 mg total) by mouth daily. 30 tablet 2   Biotin 5 MG CAPS Take 2 capsules by mouth 2 (two) times daily.      blood glucose meter kit and supplies Dispense Contour next bayer meter. E11.9 To check blood glucose  Once a day. 1 each 0   buPROPion (WELLBUTRIN XL) 300 MG 24 hr tablet TAKE ONE TABLET BY MOUTH DAILY 30 tablet 1    Cholecalciferol (VITAMIN D3) 1000 units CAPS Take 1,000 Units by mouth daily.      cyanocobalamin (,VITAMIN B-12,) 1000 MCG/ML injection INJECT 1 ML INTO THE MUSCLE EVERY 30 DAYS 3 mL 1   diphenhydramine-acetaminophen (TYLENOL PM) 25-500 MG TABS tablet Take 1 tablet by mouth at bedtime as needed.     donepezil (ARICEPT) 10 MG tablet Take 10 mg by mouth at bedtime.     DULoxetine (CYMBALTA) 30 MG capsule TAKE ONE CAPSULE BY MOUTH DAILY 90 capsule 0   glucose blood (FREESTYLE LITE) test strip Use once daily  as instructed to test blood sugar E 11.9 100 each 3   lamoTRIgine (LAMICTAL) 200 MG tablet TAKE ONE TABLET BY MOUTH TWICE A DAY 180 tablet 1   Lancets (FREESTYLE) lancets Use once daily to check blood sugars  E11.9 100 each 3   losartan (COZAAR) 25 MG tablet TAKE TWO TABLETS BY MOUTH DAILY 180 tablet 1   metoprolol tartrate (LOPRESSOR) 100 MG tablet Take 1 tablet (100 mg total) by mouth once for 1 dose. Take 2 hours before your Cardiac CTA procedure 1 tablet 0   nitroGLYCERIN (NITROSTAT) 0.4 MG SL tablet Place 1 tablet (0.4 mg total) under the tongue every 5 (five) minutes as needed for chest pain. 50 tablet 3   omeprazole (PRILOSEC) 40 MG capsule TAKE ONE CAPSULE BY MOUTH DAILY 90 capsule 3   Semaglutide,0.25 or 0.5MG/DOS, (OZEMPIC, 0.25 OR 0.5 MG/DOSE,) 2 MG/1.5ML SOPN Inject 0.5 mg into the skin once a week. Inject 0.25 mg weekly for 4 weeks then increase to 0.5 mg weekly 1.5 mL 0   Syringe, Disposable, 1 ML MISC For use with B12 injections 25 each 3   temazepam (RESTORIL) 22.5 MG capsule TAKE ONE CAPSULE BY MOUTH EVERY NIGHT AT BEDTIME AS NEEDED FOR SLEEP 30 capsule 3   No current facility-administered medications for this visit.     Allergies:   Thimerosal   Social History:  The patient  reports that she has never smoked. She has never used smokeless tobacco. She reports current alcohol use of about 1.0 standard drink per week. She reports that she does not use drugs.   Family History:    family history includes Breast cancer in her maternal aunt; Dementia in her father; Heart disease in her father, maternal grandfather, maternal grandmother, mother, paternal grandfather, and paternal grandmother; Stroke in her mother.    Review of Systems: Review of Systems  Constitutional: Negative.   HENT: Negative.    Respiratory: Negative.    Cardiovascular:  Positive for chest pain.  Gastrointestinal: Negative.   Musculoskeletal: Negative.   Neurological: Negative.   Psychiatric/Behavioral:  Positive for depression (.e5).   All other systems reviewed and are negative.   PHYSICAL EXAM: VS:  BP 130/83    Pulse 85    Ht 5' (1.524 m)    Wt 148 lb (67.1 kg)    SpO2 97%    BMI 28.90 kg/m  , BMI Body mass index is  28.9 kg/m. Constitutional:  oriented to person, place, and time. No distress.  HENT:  Head: Grossly normal Eyes:  no discharge. No scleral icterus.  Neck: No JVD, no carotid bruits  Cardiovascular: Regular rate and rhythm, no murmurs appreciated Pulmonary/Chest: Clear to auscultation bilaterally, no wheezes or rails Abdominal: Soft.  no distension.  no tenderness.  Musculoskeletal: Normal range of motion Neurological:  normal muscle tone. Coordination normal. No atrophy Skin: Skin warm and dry Psychiatric: normal affect, pleasant   Recent Labs 03/21/2021: Hemoglobin 11.7; Platelets 372 05/12/2021: ALT 15; BUN 12; Creatinine, Ser 0.91; Potassium 4.3; Sodium 139    Lipid Panel Lab Results  Component Value Date   CHOL 149 05/12/2021   HDL 86.20 05/12/2021   LDLCALC 39 05/12/2021   TRIG 119.0 05/12/2021      Wt Readings from Last 3 Encounters:  05/13/21 148 lb (67.1 kg)  04/06/21 148 lb 3.2 oz (67.2 kg)  03/21/21 149 lb 14.6 oz (68 kg)       ASSESSMENT AND PLAN:  Problem List Items Addressed This Visit       Cardiology Problems   Hypertension - Primary   Relevant Medications   metoprolol tartrate (LOPRESSOR) 100 MG tablet   Other Relevant Orders    EKG 12-Lead   CVA (cerebral vascular accident) ([place])   Relevant Medications   metoprolol tartrate (LOPRESSOR) 100 MG tablet   Other Relevant Orders   CT CORONARY MORPH W/CTA COR W/SCORE W/CA W/CM &/OR WO/CM   LONG TERM MONITOR (3-14 DAYS)   Hyperlipidemia associated with type 2 diabetes mellitus (HCC)   Relevant Medications   metoprolol tartrate (LOPRESSOR) 100 MG tablet     Other   Chest pain   Relevant Orders   CT CORONARY MORPH W/CTA COR W/SCORE W/CA W/CM &/OR WO/CM   Chest pain/angina Worsening symptoms, appreciates symptoms when doing water aerobics Risk factors including poorly controlled hyperlipidemia, diabetes type 2 Recent stroke Recommended cardiac CTA to rule out ischemia  History of stroke Recent work-up discussed with her, has had carotid CT, head MRI Echocardiogram reviewed We have ordered a ZIO monitor to rule out atrial fibrillation/flutter Currently on aspirin, not on Plavix  Hyperlipidemia Currently on high-dose Lipitor Goal LDL less than 70  Essential hypertension Blood pressure is well controlled on today's visit. No changes made to the medications.    Total encounter time more than 60 minutes  Greater than 50% was spent in counseling and coordination of care with the patient   Signed, Esmond Plants, M.D., Ph.D. [place], [place]

## 2021-05-15 NOTE — Assessment & Plan Note (Signed)
GI referral in process.  Iron supplements advised  Lab Results  Component Value Date   IRON 56 05/12/2021   TIBC 455 (H) 05/12/2021   FERRITIN 18 05/12/2021    Lab Results  Component Value Date   WBC 8.5 03/21/2021   HGB 11.7 (L) 03/21/2021   HCT 38.2 03/21/2021   MCV 84.5 03/21/2021   PLT 372 03/21/2021

## 2021-05-15 NOTE — Addendum Note (Signed)
Addended by: Crecencio Mc on: 05/15/2021 07:44 PM   Modules accepted: Orders

## 2021-05-17 ENCOUNTER — Telehealth: Payer: Self-pay

## 2021-05-17 NOTE — Telephone Encounter (Signed)
Scheduled for 07/28/2021

## 2021-05-18 ENCOUNTER — Ambulatory Visit: Payer: Medicare HMO | Admitting: Dermatology

## 2021-05-18 ENCOUNTER — Telehealth: Payer: Self-pay

## 2021-05-18 ENCOUNTER — Other Ambulatory Visit: Payer: Self-pay

## 2021-05-18 ENCOUNTER — Telehealth: Payer: Self-pay | Admitting: Internal Medicine

## 2021-05-18 DIAGNOSIS — L578 Other skin changes due to chronic exposure to nonionizing radiation: Secondary | ICD-10-CM | POA: Diagnosis not present

## 2021-05-18 DIAGNOSIS — L814 Other melanin hyperpigmentation: Secondary | ICD-10-CM | POA: Diagnosis not present

## 2021-05-18 DIAGNOSIS — L570 Actinic keratosis: Secondary | ICD-10-CM

## 2021-05-18 DIAGNOSIS — L821 Other seborrheic keratosis: Secondary | ICD-10-CM | POA: Diagnosis not present

## 2021-05-18 DIAGNOSIS — D229 Melanocytic nevi, unspecified: Secondary | ICD-10-CM

## 2021-05-18 DIAGNOSIS — Z85828 Personal history of other malignant neoplasm of skin: Secondary | ICD-10-CM | POA: Diagnosis not present

## 2021-05-18 NOTE — Telephone Encounter (Signed)
Patient would like a call back. She is not understanding her lab results.

## 2021-05-18 NOTE — Progress Notes (Signed)
Follow-Up Visit   Subjective  Katherine Jimenez is a 71 y.o. female who presents for the following: Skin Problem.  Patient presents to have several spots checked. She has a spot on the back of her leg that will not go away, also similar spot on her chest. History of skin cancer on the nose treated in the past.   The following portions of the chart were reviewed this encounter and updated as appropriate:       Review of Systems:  No other skin or systemic complaints except as noted in HPI or Assessment and Plan.  Objective  Well appearing patient in no apparent distress; mood and affect are within normal limits.  A focused examination was performed including face, trunk, legs, arms. Relevant physical exam findings are noted in the Assessment and Plan.  chest x 9, nasal dorsum x 2, L nasal tip x 1 (12) Pink scaly macules    Assessment & Plan  Actinic Damage - Severe, confluent actinic changes with pre-cancerous actinic keratoses  - Severe, chronic, not at goal, secondary to cumulative UV radiation exposure over time - diffuse scaly erythematous macules and papules with underlying dyspigmentation - Discussed Prescription "Field Treatment" for Severe, Chronic Confluent Actinic Changes with Pre-Cancerous Actinic Keratoses Field treatment involves treatment of an entire area of skin that has confluent Actinic Changes (Sun/ Ultraviolet light damage) and PreCancerous Actinic Keratoses by method of PhotoDynamic Therapy (PDT) and/or prescription Topical Chemotherapy agents such as 5-fluorouracil, 5-fluorouracil/calcipotriene, and/or imiquimod.  The purpose is to decrease the number of clinically evident and subclinical PreCancerous lesions to prevent progression to development of skin cancer by chemically destroying early precancer changes that may or may not be visible.  It has been shown to reduce the risk of developing skin cancer in the treated area. As a result of treatment, redness,  scaling, crusting, and open sores may occur during treatment course. One or more than one of these methods may be used and may have to be used several times to control, suppress and eliminate the PreCancerous changes. Discussed treatment course, expected reaction, and possible side effects. - Recommend daily broad spectrum sunscreen SPF 30+ to sun-exposed areas, reapply every 2 hours as needed.  - Staying in the shade or wearing long sleeves, sun glasses (UVA+UVB protection) and wide brim hats (4-inch brim around the entire circumference of the hat) are also recommended. - Call for new or changing lesions. -Discussed PDT treatments to the chest x 2, 1 month apart. Patient will schedule.   Seborrheic Keratoses - Stuck-on, waxy, tan-brown papules and/or plaques, including left posterior thigh  - Benign-appearing - Discussed benign etiology and prognosis. - Observe - Call for any changes  Melanocytic Nevi - Tan-brown and/or pink-flesh-colored symmetric macules and papules - Benign appearing on exam today - Observation - Call clinic for new or changing moles - Recommend daily use of broad spectrum spf 30+ sunscreen to sun-exposed areas.   Lentigines - Scattered tan macules - Due to sun exposure - Benign-appering, observe - Recommend daily broad spectrum sunscreen SPF 30+ to sun-exposed areas, reapply every 2 hours as needed. - Call for any changes   AK (actinic keratosis) (12) chest x 9, nasal dorsum x 2, L nasal tip x 1  Discussed PDT treatments to the chest x 2, 1 month apart. Patient will schedule.   Actinic keratoses are precancerous spots that appear secondary to cumulative UV radiation exposure/sun exposure over time. They are chronic with expected duration over 1 year. A  portion of actinic keratoses will progress to squamous cell carcinoma of the skin. It is not possible to reliably predict which spots will progress to skin cancer and so treatment is recommended to prevent  development of skin cancer.  Recommend daily broad spectrum sunscreen SPF 30+ to sun-exposed areas, reapply every 2 hours as needed.  Recommend staying in the shade or wearing long sleeves, sun glasses (UVA+UVB protection) and wide brim hats (4-inch brim around the entire circumference of the hat). Call for new or changing lesions.  Destruction of lesion - chest x 9, nasal dorsum x 2, L nasal tip x 1  Destruction method: cryotherapy   Informed consent: discussed and consent obtained   Lesion destroyed using liquid nitrogen: Yes   Region frozen until ice ball extended beyond lesion: Yes   Outcome: patient tolerated procedure well with no complications   Post-procedure details: wound care instructions given   Additional details:  Prior to procedure, discussed risks of blister formation, small wound, skin dyspigmentation, or rare scar following cryotherapy. Recommend Vaseline ointment to treated areas while healing.    Return for PDT x 2 to the chest, 1 month apart. Follow-up with Dr Nicole Kindred 1-2 months after treatment.  Lindi Adie, CMA, am acting as scribe for Brendolyn Patty, MD . Documentation: I have reviewed the above documentation for accuracy and completeness, and I agree with the above.  Brendolyn Patty MD

## 2021-05-18 NOTE — Patient Instructions (Addendum)
Cryotherapy Aftercare  Wash gently with soap and water everyday.   Apply Vaseline and Band-Aid daily until healed.     Actinic Damage - Severe, confluent actinic changes with pre-cancerous actinic keratoses  - Severe, chronic, not at goal, secondary to cumulative UV radiation exposure over time - Discussed Prescription "Field Treatment" for Severe, Chronic Confluent Actinic Changes with Pre-Cancerous Actinic Keratoses Field treatment involves treatment of an entire area of skin that has confluent Actinic Changes (Sun/ Ultraviolet light damage) and PreCancerous Actinic Keratoses by method of PhotoDynamic Therapy (PDT) and/or prescription Topical Chemotherapy agents such as 5-fluorouracil, 5-fluorouracil/calcipotriene, and/or imiquimod.  The purpose is to decrease the number of clinically evident and subclinical PreCancerous lesions to prevent progression to development of skin cancer by chemically destroying early precancer changes that may or may not be visible.  It has been shown to reduce the risk of developing skin cancer in the treated area. As a result of treatment, redness, scaling, crusting, and open sores may occur during treatment course. One or more than one of these methods may be used and may have to be used several times to control, suppress and eliminate the PreCancerous changes. Discussed treatment course, expected reaction, and possible side effects. - Recommend daily broad spectrum sunscreen SPF 30+ to sun-exposed areas, reapply every 2 hours as needed.  - Staying in the shade or wearing long sleeves, sun glasses (UVA+UVB protection) and wide brim hats (4-inch brim around the entire circumference of the hat) are also recommended. - Call for new or changing lesions.    If You Need Anything After Your Visit  If you have any questions or concerns for your doctor, please call our main line at [phone] and press option 4 to reach your doctor's medical assistant. If no one answers,  please leave a voicemail as directed and we will return your call as soon as possible. Messages left after 4 pm will be answered the following business day.   You may also send [place] a message via [place]. We typically respond to MyChart messages within 1-2 business days.  For prescription refills, please ask your pharmacy to contact our office. Our fax number is [phone].  If you have an urgent issue when the clinic is closed that cannot wait until the next business day, you can page your doctor at the number below.    Please note that while we do our best to be available for urgent issues outside of office hours, we are not available 24/7.   If you have an urgent issue and are unable to reach [place], you may choose to seek medical care at your doctor's office, retail clinic, urgent care center, or emergency room.  If you have a medical emergency, please immediately call 911 or go to the emergency department.  Pager Numbers  - Dr. Nehemiah Massed: [phone]  - Dr. Laurence Ferrari: [phone]  - Dr. Nicole Kindred: [phone]  In the event of inclement weather, please call our main line at [phone] for an update on the status of any delays or closures.  Dermatology Medication Tips: Please keep the boxes that topical medications come in in order to help keep track of the instructions about where and how to use these. Pharmacies typically print the medication instructions only on the boxes and not directly on the medication tubes.   If your medication is too expensive, please contact our office at [phone] option 4 or send [place] a message through [place].   We are unable to tell what your co-pay for  medications will be in advance as this is different depending on your insurance coverage. However, we may be able to find a substitute medication at lower cost or fill out paperwork to get insurance to cover a needed medication.   If a prior authorization is required to get your medication covered by your  insurance company, please allow [place] 1-2 business days to complete this process.  Drug prices often vary depending on where the prescription is filled and some pharmacies may offer cheaper prices.  The website www.goodrx.com contains coupons for medications through different pharmacies. The prices here do not account for what the cost may be with help from insurance (it may be cheaper with your insurance), but the website can give you the price if you did not use any insurance.  - You can print the associated coupon and take it with your prescription to the pharmacy.  - You may also stop by our office during regular business hours and pick up a GoodRx coupon card.  - If you need your prescription sent electronically to a different pharmacy, notify our office through [facility] or by phone at [phone] option 4.     Si Usted Necesita Algo Despus de Su Visita  Tambin puede enviarnos un mensaje a travs de Pharmacist, community. Por lo general respondemos a los mensajes de MyChart en el transcurso de 1 a 2 das hbiles.  Para renovar recetas, por favor pida a su farmacia que se ponga en contacto con nuestra oficina. Harland Dingwall de fax es [place] [phone].  Si tiene un asunto urgente cuando la clnica est cerrada y que no puede esperar hasta el siguiente da hbil, puede llamar/localizar a su doctor(a) al nmero que aparece a continuacin.   Por favor, tenga en cuenta que aunque hacemos todo lo posible para estar disponibles para asuntos urgentes fuera del horario de [place], no estamos disponibles las 24 horas del da, los 7 das de la [place].   Si tiene un problema urgente y no puede comunicarse con nosotros, puede optar por buscar atencin mdica  en el consultorio de su doctor(a), en una clnica privada, en un centro de atencin urgente o en una sala de emergencias.  Si tiene Engineering geologist, por favor llame inmediatamente al 911 o vaya a la sala de emergencias.  Nmeros de  bper  - Dr. Nehemiah Massed: [phone]  - Dra. Moye: [phone]  - Dra. Nicole Kindred: [phone]  En caso de inclemencias del [place], por favor llame a Johnsie Kindred principal al [phone] para una actualizacin sobre el [place] de cualquier retraso o cierre.  Consejos para la medicacin en dermatologa: Por favor, guarde las cajas en las que vienen los medicamentos de uso tpico para ayudarle a seguir las instrucciones sobre dnde y cmo usarlos. Las farmacias generalmente imprimen las instrucciones del medicamento slo en las cajas y no directamente en los tubos del [place].   Si su medicamento es muy caro, por favor, pngase en contacto con Zigmund Daniel llamando al [phone] y presione la opcin 4 o envenos un mensaje a travs de Pharmacist, community.   No podemos decirle cul ser su copago por los medicamentos por adelantado ya que esto es diferente dependiendo de la cobertura de su seguro. Sin embargo, es posible que podamos encontrar un medicamento sustituto a Electrical engineer un formulario para que el seguro cubra el medicamento que se considera necesario.   Si se requiere una autorizacin previa para que su compaa de seguros [place] su medicamento, por favor permtanos  de 1 a 2 das hbiles para completar este proceso.  Los precios de los medicamentos varan con frecuencia dependiendo del Environmental consultant de dnde se surte la receta y alguna farmacias pueden ofrecer precios ms baratos.  El sitio web www.goodrx.com tiene cupones para medicamentos de Airline pilot. Los precios aqu no tienen en cuenta lo que podra costar con la ayuda del seguro (puede ser ms barato con su seguro), pero el sitio web puede darle el precio si no utiliz Research scientist (physical sciences).  - Puede imprimir el cupn correspondiente y llevarlo con su receta a la farmacia.  - Tambin puede pasar por nuestra oficina durante el horario de atencin regular y Charity fundraiser una tarjeta de cupones de GoodRx.  - Si necesita que su receta se  enve electrnicamente a una farmacia diferente, informe a nuestra oficina a travs de MyChart de [place] o por telfono llamando al [phone] y presione la opcin 4.

## 2021-05-18 NOTE — Chronic Care Management (AMB) (Signed)
°  Care Management   Note  05/18/2021 Name: PHILOMENA BUTTERMORE MRN: [id number] DOB: [birth year]  Deborra Medina is a 71 y.o. year old female who is a primary care patient of Derrel Nip, Aris Everts, MD and is actively engaged with the care management team. I reached out to Deborra Medina by phone today to assist with re-scheduling a follow up visit with the Pharmacist  Follow up plan: Unsuccessful telephone outreach attempt made. A HIPAA compliant phone message was left for the patient providing contact information and requesting a return call.  The care management team will reach out to the patient again over the next 5 days.  If patient returns call to provider office, please advise to call [facility]  at [address], [place], [place], [place] [address] Direct Dial: [phone] [email or web address] Website: [email or web address]

## 2021-05-19 NOTE — Telephone Encounter (Signed)
LMTCB

## 2021-05-20 DIAGNOSIS — M48062 Spinal stenosis, lumbar region with neurogenic claudication: Secondary | ICD-10-CM | POA: Diagnosis not present

## 2021-05-20 DIAGNOSIS — M5416 Radiculopathy, lumbar region: Secondary | ICD-10-CM | POA: Diagnosis not present

## 2021-05-20 NOTE — Telephone Encounter (Signed)
Spoke with pt and she stated that she is not having any side effects from the medication. Pt stated that she is doing good with the medication.

## 2021-05-20 NOTE — Telephone Encounter (Signed)
Spoke with pt and discussed her lab further. Pt gave a verbal understanding.

## 2021-05-20 NOTE — Chronic Care Management (AMB) (Signed)
°  Care Management   Note  05/20/2021 Name: GILDA ABBOUD MRN: [id number] DOB: Aug 07, 1950  Deborra Medina is a 71 y.o. year old female who is a primary care patient of Derrel Nip, Aris Everts, MD and is actively engaged with the care management team. I reached out to Deborra Medina by phone today to assist with re-scheduling a follow up visit with the Pharmacist  Follow up plan: Unsuccessful telephone outreach attempt made. A HIPAA compliant phone message was left for the patient providing contact information and requesting a return call.  The care management team will reach out to the patient again over the next 7 days.  If patient returns call to provider office, please advise to call [facility]  at [place], [place], [place], [place] [address] Direct Dial: [phone] [email or web address] Website: [email or web address]

## 2021-05-24 ENCOUNTER — Telehealth (HOSPITAL_COMMUNITY): Payer: Self-pay | Admitting: *Deleted

## 2021-05-24 NOTE — Telephone Encounter (Signed)
Reaching out to patient to offer assistance regarding upcoming cardiac imaging study; pt verbalizes understanding of appt date/time, parking situation and where to check in, pre-test NPO status and medications ordered, and verified current allergies; name and call back number provided for further questions should they arise ? ?Aleph Nickson RN Navigator Cardiac Imaging ?[facility] ?[phone] office ?[phone] cell ? ?Patient to take 100mg metoprolol tartrate two hours prior to her cardiac CT scan.  ?

## 2021-05-25 NOTE — Chronic Care Management (AMB) (Signed)
°  Care Management   Note  05/25/2021 Name: Katherine Jimenez MRN: [id number] DOB: 1950/12/22  Katherine Jimenez is a 71 y.o. year old female who is a primary care patient of Derrel Nip, Aris Everts, MD and is actively engaged with the care management team. I reached out to Katherine Jimenez by phone today to assist with re-scheduling a follow up visit with the Pharmacist  Follow up plan: Telephone appointment with care management team member scheduled for:06/07/2021  Noreene Larsson, [address], [place], [place] [address] Direct Dial: [phone] [email or web address] Website: [email or web address]

## 2021-05-26 ENCOUNTER — Telehealth: Payer: Medicare HMO

## 2021-05-26 ENCOUNTER — Ambulatory Visit
Admission: RE | Admit: 2021-05-26 | Discharge: 2021-05-26 | Disposition: A | Discharge status: Home / Self Care | Payer: Medicare HMO | Source: Ambulatory Visit | Attending: Cardiovascular Disease | Admitting: Cardiovascular Disease

## 2021-05-26 ENCOUNTER — Other Ambulatory Visit: Payer: Self-pay

## 2021-05-26 DIAGNOSIS — I208 Other forms of angina pectoris: Secondary | ICD-10-CM | POA: Diagnosis not present

## 2021-05-26 DIAGNOSIS — I639 Cerebral infarction, unspecified: Secondary | ICD-10-CM | POA: Diagnosis not present

## 2021-05-26 DIAGNOSIS — I2089 Other forms of angina pectoris: Secondary | ICD-10-CM

## 2021-05-26 MED ORDER — METOPROLOL TARTRATE 5 MG/5ML IV SOLN
10.0000 mg | Freq: Once | INTRAVENOUS | Status: AC
  Administered 2021-05-26: 10 mg via INTRAVENOUS

## 2021-05-26 MED ORDER — DILTIAZEM HCL 25 MG/5ML IV SOLN
10.0000 mg | Freq: Once | INTRAVENOUS | Status: AC
  Administered 2021-05-26: 10 mg via INTRAVENOUS

## 2021-05-26 MED ORDER — NITROGLYCERIN 0.4 MG SL SUBL
0.8000 mg | SUBLINGUAL_TABLET | Freq: Once | SUBLINGUAL | Status: AC
  Administered 2021-05-26: 0.8 mg via SUBLINGUAL

## 2021-05-26 MED ORDER — IOHEXOL 350 MG/ML SOLN
80.0000 mL | Freq: Once | INTRAVENOUS | Status: AC | PRN
  Administered 2021-05-26: 75 mL via INTRAVENOUS

## 2021-05-26 NOTE — Progress Notes (Signed)
Patient tolerated CT well. Drank water after. Vital signs stable encourage to drink water throughout day.Reasons explained and verbalized understanding. Ambulated steady gait.  

## 2021-05-30 ENCOUNTER — Encounter: Payer: Self-pay | Admitting: Internal Medicine

## 2021-05-30 ENCOUNTER — Other Ambulatory Visit: Payer: Self-pay

## 2021-05-30 ENCOUNTER — Ambulatory Visit (INDEPENDENT_AMBULATORY_CARE_PROVIDER_SITE_OTHER): Payer: Medicare HMO | Admitting: Internal Medicine

## 2021-05-30 VITALS — BP 156/90 | HR 89 | Temp 98.0°F | Ht 60.0 in | Wt 146.6 lb

## 2021-05-30 DIAGNOSIS — D509 Iron deficiency anemia, unspecified: Secondary | ICD-10-CM

## 2021-05-30 DIAGNOSIS — M48061 Spinal stenosis, lumbar region without neurogenic claudication: Secondary | ICD-10-CM | POA: Diagnosis not present

## 2021-05-30 DIAGNOSIS — M2550 Pain in unspecified joint: Secondary | ICD-10-CM

## 2021-05-30 DIAGNOSIS — R748 Abnormal levels of other serum enzymes: Secondary | ICD-10-CM

## 2021-05-30 DIAGNOSIS — I10 Essential (primary) hypertension: Secondary | ICD-10-CM

## 2021-05-30 DIAGNOSIS — I700 Atherosclerosis of aorta: Secondary | ICD-10-CM | POA: Diagnosis not present

## 2021-05-30 DIAGNOSIS — R0781 Pleurodynia: Secondary | ICD-10-CM

## 2021-05-30 DIAGNOSIS — I2584 Coronary atherosclerosis due to calcified coronary lesion: Secondary | ICD-10-CM

## 2021-05-30 DIAGNOSIS — G3184 Mild cognitive impairment, so stated: Secondary | ICD-10-CM

## 2021-05-30 MED ORDER — DULOXETINE HCL 40 MG PO CPEP: 40.0000 mg | ORAL_CAPSULE | Freq: Every day | ORAL | 2 refills | Status: DC

## 2021-05-30 MED ORDER — LOSARTAN POTASSIUM 50 MG PO TABS: 50.0000 mg | ORAL_TABLET | Freq: Every day | ORAL | 3 refills | Status: DC

## 2021-05-30 MED ORDER — CELECOXIB 200 MG PO CAPS: 200.0000 mg | ORAL_CAPSULE | Freq: Two times a day (BID) | ORAL | 1 refills | Status: DC

## 2021-05-30 NOTE — Assessment & Plan Note (Signed)
Advised to continue atorvastatin

## 2021-05-30 NOTE — Assessment & Plan Note (Signed)
Managed by neurology with aricept . dose recently increased

## 2021-05-30 NOTE — Assessment & Plan Note (Signed)
No evidence of significant CAD by cardiac CT

## 2021-05-30 NOTE — Assessment & Plan Note (Addendum)
S/p ESI on Feb 17 by Chasnis with no improvement in pain. WILL INCREASE cymbalta to 40 mg daily  Continue tylenol and change meloxicam to celebrex 200 mg bid as a trial . cmet today

## 2021-05-30 NOTE — Assessment & Plan Note (Signed)
Recurrent episodes of LG fevers,  bodya aches  Most recent episodes this weekend.  Checking ESR. Uric acid and Rheumatoid factor

## 2021-05-30 NOTE — Patient Instructions (Addendum)
Continue atorvastatin TO REDUCE RISK OF PLACQUE RUPTURE   Increase cymbalta dose to 40 mg . Daily     Continue tylenol 100 mg twice daily   I Recommend that you change meloxicam to celebrex . Take it 200 mg twice daily

## 2021-05-30 NOTE — Assessment & Plan Note (Signed)
Referring to GI for colonoscopy

## 2021-05-30 NOTE — Assessment & Plan Note (Signed)
Elevated due to lapse in meds for the past 48 hours. Resume losartan 50 mg daily

## 2021-05-30 NOTE — Progress Notes (Addendum)
Subjective:  Patient ID: Katherine Jimenez, female    DOB: March 12, 1951  Age: 71 y.o. MRN: [id number]  CC: The primary encounter diagnosis was Thoracic aortic atherosclerosis ([place]). Diagnoses of Coronary atherosclerosis due to calcified coronary lesion (CODE), Degenerative lumbar spinal stenosis, Polyarthralgia, Primary hypertension, Pleurodynia, Iron deficiency anemia, unspecified iron deficiency anemia type, MCI (mild cognitive impairment) with memory loss, and Elevated liver enzymes were also pertinent to this visit.   This visit occurred during the SARS-CoV-2 public health emergency.  Safety protocols were in place, including screening questions prior to the visit, additional usage of staff PPE, and extensive cleaning of exam room while observing appropriate contact time as indicated for disinfecting solutions.    Katherine Jimenez presents for  Chief Complaint  Patient presents with   Follow-up    Follow up on back pain. Pt had injections about two weeks ago and they are not helping to control her pain. Pain in located from shoulders down to lower back. Pt stated that she would also like to discuss some test that she had done with Dr. Rockey Situ last week.    1) back pain   ; persistent despite ESI I the lumbar spine  2 weeks ago.  Thoracic and lumbar pain .  Has had 6 visits over the past 2 years in the lumbar spine  . Taking meloxicam and tylenol on a regular .    2) recurrent episodes of low grade fever, body aches,  Arms will start aching as the first sign.  Would last 24 hours,  eyes would burn.  Has been occurring for years,  lately lasting > 48 hours and occurring  less than once a month   3) right shoulder pain .    4) IDA:  GI referral made FOR COLONOSCOPY  5)DEMENTIA  ;  ARICEPT DOSE INCREASED    5) Ozempic :  has lost  12   lbs  by home scales .  Taking 0.5 mg    Lab Results  Component Value Date   HGBA1C 6.0 (H) 03/22/2021     3) cardiac ct scan done by Dr Rockey Situ.  Patient requesting and explanation from me.   3) post COVD CVA   Outpatient Medications Prior to Visit  Medication Sig Dispense Refill   acetaminophen (TYLENOL) 500 MG tablet Take 1,000 mg by mouth every 6 (six) hours as needed (FOR PAIN.).     aspirin EC 81 MG tablet Take 81 mg by mouth daily. Swallow whole.     atorvastatin (LIPITOR) 80 MG tablet Take 1 tablet (80 mg total) by mouth daily. 30 tablet 2   Biotin 5 MG CAPS Take 2 capsules by mouth 2 (two) times daily.      blood glucose meter kit and supplies Dispense Contour next bayer meter. E11.9 To check blood glucose  Once a day. 1 each 0   buPROPion (WELLBUTRIN XL) 300 MG 24 hr tablet TAKE ONE TABLET BY MOUTH DAILY 30 tablet 1   Cholecalciferol (VITAMIN D3) 1000 units CAPS Take 1,000 Units by mouth daily.      cyanocobalamin (,VITAMIN B-12,) 1000 MCG/ML injection INJECT 1 ML INTO THE MUSCLE EVERY 30 DAYS 3 mL 1   diphenhydramine-acetaminophen (TYLENOL PM) 25-500 MG TABS tablet Take 1 tablet by mouth at bedtime as needed.     donepezil (ARICEPT) 10 MG tablet Take 10 mg by mouth at bedtime.     glucose blood (FREESTYLE LITE) test strip Use once daily  as instructed to  test blood sugar E 11.9 100 each 3   lamoTRIgine (LAMICTAL) 200 MG tablet TAKE ONE TABLET BY MOUTH TWICE A DAY 180 tablet 1   Lancets (FREESTYLE) lancets Use once daily to check blood sugars  E11.9 100 each 3   nitroGLYCERIN (NITROSTAT) 0.4 MG SL tablet Place 1 tablet (0.4 mg total) under the tongue every 5 (five) minutes as needed for chest pain. 50 tablet 3   omeprazole (PRILOSEC) 40 MG capsule TAKE ONE CAPSULE BY MOUTH DAILY 90 capsule 3   Semaglutide,0.25 or 0.5MG/DOS, (OZEMPIC, 0.25 OR 0.5 MG/DOSE,) 2 MG/1.5ML SOPN Inject 0.5 mg into the skin once a week. Inject 0.25 mg weekly for 4 weeks then increase to 0.5 mg weekly 1.5 mL 0   Syringe, Disposable, 1 ML MISC For use with B12 injections 25 each 3   temazepam (RESTORIL) 22.5 MG capsule TAKE ONE CAPSULE BY MOUTH EVERY  NIGHT AT BEDTIME AS NEEDED FOR SLEEP 30 capsule 3   DULoxetine (CYMBALTA) 30 MG capsule TAKE ONE CAPSULE BY MOUTH DAILY 90 capsule 0   losartan (COZAAR) 25 MG tablet TAKE TWO TABLETS BY MOUTH DAILY 180 tablet 1   metoprolol tartrate (LOPRESSOR) 100 MG tablet Take 1 tablet (100 mg total) by mouth once for 1 dose. Take 2 hours before your Cardiac CTA procedure 1 tablet 0   No facility-administered medications prior to visit.    Review of Systems;  Patient denies headache, fevers, malaise, unintentional weight loss, skin rash, eye pain, sinus congestion and sinus pain, sore throat, dysphagia,  hemoptysis , cough, dyspnea, wheezing, chest pain, palpitations, orthopnea, edema, abdominal pain, nausea, melena, diarrhea, constipation, flank pain, dysuria, hematuria, urinary  Frequency, nocturia, numbness, tingling, seizures,  Focal weakness, Loss of consciousness,  Tremor, insomnia, depression, anxiety, and suicidal ideation.      Objective:  BP (!) 156/90 (BP Location: Left Arm, Patient Position: Sitting, Cuff Size: Normal)    Pulse 89    Temp 98 F (36.7 C) (Oral)    Ht 5' (1.524 m)    Wt 146 lb 9.6 oz (66.5 kg)    SpO2 97%    BMI 28.63 kg/m   BP Readings from Last 3 Encounters:  05/30/21 (!) 156/90  05/26/21 114/69  05/13/21 130/83    Wt Readings from Last 3 Encounters:  05/30/21 146 lb 9.6 oz (66.5 kg)  05/13/21 148 lb (67.1 kg)  04/06/21 148 lb 3.2 oz (67.2 kg)    General appearance: alert, cooperative and appears stated age Ears: normal TM's and external ear canals both ears Throat: lips, mucosa, and tongue normal; teeth and gums normal Neck: no adenopathy, no carotid bruit, supple, symmetrical, trachea midline and thyroid not enlarged, symmetric, no tenderness/mass/nodules Back: symmetric, no curvature. ROM normal. No CVA tenderness. Lungs: clear to auscultation bilaterally Heart: regular rate and rhythm, S1, S2 normal, no murmur, click, rub or gallop Abdomen: soft, non-tender;  bowel sounds normal; no masses,  no organomegaly Pulses: 2+ and symmetric Skin: Skin color, texture, turgor normal. No rashes or lesions Lymph nodes: Cervical, supraclavicular, and axillary nodes normal.  Lab Results  Component Value Date   HGBA1C 6.0 (H) 03/22/2021   HGBA1C 5.9 (H) 03/21/2021   HGBA1C 6.6 (H) 01/06/2021    Lab Results  Component Value Date   CREATININE 0.70 05/30/2021   CREATININE 0.91 05/12/2021   CREATININE 0.79 03/21/2021    Lab Results  Component Value Date   WBC 8.5 03/21/2021   HGB 11.7 (L) 03/21/2021   HCT 38.2 03/21/2021  PLT 372 03/21/2021   GLUCOSE 107 (H) 05/30/2021   CHOL 149 05/12/2021   TRIG 119.0 05/12/2021   HDL 86.20 05/12/2021   LDLDIRECT 112.0 10/13/2016   LDLCALC 39 05/12/2021   ALT 422 (H) 05/30/2021   AST 204 (H) 05/30/2021   NA 137 05/30/2021   K 3.8 05/30/2021   CL 98 05/30/2021   CREATININE 0.70 05/30/2021   BUN 10 05/30/2021   CO2 28 05/30/2021   TSH 0.93 04/16/2020   INR 1.0 03/21/2021   HGBA1C 6.0 (H) 03/22/2021   MICROALBUR 2.0 04/16/2020    CT CORONARY MORPH W/CTA COR W/SCORE W/CA W/CM &/OR WO/CM  Addendum Date: 05/26/2021   ADDENDUM REPORT: 05/26/2021 15:15 EXAM: OVER-READ INTERPRETATION  CT CHEST The following report is an over-read performed by radiologist Dr. Eben Burow [facility] on 05/26/2021. This over-read does not include interpretation of cardiac or coronary anatomy or pathology. The coronary calcium score/coronary CTA interpretation by the cardiologist is attached. COMPARISON:  None. FINDINGS: No suspicious nodules, masses, or infiltrates are identified in the visualized portion of the lungs. No pleural fluid seen. The visualized portions of the mediastinum and chest wall are unremarkable. A small hiatal hernia is noted. IMPRESSION: Small hiatal hernia. Electronically Signed   By: Marlaine Hind M.D.   On: 05/26/2021 15:15   Result Date: 05/26/2021 CLINICAL DATA:  Chest pain EXAM:  Cardiac/Coronary  CTA TECHNIQUE: The patient was scanned on a Siemens Somatom go.Top scanner. : A retrospective scan was triggered in the descending thoracic aorta. Axial non-contrast 3 mm slices were carried out through the heart. The data set was analyzed on a dedicated work station and scored using the [place]. Gantry rotation speed was 330 msecs and collimation was .6 mm. 126m of metoprolol and 0.8 mg of sl NTG was given. The 3D data set was reconstructed in 5% intervals of the 60-95 % of the R-R cycle. Diastolic phases were analyzed on a dedicated work station using MPR, MIP and VRT modes. The patient received 75 cc of contrast. FINDINGS: Aorta: Normal size. Mild aortic root and descending aorta calcifications. No dissection. Aortic Valve:  Trileaflet.  No calcifications. Coronary Arteries:  Normal coronary origin.  Right dominance. RCA is a dominant artery that gives rise to PDA and PLA. There is mild calcified plaque in the mid LAD causing minimal stenosis (<25%). Left main is a large artery that gives rise to LAD and LCX arteries. There is no LM disease LAD has calcified and non calcified plaque in the proximal segment causing mild stenosis (25%). LCX is a non-dominant artery that gives rise to two obtuse marginal branches. There is no plaque. Other findings: Normal pulmonary vein drainage into the left atrium. Normal left atrial appendage without a thrombus. Normal size of the pulmonary artery. IMPRESSION: 1. Coronary calcium score of 62.4. This was 68th percentile for age and sex matched control. 2. Normal coronary origin with right dominance. 3. Mild proximal LAD stenosis (25%). 4. Minimal mid RCA disease (<25%) 5. CAD-RADS 2. Mild non-obstructive CAD (25-49%). Consider non-atherosclerotic causes of chest pain. Consider preventive therapy and risk factor modification. Electronically Signed: By: BKate SableM.D. On: 05/26/2021 14:05    Assessment & Plan:   Problem List Items Addressed  This Visit     Chest pain    No evidence of significant CAD by cardiac CT      Coronary atherosclerosis due to calcified coronary lesion (CODE)   Relevant Medications   losartan (COZAAR) 50 MG tablet  Degenerative lumbar spinal stenosis    S/p ESI on Feb 17 by Chasnis with no improvement in pain. WILL INCREASE cymbalta to 40 mg daily  Continue tylenol and change meloxicam to celebrex 200 mg bid as a trial . cmet today       Elevated liver enzymes    Etiology unclear.  Additional serologies and liver ultrasound ordered.  Advised to stop lipitor and tylenol .  GI referral made       Relevant Orders   Acute Viral Hepatitis (HAV, HBV, HCV)   ANA   Alpha-1-antitrypsin   AntiMicrosomal Ab-Liver / Kidney   Anti-Smith antibody   Anti-smooth muscle antibody, IgG   Ceruloplasmin   Mitochondrial antibodies   IBC + Ferritin   US Abdomen Limited   Ambulatory referral to Gastroenterology   Hypertension    Elevated due to lapse in meds for the past 48 hours. Resume losartan 50 mg daily       Relevant Medications   losartan (COZAAR) 50 MG tablet   Iron deficiency anemia    Referring to GI for colonoscopy       MCI (mild cognitive impairment) with memory loss    Managed by neurology with aricept . dose recently increased       Polyarthralgia    Recurrent episodes of LG fevers,  bodya aches  Most recent episodes this weekend.  Checking ESR. Uric acid and Rheumatoid factor       Relevant Orders   Rheumatoid Factor (Completed)   Uric acid (Completed)   Comprehensive metabolic panel (Completed)   Thoracic aortic atherosclerosis (HCC) - Primary    Advised to continue atorvastatin       Relevant Medications   losartan (COZAAR) 50 MG tablet    I spent 30 minutes dedicated to the care of this patient on the date of this encounter to include pre-visit review of patient's medical history,  most recent imaging studies, Face-to-face time with the patient , and post visit ordering of  testing and therapeutics.    Follow-up: No follow-ups on file.   Crecencio Mc, MD

## 2021-05-31 ENCOUNTER — Other Ambulatory Visit: Payer: Self-pay | Admitting: Internal Medicine

## 2021-05-31 ENCOUNTER — Encounter: Payer: Self-pay | Admitting: Internal Medicine

## 2021-05-31 DIAGNOSIS — R748 Abnormal levels of other serum enzymes: Secondary | ICD-10-CM

## 2021-05-31 HISTORY — DX: Abnormal levels of other serum enzymes: R74.8

## 2021-05-31 LAB — COMPREHENSIVE METABOLIC PANEL
ALT: 422 U/L — ABNORMAL HIGH (ref 0–35)
AST: 204 U/L — ABNORMAL HIGH (ref 0–37)
Albumin: 4.6 g/dL (ref 3.5–5.2)
Alkaline Phosphatase: 650 U/L — ABNORMAL HIGH (ref 39–117)
BUN: 10 mg/dL (ref 6–23)
CO2: 28 mEq/L (ref 19–32)
Calcium: 10.1 mg/dL (ref 8.4–10.5)
Chloride: 98 mEq/L (ref 96–112)
Creatinine, Ser: 0.7 mg/dL (ref 0.40–1.20)
GFR: 88.32 mL/min (ref >60.00)
Glucose, Bld: 107 mg/dL — ABNORMAL HIGH (ref 70–99)
Potassium: 3.8 mEq/L (ref 3.5–5.1)
Sodium: 137 mEq/L (ref 135–145)
Total Bilirubin: 0.6 mg/dL (ref 0.2–1.2)
Total Protein: 7.4 g/dL (ref 6.0–8.3)

## 2021-05-31 LAB — URIC ACID: Uric Acid, Serum: 2.8 mg/dL (ref 2.4–7.0)

## 2021-05-31 LAB — RHEUMATOID FACTOR: Rheumatoid fact SerPl-aCnc: <14 IU/mL (ref <14)

## 2021-05-31 NOTE — Telephone Encounter (Signed)
Called pt to let her know that the labs have not been resulted yet so I was unable to give her any information. Pt stated that she is extremely worried because the liver enzymes are very elevated. Pt stated that she would like a call back tonight with results.

## 2021-05-31 NOTE — Telephone Encounter (Signed)
Pt called in for results on lab. Pt requesting callback

## 2021-05-31 NOTE — Addendum Note (Signed)
Addended by: Crecencio Mc on: 05/31/2021 05:21 PM   Modules accepted: Orders

## 2021-05-31 NOTE — Assessment & Plan Note (Signed)
Etiology unclear.  Additional serologies and liver ultrasound ordered.  Advised to stop lipitor and tylenol .  GI referral made

## 2021-06-01 ENCOUNTER — Other Ambulatory Visit (INDEPENDENT_AMBULATORY_CARE_PROVIDER_SITE_OTHER): Payer: Medicare HMO

## 2021-06-01 ENCOUNTER — Telehealth: Payer: Self-pay | Admitting: Internal Medicine

## 2021-06-01 ENCOUNTER — Other Ambulatory Visit: Payer: Self-pay | Admitting: Internal Medicine

## 2021-06-01 DIAGNOSIS — R748 Abnormal levels of other serum enzymes: Secondary | ICD-10-CM | POA: Diagnosis not present

## 2021-06-01 DIAGNOSIS — M19011 Primary osteoarthritis, right shoulder: Secondary | ICD-10-CM | POA: Diagnosis not present

## 2021-06-01 LAB — IBC + FERRITIN
Ferritin: 42.2 ng/mL (ref 10.0–291.0)
Iron: 101 ug/dL (ref 42–145)
Saturation Ratios: 21.5 % (ref 20.0–50.0)
TIBC: 469 ug/dL — ABNORMAL HIGH (ref 250.0–450.0)
Transferrin: 335 mg/dL (ref 212.0–360.0)

## 2021-06-01 NOTE — Telephone Encounter (Signed)
Lft pt vm to call ofc ofc to sch STAT US. thanks ?

## 2021-06-02 ENCOUNTER — Telehealth: Payer: Self-pay

## 2021-06-02 ENCOUNTER — Ambulatory Visit
Admission: RE | Admit: 2021-06-02 | Discharge: 2021-06-02 | Disposition: A | Discharge status: Home / Self Care | Payer: Medicare HMO | Source: Ambulatory Visit | Attending: Internal Medicine | Admitting: Internal Medicine

## 2021-06-02 ENCOUNTER — Other Ambulatory Visit: Payer: Self-pay

## 2021-06-02 DIAGNOSIS — R748 Abnormal levels of other serum enzymes: Secondary | ICD-10-CM | POA: Insufficient documentation

## 2021-06-02 DIAGNOSIS — R945 Abnormal results of liver function studies: Secondary | ICD-10-CM | POA: Diagnosis not present

## 2021-06-02 DIAGNOSIS — K8020 Calculus of gallbladder without cholecystitis without obstruction: Secondary | ICD-10-CM | POA: Diagnosis not present

## 2021-06-02 LAB — ACUTE VIRAL HEPATITIS (HAV, HBV, HCV)
HCV Ab: NONREACTIVE
Hep A IgM: NEGATIVE
Hep B C IgM: NEGATIVE
Hepatitis B Surface Ag: NEGATIVE

## 2021-06-02 LAB — HCV INTERPRETATION

## 2021-06-02 NOTE — Chronic Care Management (AMB) (Signed)
?  Care Management  ? ?Note ? ?06/02/2021 ?Name: MADALENE MICKLER MRN: [id number] DOB: 1950-06-26 ? ?BETTYJANE SHENOY is a 71 y.o. year old female who is a primary care patient of Derrel Nip, Aris Everts, MD and is actively engaged with the care management team. I reached out to [employer] by phone today to assist with re-scheduling a follow up visit with the Pharmacist ? ?Follow up plan: ?Unsuccessful telephone outreach attempt made. A HIPAA compliant phone message was left for the patient providing contact information and requesting a return call.  ?The care management team will reach out to the patient again over the next 3 days.  ?If patient returns call to provider office, please advise to call [place]  at [phone] ? ?Noreene Larsson, RMA ?Care Guide, Embedded Care Coordination ?[facility]  ?[place], [place] [address] ?Direct Dial: [phone] ?[email or web address] ?Website: [email or web address]  ? ?

## 2021-06-02 NOTE — Telephone Encounter (Signed)
Called patient she is already scheduled  ?

## 2021-06-02 NOTE — Telephone Encounter (Signed)
Called and advised all labs are not back and PCP out of office, will call soon as we have rest of labs. ?

## 2021-06-02 NOTE — Telephone Encounter (Signed)
Called patient she I scheduled for 07/28/2021 ?

## 2021-06-02 NOTE — Addendum Note (Signed)
Addended by: Crecencio Mc on: 06/02/2021 10:14 PM ? ? Modules accepted: Orders ? ?

## 2021-06-02 NOTE — Telephone Encounter (Signed)
Pt called in requesting to speak with [facility]. Pt stated that its personal. Advise Pt [name] is busy. Pt stated to send message back to Janett Billow to advise her she would like results for lab as soon as possible. Pt requesting callback  ?

## 2021-06-03 ENCOUNTER — Other Ambulatory Visit: Payer: Self-pay | Admitting: Internal Medicine

## 2021-06-03 DIAGNOSIS — R748 Abnormal levels of other serum enzymes: Secondary | ICD-10-CM

## 2021-06-06 ENCOUNTER — Ambulatory Visit: Payer: Medicare HMO

## 2021-06-06 ENCOUNTER — Ambulatory Visit (INDEPENDENT_AMBULATORY_CARE_PROVIDER_SITE_OTHER): Payer: Medicare HMO | Admitting: Dermatology

## 2021-06-06 ENCOUNTER — Other Ambulatory Visit: Payer: Self-pay

## 2021-06-06 ENCOUNTER — Telehealth: Payer: Self-pay | Admitting: Internal Medicine

## 2021-06-06 DIAGNOSIS — L570 Actinic keratosis: Secondary | ICD-10-CM

## 2021-06-06 LAB — ANTI-SMOOTH MUSCLE ANTIBODY, IGG: Actin (Smooth Muscle) Antibody (IGG): <20 U (ref <20)

## 2021-06-06 LAB — ANTI-SMITH ANTIBODY: ENA SM Ab Ser-aCnc: <1 AI

## 2021-06-06 LAB — ANTI-MICROSOMAL ANTIBODY LIVER / KIDNEY: LKM1 Ab: <=20 U (ref <=20.0)

## 2021-06-06 LAB — CERULOPLASMIN: Ceruloplasmin: 31 mg/dL (ref 18–53)

## 2021-06-06 LAB — ACETAMINOPHEN LEVEL: Acetaminophen (Tylenol), Serum: <10 mg/L — ABNORMAL LOW (ref 10–20)

## 2021-06-06 LAB — MITOCHONDRIAL ANTIBODIES: Mitochondrial M2 Ab, IgG: <=20 U (ref <=20.0)

## 2021-06-06 LAB — ANA: Anti Nuclear Antibody (ANA): NEGATIVE

## 2021-06-06 LAB — ALPHA-1-ANTITRYPSIN: A-1 Antitrypsin, Ser: 240 mg/dL — ABNORMAL HIGH (ref 83–199)

## 2021-06-06 MED ORDER — AMINOLEVULINIC ACID HCL 20 % EX SOLR
1.0000 "application " | Freq: Once | CUTANEOUS | Status: AC
  Administered 2021-06-06: 354 mg via TOPICAL

## 2021-06-06 NOTE — Telephone Encounter (Signed)
Pt is calling about previous message 

## 2021-06-06 NOTE — Progress Notes (Signed)
Patient completed PDT therapy today. ? ?1. AK (actinic keratosis) (3) ?Chest - Medial [facility]); Left Breast; Right Breast ? ?Photodynamic therapy - Chest - Medial [facility]), Left Breast, Right Breast ?Procedure discussed: discussed risks, benefits, side effects. and alternatives   ?Prep: site scrubbed/prepped with acetone   ?Location:  Chest ?Number of lesions:  Multiple ?Type of treatment:  Blue light ?Aminolevulinic Acid (see MAR for details): Levulan ?Number of Levulan sticks used:  1 ?Incubation time (minutes):  120 ?Number of minutes under lamp:  16 ?Number of seconds under lamp:  40 ?Cooling:  Floor fan ?Outcome: patient tolerated procedure well with no complications   ?Post-procedure details: sunscreen applied   ? ?Aminolevulinic Acid HCl 20 % SOLR 354 mg - Chest - Medial [facility]), Left Breast, Right Breast ? ? ?Patient provided samples of Solbar Sunscreen and Insurance underwriter.  ? ?Johnsie Kindred, RMA ? ?Documentation: I have reviewed the above documentation for accuracy and completeness, and I agree with the above. ? ?Brendolyn Patty MD  ?

## 2021-06-06 NOTE — Telephone Encounter (Signed)
Spoke with pt to let her know that she can not take tylenol until her liver enzymes have normalized. Also scheduled pt for a lab appt tomorrow and virtual visit on Wednesday per verbal from Dr. Derrel Nip.  ?

## 2021-06-06 NOTE — Telephone Encounter (Signed)
Patient called and would like a phone call back, she would like to know when should she do labs, when she can start taking Tylenol and when will she start to feel better. ?

## 2021-06-06 NOTE — Telephone Encounter (Signed)
Pt called back and stated that mychart is not working for her right now. She stated that she would like to know when she needs to have the hepatic panel repeated, if she can take tylenol. Pt also stated that she has had a low grade fever and when she went to the restroom this morning her urine was very dark. Should I schedule pt for a same day appt tomorrow morning to discuss?  ?

## 2021-06-06 NOTE — Patient Instructions (Signed)

## 2021-06-07 ENCOUNTER — Other Ambulatory Visit: Payer: Self-pay

## 2021-06-07 ENCOUNTER — Other Ambulatory Visit (INDEPENDENT_AMBULATORY_CARE_PROVIDER_SITE_OTHER): Payer: Medicare HMO

## 2021-06-07 ENCOUNTER — Other Ambulatory Visit: Payer: Self-pay | Admitting: Internal Medicine

## 2021-06-07 ENCOUNTER — Telehealth: Payer: Medicare HMO

## 2021-06-07 DIAGNOSIS — R748 Abnormal levels of other serum enzymes: Secondary | ICD-10-CM | POA: Diagnosis not present

## 2021-06-07 NOTE — Telephone Encounter (Signed)
LMTCB

## 2021-06-07 NOTE — Addendum Note (Signed)
Addended by: Crecencio Mc on: 06/07/2021 09:55 AM ? ? Modules accepted: Orders ? ?

## 2021-06-08 ENCOUNTER — Telehealth (INDEPENDENT_AMBULATORY_CARE_PROVIDER_SITE_OTHER): Payer: Medicare HMO | Admitting: Internal Medicine

## 2021-06-08 ENCOUNTER — Ambulatory Visit: Payer: Self-pay | Admitting: Pharmacist

## 2021-06-08 ENCOUNTER — Encounter: Payer: Self-pay | Admitting: Internal Medicine

## 2021-06-08 DIAGNOSIS — R748 Abnormal levels of other serum enzymes: Secondary | ICD-10-CM | POA: Diagnosis not present

## 2021-06-08 LAB — URINALYSIS, ROUTINE W REFLEX MICROSCOPIC
Bilirubin Urine: NEGATIVE
Hgb urine dipstick: NEGATIVE
Ketones, ur: NEGATIVE
Leukocytes,Ua: NEGATIVE
Nitrite: NEGATIVE
RBC / HPF: NONE SEEN (ref 0–?)
Specific Gravity, Urine: 1.015 (ref 1.000–1.030)
Total Protein, Urine: NEGATIVE
Urine Glucose: NEGATIVE
Urobilinogen, UA: 0.2 (ref 0.0–1.0)
pH: 7 (ref 5.0–8.0)

## 2021-06-08 LAB — HEPATIC FUNCTION PANEL
ALT: 253 U/L — ABNORMAL HIGH (ref 0–35)
AST: 132 U/L — ABNORMAL HIGH (ref 0–37)
Albumin: 4.3 g/dL (ref 3.5–5.2)
Alkaline Phosphatase: 960 U/L — ABNORMAL HIGH (ref 39–117)
Bilirubin, Direct: 0.1 mg/dL (ref 0.0–0.3)
Total Bilirubin: 0.5 mg/dL (ref 0.2–1.2)
Total Protein: 6.9 g/dL (ref 6.0–8.3)

## 2021-06-08 LAB — SEDIMENTATION RATE: Sed Rate: 20 mm/hr (ref 0–30)

## 2021-06-08 LAB — C-REACTIVE PROTEIN: CRP: 1.6 mg/dL (ref 0.5–20.0)

## 2021-06-08 NOTE — Assessment & Plan Note (Signed)
ETIOLOGY MAY BE DRUG INDUCED given normal ESR, CRP and autoimmune labs. Ultrasound also normal.  Tylenol use noted,  Level was < 10..  Was also taking lipitor and celebrex.  Until recently. Urgent gi referral made to Dr Allen Norris ?

## 2021-06-08 NOTE — Chronic Care Management (AMB) (Signed)
?  Chronic Care Management  ? ?Note ? ?06/08/2021 ?Name: KEYOSHA TIEDT MRN: [id number] DOB: 11-18-1950 ? ? ? ?Closing pharmacy CCM case at this time.  Patient has clinic contact information for future questions or concerns.  ? ?Catie Darnelle Maffucci, PharmD, [place], CPP ?Clinical Pharmacist ?Therapist, music at [employer] ?[phone] ? ?

## 2021-06-08 NOTE — Progress Notes (Signed)
Virtual Visit via [place] Note  This visit type was conducted due to national recommendations for restrictions regarding the COVID-19 pandemic (e.g. social distancing).  This format is felt to be most appropriate for this patient at this time.  All issues noted in this document were discussed and addressed.  No physical exam was performed (except for noted visual exam findings with Video Visits).   I attempted to connect withNAME@ on 06/08/21 at  4:45 PM EST by a video enabled telemedicine application  and verified that I am speaking with the correct person using two identifiers. Location patient: home Location provider: work or home office Persons participating in the virtual visit: patient, provider  I discussed the limitations, risks, security and privacy concerns of performing an evaluation and management service by telephone and the availability of in person appointments. I also discussed with the patient that there may be a patient responsible charge related to this service. The patient expressed understanding and agreed to proceed.  Interactive audio and video telecommunications were attempted between this provider and patient, however failed, due to patient having technical difficulties OR patient did not have access to video capability.  We continued and completed visit with audio only.   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 Until today she has been asymptomatic but very anxious about the etiology.  Today at around noon she developed chest pressure and a stabbing pain between her shoulder blades.  No diaphoresis , dypsnea or nausea.  BP was also elevated at 164/97 .  She took a SL nitroglycerin which has moderately relieved her  pin (currently a 1-2 in  severity).  She  stopped the atorvastatin and tylenol last week and was directed to stop the celebrex today , but has taken one dose this morning.  Labs reviewed most recent march 7 :  AST/ALT and trending down,  Alk phos has increased from 650 to 960.  ESR and CRP normal.   Liver US: June 02 2021:  cholelithiasis without cholecystitis :  1.3 cm non obstructing gallstone   CBD 4 mm.     ROS: See pertinent positives and negatives per HPI.  Past Medical History:  Diagnosis Date   Arthritis    back, knees, fingers   Bipolar disorder ([place])    Bunion, left    Depression    Diabetes mellitus    diet controlled.    Hypertension    Lower back pain    S/P fall   Squamous cell skin cancer, face    [facility]   Treadmill stress test negative for angina pectoris June 2013    Past Surgical History:  Procedure Laterality Date   BLADDER AUGMENTATION     BLADDER SURGERY     CARDIAC CATHETERIZATION  03/05/12   no stents   CATARACT EXTRACTION W/PHACO Right 07/19/2015   Procedure: CATARACT EXTRACTION PHACO AND INTRAOCULAR LENS PLACEMENT ([place]) right eye;  Surgeon: Ronnell Freshwater, MD;  Location: [place];  Service: Ophthalmology;  Laterality: Right;  BORDERLINE DIABETIC - oral meds   CATARACT EXTRACTION W/PHACO Left 05/28/2017   Procedure: CATARACT EXTRACTION PHACO AND INTRAOCULAR LENS PLACEMENT ([place]) LEFT DIABETIC;  Surgeon: Leandrew Koyanagi, MD;  Location: [place];  Service: Ophthalmology;  Laterality: Left;  Diabetic - oal meds   JOINT REPLACEMENT  2012   bilateral hip   KNEE ARTHROSCOPY     TOTAL HIP ARTHROPLASTY  2012   TOTAL KNEE ARTHROPLASTY Right 06/04/2017   Procedure: TOTAL KNEE ARTHROPLASTY;  Surgeon: Lovell Sheehan, MD;  Location: [facility];  Service: Orthopedics;  Laterality: Right;   TUBAL LIGATION     VAGINAL DELIVERY     2    Family History  Problem Relation Age of Onset   Heart disease Mother    Stroke Mother    Heart disease Father     Dementia Father    Heart disease Maternal Grandmother    Heart disease Maternal Grandfather    Heart disease Paternal Grandmother    Heart disease Paternal Grandfather    Breast cancer Maternal Aunt     SOCIAL HX:  reports that she has never smoked. She has never used smokeless tobacco. She reports current alcohol use of about 1.0 standard drink per week. She reports that she does not use drugs.    Current Outpatient Medications:    aspirin EC 81 MG tablet, Take 81 mg by mouth daily. Swallow whole., Disp: , Rfl:    Biotin 5 MG CAPS, Take 2 capsules by mouth 2 (two) times daily. , Disp: , Rfl:    blood glucose meter kit and supplies, Dispense Contour next bayer meter. E11.9 To check blood glucose  Once a day., Disp: 1 each, Rfl: 0   buPROPion (WELLBUTRIN XL) 300 MG 24 hr tablet, TAKE ONE TABLET BY MOUTH DAILY, Disp: 30 tablet, Rfl: 1   Cholecalciferol (VITAMIN D3) 1000 units CAPS, Take 1,000 Units by mouth daily. , Disp: , Rfl:    cyanocobalamin (,VITAMIN B-12,) 1000 MCG/ML injection, INJECT 1 ML INTO THE MUSCLE EVERY 30 DAYS, Disp: 3 mL, Rfl: 1   diphenhydramine-acetaminophen (TYLENOL PM) 25-500 MG TABS tablet, Take 1 tablet by mouth at bedtime as needed., Disp: , Rfl:    donepezil (ARICEPT) 10 MG tablet, Take 10 mg by mouth at bedtime., Disp: , Rfl:    DULoxetine 40 MG CPEP, Take 40 mg by mouth daily., Disp: 30 capsule, Rfl: 2   glucose blood (FREESTYLE LITE) test strip, Use once daily  as instructed to test blood sugar E 11.9, Disp: 100 each, Rfl: 3   lamoTRIgine (LAMICTAL) 200 MG tablet, TAKE ONE TABLET BY MOUTH TWICE A DAY, Disp: 180 tablet, Rfl: 1   Lancets (FREESTYLE) lancets, Use once daily to check blood sugars  E11.9, Disp: 100 each, Rfl: 3   losartan (COZAAR) 50 MG tablet, Take 1 tablet (50 mg total) by mouth daily., Disp: 90 tablet, Rfl: 3   nitroGLYCERIN (NITROSTAT) 0.4 MG SL tablet, Place 1 tablet (0.4 mg total) under the tongue every 5 (five) minutes as needed for chest pain.,  Disp: 50 tablet, Rfl: 3   omeprazole (PRILOSEC) 40 MG capsule, TAKE ONE CAPSULE BY MOUTH DAILY, Disp: 90 capsule, Rfl: 3   Semaglutide,0.25 or 0.5MG/DOS, (OZEMPIC, 0.25 OR 0.5 MG/DOSE,) 2 MG/1.5ML SOPN, Inject 0.5 mg into the skin once a week. Inject 0.25 mg weekly for 4 weeks then increase to 0.5 mg weekly, Disp: 1.5 mL, Rfl: 0   Syringe, Disposable, 1 ML MISC, For use with B12 injections, Disp: 25 each, Rfl: 3   temazepam (RESTORIL) 22.5 MG capsule, TAKE ONE CAPSULE BY MOUTH EVERY NIGHT AT BEDTIME AS NEEDED FOR SLEEP, Disp: 30 capsule, Rfl: 3  EXAM:   General impression: alert, cooperative and articulate.  No signs  of being in distress  Lungs: speech is fluent sentence length suggests that patient is not short of breath and not punctuated by cough, sneezing or sniffing. Marland Kitchen   Psych: affect normal.  speech is articulate and non pressured .  Denies suicidal thoughts   ASSESSMENT AND PLAN:  Discussed the following assessment and plan:  Elevated liver enzymes  Elevated liver enzymes Etiology unclear. Suspect medication s/e given negative serologies for autoimmune disease and hepatitis.  [place] noted cholelithiasis without obstruction,  CBD 4 mm.  No fatty liver.  However,  Repeat enzymes from March 7 note  alk phos is still rising,  AST/ALT trending down and she reports an episode of chest pain, radiate to shoulder blades,  So need to rule out choledocholithiasis .  Advised patient to go to ER  Tonight ; she has agreed to go if her pain returns.  She has an appt with Dr Allen Norris tomorrow , assuming she does not go to ER [place]     I discussed the assessment and treatment plan with the patient. The patient was provided an opportunity to ask questions and all were answered. The patient agreed with the plan and demonstrated an understanding of the instructions.   The patient was advised to call back or seek an in-person evaluation if the symptoms worsen or if the condition fails to improve as  anticipated.   I spent 25 minutes dedicated to the care of this patient on the date of this encounter to include pre-visit review of his medical history,  non  Face-to-face time with the patient , and post visit ordering of testing and therapeutics.    Crecencio Mc, MD

## 2021-06-08 NOTE — Addendum Note (Signed)
Addended by: Crecencio Mc on: 06/08/2021 01:18 PM ? ? Modules accepted: Orders ? ?

## 2021-06-08 NOTE — Assessment & Plan Note (Signed)
Etiology unclear. Suspect medication s/e given negative serologies for autoimmune disease and hepatitis.  [place] noted cholelithiasis without obstruction,  CBD 4 mm.  No fatty liver.  However,  Repeat enzymes from March 7 note  alk phos is still rising,  AST/ALT trending down and she reports an episode of chest pain, radiate to shoulder blades,  So need to rule out choledocholithiasis .  Advised patient to go to ER  Tonight ; she has agreed to go if her pain returns.  She has an appt with Dr Allen Norris tomorrow , assuming she does not go to ER [place]  ?

## 2021-06-08 NOTE — Addendum Note (Signed)
Addended by: Crecencio Mc on: 06/08/2021 01:11 PM ? ? Modules accepted: Orders ? ?

## 2021-06-09 ENCOUNTER — Ambulatory Visit (INDEPENDENT_AMBULATORY_CARE_PROVIDER_SITE_OTHER): Payer: Medicare HMO | Admitting: Gastroenterology

## 2021-06-09 ENCOUNTER — Other Ambulatory Visit: Payer: Self-pay

## 2021-06-09 ENCOUNTER — Ambulatory Visit (INDEPENDENT_AMBULATORY_CARE_PROVIDER_SITE_OTHER): Payer: Medicare HMO | Admitting: Dermatology

## 2021-06-09 ENCOUNTER — Encounter: Payer: Self-pay | Admitting: Gastroenterology

## 2021-06-09 ENCOUNTER — Telehealth: Payer: Self-pay

## 2021-06-09 VITALS — BP 139/86 | HR 102 | Temp 97.9°F | Ht 60.0 in | Wt 145.0 lb

## 2021-06-09 DIAGNOSIS — R748 Abnormal levels of other serum enzymes: Secondary | ICD-10-CM

## 2021-06-09 DIAGNOSIS — L570 Actinic keratosis: Secondary | ICD-10-CM

## 2021-06-09 MED ORDER — MOMETASONE FUROATE 0.1 % EX CREA: 1.0000 "application " | TOPICAL_CREAM | Freq: Two times a day (BID) | CUTANEOUS | 0 refills | Status: DC | PRN

## 2021-06-09 NOTE — Patient Instructions (Addendum)
Patient has had a significant reaction to the photodynamic therapy due to sun damage. Advised this is not shingles because this area has crossed the midline and reaction is center of chest.  ? ?Recommended to take OTC pain reliever and anti inflammatory, with the OK from PCP.  ? ?Start cold compresses as needed to heat for chest after applying Mometasone Cream.  ? ?Start Mometasone Cream twice daily to chest as needed, until healed.  ? ?Start CeraVe Cream or Lotion with Pramoxine (Itch relief moisturizing cream/lotion) as needed. Apply after Mometasone has dried in.  ?

## 2021-06-09 NOTE — Progress Notes (Signed)
Gastroenterology Consultation  Referring Provider:     Crecencio Mc, MD Primary Care Physician:  Crecencio Mc, MD Primary Gastroenterologist:  Dr. Allen Norris     Reason for Consultation:     Abnormal liver enzymes        HPI:   Katherine Jimenez is a 71 y.o. y/o female referred for consultation & management of abnormal liver enzymes by Dr. Derrel Nip, Aris Everts, MD. This patient comes in to see me today after being found last month to have abnormal liver enzymes delineated below.  The patient then had repeat LFTs that showed the AST and ALT have improved with the alkaline phosphatase has increased from 650 up to 960.  The patient has had blood work which has been negative for ANA, SMA, AMA and ceruloplasmin.  The acute hepatitis panel was also negative.  The patient had an ultrasound without any stones and a common bile duct that was 4 mm.  The patient has been having some episodes of chest pain that have radiated to her shoulder blades. The patient was taking atorvastatin and Tylenol but stopped it last week.  The patient was still taking Celebrex but was told to stop that yesterday.   Component     Latest Ref Rng & Units 05/12/2021 05/30/2021 06/07/2021  Total Bilirubin     0.2 - 1.2 mg/dL 0.4 0.6 0.5  Bilirubin, Direct     0.0 - 0.3 mg/dL   0.1  Alkaline Phosphatase     39 - 117 U/L 100 650 (H) 960 (H)  AST     0 - 37 U/L 14 204 (H) 132 (H)  ALT     0 - 35 U/L 15 422 (H) 253 (H)   The patient reports that she continues to have a lot of pain in her back although she states that she gets injections in her back and had one recently so she is not sure why her back pain is so much worse now.  Past Medical History:  Diagnosis Date   Arthritis    back, knees, fingers   Bipolar disorder ([place])    Bunion, left    Depression    Diabetes mellitus    diet controlled.    Hypertension    Lower back pain    S/P fall   Squamous cell skin cancer, face    [facility]   Treadmill stress test negative  for angina pectoris June 2013    Past Surgical History:  Procedure Laterality Date   BLADDER AUGMENTATION     BLADDER SURGERY     CARDIAC CATHETERIZATION  03/05/12   no stents   CATARACT EXTRACTION W/PHACO Right 07/19/2015   Procedure: CATARACT EXTRACTION PHACO AND INTRAOCULAR LENS PLACEMENT ([place]) right eye;  Surgeon: Ronnell Freshwater, MD;  Location: [place];  Service: Ophthalmology;  Laterality: Right;  BORDERLINE DIABETIC - oral meds   CATARACT EXTRACTION W/PHACO Left 05/28/2017   Procedure: CATARACT EXTRACTION PHACO AND INTRAOCULAR LENS PLACEMENT ([place]) LEFT DIABETIC;  Surgeon: Leandrew Koyanagi, MD;  Location: [place];  Service: Ophthalmology;  Laterality: Left;  Diabetic - oal meds   JOINT REPLACEMENT  2012   bilateral hip   KNEE ARTHROSCOPY     TOTAL HIP ARTHROPLASTY  2012   TOTAL KNEE ARTHROPLASTY Right 06/04/2017   Procedure: TOTAL KNEE ARTHROPLASTY;  Surgeon: Lovell Sheehan, MD;  Location: [facility];  Service: Orthopedics;  Laterality: Right;   TUBAL LIGATION     VAGINAL  DELIVERY     2    Prior to Admission medications   Medication Sig Start Date End Date Taking? Authorizing Provider  aspirin EC 81 MG tablet Take 81 mg by mouth daily. Swallow whole.    [provider]  Biotin 5 MG CAPS Take 2 capsules by mouth 2 (two) times daily.     [provider]  blood glucose meter kit and supplies Dispense Contour next bayer meter. E11.9 To check blood glucose  Once a day. 08/31/15   Crecencio Mc, MD  buPROPion (WELLBUTRIN XL) 300 MG 24 hr tablet TAKE ONE TABLET BY MOUTH DAILY 05/03/21   Crecencio Mc, MD  Cholecalciferol (VITAMIN D3) 1000 units CAPS Take 1,000 Units by mouth daily.     [provider]  cyanocobalamin (,VITAMIN B-12,) 1000 MCG/ML injection INJECT 1 ML INTO THE MUSCLE EVERY 30 DAYS 05/03/21   Crecencio Mc, MD  diphenhydramine-acetaminophen (TYLENOL PM) 25-500 MG TABS tablet Take 1 tablet by mouth at bedtime as  needed.    [provider]  donepezil (ARICEPT) 10 MG tablet Take 10 mg by mouth at bedtime.    [provider]  DULoxetine 40 MG CPEP Take 40 mg by mouth daily. 05/30/21   Crecencio Mc, MD  glucose blood (FREESTYLE LITE) test strip Use once daily  as instructed to test blood sugar E 11.9 04/29/14   Crecencio Mc, MD  lamoTRIgine (LAMICTAL) 200 MG tablet TAKE ONE TABLET BY MOUTH TWICE A DAY 12/10/20   Crecencio Mc, MD  Lancets (FREESTYLE) lancets Use once daily to check blood sugars  E11.9 04/29/14   Crecencio Mc, MD  losartan (COZAAR) 50 MG tablet Take 1 tablet (50 mg total) by mouth daily. 05/30/21   Crecencio Mc, MD  mometasone (ELOCON) 0.1 % cream Apply 1 application. topically 2 (two) times daily as needed (to chest). 06/09/21   Brendolyn Patty, MD  nitroGLYCERIN (NITROSTAT) 0.4 MG SL tablet Place 1 tablet (0.4 mg total) under the tongue every 5 (five) minutes as needed for chest pain. 04/06/21   Crecencio Mc, MD  omeprazole (PRILOSEC) 40 MG capsule TAKE ONE CAPSULE BY MOUTH DAILY 01/10/21   Crecencio Mc, MD  Semaglutide,0.25 or 0.5MG/DOS, (OZEMPIC, 0.25 OR 0.5 MG/DOSE,) 2 MG/1.5ML SOPN Inject 0.5 mg into the skin once a week. Inject 0.25 mg weekly for 4 weeks then increase to 0.5 mg weekly 02/10/21   Crecencio Mc, MD  Syringe, Disposable, 1 ML MISC For use with B12 injections 12/23/14   Crecencio Mc, MD  temazepam (RESTORIL) 22.5 MG capsule TAKE ONE CAPSULE BY MOUTH EVERY NIGHT AT BEDTIME AS NEEDED FOR SLEEP 04/01/21   Crecencio Mc, MD    Family History  Problem Relation Age of Onset   Heart disease Mother    Stroke Mother    Heart disease Father    Dementia Father    Heart disease Maternal Grandmother    Heart disease Maternal Grandfather    Heart disease Paternal Grandmother    Heart disease Paternal Grandfather    Breast cancer Maternal Aunt      Social History   Tobacco Use   Smoking status: Never   Smokeless tobacco: Never  Vaping Use    Vaping Use: Never used  Substance Use Topics   Alcohol use: Yes    Alcohol/week: 1.0 standard drink    Types: 1 Cans of beer per week    Comment: occassionally   Drug  use: No    Allergies as of 06/09/2021 - Review Complete 06/08/2021  Allergen Reaction Noted   Thimerosal Itching 07/12/2015    Review of Systems:    All systems reviewed and negative except where noted in HPI.   Physical Exam:  There were no vitals taken for this visit. No LMP recorded. Patient is postmenopausal. General:   Alert,  Well-developed, well-nourished, pleasant and cooperative in NAD Head:  Normocephalic and atraumatic. Eyes:  Sclera clear, no icterus.   Conjunctiva pink. Ears:  Normal auditory acuity. Neck:  Supple; no masses or thyromegaly. Lungs:  Respirations even and unlabored.  Clear throughout to auscultation.   No wheezes, crackles, or rhonchi. No acute distress. Heart:  Regular rate and rhythm; no murmurs, clicks, rubs, or gallops. Abdomen:  Normal bowel sounds.  No bruits.  Soft, non-tender and non-distended without masses, hepatosplenomegaly or hernias noted.  No guarding or rebound tenderness.  Negative Carnett sign.   Rectal:  Deferred.  Pulses:  Normal pulses noted. Extremities:  No clubbing or edema.  No cyanosis. Neurologic:  Alert and oriented x3;  grossly normal neurologically. Skin:  Intact without significant lesions or rashes.  No jaundice. Lymph Nodes:  No significant cervical adenopathy. Psych:  Alert and cooperative. Normal mood and affect.  Imaging Studies: CT CORONARY MORPH W/CTA COR W/SCORE W/CA W/CM &/OR WO/CM  Addendum Date: 05/26/2021   ADDENDUM REPORT: 05/26/2021 15:15 EXAM: OVER-READ INTERPRETATION  CT CHEST The following report is an over-read performed by radiologist Dr. Eben Burow [facility] on 05/26/2021. This over-read does not include interpretation of cardiac or coronary anatomy or pathology. The coronary calcium score/coronary CTA interpretation by  the cardiologist is attached. COMPARISON:  None. FINDINGS: No suspicious nodules, masses, or infiltrates are identified in the visualized portion of the lungs. No pleural fluid seen. The visualized portions of the mediastinum and chest wall are unremarkable. A small hiatal hernia is noted. IMPRESSION: Small hiatal hernia. Electronically Signed   By: Marlaine Hind M.D.   On: 05/26/2021 15:15   Result Date: 05/26/2021 CLINICAL DATA:  Chest pain EXAM: Cardiac/Coronary  CTA TECHNIQUE: The patient was scanned on a Siemens Somatom go.Top scanner. : A retrospective scan was triggered in the descending thoracic aorta. Axial non-contrast 3 mm slices were carried out through the heart. The data set was analyzed on a dedicated work station and scored using the [place]. Gantry rotation speed was 330 msecs and collimation was .6 mm. 146m of metoprolol and 0.8 mg of sl NTG was given. The 3D data set was reconstructed in 5% intervals of the 60-95 % of the R-R cycle. Diastolic phases were analyzed on a dedicated work station using MPR, MIP and VRT modes. The patient received 75 cc of contrast. FINDINGS: Aorta: Normal size. Mild aortic root and descending aorta calcifications. No dissection. Aortic Valve:  Trileaflet.  No calcifications. Coronary Arteries:  Normal coronary origin.  Right dominance. RCA is a dominant artery that gives rise to PDA and PLA. There is mild calcified plaque in the mid LAD causing minimal stenosis (<25%). Left main is a large artery that gives rise to LAD and LCX arteries. There is no LM disease LAD has calcified and non calcified plaque in the proximal segment causing mild stenosis (25%). LCX is a non-dominant artery that gives rise to two obtuse marginal branches. There is no plaque. Other findings: Normal pulmonary vein drainage into the left atrium. Normal left atrial appendage without a thrombus. Normal size of the pulmonary artery. IMPRESSION:  1. Coronary calcium score of 62.4. This was 68th  percentile for age and sex matched control. 2. Normal coronary origin with right dominance. 3. Mild proximal LAD stenosis (25%). 4. Minimal mid RCA disease (<25%) 5. CAD-RADS 2. Mild non-obstructive CAD (25-49%). Consider non-atherosclerotic causes of chest pain. Consider preventive therapy and risk factor modification. Electronically Signed: By: Kate Sable M.D. On: 05/26/2021 14:05   US Abdomen Limited RUQ (LIVER/GB)  Result Date: 06/02/2021 CLINICAL DATA:  Elevated liver enzymes EXAM: ULTRASOUND ABDOMEN LIMITED RIGHT UPPER QUADRANT COMPARISON:  CT abdomen/pelvis 08/19/2018 FINDINGS: Gallbladder: Shadowing gallstones are seen measuring up to 1.3 cm. There is no gallbladder wall thickening or pericholecystic fluid. Sonographic Percell Miller sign was reported negative. Common bile duct: Diameter: 4 mm. Liver: No focal lesion identified. Within normal limits in parenchymal echogenicity. Portal vein is patent on color Doppler imaging with normal direction of blood flow towards the liver. Other: None. IMPRESSION: Cholelithiasis without evidence of acute cholecystitis. Electronically Signed   By: Valetta Mole M.D.   On: 06/02/2021 10:16    Assessment and Plan:   Katherine Jimenez is a 71 y.o. y/o female who comes in today with abnormal liver enzymes.  The patient's liver enzymes have gone down but the Alkaline phosphatase has gone up. Her liver enzymes may be elevated due to the multiple medications she is been taking and these have been stopped.  The patient will have her labs checked Today and again in a week.  If the liver enzymes are going up or felt to come down then the patient may need a MRCP to rule out Malcolm as a possible cause of her abnormal liver enzymes.  She has also been told that if this is not diagnostic she may need a liver biopsy.  The patient has been explained the plan and agrees with it.    Lucilla Lame, MD. Marval Regal    Note: This dictation was prepared with Dragon dictation along with smaller  phrase technology. Any transcriptional errors that result from this process are unintentional.

## 2021-06-09 NOTE — Telephone Encounter (Signed)
Patient here this morning from reaction to PDT treatment on Monday 06/06/21. Per Dr. Nicole Kindred OK to send in Mometasone BID to chest as needed/until healed.  ?

## 2021-06-09 NOTE — Telephone Encounter (Signed)
Labs were discussed with pt during her visit with Dr. Derrel Nip yesterday evening.  ?

## 2021-06-09 NOTE — Progress Notes (Signed)
Patient here today regarding reaction to PDT on Monday 06/06/21. Patient states the area is painful to touch and changes from heat to feeling chills. Patient denies any sun or bright light to area since having phototherapy done. She is having current liver issues due to high dose of cholesterol medication and scheduled to see a GI doctor this afternoon, referral from her PCP.  Patient would like Dr. Nicole Kindred to look at her chest to rule out shingles.  ? ?PE diffuse erythematous macules mid chest crosses midline, tender to touch, no vesicles ? ?Patient has had a significant reaction to the photodynamic therapy due to sun damage. Advised this is not shingles because the affected area has crossed the midline and reaction is center of chest exactly where she has the sun damage and was treated with PDT.  ? ?Recommended to take OTC pain reliever/anti inflammatory, with the OK from PCP.  ?Start cold compresses as needed for chest before applying Mometasone Cream.  ?Start Mometasone Cream BID PRN to chest as needed, until healed.  ?Start CeraVe Cream or Lotion with Pramoxine (Itch relief moisturizing cream/lotion) as needed.  ? ?Photo taken and placed in media.  ? ?I, Johnsie Kindred, RMA, am acting as scribe for Brendolyn Patty, MD.  ? ?Documentation: I have reviewed the above documentation for accuracy and completeness, and I agree with the above. ? ?Brendolyn Patty MD  ?

## 2021-06-10 ENCOUNTER — Telehealth: Payer: Self-pay | Admitting: *Deleted

## 2021-06-10 ENCOUNTER — Encounter: Payer: Self-pay | Admitting: Internal Medicine

## 2021-06-10 ENCOUNTER — Telehealth: Payer: Self-pay | Admitting: Internal Medicine

## 2021-06-10 ENCOUNTER — Telehealth: Payer: Self-pay | Admitting: Gastroenterology

## 2021-06-10 DIAGNOSIS — R748 Abnormal levels of other serum enzymes: Secondary | ICD-10-CM

## 2021-06-10 LAB — HEPATIC FUNCTION PANEL
ALT: 176 IU/L — ABNORMAL HIGH (ref 0–32)
AST: 59 IU/L — ABNORMAL HIGH (ref 0–40)
Albumin: 4.6 g/dL (ref 3.8–4.8)
Alkaline Phosphatase: 1009 IU/L (ref 44–121)
Bilirubin Total: 0.3 mg/dL (ref 0.0–1.2)
Bilirubin, Direct: 0.13 mg/dL (ref 0.00–0.40)
Total Protein: 7.1 g/dL (ref 6.0–8.5)

## 2021-06-10 NOTE — Telephone Encounter (Signed)
Patient called and wanted to to speak to a nurse. Refused to state why, she said it was personal. After speaking with Office supervisor she stated she would send a MyChart message. ?

## 2021-06-10 NOTE — Telephone Encounter (Signed)
Received mychart message ?

## 2021-06-10 NOTE — Telephone Encounter (Signed)
Patient would like p ?

## 2021-06-10 NOTE — Telephone Encounter (Signed)
Please place future orders for lab appt.  

## 2021-06-10 NOTE — Telephone Encounter (Signed)
Patient requesting to talk to DR Allen Norris or his nurse about lab results.  ?

## 2021-06-10 NOTE — Addendum Note (Signed)
Addended by: Lurlean Nanny on: 06/10/2021 11:52 AM ? ? Modules accepted: Orders ? ?

## 2021-06-13 NOTE — Addendum Note (Signed)
Addended by: Lurlean Nanny on: 06/13/2021 12:42 PM ? ? Modules accepted: Orders ? ?

## 2021-06-16 ENCOUNTER — Other Ambulatory Visit: Payer: Self-pay

## 2021-06-16 ENCOUNTER — Other Ambulatory Visit (INDEPENDENT_AMBULATORY_CARE_PROVIDER_SITE_OTHER): Payer: Medicare HMO

## 2021-06-16 DIAGNOSIS — R748 Abnormal levels of other serum enzymes: Secondary | ICD-10-CM | POA: Diagnosis not present

## 2021-06-16 LAB — HEPATIC FUNCTION PANEL
ALT: 27 U/L (ref 0–35)
AST: 15 U/L (ref 0–37)
Albumin: 4.2 g/dL (ref 3.5–5.2)
Alkaline Phosphatase: 339 U/L — ABNORMAL HIGH (ref 39–117)
Bilirubin, Direct: 0 mg/dL (ref 0.0–0.3)
Total Bilirubin: 0.3 mg/dL (ref 0.2–1.2)
Total Protein: 7.5 g/dL (ref 6.0–8.3)

## 2021-06-17 ENCOUNTER — Telehealth: Payer: Self-pay | Admitting: Internal Medicine

## 2021-06-17 NOTE — Telephone Encounter (Signed)
Spoke with pt and she stated that she could not get  back to the lab results on mychart and she would like to know what it said. Read mychart message to pt. Pt gave a verbal understanding.  ?

## 2021-06-17 NOTE — Telephone Encounter (Signed)
Pt called about labs and have questions about them ?

## 2021-06-17 NOTE — Telephone Encounter (Signed)
Pt would like to speak with Dr Patrina Levering nurse about lab results. ?

## 2021-06-19 ENCOUNTER — Other Ambulatory Visit: Payer: Self-pay | Admitting: Internal Medicine

## 2021-06-21 ENCOUNTER — Telehealth: Payer: Self-pay | Admitting: Gastroenterology

## 2021-06-21 NOTE — Telephone Encounter (Signed)
Patient would like to go over lab results and see what the next step is. Requesting call back. ?

## 2021-06-21 NOTE — Telephone Encounter (Signed)
Pt is aware that Dr Allen Norris has not interpreted results at this time  ?

## 2021-06-29 ENCOUNTER — Other Ambulatory Visit: Payer: Self-pay | Admitting: Internal Medicine

## 2021-06-30 NOTE — Telephone Encounter (Signed)
Patient requesting the nurse call her. No further information was given. ?

## 2021-06-30 NOTE — Telephone Encounter (Signed)
error 

## 2021-07-01 DIAGNOSIS — M1712 Unilateral primary osteoarthritis, left knee: Secondary | ICD-10-CM | POA: Diagnosis not present

## 2021-07-04 ENCOUNTER — Ambulatory Visit (INDEPENDENT_AMBULATORY_CARE_PROVIDER_SITE_OTHER): Payer: Medicare HMO | Admitting: Internal Medicine

## 2021-07-04 ENCOUNTER — Encounter: Payer: Self-pay | Admitting: Internal Medicine

## 2021-07-04 VITALS — BP 130/80 | HR 84 | Temp 98.4°F | Ht 60.0 in | Wt 145.4 lb

## 2021-07-04 DIAGNOSIS — R748 Abnormal levels of other serum enzymes: Secondary | ICD-10-CM | POA: Diagnosis not present

## 2021-07-04 DIAGNOSIS — M2550 Pain in unspecified joint: Secondary | ICD-10-CM | POA: Diagnosis not present

## 2021-07-04 DIAGNOSIS — I700 Atherosclerosis of aorta: Secondary | ICD-10-CM

## 2021-07-04 DIAGNOSIS — E785 Hyperlipidemia, unspecified: Secondary | ICD-10-CM | POA: Diagnosis not present

## 2021-07-04 DIAGNOSIS — E1169 Type 2 diabetes mellitus with other specified complication: Secondary | ICD-10-CM

## 2021-07-04 DIAGNOSIS — I10 Essential (primary) hypertension: Secondary | ICD-10-CM

## 2021-07-04 LAB — BASIC METABOLIC PANEL
BUN: 20 mg/dL (ref 6–23)
CO2: 24 mEq/L (ref 19–32)
Calcium: 10.1 mg/dL (ref 8.4–10.5)
Chloride: 99 mEq/L (ref 96–112)
Creatinine, Ser: 0.73 mg/dL (ref 0.40–1.20)
GFR: 83.93 mL/min (ref >60.00)
Glucose, Bld: 140 mg/dL — ABNORMAL HIGH (ref 70–99)
Potassium: 4.4 mEq/L (ref 3.5–5.1)
Sodium: 133 mEq/L — ABNORMAL LOW (ref 135–145)

## 2021-07-04 LAB — MICROALBUMIN / CREATININE URINE RATIO
Creatinine,U: 106.1 mg/dL
Microalb Creat Ratio: 0.9 mg/g (ref 0.0–30.0)
Microalb, Ur: 1 mg/dL (ref 0.0–1.9)

## 2021-07-04 LAB — HEPATIC FUNCTION PANEL
ALT: 15 U/L (ref 0–35)
AST: 14 U/L (ref 0–37)
Albumin: 4.5 g/dL (ref 3.5–5.2)
Alkaline Phosphatase: 133 U/L — ABNORMAL HIGH (ref 39–117)
Bilirubin, Direct: 0.1 mg/dL (ref 0.0–0.3)
Total Bilirubin: 0.4 mg/dL (ref 0.2–1.2)
Total Protein: 6.9 g/dL (ref 6.0–8.3)

## 2021-07-04 LAB — HEMOGLOBIN A1C: Hgb A1c MFr Bld: 6.2 % (ref 4.6–6.5)

## 2021-07-04 MED ORDER — HYDROXYZINE PAMOATE 25 MG PO CAPS: 25.0000 mg | ORAL_CAPSULE | Freq: Three times a day (TID) | ORAL | 0 refills | Status: DC

## 2021-07-04 NOTE — Telephone Encounter (Signed)
Pt is aware as instructed and expressed understanding... Nothing further needed at this time ?

## 2021-07-04 NOTE — Progress Notes (Signed)
Subjective:  Patient ID: Katherine Jimenez, female    DOB: Aug 22, 1950  Age: 71 y.o. MRN: [id number]  CC: The primary encounter diagnosis was Elevated liver enzymes. Diagnoses of Thoracic aortic atherosclerosis ([place]), Primary hypertension, Polyarthralgia, and Hyperlipidemia associated with type 2 diabetes mellitus ([place]) were also pertinent to this visit.   This visit occurred during the SARS-CoV-2 public health emergency.  Safety protocols were in place, including screening questions prior to the visit, additional usage of staff PPE, and extensive cleaning of exam room while observing appropriate contact time as indicated for disinfecting solutions.    HPI Katherine Jimenez presents for  Chief Complaint  Patient presents with   Follow-up    F/u to discuss labs   Has had aching arms,  chills   this past weekend , lasted only 12 hours.   Reviewed last workup done during an episode:  CRP and ESR were normal.  Occurred 2 weeks AFTER liver enzyme was first noticed   DJD :  Had steroid injection left medial knee for severe DJD by Harlow Mares. TKY discussed in near future:  Doing water aerobics  as therapy   Has also seen Krasinsky for right shoulder arthritis.  Seeing Sowles for joint injection   Back pain unchanged.  Given methocarbamol, , no change.  Using ice and heat. Has resumed celebrex and tylenol for pain management as of 2 weeks ago.   Elevated ALT/AST/alk phos:  Resumed celebrex and tylenol 2 weeks ago.  Last liver enzymes checked March 16    Seeing podiatry Vickki Muff for left foot pain; does not want bunion removed   Chronic insomia:  aggravated by bipolar disorder. current dose of Restoril 22.5 mg daily is  too expensive  . Just refilled 22.5 mg dose. Sleep latency is variable . wants to get off it .   Adds tylenol PM  HTN: home BP readings have been 130/80 or less  on losartan 50 mg   DM with obesity:  down 12 ls since Oct 2022 . Taking Ozempic and metformin    Outpatient Medications  Prior to Visit  Medication Sig Dispense Refill   acetaminophen (TYLENOL) 325 MG tablet Take 650 mg by mouth every 6 (six) hours as needed.     aspirin EC 81 MG tablet Take 81 mg by mouth daily. Swallow whole.     Biotin 5 MG CAPS Take 2 capsules by mouth 2 (two) times daily.      blood glucose meter kit and supplies Dispense Contour next bayer meter. E11.9 To check blood glucose  Once a day. 1 each 0   buPROPion (WELLBUTRIN XL) 300 MG 24 hr tablet TAKE ONE TABLET BY MOUTH DAILY 30 tablet 1   celecoxib (CELEBREX) 200 MG capsule Take by mouth 2 (two) times daily.     Cholecalciferol (VITAMIN D3) 1000 units CAPS Take 1,000 Units by mouth daily.      cyanocobalamin (,VITAMIN B-12,) 1000 MCG/ML injection INJECT 1 ML INTO THE MUSCLE EVERY 30 DAYS 3 mL 1   diphenhydramine-acetaminophen (TYLENOL PM) 25-500 MG TABS tablet Take 1 tablet by mouth at bedtime as needed.     donepezil (ARICEPT) 5 MG tablet Take 5 mg by mouth daily.     DULoxetine 40 MG CPEP Take 40 mg by mouth daily. 30 capsule 2   glucose blood (FREESTYLE LITE) test strip Use once daily  as instructed to test blood sugar E 11.9 100 each 3   lamoTRIgine (LAMICTAL) 200 MG tablet TAKE ONE TABLET  BY MOUTH TWICE A DAY 180 tablet 1   Lancets (FREESTYLE) lancets Use once daily to check blood sugars  E11.9 100 each 3   losartan (COZAAR) 50 MG tablet Take 1 tablet (50 mg total) by mouth daily. 90 tablet 3   methocarbamol (ROBAXIN) 500 MG tablet methocarbamol 500 mg tablet  1 tab daily with food     nitroGLYCERIN (NITROSTAT) 0.4 MG SL tablet Place 1 tablet (0.4 mg total) under the tongue every 5 (five) minutes as needed for chest pain. 50 tablet 3   omeprazole (PRILOSEC) 40 MG capsule TAKE ONE CAPSULE BY MOUTH DAILY 90 capsule 3   Semaglutide,0.25 or 0.'5MG'$ /DOS, (OZEMPIC, 0.25 OR 0.5 MG/DOSE,) 2 MG/1.5ML SOPN Inject 0.5 mg into the skin once a week. Inject 0.25 mg weekly for 4 weeks then increase to 0.5 mg weekly 1.5 mL 0   Syringe, Disposable, 1 ML  MISC For use with B12 injections 25 each 3   temazepam (RESTORIL) 22.5 MG capsule TAKE ONE CAPSULE BY MOUTH EVERY NIGHT AT BEDTIME AS NEEDED FOR SLEEP 30 capsule 3   donepezil (ARICEPT) 10 MG tablet Take 10 mg by mouth at bedtime. (Patient not taking: Reported on 07/04/2021)     mometasone (ELOCON) 0.1 % cream Apply 1 application. topically 2 (two) times daily as needed (to chest). (Patient not taking: Reported on 07/04/2021) 45 g 0   No facility-administered medications prior to visit.    Review of Systems;  Patient denies headache, fevers, malaise, unintentional weight loss, skin rash, eye pain, sinus congestion and sinus pain, sore throat, dysphagia,  hemoptysis , cough, dyspnea, wheezing, chest pain, palpitations, orthopnea, edema, abdominal pain, nausea, melena, diarrhea, constipation, flank pain, dysuria, hematuria, urinary  Frequency, nocturia, numbness, tingling, seizures,  Focal weakness, Loss of consciousness,  Tremor, insomnia, depression, anxiety, and suicidal ideation.      Objective:  BP 130/80 (BP Location: Left Arm, Patient Position: Sitting, Cuff Size: Normal)   Pulse 84   Temp 98.4 F (36.9 C) (Oral)   Ht 5' (1.524 m)   Wt 145 lb 6.4 oz (66 kg)   SpO2 97%   BMI 28.40 kg/m   BP Readings from Last 3 Encounters:  07/04/21 130/80  06/09/21 139/86  06/08/21 (!) 164/97    Wt Readings from Last 3 Encounters:  07/04/21 145 lb 6.4 oz (66 kg)  06/09/21 145 lb (65.8 kg)  06/08/21 146 lb 9.6 oz (66.5 kg)    General appearance: alert, cooperative and appears stated age Ears: normal TM's and external ear canals both ears Throat: lips, mucosa, and tongue normal; teeth and gums normal Neck: no adenopathy, no carotid bruit, supple, symmetrical, trachea midline and thyroid not enlarged, symmetric, no tenderness/mass/nodules Back: symmetric, no curvature. ROM normal. No CVA tenderness. Lungs: clear to auscultation bilaterally Heart: regular rate and rhythm, S1, S2 normal, no  murmur, click, rub or gallop Abdomen: soft, non-tender; bowel sounds normal; no masses,  no organomegaly Pulses: 2+ and symmetric Skin: Skin color, texture, turgor normal. No rashes or lesions Lymph nodes: Cervical, supraclavicular, and axillary nodes normal.  Lab Results  Component Value Date   HGBA1C 6.0 (H) 03/22/2021   HGBA1C 5.9 (H) 03/21/2021   HGBA1C 6.6 (H) 01/06/2021    Lab Results  Component Value Date   CREATININE 0.70 05/30/2021   CREATININE 0.91 05/12/2021   CREATININE 0.79 03/21/2021    Lab Results  Component Value Date   WBC 8.5 03/21/2021   HGB 11.7 (L) 03/21/2021   HCT 38.2 03/21/2021  PLT 372 03/21/2021   GLUCOSE 107 (H) 05/30/2021   CHOL 149 05/12/2021   TRIG 119.0 05/12/2021   HDL 86.20 05/12/2021   LDLDIRECT 112.0 10/13/2016   LDLCALC 39 05/12/2021   ALT 27 06/16/2021   AST 15 06/16/2021   NA 137 05/30/2021   K 3.8 05/30/2021   CL 98 05/30/2021   CREATININE 0.70 05/30/2021   BUN 10 05/30/2021   CO2 28 05/30/2021   TSH 0.93 04/16/2020   INR 1.0 03/21/2021   HGBA1C 6.0 (H) 03/22/2021   MICROALBUR 2.0 04/16/2020    US Abdomen Limited RUQ (LIVER/GB)  Result Date: 06/02/2021 CLINICAL DATA:  Elevated liver enzymes EXAM: ULTRASOUND ABDOMEN LIMITED RIGHT UPPER QUADRANT COMPARISON:  CT abdomen/pelvis 08/19/2018 FINDINGS: Gallbladder: Shadowing gallstones are seen measuring up to 1.3 cm. There is no gallbladder wall thickening or pericholecystic fluid. Sonographic Percell Miller sign was reported negative. Common bile duct: Diameter: 4 mm. Liver: No focal lesion identified. Within normal limits in parenchymal echogenicity. Portal vein is patent on color Doppler imaging with normal direction of blood flow towards the liver. Other: None. IMPRESSION: Cholelithiasis without evidence of acute cholecystitis. Electronically Signed   By: Valetta Mole M.D.   On: 06/02/2021 10:16    Assessment & Plan:   Problem List Items Addressed This Visit     Thoracic aortic  atherosclerosis ([place])    Has suspended atorvastatin due to liver enzyme elevatiion       Polyarthralgia    Screening for inflammation was negative      Hypertension   Relevant Orders   Basic metabolic panel   Urine Microalbumin w/creat. ratio   Hyperlipidemia associated with type 2 diabetes mellitus ([place])    Currently well-controlled on current medications . She has lost 12 lbs since October. . Patient is reminded to schedule an annual eye exam and foot exam is normal today. Patient has no microalbuminuria. Patient is tolerating statin therapy  with atorvastatin 20 mg daily.Marland Kitchen  LDL is at goal and LFTs are normal.  Taking 25 mg losartan for BP.  No changes today continue metformin, ozempicand losartan   Lab Results  Component Value Date   HGBA1C 6.0 (H) 03/22/2021   Lab Results  Component Value Date   LABMICR See below: 03/17/2019   LABMICR See below: 02/03/2019   MICROALBUR 2.0 04/16/2020   MICROALBUR 1.9 08/15/2019    Lab Results  Component Value Date   CHOL 149 05/12/2021   HDL 86.20 05/12/2021   LDLCALC 39 05/12/2021   LDLDIRECT 112.0 10/13/2016   TRIG 119.0 05/12/2021   CHOLHDL 2 05/12/2021  . Lab Results  Component Value Date   ALT 27 06/16/2021   AST 15 06/16/2021   ALKPHOS 339 (H) 06/16/2021   BILITOT 0.3 06/16/2021         Relevant Orders   Hemoglobin A1c   Elevated liver enzymes - Primary    Transient elevation, noted on September  All are either mormalized or near normal with cessation of statin,  celebrex and tylenol.  Rechecking today as she has resumed celebrex and tylenol 2 weeks ago        Relevant Orders   Hepatic function panel    I spent 30 minutes dedicated to the care of this patient on the date of this encounter to include pre-visit review of patient's medical history,  most recent  office visits with me and patient's specialists,  most recent ER visit/hospitalization, EKG, imaging studies, Face-to-face time with the patient , and post  visit  ordering of testing and therapeutics.    Follow-up: No follow-ups on file.   Crecencio Mc, MD

## 2021-07-04 NOTE — Assessment & Plan Note (Signed)
Has suspended atorvastatin due to liver enzyme elevatiion  ?

## 2021-07-04 NOTE — Patient Instructions (Addendum)
We are beginning the temazepam wean: ?. ?Alternate daily between temazepam and 5 mg ambien for the next month  ? ?On the nights you take ambien 5 mg. You may add either tylenol PM  if in pain. or hydroxyzine if needed and not in pain  ? ? ?If you are not able to sleep on the nights you skip the temazepam,  we will need to do the wean a different way:  with 15 mg daily for one month,, then 7.5 mg daily for one month,  etc ? ? ? ?Repeat liver enzymes today since you have resumed tylenol and celebrex  for the past 2 weeks  ? ? ?The last test for inflammatory arthritis was negative    ?

## 2021-07-04 NOTE — Assessment & Plan Note (Signed)
Currently well-controlled on current medications . She has lost 12 lbs since October. . Patient is reminded to schedule an annual eye exam and foot exam is normal today. Patient has no microalbuminuria. Patient is tolerating statin therapy  with atorvastatin 20 mg daily.Marland Kitchen  LDL is at goal and LFTs are normal.  Taking 25 mg losartan for BP.  No changes today continue metformin, ozempicand losartan  ? ?Lab Results  ?Component Value Date  ? HGBA1C 6.0 (H) 03/22/2021  ? ?Lab Results  ?Component Value Date  ? LABMICR See below: 03/17/2019  ? LABMICR See below: 02/03/2019  ? MICROALBUR 2.0 04/16/2020  ? MICROALBUR 1.9 08/15/2019  ? ? ?Lab Results  ?Component Value Date  ? CHOL 149 05/12/2021  ? HDL 86.20 05/12/2021  ? LDLCALC 39 05/12/2021  ? LDLDIRECT 112.0 10/13/2016  ? TRIG 119.0 05/12/2021  ? CHOLHDL 2 05/12/2021  ?Marland Kitchen ?Lab Results  ?Component Value Date  ? ALT 27 06/16/2021  ? AST 15 06/16/2021  ? ALKPHOS 339 (H) 06/16/2021  ? BILITOT 0.3 06/16/2021  ? ? ?

## 2021-07-04 NOTE — Assessment & Plan Note (Signed)
Transient elevation, noted on September  All are either mormalized or near normal with cessation of statin,  celebrex and tylenol.  Rechecking today as she has resumed celebrex and tylenol 2 weeks ago  ? ?

## 2021-07-04 NOTE — Assessment & Plan Note (Signed)
Screening for inflammation was negative ?

## 2021-07-05 DIAGNOSIS — M79672 Pain in left foot: Secondary | ICD-10-CM | POA: Diagnosis not present

## 2021-07-05 DIAGNOSIS — M19072 Primary osteoarthritis, left ankle and foot: Secondary | ICD-10-CM | POA: Diagnosis not present

## 2021-07-05 NOTE — Progress Notes (Signed)
Outreach to inform pt of [facility] transition and offer appt with RN CM. ? ?Left detailed message. ? ?Noreene Larsson, RMA ?Care Guide, Embedded Care Coordination ?[facility]  ?[place], [place] [address] ?Direct Dial: [phone] ?Museum/gallery [email or web address] ?Website: [email or web address]  ? ?

## 2021-07-06 ENCOUNTER — Telehealth: Payer: Self-pay | Admitting: Internal Medicine

## 2021-07-06 DIAGNOSIS — F5105 Insomnia due to other mental disorder: Secondary | ICD-10-CM

## 2021-07-06 DIAGNOSIS — F418 Other specified anxiety disorders: Secondary | ICD-10-CM

## 2021-07-06 MED ORDER — ZOLPIDEM TARTRATE 5 MG PO TABS: 5.0000 mg | ORAL_TABLET | Freq: Every evening | ORAL | 1 refills | Status: DC | PRN

## 2021-07-06 NOTE — Addendum Note (Signed)
Addended by: Crecencio Mc on: 07/06/2021 06:38 PM ? ? Modules accepted: Orders ? ?

## 2021-07-06 NOTE — Assessment & Plan Note (Signed)
Plans to wean temazepam with alternating use of ambien 5 mg daily  ?

## 2021-07-06 NOTE — Telephone Encounter (Signed)
Patient saw Dr Derrel Nip on Monday and she was suppose to call in [place] for her. She said Dr Derrel Nip wanted to wean patient off of temazepam (RESTORIL) 22.5 MG capsule. ?Please send to [facility] in [place]. ?

## 2021-07-07 NOTE — Telephone Encounter (Signed)
Left message letting pt know that medication has been sent in.  ?

## 2021-07-11 ENCOUNTER — Ambulatory Visit: Payer: Medicare HMO

## 2021-07-18 ENCOUNTER — Telehealth: Payer: Self-pay | Admitting: Internal Medicine

## 2021-07-18 NOTE — Telephone Encounter (Signed)
The patient is continuing to have flu like symptoms. Sick last two weekends. She is requesting a referral to rheumatology. ?

## 2021-07-19 NOTE — Telephone Encounter (Signed)
Pt called back and has been scheduled for this week with Dr. Derrel Nip. Pt is aware of appt date and time.  ?

## 2021-07-19 NOTE — Telephone Encounter (Signed)
LMTCB

## 2021-07-19 NOTE — Telephone Encounter (Signed)
Pt called in stating that Dr. Derrel Nip referred her to rheumatology... Pt called in to get an update on referral... Per Janett Billow to advise pt of below note... Pt is requesting to see Dr. Derrel Nip in office... Dr. Derrel Nip doesn't have any avail appt until 08/02/2021... Pt stated that she is still not feeling well... Pt stated that she is experiencing aching, fever, and chills... Pt is frustrated... Pt requesting callback...  ?

## 2021-07-20 DIAGNOSIS — M542 Cervicalgia: Secondary | ICD-10-CM | POA: Diagnosis not present

## 2021-07-20 DIAGNOSIS — E119 Type 2 diabetes mellitus without complications: Secondary | ICD-10-CM | POA: Diagnosis not present

## 2021-07-20 DIAGNOSIS — M25511 Pain in right shoulder: Secondary | ICD-10-CM | POA: Diagnosis not present

## 2021-07-21 ENCOUNTER — Ambulatory Visit (INDEPENDENT_AMBULATORY_CARE_PROVIDER_SITE_OTHER): Payer: Medicare HMO | Admitting: Internal Medicine

## 2021-07-21 ENCOUNTER — Encounter: Payer: Self-pay | Admitting: Internal Medicine

## 2021-07-21 VITALS — BP 142/74 | HR 97 | Temp 98.7°F | Ht 60.0 in | Wt 144.0 lb

## 2021-07-21 DIAGNOSIS — F01A4 Vascular dementia, mild, with anxiety: Secondary | ICD-10-CM

## 2021-07-21 DIAGNOSIS — M79672 Pain in left foot: Secondary | ICD-10-CM

## 2021-07-21 DIAGNOSIS — I6981 Attention and concentration deficit following other cerebrovascular disease: Secondary | ICD-10-CM

## 2021-07-21 DIAGNOSIS — R69 Illness, unspecified: Secondary | ICD-10-CM | POA: Diagnosis not present

## 2021-07-21 DIAGNOSIS — R748 Abnormal levels of other serum enzymes: Secondary | ICD-10-CM | POA: Diagnosis not present

## 2021-07-21 DIAGNOSIS — M797 Fibromyalgia: Secondary | ICD-10-CM

## 2021-07-21 DIAGNOSIS — M2550 Pain in unspecified joint: Secondary | ICD-10-CM

## 2021-07-21 DIAGNOSIS — F5105 Insomnia due to other mental disorder: Secondary | ICD-10-CM

## 2021-07-21 DIAGNOSIS — F418 Other specified anxiety disorders: Secondary | ICD-10-CM

## 2021-07-21 LAB — COMPREHENSIVE METABOLIC PANEL
ALT: 13 U/L (ref 0–35)
AST: 11 U/L (ref 0–37)
Albumin: 4.5 g/dL (ref 3.5–5.2)
Alkaline Phosphatase: 97 U/L (ref 39–117)
BUN: 15 mg/dL (ref 6–23)
CO2: 28 mEq/L (ref 19–32)
Calcium: 10.3 mg/dL (ref 8.4–10.5)
Chloride: 96 mEq/L (ref 96–112)
Creatinine, Ser: 0.77 mg/dL (ref 0.40–1.20)
GFR: 78.7 mL/min (ref >60.00)
Glucose, Bld: 125 mg/dL — ABNORMAL HIGH (ref 70–99)
Potassium: 4.6 mEq/L (ref 3.5–5.1)
Sodium: 133 mEq/L — ABNORMAL LOW (ref 135–145)
Total Bilirubin: 0.4 mg/dL (ref 0.2–1.2)
Total Protein: 7.2 g/dL (ref 6.0–8.3)

## 2021-07-21 MED ORDER — ZOLPIDEM TARTRATE ER 6.25 MG PO TBCR: 6.2500 mg | EXTENDED_RELEASE_TABLET | Freq: Every evening | ORAL | 0 refills | Status: DC | PRN

## 2021-07-21 NOTE — Patient Instructions (Signed)
Please stop the Restoril ? ?You can take 10 mg of ambien if the ambien Cr can't be filled yet  ? ?Referral to Dr Posey Pronto in progress  ? ? ?

## 2021-07-21 NOTE — Progress Notes (Signed)
Subjective:  Patient ID: TASNEEM GRIEF, female    DOB: [birth year]  Age: 71 y.o. MRN: [id number]  CC: The primary encounter diagnosis was Elevated liver enzymes. Diagnoses of Polyarthralgia, Pain of left midfoot, Attention and concentration deficit following other cerebrovascular disease, Fibromyalgia, Vascular dementia, mild, with anxiety ([place]), and Insomnia secondary to depression with anxiety were also pertinent to this visit.  Joint pain    This visit occurred during the SARS-CoV-2 public health emergency.  Safety protocols were in place, including screening questions prior to the visit, additional usage of staff PPE, and extensive cleaning of exam room while observing appropriate contact time as indicated for disinfecting solutions.    HPI LEANNIE CLEVINGER presents for request to see rheumatology Chief Complaint  Patient presents with   Follow-up    Pt would like to discuss the need for a referral to rheumatology   Alissa is a 71 yr old female with chronic pain secondary to degenerative joint disease involving both shoulders , knees and feet as well as lumbar spine , presents for follow up on recurrent episodes of flu like illness accompanied by joint and muscle pain and request to see a rheumatologist for further evaluation   Since her last visit:  1) Right shoulder injected under flouro yesterday by [facility] Dr. Gwyneth Sprout ; previously done by [facility] with limited success.   PT ordered .    2) Dr Harlow Mares injected  left medial knee 1 week ago with steroids.   3) She had her left forefoot  injected with steroids by Vickki Muff 2 weeks ago   2) cognitive issues worsening despite taking aricept and treating bipolar.  Not sleeping despite retoril and ambien .  Waking up early due to pain   Outpatient Medications Prior to Visit  Medication Sig Dispense Refill   acetaminophen (TYLENOL) 325 MG tablet Take 650 mg by mouth every 6 (six) hours as needed.     aspirin EC 81 MG tablet Take 81 mg  by mouth daily. Swallow whole.     Biotin 5 MG CAPS Take 2 capsules by mouth 2 (two) times daily.      blood glucose meter kit and supplies Dispense Contour next bayer meter. E11.9 To check blood glucose  Once a day. 1 each 0   buPROPion (WELLBUTRIN XL) 300 MG 24 hr tablet TAKE ONE TABLET BY MOUTH DAILY 30 tablet 1   celecoxib (CELEBREX) 200 MG capsule Take by mouth 2 (two) times daily.     Cholecalciferol (VITAMIN D3) 1000 units CAPS Take 1,000 Units by mouth daily.      cyanocobalamin (,VITAMIN B-12,) 1000 MCG/ML injection INJECT 1 ML INTO THE MUSCLE EVERY 30 DAYS 3 mL 1   diphenhydramine-acetaminophen (TYLENOL PM) 25-500 MG TABS tablet Take 1 tablet by mouth at bedtime as needed.     donepezil (ARICEPT) 5 MG tablet Take 5 mg by mouth daily.     DULoxetine 40 MG CPEP Take 40 mg by mouth daily. 30 capsule 2   glucose blood (FREESTYLE LITE) test strip Use once daily  as instructed to test blood sugar E 11.9 100 each 3   hydrOXYzine (VISTARIL) 25 MG capsule Take 1 capsule (25 mg total) by mouth 3 (three) times daily. 90 capsule 0   lamoTRIgine (LAMICTAL) 200 MG tablet TAKE ONE TABLET BY MOUTH TWICE A DAY 180 tablet 1   Lancets (FREESTYLE) lancets Use once daily to check blood sugars  E11.9 100 each 3   losartan (COZAAR) 50 MG  tablet Take 1 tablet (50 mg total) by mouth daily. 90 tablet 3   methocarbamol (ROBAXIN) 500 MG tablet methocarbamol 500 mg tablet  1 tab daily with food     nitroGLYCERIN (NITROSTAT) 0.4 MG SL tablet Place 1 tablet (0.4 mg total) under the tongue every 5 (five) minutes as needed for chest pain. 50 tablet 3   omeprazole (PRILOSEC) 40 MG capsule TAKE ONE CAPSULE BY MOUTH DAILY 90 capsule 3   Semaglutide,0.25 or 0.'5MG'$ /DOS, (OZEMPIC, 0.25 OR 0.5 MG/DOSE,) 2 MG/1.5ML SOPN Inject 0.5 mg into the skin once a week. Inject 0.25 mg weekly for 4 weeks then increase to 0.5 mg weekly 1.5 mL 0   Syringe, Disposable, 1 ML MISC For use with B12 injections 25 each 3   temazepam (RESTORIL)  22.5 MG capsule TAKE ONE CAPSULE BY MOUTH EVERY NIGHT AT BEDTIME AS NEEDED FOR SLEEP 30 capsule 3   zolpidem (AMBIEN) 5 MG tablet Take 1 tablet (5 mg total) by mouth at bedtime as needed for sleep. Alternating with temazepam 15 tablet 1   No facility-administered medications prior to visit.    Review of Systems;  Patient denies headache, fevers, malaise, unintentional weight loss, skin rash, eye pain, sinus congestion and sinus pain, sore throat, dysphagia,  hemoptysis , cough, dyspnea, wheezing, chest pain, palpitations, orthopnea, edema, abdominal pain, nausea, melena, diarrhea, constipation, flank pain, dysuria, hematuria, urinary  Frequency, nocturia, numbness, tingling, seizures,  Focal weakness, Loss of consciousness,  Tremor, insomnia, depression, anxiety, and suicidal ideation.      Objective:  BP (!) 142/74 (BP Location: Left Arm, Patient Position: Sitting, Cuff Size: Normal)   Pulse 97   Temp 98.7 F (37.1 C) (Oral)   Ht 5' (1.524 m)   Wt 144 lb (65.3 kg)   SpO2 96%   BMI 28.12 kg/m   BP Readings from Last 3 Encounters:  07/21/21 (!) 142/74  07/04/21 130/80  06/09/21 139/86    Wt Readings from Last 3 Encounters:  07/21/21 144 lb (65.3 kg)  07/04/21 145 lb 6.4 oz (66 kg)  06/09/21 145 lb (65.8 kg)    General appearance: alert, cooperative and appears stated age Ears: normal TM's and external ear canals both ears Throat: lips, mucosa, and tongue normal; teeth and gums normal Neck: no adenopathy, no carotid bruit, supple, symmetrical, trachea midline and thyroid not enlarged, symmetric, no tenderness/mass/nodules Back: symmetric, no curvature. ROM normal. No CVA tenderness. Lungs: clear to auscultation bilaterally Heart: regular rate and rhythm, S1, S2 normal, no murmur, click, rub or gallop Abdomen: soft, non-tender; bowel sounds normal; no masses,  no organomegaly Pulses: 2+ and symmetric Skin: Skin color, texture, turgor normal. No rashes or lesions Lymph nodes:  Cervical, supraclavicular, and axillary nodes normal.  Lab Results  Component Value Date   HGBA1C 6.2 07/04/2021   HGBA1C 6.0 (H) 03/22/2021   HGBA1C 5.9 (H) 03/21/2021    Lab Results  Component Value Date   CREATININE 0.77 07/21/2021   CREATININE 0.73 07/04/2021   CREATININE 0.70 05/30/2021    Lab Results  Component Value Date   WBC 8.5 03/21/2021   HGB 11.7 (L) 03/21/2021   HCT 38.2 03/21/2021   PLT 372 03/21/2021   GLUCOSE 125 (H) 07/21/2021   CHOL 149 05/12/2021   TRIG 119.0 05/12/2021   HDL 86.20 05/12/2021   LDLDIRECT 112.0 10/13/2016   LDLCALC 39 05/12/2021   ALT 13 07/21/2021   AST 11 07/21/2021   NA 133 (L) 07/21/2021   K 4.6 07/21/2021  CL 96 07/21/2021   CREATININE 0.77 07/21/2021   BUN 15 07/21/2021   CO2 28 07/21/2021   TSH 0.93 04/16/2020   INR 1.0 03/21/2021   HGBA1C 6.2 07/04/2021   MICROALBUR 1.0 07/04/2021    US Abdomen Limited RUQ (LIVER/GB)  Result Date: 06/02/2021 CLINICAL DATA:  Elevated liver enzymes EXAM: ULTRASOUND ABDOMEN LIMITED RIGHT UPPER QUADRANT COMPARISON:  CT abdomen/pelvis 08/19/2018 FINDINGS: Gallbladder: Shadowing gallstones are seen measuring up to 1.3 cm. There is no gallbladder wall thickening or pericholecystic fluid. Sonographic Percell Miller sign was reported negative. Common bile duct: Diameter: 4 mm. Liver: No focal lesion identified. Within normal limits in parenchymal echogenicity. Portal vein is patent on color Doppler imaging with normal direction of blood flow towards the liver. Other: None. IMPRESSION: Cholelithiasis without evidence of acute cholecystitis. Electronically Signed   By: Valetta Mole M.D.   On: 06/02/2021 10:16    Assessment & Plan:   Problem List Items Addressed This Visit     Vascular dementia, mild, with anxiety ([place])    Aggravated by poor sleep despite trial of restoril.. she has been advised not to use restoril anymore.         Relevant Medications   zolpidem (AMBIEN CR) 6.25 MG CR tablet    Insomnia secondary to depression with anxiety    Changing ambien to CR   Dc restoril        Attention and concentration deficit following other cerebrovascular disease    She is concerned about progression of her cognitive changes complicated by bipolar disorder .  She is taking aricept  wellbutrin, cymbalta and lamictal.        Polyarthralgia   Relevant Orders   Ambulatory referral to Rheumatology   Pain of left midfoot    secondary to severe hallux valgus deformity with severe subluxation of the first MTPJ and Osteoarthritic changes of the 2nd and 3rd met cuneiform joint.  She has deferred surgical correction and received an I/A steroid inejction on April 4 by Dr Vickki Muff       Fibromyalgia    I suspect that her recurrent  episodes of flu like body aches and subjective fevers are due to FM, given the lack of objective signs of inflammation on exam and with screening serology , but will refer rheumatology for a second opinion       Elevated liver enzymes - Primary   Relevant Orders   Comprehensive metabolic panel (Completed)    Follow-up: Return in about 3 months (around 10/20/2021).   Crecencio Mc, MD

## 2021-07-23 DIAGNOSIS — M79672 Pain in left foot: Secondary | ICD-10-CM | POA: Insufficient documentation

## 2021-07-23 NOTE — Assessment & Plan Note (Signed)
I suspect that her recurrent  episodes of flu like body aches and subjective fevers are due to FM, given the lack of objective signs of inflammation on exam and with screening serology , but will refer rheumatology for a second opinion ?

## 2021-07-23 NOTE — Assessment & Plan Note (Signed)
Aggravated by poor sleep despite trial of restoril.. she has been advised not to use restoril anymore.   ?

## 2021-07-23 NOTE — Assessment & Plan Note (Signed)
Changing ambien to CR   Dc restoril  ?

## 2021-07-23 NOTE — Assessment & Plan Note (Addendum)
She is concerned about progression of her cognitive changes complicated by bipolar disorder .  She is taking aricept  wellbutrin, cymbalta and lamictal.  ?

## 2021-07-23 NOTE — Assessment & Plan Note (Signed)
secondary to severe hallux valgus deformity with severe subluxation of the first MTPJ and Osteoarthritic changes of the 2nd and 3rd met cuneiform joint.  She has deferred surgical correction and received an I/A steroid inejction on April 4 by Dr Vickki Muff ?

## 2021-07-28 ENCOUNTER — Ambulatory Visit: Payer: Medicare HMO | Admitting: Internal Medicine

## 2021-07-28 ENCOUNTER — Other Ambulatory Visit: Payer: Self-pay | Admitting: Internal Medicine

## 2021-07-28 ENCOUNTER — Ambulatory Visit: Payer: Medicare HMO | Admitting: Gastroenterology

## 2021-08-01 ENCOUNTER — Telehealth: Payer: Self-pay | Admitting: Internal Medicine

## 2021-08-01 NOTE — Telephone Encounter (Signed)
Patient has been working with Catie on her Semaglutide,0.25 or 0.'5MG'$ /DOS, (OZEMPIC, 0.25 OR 0.5 MG/DOSE,) 2 MG/1.5ML SOPN. She is needing a refill. Patient was wondering if it will take awhile. She is out of her Ozempic. Please call her. ?

## 2021-08-02 NOTE — Telephone Encounter (Signed)
Spoke with pt to let her know that she will need to reapply for assistance through [employer] for [employer]. I let pt know that I would fax over the referral to [facility] and see if we have any samples to give her until she gets approved again.  ? ?Pt stated that she is currently on the 0.5 mg dose but has been out of it for two weeks. Is it okay to give a sample? If so does she need to start back over with the 0.25 mg dose or continue at the 0.5 mg dose.  ?

## 2021-08-02 NOTE — Telephone Encounter (Signed)
LMTCB

## 2021-08-02 NOTE — Telephone Encounter (Signed)
Pt returning call. Pt requesting callback.  °

## 2021-08-04 DIAGNOSIS — G4700 Insomnia, unspecified: Secondary | ICD-10-CM | POA: Diagnosis not present

## 2021-08-04 DIAGNOSIS — R69 Illness, unspecified: Secondary | ICD-10-CM | POA: Diagnosis not present

## 2021-08-04 DIAGNOSIS — M2550 Pain in unspecified joint: Secondary | ICD-10-CM | POA: Diagnosis not present

## 2021-08-04 DIAGNOSIS — Z8673 Personal history of transient ischemic attack (TIA), and cerebral infarction without residual deficits: Secondary | ICD-10-CM | POA: Diagnosis not present

## 2021-08-04 DIAGNOSIS — R9082 White matter disease, unspecified: Secondary | ICD-10-CM | POA: Diagnosis not present

## 2021-08-04 DIAGNOSIS — G3184 Mild cognitive impairment, so stated: Secondary | ICD-10-CM | POA: Diagnosis not present

## 2021-08-04 NOTE — Telephone Encounter (Signed)
Pt came into office to drop off handicap form for provider to fill out and pt want an update on ozempic samples ?

## 2021-08-06 ENCOUNTER — Emergency Department: Payer: Medicare HMO

## 2021-08-06 ENCOUNTER — Emergency Department
Admission: EM | Admit: 2021-08-06 | Discharge: 2021-08-06 | Disposition: A | Discharge status: Home / Self Care | Payer: Medicare HMO | Attending: Emergency Medicine | Admitting: Emergency Medicine

## 2021-08-06 ENCOUNTER — Other Ambulatory Visit: Payer: Self-pay

## 2021-08-06 DIAGNOSIS — R7989 Other specified abnormal findings of blood chemistry: Secondary | ICD-10-CM | POA: Diagnosis not present

## 2021-08-06 DIAGNOSIS — R000 Tachycardia, unspecified: Secondary | ICD-10-CM | POA: Insufficient documentation

## 2021-08-06 DIAGNOSIS — M79601 Pain in right arm: Secondary | ICD-10-CM | POA: Insufficient documentation

## 2021-08-06 DIAGNOSIS — R079 Chest pain, unspecified: Secondary | ICD-10-CM | POA: Diagnosis not present

## 2021-08-06 DIAGNOSIS — M79605 Pain in left leg: Secondary | ICD-10-CM | POA: Insufficient documentation

## 2021-08-06 DIAGNOSIS — R0981 Nasal congestion: Secondary | ICD-10-CM | POA: Diagnosis not present

## 2021-08-06 DIAGNOSIS — R5383 Other fatigue: Secondary | ICD-10-CM | POA: Diagnosis not present

## 2021-08-06 DIAGNOSIS — M79604 Pain in right leg: Secondary | ICD-10-CM | POA: Diagnosis not present

## 2021-08-06 DIAGNOSIS — R410 Disorientation, unspecified: Secondary | ICD-10-CM | POA: Diagnosis not present

## 2021-08-06 DIAGNOSIS — R0602 Shortness of breath: Secondary | ICD-10-CM | POA: Insufficient documentation

## 2021-08-06 DIAGNOSIS — R112 Nausea with vomiting, unspecified: Secondary | ICD-10-CM | POA: Diagnosis not present

## 2021-08-06 DIAGNOSIS — R4182 Altered mental status, unspecified: Secondary | ICD-10-CM | POA: Diagnosis not present

## 2021-08-06 DIAGNOSIS — Z8616 Personal history of COVID-19: Secondary | ICD-10-CM | POA: Diagnosis not present

## 2021-08-06 DIAGNOSIS — R0789 Other chest pain: Secondary | ICD-10-CM | POA: Diagnosis not present

## 2021-08-06 DIAGNOSIS — D72829 Elevated white blood cell count, unspecified: Secondary | ICD-10-CM | POA: Diagnosis not present

## 2021-08-06 DIAGNOSIS — M79602 Pain in left arm: Secondary | ICD-10-CM | POA: Insufficient documentation

## 2021-08-06 DIAGNOSIS — R519 Headache, unspecified: Secondary | ICD-10-CM | POA: Insufficient documentation

## 2021-08-06 DIAGNOSIS — E1165 Type 2 diabetes mellitus with hyperglycemia: Secondary | ICD-10-CM | POA: Insufficient documentation

## 2021-08-06 DIAGNOSIS — Z20822 Contact with and (suspected) exposure to covid-19: Secondary | ICD-10-CM | POA: Insufficient documentation

## 2021-08-06 DIAGNOSIS — I10 Essential (primary) hypertension: Secondary | ICD-10-CM | POA: Insufficient documentation

## 2021-08-06 DIAGNOSIS — Z85828 Personal history of other malignant neoplasm of skin: Secondary | ICD-10-CM | POA: Diagnosis not present

## 2021-08-06 LAB — URINALYSIS, COMPLETE (UACMP) WITH MICROSCOPIC
Bacteria, UA: NONE SEEN
Bilirubin Urine: NEGATIVE
Glucose, UA: NEGATIVE mg/dL
Hgb urine dipstick: NEGATIVE
Ketones, ur: NEGATIVE mg/dL
Leukocytes,Ua: NEGATIVE
Nitrite: NEGATIVE
Protein, ur: NEGATIVE mg/dL
Specific Gravity, Urine: 1.005 (ref 1.005–1.030)
pH: 8 (ref 5.0–8.0)

## 2021-08-06 LAB — LACTIC ACID, PLASMA
Lactic Acid, Venous: 2.2 mmol/L (ref 0.5–1.9)
Lactic Acid, Venous: 1.9 mmol/L (ref 0.5–1.9)

## 2021-08-06 LAB — CBC WITH DIFFERENTIAL/PLATELET
Abs Immature Granulocytes: 0.04 10*3/uL (ref 0.00–0.07)
Basophils Absolute: 0 10*3/uL (ref 0.0–0.1)
Basophils Relative: 0 %
Eosinophils Absolute: 0 10*3/uL (ref 0.0–0.5)
Eosinophils Relative: 0 %
HCT: 42.1 % (ref 36.0–46.0)
Hemoglobin: 13.5 g/dL (ref 12.0–15.0)
Immature Granulocytes: 0 %
Lymphocytes Relative: 8 %
Lymphs Abs: 0.9 10*3/uL (ref 0.7–4.0)
MCH: 29.7 pg (ref 26.0–34.0)
MCHC: 32.1 g/dL (ref 30.0–36.0)
MCV: 92.5 fL (ref 80.0–100.0)
Monocytes Absolute: 0.9 10*3/uL (ref 0.1–1.0)
Monocytes Relative: 8 %
Neutro Abs: 9.5 10*3/uL — ABNORMAL HIGH (ref 1.7–7.7)
Neutrophils Relative %: 84 %
Platelets: 302 10*3/uL (ref 150–400)
RBC: 4.55 MIL/uL (ref 3.87–5.11)
RDW: 14.4 % (ref 11.5–15.5)
WBC: 11.4 10*3/uL — ABNORMAL HIGH (ref 4.0–10.5)
nRBC: 0 % (ref 0.0–0.2)

## 2021-08-06 LAB — COMPREHENSIVE METABOLIC PANEL
ALT: 19 U/L (ref 0–44)
AST: 19 U/L (ref 15–41)
Albumin: 4 g/dL (ref 3.5–5.0)
Alkaline Phosphatase: 86 U/L (ref 38–126)
Anion gap: 12 (ref 5–15)
BUN: 14 mg/dL (ref 8–23)
CO2: 25 mmol/L (ref 22–32)
Calcium: 9.7 mg/dL (ref 8.9–10.3)
Chloride: 96 mmol/L — ABNORMAL LOW (ref 98–111)
Creatinine, Ser: 0.74 mg/dL (ref 0.44–1.00)
GFR, Estimated: >60 mL/min (ref >60)
Glucose, Bld: 153 mg/dL — ABNORMAL HIGH (ref 70–99)
Potassium: 4.4 mmol/L (ref 3.5–5.1)
Sodium: 133 mmol/L — ABNORMAL LOW (ref 135–145)
Total Bilirubin: 0.7 mg/dL (ref 0.3–1.2)
Total Protein: 7.2 g/dL (ref 6.5–8.1)

## 2021-08-06 LAB — PROCALCITONIN: Procalcitonin: 1.57 ng/mL

## 2021-08-06 LAB — LIPASE, BLOOD: Lipase: 29 U/L (ref 11–51)

## 2021-08-06 LAB — MAGNESIUM: Magnesium: 1.7 mg/dL (ref 1.7–2.4)

## 2021-08-06 LAB — RESP PANEL BY RT-PCR (FLU A&B, COVID) ARPGX2
Influenza A by PCR: NEGATIVE
Influenza B by PCR: NEGATIVE
SARS Coronavirus 2 by RT PCR: NEGATIVE

## 2021-08-06 LAB — TSH: TSH: 0.671 u[IU]/mL (ref 0.350–4.500)

## 2021-08-06 LAB — TROPONIN I (HIGH SENSITIVITY): Troponin I (High Sensitivity): 4 ng/L (ref <18)

## 2021-08-06 LAB — D-DIMER, QUANTITATIVE: D-Dimer, Quant: 0.4 ug/mL-FEU (ref 0.00–0.50)

## 2021-08-06 MED ORDER — ONDANSETRON HCL 4 MG/2ML IJ SOLN
4.0000 mg | Freq: Once | INTRAMUSCULAR | Status: AC
Start: 2021-08-06 — End: 2021-08-06
  Administered 2021-08-06: 4 mg via INTRAVENOUS
  Filled 2021-08-06: qty 2

## 2021-08-06 MED ORDER — ACETAMINOPHEN 500 MG PO TABS
1000.0000 mg | ORAL_TABLET | Freq: Once | ORAL | Status: AC
  Administered 2021-08-06: 1000 mg via ORAL
  Filled 2021-08-06: qty 2

## 2021-08-06 MED ORDER — LACTATED RINGERS IV BOLUS
1000.0000 mL | Freq: Once | INTRAVENOUS | Status: AC
  Administered 2021-08-06: 1000 mL via INTRAVENOUS

## 2021-08-06 NOTE — ED Notes (Signed)
ED Provider at bedside. 

## 2021-08-06 NOTE — ED Provider Notes (Signed)
? ?[facility] ?Provider Note ? ? ? Event Date/Time  ? First MD Initiated Contact with Patient 08/06/21 1214   ?  (approximate) ? ? ?History  ? ?Chest Pain ? ? ?HPI ? ?Katherine Jimenez is a 71 y.o. female with medical history of arthritis, bipolar disorder, depression, DM, HTN as well as a stroke diagnosed in November of last year when patient states she had COVID without any residual deficits who presents for evaluation of multiple complaints including chest pain, headache, nausea and vomiting, aches in her arms and legs, congestion, mild shortness of breath with exertion and feeling extremely fatigued.  Patient states all the symptoms have been ongoing since November she feels a little worse today.  She denies any abdominal pain, diarrhea, constipation, burning with urination, vision changes, vertigo earache or sore throat.  She states that she was seen by her PCP last week who is worried about a possible rheumatological cause and referred her to rheumatology but she has not yet seen a rheumatologist. ? ?  ?Past Medical History:  ?Diagnosis Date  ? Arthritis   ? back, knees, fingers  ? Bipolar disorder ([place])   ? Bunion, left   ? Depression   ? Diabetes mellitus   ? diet controlled.   ? Hypertension   ? Lab test positive for detection of COVID-19 virus 03/02/2021  ? Lower back pain   ? S/P fall  ? Squamous cell skin cancer, face   ? [facility]  ? Treadmill stress test negative for angina pectoris June 2013  ? ? ? ?Physical Exam  ?Triage Vital Signs: ?ED Triage Vitals  ?Enc Vitals Group  ?   BP 08/06/21 1210 134/89  ?   Pulse Rate 08/06/21 1210 (!) 108  ?   Resp 08/06/21 1210 20  ?   Temp 08/06/21 1210 99.1 ?F (37.3 ?C)  ?   Temp Source 08/06/21 1210 Oral  ?   SpO2 08/06/21 1210 96 %  ?   Weight 08/06/21 1207 147 lb (66.7 kg)  ?   Height 08/06/21 1207 5' (1.524 m)  ?   Head Circumference --   ?   Peak Flow --   ?   Pain Score 08/06/21 1207 5  ?   Pain Loc --   ?   Pain Edu? --   ?   Excl. in [place]?  --   ? ? ?Most recent vital signs: ?Vitals:  ? 08/06/21 1400 08/06/21 1405  ?BP: 114/67   ?Pulse: 91   ?Resp:    ?Temp:    ?SpO2:  96%  ? ? ?General: Awake, seems uncomfortable. ?CV:  Good peripheral perfusion.  2+ radial pulse.  Slightly tachycardic. ?Resp:  Normal effort.  Bilaterally. ?Abd:  No distention.  Soft throughout. ?Other:  Cranial nerves II through XII grossly intact.  No pronator drift.  No finger dysmetria.  Symmetric 5/5 strength of all extremities.  Sensation intact to light touch in all extremities.  Unremarkable unassisted gait. ? ? ? ?ED Results / Procedures / Treatments  ?Labs ?(all labs ordered are listed, but only abnormal results are displayed) ?Labs Reviewed  ?CBC WITH DIFFERENTIAL/PLATELET - Abnormal; Notable for the following components:  ?    Result Value  ? WBC 11.4 (*)   ? Neutro Abs 9.5 (*)   ? All other components within normal limits  ?COMPREHENSIVE METABOLIC PANEL - Abnormal; Notable for the following components:  ? Sodium 133 (*)   ? Chloride 96 (*)   ?  Glucose, Bld 153 (*)   ? All other components within normal limits  ?URINALYSIS, COMPLETE (UACMP) WITH MICROSCOPIC - Abnormal; Notable for the following components:  ? Color, Urine STRAW (*)   ? APPearance CLEAR (*)   ? All other components within normal limits  ?LACTIC ACID, PLASMA - Abnormal; Notable for the following components:  ? Lactic Acid, Venous 2.2 (*)   ? All other components within normal limits  ?RESP PANEL BY RT-PCR (FLU A&B, COVID) [id number]  ?CULTURE, BLOOD (ROUTINE X 2)  ?CULTURE, BLOOD (ROUTINE X 2)  ?MAGNESIUM  ?TSH  ?PROCALCITONIN  ?LIPASE, BLOOD  ?D-DIMER, QUANTITATIVE  ?LACTIC ACID, PLASMA  ?LACTIC ACID, PLASMA  ?TROPONIN I (HIGH SENSITIVITY)  ? ? ? ?EKG ? ?ECG is remarkable for sinus tachycardia with a ventricular rate of 108, normal axis, unremarkable intervals without clear evidence of acute ischemia or significant arrhythmia. ? ? ?RADIOLOGY ? ?CT head on my review without evidence of intracranial hemorrhage,  edema, mass effect or any other acute process.  I also reviewed radiologist rotation and agree to findings of stable chronic small vessel disease without other acute process. ? ?Chest reviewed by myself shows no focal consoidation, effusion, edema, pneumothorax or other clear acute thoracic process. I also reviewed radiology interpretation and agree with findings described. ? ? ? ?PROCEDURES: ? ?Critical Care performed: No ? ?.1-3 Lead EKG Interpretation ?Performed by: Lucrezia Starch, MD ?Authorized by: Lucrezia Starch, MD  ? ?  Interpretation: non-specific   ?  ECG rate assessment: normal   ?  Rhythm: sinus tachycardia   ?  Ectopy: none   ?  Conduction: normal   ? ?The patient is on the cardiac monitor to evaluate for evidence of arrhythmia and/or significant heart rate changes. ? ? ?MEDICATIONS ORDERED IN ED: ?Medications  ?lactated ringers bolus 1,000 mL (0 mLs Intravenous Stopped 08/06/21 1446)  ?ondansetron (ZOFRAN) injection 4 mg (4 mg Intravenous Given 08/06/21 1257)  ?acetaminophen (TYLENOL) tablet 1,000 mg (1,000 mg Oral Given 08/06/21 1403)  ? ? ? ?IMPRESSION / MDM / ASSESSMENT AND PLAN / ED COURSE  ?I reviewed the triage vital signs and the nursing notes. ?             ?               ? ?Differential diagnosis includes, but is not limited to ACS, PE. pneumonia, bronchitis, arrhythmia, anemia, metabolic derangements, pancreatitis, cystitis with lower suspicion for cholecystitis or appendicitis given absence of any tenderness on exam. ? ?EKG and nonelevated troponin obtained greater than 3 hours after symptom onset is not suggestive of ACS or myocarditis.  CBC shows WC count of 11.4 without evidence of acute anemia.  CMP shows a glucose of 123 without any other significant lecture light or metabolic derangements.  No evidence of hepatitis or cholestatic process.  Magnesium is within normal limits.  TSH is within normal limits.  Lipase not suggestive of pancreatitis.  COVID influenza PCR is negative.  UA is  negative. ? ?CT head on my review without evidence of intracranial hemorrhage, edema, mass effect or any other acute process.  I also reviewed radiologist rotation and agree to findings of stable chronic small vessel disease without other acute process. ? ?Chest reviewed by myself shows no focal consoidation, effusion, edema, pneumothorax or other clear acute thoracic process. I also reviewed radiology interpretation and agree with findings described. ? ?D-dimer is 0.4 and overall this is not suggestive of a PE.  Lactic acid slightly  elevated 2.2 on arrival.  Suspect patient is slightly dehydrated from nausea and vomiting but overall I do not believe she is septic at this time.  Her procalcitonin is slightly elevated 11 is unclear etiology for this at this time. ? ?With regards to her headache I suspect is related to her nausea and vomiting and some dehydration.  Lower suspicion for other acute intracranial process at this time.  Overall unclear etiology of patient's global symptoms also possible she has an acute infectious stratus.  It is also possible she has undiagnosed autoimmune disorder such as polymyalgia rheumatica or fibromyalgia and I think it is appropriate for her to continue to plan to see rheumatology outpatient for this.  She was hydrated with some IV fluids.  She was also given some Tylenol.  She was subsequently able tolerate p.o. after some Zofran.  She states she has this at home and does not want a prescription for it.  Care patient signed over to some provider approximately 1500 with plan to recheck lactic acid and if downtrending discharged home with outpatient PCP and rheumatology follow-up. ?  ? ? ?FINAL CLINICAL IMPRESSION(S) / ED DIAGNOSES  ? ?Final diagnoses:  ?Chest pain, unspecified type  ?Nausea and vomiting, unspecified vomiting type  ?Nonintractable headache, unspecified chronicity pattern, unspecified headache type  ? ? ? ?Rx / DC Orders  ? ?ED Discharge Orders   ? ? None  ? ?   ? ? ? ?Note:  This document was prepared using Dragon voice recognition software and may include unintentional dictation errors. ?  ?Lucrezia Starch, MD ?08/06/21 1527 ? ?

## 2021-08-06 NOTE — ED Triage Notes (Signed)
First Nurse Note:  ?Pt via POV from home. Pt c/o "feeling sick" since November. Pt was followed her by EMS. Pt was requesting to go to [facility]. Per EMS, pt just "feeling sick" they did not elaborate more. Pt is A&Ox4 and NAD. Ambulatory.  ? ?173/96 ?110 HR ?97% on RA ? ?

## 2021-08-06 NOTE — ED Provider Notes (Signed)
Patient----------------------------------------- ?3:23 PM on 08/06/2021 ?----------------------------------------- ? ?Blood pressure 114/67, pulse 91, temperature 99.1 ?F (37.3 ?C), temperature source Oral, resp. rate 20, height 5' (1.524 m), weight 66.7 kg, SpO2 96 %. ? ?Assuming care from Dr. Tamala Julian.  In short, Katherine Jimenez is a 71 y.o. female with a chief complaint of Chest Pain ?Marland Kitchen  Refer to the original H&P for additional details. ? ?The current plan of care is to follow-up repeat lactate, if improving patient appropriate for discharge home given reassuring workup. ? ?----------------------------------------- ?6:00 PM on 08/06/2021 ?----------------------------------------- ?Repeat lactate is now within normal limits and is well-appearing on reassessment without any ongoing symptoms beyond joint pains.  She is appropriate for discharge home with outpatient PCP and rheumatology follow-up as previously scheduled.  She was counseled to return to the ED for new or worsening symptoms, patient and son agree with plan. ? ?  ?Blake Divine, MD ?08/06/21 1801 ? ?

## 2021-08-06 NOTE — ED Triage Notes (Signed)
Pt states she is having CP and SHOB since November- pt states it started with covid in November and that covid caused her to have a stroke- pt states she has been confused and vomiting- pt has been seen many times for this at multiple facilities- pt states she did take a nitro about 2 hours ago ?

## 2021-08-08 NOTE — Telephone Encounter (Signed)
Placed in quick sign folder.  

## 2021-08-09 ENCOUNTER — Telehealth: Payer: Self-pay

## 2021-08-09 NOTE — Telephone Encounter (Signed)
Pt returning call.... Advised pt of below note... Pt understood...  ?

## 2021-08-09 NOTE — Telephone Encounter (Signed)
noted 

## 2021-08-09 NOTE — Telephone Encounter (Signed)
LMTCB. Need to let pt know that the handicap placard form is complete and ready for pick up.  ? ?Form has been placed up front in folder.  ?

## 2021-08-10 NOTE — Telephone Encounter (Signed)
Medication Samples have been provided to the patient. ? ?Drug name: Ozempic       Strength: 2 mg/3 mL        Qty: 1 box  LOT: [id number]  Exp.Date: 12/02/2022 ? ?Dosing instructions: Inject 0.5 mg into skin once weekly.  ? ?The patient has been instructed regarding the correct time, dose, and frequency of taking this medication, including desired effects and most common side effects.  ? ?Katherine Jimenez ?4:23 PM ?08/10/2021  ?

## 2021-08-10 NOTE — Telephone Encounter (Signed)
Spoke with pt to let her know that I was putting a sample aside for her to come by and pick up. Pt gave a verbal understanding.  ?

## 2021-08-11 ENCOUNTER — Telehealth: Payer: Self-pay | Admitting: Internal Medicine

## 2021-08-11 LAB — CULTURE, BLOOD (ROUTINE X 2)
Culture: NO GROWTH
Culture: NO GROWTH
Special Requests: ADEQUATE

## 2021-08-11 NOTE — Telephone Encounter (Signed)
Pt is not able to see rheumatology until 09/12/2021.  ?

## 2021-08-11 NOTE — Telephone Encounter (Signed)
Patient was suppose to have an appointment with Rheumatology, doctor had to cancel because of being sick. Patient was very disappointed, she was hoping to get relief. She was hoping Dr Derrel Nip could help her get some relief from the pain and medication so she can sleep at night. The medication that was given to her is not working.  ?

## 2021-08-12 ENCOUNTER — Other Ambulatory Visit: Payer: Self-pay | Admitting: Internal Medicine

## 2021-08-12 MED ORDER — TRAMADOL HCL 50 MG PO TABS: 50.0000 mg | ORAL_TABLET | Freq: Four times a day (QID) | ORAL | 0 refills | Status: AC | PRN

## 2021-08-12 NOTE — Telephone Encounter (Signed)
Pt stated that she would like to try the Tramadol. ?

## 2021-08-12 NOTE — Addendum Note (Signed)
Addended by: Crecencio Mc on: 08/12/2021 04:19 PM ? ? Modules accepted: Orders ? ?

## 2021-08-15 ENCOUNTER — Ambulatory Visit: Payer: Medicare HMO | Admitting: Dermatology

## 2021-08-16 DIAGNOSIS — M79641 Pain in right hand: Secondary | ICD-10-CM | POA: Diagnosis not present

## 2021-08-16 DIAGNOSIS — M19042 Primary osteoarthritis, left hand: Secondary | ICD-10-CM | POA: Diagnosis not present

## 2021-08-16 DIAGNOSIS — M2550 Pain in unspecified joint: Secondary | ICD-10-CM | POA: Diagnosis not present

## 2021-08-16 DIAGNOSIS — M130 Polyarthritis, unspecified: Secondary | ICD-10-CM | POA: Diagnosis not present

## 2021-08-16 DIAGNOSIS — M2559 Pain in other specified joint: Secondary | ICD-10-CM | POA: Diagnosis not present

## 2021-08-22 ENCOUNTER — Telehealth: Payer: Self-pay

## 2021-08-22 DIAGNOSIS — M2550 Pain in unspecified joint: Secondary | ICD-10-CM

## 2021-08-22 NOTE — Telephone Encounter (Signed)
Referral has been pended.

## 2021-08-22 NOTE — Telephone Encounter (Signed)
Patient states she would like to have a new referral for another rheumatologist.  Patient states she prefers one in [place].

## 2021-08-23 NOTE — Telephone Encounter (Signed)
Pt is aware that referral has been placed.  

## 2021-08-29 ENCOUNTER — Other Ambulatory Visit: Payer: Self-pay | Admitting: Internal Medicine

## 2021-08-30 ENCOUNTER — Ambulatory Visit (INDEPENDENT_AMBULATORY_CARE_PROVIDER_SITE_OTHER): Payer: Medicare HMO | Admitting: Internal Medicine

## 2021-08-30 ENCOUNTER — Encounter: Payer: Self-pay | Admitting: Internal Medicine

## 2021-08-30 VITALS — BP 152/88 | HR 93 | Temp 98.2°F | Ht 60.0 in | Wt 148.6 lb

## 2021-08-30 DIAGNOSIS — F418 Other specified anxiety disorders: Secondary | ICD-10-CM

## 2021-08-30 DIAGNOSIS — M2550 Pain in unspecified joint: Secondary | ICD-10-CM

## 2021-08-30 DIAGNOSIS — G8929 Other chronic pain: Secondary | ICD-10-CM

## 2021-08-30 DIAGNOSIS — F5105 Insomnia due to other mental disorder: Secondary | ICD-10-CM

## 2021-08-30 DIAGNOSIS — M791 Myalgia, unspecified site: Secondary | ICD-10-CM | POA: Diagnosis not present

## 2021-08-30 DIAGNOSIS — G9332 Myalgic encephalomyelitis/chronic fatigue syndrome: Secondary | ICD-10-CM | POA: Diagnosis not present

## 2021-08-30 DIAGNOSIS — R69 Illness, unspecified: Secondary | ICD-10-CM | POA: Diagnosis not present

## 2021-08-30 DIAGNOSIS — U099 Post covid-19 condition, unspecified: Secondary | ICD-10-CM | POA: Diagnosis not present

## 2021-08-30 MED ORDER — CYCLOBENZAPRINE HCL 5 MG PO TABS
5.0000 mg | ORAL_TABLET | Freq: Three times a day (TID) | ORAL | 0 refills | Status: DC | PRN
Start: 2021-08-30 — End: 2021-09-12

## 2021-08-30 MED ORDER — ZOLPIDEM TARTRATE 10 MG PO TABS: 10.0000 mg | ORAL_TABLET | Freq: Every evening | ORAL | 5 refills | Status: DC | PRN

## 2021-08-30 NOTE — Assessment & Plan Note (Signed)
Now aggravated by chronic pain.  7.5 mg Lorrin Mais has worked recently.  Refilling ambien  . Given her age she will be unable to receive the quantity of 30  10 mg doses.  Adding flexeril 5 mg

## 2021-08-30 NOTE — Progress Notes (Signed)
 Subjective:  Patient ID: Katherine Jimenez, female    DOB: 01/02/1951  Age: 71 y.o. MRN: [id number]  CC: The primary encounter diagnosis was Myalgia. Diagnoses of COVID-19 long hauler manifesting chronic fatigue, COVID-19 long hauler manifesting chronic joint pain, and Insomnia secondary to depression with anxiety were also pertinent to this visit.   HPI Brandan P Klauer presents for  Chief Complaint  Patient presents with   Pain    Pain all over   Follow up on complaints of diffuse pain/polyarthralgias  of uncertain etiology  which have become constant since her COVID infection last year,  previously intermittent,  but now occurring  so often that she is missing church and birthday parties.   Seen by [facility] rheumatology May 16.  Placquenil started ,  plain films of hands,  and additional labs drawn.  Since then she has requested a second opinion and is awaiting an appt with a [place] .  Does not want to continue placquenil   She had temporary relief with a steroid taper that was prescribed by Dr . Patel at [facility] started on May 18 and finished yesterday. Willing to try Savella and willing to try PT   Insomnia :  ambien not helping   Outpatient Medications Prior to Visit  Medication Sig Dispense Refill   acetaminophen (TYLENOL) 325 MG tablet Take 650 mg by mouth every 6 (six) hours as needed.     aspirin EC 81 MG tablet Take 81 mg by mouth daily. Swallow whole.     Biotin 5 MG CAPS Take 2 capsules by mouth 2 (two) times daily.      blood glucose meter kit and supplies Dispense Contour next bayer meter. E11.9 To check blood glucose  Once a day. 1 each 0   buPROPion (WELLBUTRIN XL) 300 MG 24 hr tablet TAKE ONE TABLET BY MOUTH DAILY 30 tablet 1   Cholecalciferol (VITAMIN D3) 1000 units CAPS Take 1,000 Units by mouth daily.      cyanocobalamin (,VITAMIN B-12,) 1000 MCG/ML injection INJECT 1 ML INTO THE MUSCLE EVERY 30 DAYS 3 mL 1   diphenhydramine-acetaminophen (TYLENOL PM) 25-500  MG TABS tablet Take 1 tablet by mouth at bedtime as needed.     donepezil (ARICEPT) 5 MG tablet Take 5 mg by mouth daily.     DULoxetine HCl 40 MG CPEP TAKE ONE CAPSULE BY MOUTH DAILY 30 capsule 2   glucose blood (FREESTYLE LITE) test strip Use once daily  as instructed to test blood sugar E 11.9 100 each 3   hydrOXYzine (VISTARIL) 25 MG capsule Take 1 capsule (25 mg total) by mouth 3 (three) times daily. 90 capsule 0   lamoTRIgine (LAMICTAL) 200 MG tablet TAKE ONE TABLET BY MOUTH TWICE A DAY 180 tablet 1   Lancets (FREESTYLE) lancets Use once daily to check blood sugars  E11.9 100 each 3   losartan (COZAAR) 50 MG tablet Take 1 tablet (50 mg total) by mouth daily. 90 tablet 3   methocarbamol (ROBAXIN) 500 MG tablet methocarbamol 500 mg tablet  1 tab daily with food     nitroGLYCERIN (NITROSTAT) 0.4 MG SL tablet Place 1 tablet (0.4 mg total) under the tongue every 5 (five) minutes as needed for chest pain. 50 tablet 3   omeprazole (PRILOSEC) 40 MG capsule TAKE ONE CAPSULE BY MOUTH DAILY 90 capsule 3   Semaglutide,0.25 or 0.5MG/DOS, (OZEMPIC, 0.25 OR 0.5 MG/DOSE,) 2 MG/1.5ML SOPN Inject 0.5 mg into the skin once a week. Inject 0.25   mg weekly for 4 weeks then increase to 0.5 mg weekly 1.5 mL 0   Syringe, Disposable, 1 ML MISC For use with B12 injections 25 each 3   celecoxib (CELEBREX) 200 MG capsule TAKE ONE CAPSULE BY MOUTH TWICE A DAY 60 capsule 0   zolpidem (AMBIEN CR) 6.25 MG CR tablet Take 1 tablet (6.25 mg total) by mouth at bedtime as needed for sleep. 30 tablet 0   No facility-administered medications prior to visit.    Review of Systems;  Patient denies headache, fevers, malaise, unintentional weight loss, skin rash, eye pain, sinus congestion and sinus pain, sore throat, dysphagia,  hemoptysis , cough, dyspnea, wheezing, chest pain, palpitations, orthopnea, edema, abdominal pain, nausea, melena, diarrhea, constipation, flank pain, dysuria, hematuria, urinary  Frequency, nocturia,  numbness, tingling, seizures,  Focal weakness, Loss of consciousness,  Tremor, insomnia, depression, anxiety, and suicidal ideation.      Objective:  BP (!) 152/88 (BP Location: Left Arm, Patient Position: Sitting, Cuff Size: Normal)   Pulse 93   Temp 98.2 F (36.8 C) (Oral)   Ht 5' (1.524 m)   Wt 148 lb 9.6 oz (67.4 kg)   SpO2 95%   BMI 29.02 kg/m   BP Readings from Last 3 Encounters:  08/30/21 (!) 152/88  08/06/21 117/65  07/21/21 (!) 142/74    Wt Readings from Last 3 Encounters:  08/30/21 148 lb 9.6 oz (67.4 kg)  08/06/21 147 lb (66.7 kg)  07/21/21 144 lb (65.3 kg)    General appearance: alert, cooperative and appears stated age Ears: normal TM's and external ear canals both ears Throat: lips, mucosa, and tongue normal; teeth and gums normal Neck: no adenopathy, no carotid bruit, supple, symmetrical, trachea midline and thyroid not enlarged, symmetric, no tenderness/mass/nodules Back: symmetric, no curvature. ROM normal. No CVA tenderness. Lungs: clear to auscultation bilaterally Heart: regular rate and rhythm, S1, S2 normal, no murmur, click, rub or gallop Abdomen: soft, non-tender; bowel sounds normal; no masses,  no organomegaly Pulses: 2+ and symmetric Skin: Skin color, texture, turgor normal. No rashes or lesions Lymph nodes: Cervical, supraclavicular, and axillary nodes normal.  Lab Results  Component Value Date   HGBA1C 6.2 07/04/2021   HGBA1C 6.0 (H) 03/22/2021   HGBA1C 5.9 (H) 03/21/2021    Lab Results  Component Value Date   CREATININE 0.74 08/06/2021   CREATININE 0.77 07/21/2021   CREATININE 0.73 07/04/2021    Lab Results  Component Value Date   WBC 11.4 (H) 08/06/2021   HGB 13.5 08/06/2021   HCT 42.1 08/06/2021   PLT 302 08/06/2021   GLUCOSE 153 (H) 08/06/2021   CHOL 149 05/12/2021   TRIG 119.0 05/12/2021   HDL 86.20 05/12/2021   LDLDIRECT 112.0 10/13/2016   LDLCALC 39 05/12/2021   ALT 19 08/06/2021   AST 19 08/06/2021   NA 133 (L)  08/06/2021   K 4.4 08/06/2021   CL 96 (L) 08/06/2021   CREATININE 0.74 08/06/2021   BUN 14 08/06/2021   CO2 25 08/06/2021   TSH 0.671 08/06/2021   INR 1.0 03/21/2021   HGBA1C 6.2 07/04/2021   MICROALBUR 1.0 07/04/2021    DG Chest 2 View  Result Date: 08/06/2021 CLINICAL DATA:  Chest pain and shortness of breath several months. EXAM: CHEST - 2 VIEW COMPARISON:  03/18/2021 FINDINGS: Lungs are adequately inflated with no focal airspace consolidation or effusion. Mild vascular crowding over the right infrahilar region. Cardiomediastinal silhouette and remainder of the exam is unchanged. IMPRESSION: No acute cardiopulmonary disease. Electronically  Signed   By: Marin Olp M.D.   On: 08/06/2021 12:43   CT HEAD WO CONTRAST (5MM)  Result Date: 08/06/2021 CLINICAL DATA:  Altered mental status.  Confusion. EXAM: CT HEAD WITHOUT CONTRAST TECHNIQUE: Contiguous axial images were obtained from the base of the skull through the vertex without intravenous contrast. RADIATION DOSE REDUCTION: This exam was performed according to the departmental dose-optimization program which includes automated exposure control, adjustment of the mA and/or kV according to patient size and/or use of iterative reconstruction technique. COMPARISON:  03/21/2021 FINDINGS: Brain: No evidence of intracranial hemorrhage, acute infarction, hydrocephalus, extra-axial collection, or mass lesion/mass effect. Mild-to-moderate chronic small vessel disease is stable in appearance. Vascular:  No hyperdense vessel or other acute findings. Skull: No evidence of fracture or other significant bone abnormality. Sinuses/Orbits:  No acute findings. Other: None. IMPRESSION: No acute intracranial abnormality. Stable chronic small vessel disease. Electronically Signed   By: Marlaine Hind M.D.   On: 08/06/2021 13:19    Assessment & Plan:   Problem List Items Addressed This Visit     COVID-19 long hauler manifesting chronic joint pain    Recent and  current workup by me and rheumatology has not found a source for her chronic pain other than the condition of OA and DJD affecting multiple joints.  Her symptoms have become more frequent and occur as episodes of low grade fevers and extreme fatigue.  Recommending trial of acupuncture through Dr Francisca December,  And referring to [facility].  Dr Posey Pronto has ordered PT , which I agree would be integral to her health        Relevant Medications   cyclobenzaprine (FLEXERIL) 5 MG tablet   Insomnia secondary to depression with anxiety    Now aggravated by chronic pain.  7.5 mg Lorrin Mais has worked recently.  Refilling ambien  . Given her age she will be unable to receive the quantity of 30  10 mg doses.  Adding flexeril 5 mg        Other Visit Diagnoses     Myalgia    -  Primary   Relevant Orders   Sedimentation rate   C-reactive protein   CK (Creatine Kinase)   Ambulatory referral to Physical Therapy   COVID-19 long hauler manifesting chronic fatigue       Relevant Orders   Ambulatory referral to Physical Therapy       I spent a total of    38  minutes with this patient in a face to face visit on the date of this encounter reviewing the last office visit with me ,  her recent Rheumatology visit on May 16,  the past labs done in April, her most recent imaging studies,   and post visit ordering of testing and therapeutics.    Follow-up: No follow-ups on file.   Crecencio Mc, MD

## 2021-08-30 NOTE — Patient Instructions (Signed)
Ambien and flexeril refilled  Consider acupuncture by Dr Francisca December chiropractor   Consider Dr Waylan Boga seminar  (see flyer)  Referral to [facility] post [place] [facility] in progress

## 2021-08-30 NOTE — Assessment & Plan Note (Signed)
Recent and current workup by me and rheumatology has not found a source for her chronic pain other than the condition of OA and DJD affecting multiple joints.  Her symptoms have become more frequent and occur as episodes of low grade fevers and extreme fatigue.  Recommending trial of acupuncture through Dr Francisca December,  And referring to [facility].  Dr Posey Pronto has ordered PT , which I agree would be integral to her health

## 2021-08-31 ENCOUNTER — Encounter: Payer: Self-pay | Admitting: Internal Medicine

## 2021-09-05 ENCOUNTER — Other Ambulatory Visit (INDEPENDENT_AMBULATORY_CARE_PROVIDER_SITE_OTHER): Payer: Medicare HMO

## 2021-09-05 ENCOUNTER — Telehealth: Payer: Self-pay

## 2021-09-05 DIAGNOSIS — M791 Myalgia, unspecified site: Secondary | ICD-10-CM

## 2021-09-05 LAB — SEDIMENTATION RATE: Sed Rate: 5 mm/hr (ref 0–30)

## 2021-09-05 LAB — CK: Total CK: 45 U/L (ref 7–177)

## 2021-09-05 NOTE — Telephone Encounter (Signed)
Pt came in this morning for blood work and she stated that she is still having a lot of pain in her back. Pt is wanting to know if something stronger can be called in to help with the pain because the Tramadol is not working. Pt also stated that the Rheumatology referral to [facility] was denied because she had already seen Dr. Posey Pronto and they agreed with him. Pt would like to see if we could send a referral to either [facility] or [facility].

## 2021-09-05 NOTE — Telephone Encounter (Signed)
Patient called to state she is following up with [place] regarding her medication.  Please call.

## 2021-09-06 ENCOUNTER — Other Ambulatory Visit: Payer: Self-pay | Admitting: Internal Medicine

## 2021-09-06 DIAGNOSIS — Z8673 Personal history of transient ischemic attack (TIA), and cerebral infarction without residual deficits: Secondary | ICD-10-CM | POA: Diagnosis not present

## 2021-09-06 DIAGNOSIS — Z7189 Other specified counseling: Secondary | ICD-10-CM | POA: Diagnosis not present

## 2021-09-06 DIAGNOSIS — M545 Low back pain, unspecified: Secondary | ICD-10-CM | POA: Diagnosis not present

## 2021-09-06 DIAGNOSIS — R251 Tremor, unspecified: Secondary | ICD-10-CM | POA: Diagnosis not present

## 2021-09-06 DIAGNOSIS — R69 Illness, unspecified: Secondary | ICD-10-CM | POA: Diagnosis not present

## 2021-09-06 DIAGNOSIS — G8929 Other chronic pain: Secondary | ICD-10-CM | POA: Diagnosis not present

## 2021-09-06 DIAGNOSIS — M2550 Pain in unspecified joint: Secondary | ICD-10-CM | POA: Diagnosis not present

## 2021-09-06 DIAGNOSIS — R0683 Snoring: Secondary | ICD-10-CM | POA: Diagnosis not present

## 2021-09-06 DIAGNOSIS — G3184 Mild cognitive impairment, so stated: Secondary | ICD-10-CM | POA: Diagnosis not present

## 2021-09-06 DIAGNOSIS — R079 Chest pain, unspecified: Secondary | ICD-10-CM | POA: Diagnosis not present

## 2021-09-06 DIAGNOSIS — G4700 Insomnia, unspecified: Secondary | ICD-10-CM | POA: Diagnosis not present

## 2021-09-06 LAB — C-REACTIVE PROTEIN: CRP: <1 mg/dL (ref 0.5–20.0)

## 2021-09-06 NOTE — Telephone Encounter (Signed)
FYI

## 2021-09-06 NOTE — Telephone Encounter (Signed)
Patient called office back and cancel appointment for 09/06/2021. Patient states she is going to try acupuncture.

## 2021-09-06 NOTE — Telephone Encounter (Signed)
Patient called in and Dr. Lupita Dawn note was read to patient.  Patient requested an appointment with Dr. Derrel Nip to discuss, appointment was made for 08/07/2021 at 11am.

## 2021-09-07 ENCOUNTER — Ambulatory Visit: Payer: Medicare HMO | Admitting: Internal Medicine

## 2021-09-09 NOTE — Telephone Encounter (Signed)
Patient is still waiting for refills .

## 2021-09-11 ENCOUNTER — Other Ambulatory Visit: Payer: Self-pay | Admitting: Internal Medicine

## 2021-09-12 ENCOUNTER — Telehealth: Payer: Self-pay

## 2021-09-12 DIAGNOSIS — M545 Low back pain, unspecified: Secondary | ICD-10-CM | POA: Diagnosis not present

## 2021-09-12 DIAGNOSIS — M25511 Pain in right shoulder: Secondary | ICD-10-CM | POA: Diagnosis not present

## 2021-09-12 DIAGNOSIS — M542 Cervicalgia: Secondary | ICD-10-CM | POA: Diagnosis not present

## 2021-09-12 DIAGNOSIS — M5416 Radiculopathy, lumbar region: Secondary | ICD-10-CM | POA: Diagnosis not present

## 2021-09-12 DIAGNOSIS — M5412 Radiculopathy, cervical region: Secondary | ICD-10-CM | POA: Diagnosis not present

## 2021-09-12 NOTE — Telephone Encounter (Signed)
Just FYI.   Patient is scheduled twice on your schedule in the next week. I had called in regards to tramadol refill that she did not want, then proceeded to want to be seen to discuss recent neurology appointment & other appointments with PCP. Pt stated that she was hit with a lot from neurology & was happy with appointment he 20th to discuss since she would see Dr. Sharlet Salina by then. Then she called back asked to speak with me & stated that she was in "tail spin" from all she was told at neurology today & really wanted something prescribed for her acute anxiety. I have scheduled her for SAME Day tomorrow at 11:30. I made VERY clear that that this was to discuss an anxiety medication ONLY. Not to discuss all her appointments & what is going on with each provider she is seeing. Pt verbalized understanding & knows that is what the 30 min appointment next Tuesday is for.

## 2021-09-13 ENCOUNTER — Telehealth: Payer: Self-pay | Admitting: Pharmacy Technician

## 2021-09-13 ENCOUNTER — Telehealth: Payer: Self-pay

## 2021-09-13 ENCOUNTER — Encounter: Payer: Self-pay | Admitting: Internal Medicine

## 2021-09-13 ENCOUNTER — Ambulatory Visit (INDEPENDENT_AMBULATORY_CARE_PROVIDER_SITE_OTHER): Payer: Medicare HMO | Admitting: Internal Medicine

## 2021-09-13 VITALS — BP 150/90 | HR 92 | Temp 98.4°F | Ht 60.0 in | Wt 148.0 lb

## 2021-09-13 DIAGNOSIS — I10 Essential (primary) hypertension: Secondary | ICD-10-CM

## 2021-09-13 DIAGNOSIS — G3184 Mild cognitive impairment, so stated: Secondary | ICD-10-CM

## 2021-09-13 DIAGNOSIS — F01A4 Vascular dementia, mild, with anxiety: Secondary | ICD-10-CM | POA: Diagnosis not present

## 2021-09-13 DIAGNOSIS — R69 Illness, unspecified: Secondary | ICD-10-CM | POA: Diagnosis not present

## 2021-09-13 DIAGNOSIS — Z596 Low income: Secondary | ICD-10-CM

## 2021-09-13 MED ORDER — DULOXETINE HCL 20 MG PO CPEP: 20.0000 mg | ORAL_CAPSULE | Freq: Every day | ORAL | 3 refills | Status: DC

## 2021-09-13 NOTE — Assessment & Plan Note (Signed)
MILD   There seems to be an exaggeration of her symptoms due to anxiety, pain and depression.

## 2021-09-13 NOTE — Assessment & Plan Note (Addendum)
Diagnosis is in question and may be pseudodementieuately treated depression.  However given her increased anxiety and panic attacks.  Will increase cymbalta to 60 mg, .  Reduce wellbutrin 300 mg XR to every other day , and continue prn use of hydroxyzine  Follow up in 2 weeks.  Referral to Dr  Nicolasa Ducking

## 2021-09-13 NOTE — Patient Instructions (Addendum)
Reduce wellbutrin to every other day  until gone .  Do not refill  Increase the cymbalta to 60 mg daily (to calm you down)  Return in 2 weeks   I'm making a referral to Dr Nicolasa Ducking

## 2021-09-13 NOTE — Assessment & Plan Note (Signed)
Elevated today to due anxious distress .  No changes to regimen

## 2021-09-13 NOTE — Progress Notes (Signed)
Subjective:  Patient ID: Katherine Jimenez, female    DOB: [birth year]  Age: 71 y.o. MRN: [id number]  CC: The primary encounter diagnosis was Vascular dementia, mild, with anxiety ([place]). Diagnoses of MCI (mild cognitive impairment) with memory loss and Primary hypertension were also pertinent to this visit.   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  She has been  taking wellbutrin/lamictal for history of bipolar disorder  (diagnosed by Dr Bridgett Larsson and managed for years by him) , cymbalta   (started for management of chronic pain,)   and  aricept  for mild cognitive impairment   (which was recently changed to  [facility]  by Dr Trena Platt neurology PA per visit June 6.  Patient has not made the change yet).  Per patient, she has been in a "tailspin" since December , and at the last neurology visit was advised to "get her affairs in order, "  refrain from driving until fully evaluated,  consder long term goal of nursing home placement.  This weekend she had an episode in the grocery store of panic  that resulted in a hot flash and  palpitations .   Hydroxyzine not helping   .  However she is sleeping better  and now has a  Retail banker   2) Elevated B reading:  she is very ansious today.  Home readings have been < 130/80    Outpatient Medications Prior to Visit  Medication Sig Dispense Refill   acetaminophen (TYLENOL) 325 MG tablet Take 650 mg by mouth every 6 (six) hours as needed.     aspirin EC 81 MG tablet Take 81 mg by mouth daily. Swallow whole.     Biotin 5 MG CAPS Take 2 capsules by mouth 2 (two) times daily.      blood glucose meter kit and supplies Dispense Contour next bayer meter. E11.9 To check blood glucose  Once a day. 1 each 0    Cholecalciferol (VITAMIN D3) 1000 units CAPS Take 1,000 Units by mouth daily.      cyanocobalamin (,VITAMIN B-12,) 1000 MCG/ML injection INJECT 1 ML INTO THE MUSCLE EVERY 30 DAYS 3 mL 1   cyclobenzaprine (FLEXERIL) 5 MG tablet TAKE ONE TABLET BY MOUTH THREE TIMES A DAY AS NEEDED FOR MUSCLE SPASMS 30 tablet 0   diphenhydramine-acetaminophen (TYLENOL PM) 25-500 MG TABS tablet Take 1 tablet by mouth at bedtime as needed.     donepezil (ARICEPT) 5 MG tablet Take 5 mg by mouth daily.     DULoxetine HCl 40 MG CPEP TAKE ONE CAPSULE BY MOUTH DAILY 30 capsule 2   glucose blood (FREESTYLE LITE) test strip Use once daily  as instructed to test blood sugar E 11.9 100 each 3   hydrOXYzine (VISTARIL) 25 MG capsule Take 1 capsule (25 mg total) by mouth 3 (three) times daily. 90 capsule 0   lamoTRIgine (LAMICTAL) 200 MG tablet TAKE ONE TABLET BY MOUTH TWICE A DAY 180 tablet 1   Lancets (FREESTYLE) lancets Use once daily to check blood sugars  E11.9 100 each 3   losartan (COZAAR) 50 MG tablet Take 1 tablet (50 mg total) by mouth daily. 90 tablet 3   methocarbamol (ROBAXIN) 500 MG tablet  methocarbamol 500 mg tablet  1 tab daily with food     nitroGLYCERIN (NITROSTAT) 0.4 MG SL tablet Place 1 tablet (0.4 mg total) under the tongue every 5 (five) minutes as needed for chest pain. 50 tablet 3   omeprazole (PRILOSEC) 40 MG capsule TAKE ONE CAPSULE BY MOUTH DAILY 90 capsule 3   Semaglutide,0.25 or 0.5MG/DOS, (OZEMPIC, 0.25 OR 0.5 MG/DOSE,) 2 MG/1.5ML SOPN Inject 0.5 mg into the skin once a week. Inject 0.25 mg weekly for 4 weeks then increase to 0.5 mg weekly 1.5 mL 0   Syringe, Disposable, 1 ML MISC For use with B12 injections 25 each 3   zolpidem (AMBIEN) 10 MG tablet Take 1 tablet (10 mg total) by mouth at bedtime as needed for sleep. 30 tablet 5   buPROPion (WELLBUTRIN XL) 300 MG 24 hr tablet TAKE ONE TABLET BY MOUTH DAILY 30 tablet 1   No facility-administered medications prior to visit.    Review of  Systems;  Patient denies headache, fevers, malaise, unintentional weight loss, skin rash, eye pain, sinus congestion and sinus pain, sore throat, dysphagia,  hemoptysis , cough, dyspnea, wheezing, chest pain, palpitations, orthopnea, edema, abdominal pain, nausea, melena, diarrhea, constipation, flank pain, dysuria, hematuria, urinary  Frequency, nocturia, numbness, tingling, seizures,  Focal weakness, Loss of consciousness,  Tremor, insomnia,  and suicidal ideation.      Objective:  BP (!) 150/90 (BP Location: Left Arm, Patient Position: Sitting, Cuff Size: Normal)   Pulse 92   Temp 98.4 F (36.9 C) (Oral)   Ht 5' (1.524 m)   Wt 148 lb (67.1 kg)   SpO2 96%   BMI 28.90 kg/m   BP Readings from Last 3 Encounters:  09/13/21 (!) 150/90  08/30/21 (!) 152/88  08/06/21 117/65    Wt Readings from Last 3 Encounters:  09/13/21 148 lb (67.1 kg)  08/30/21 148 lb 9.6 oz (67.4 kg)  08/06/21 147 lb (66.7 kg)    General appearance: alert, cooperative and appears stated age Ears: normal TM's and external ear canals both ears Throat: lips, mucosa, and tongue normal; teeth and gums normal Neck: no adenopathy, no carotid bruit, supple, symmetrical, trachea midline and thyroid not enlarged, symmetric, no tenderness/mass/nodules Back: symmetric, no curvature. ROM normal. No CVA tenderness. Lungs: clear to auscultation bilaterally Heart: regular rate and rhythm, S1, S2 normal, no murmur, click, rub or gallop Abdomen: soft, non-tender; bowel sounds normal; no masses,  no organomegaly Pulses: 2+ and symmetric Skin: Skin color, texture, turgor normal. No rashes or lesions Lymph nodes: Cervical, supraclavicular, and axillary nodes normal. Psych: affect anxious and tearful .  makes good eye contact. No fidgeting,  Smiles easily.  Denies suicidal thoughts    Lab Results  Component Value Date   HGBA1C 6.2 07/04/2021   HGBA1C 6.0 (H) 03/22/2021   HGBA1C 5.9 (H) 03/21/2021    Lab Results  Component  Value Date   CREATININE 0.74 08/06/2021   CREATININE 0.77 07/21/2021   CREATININE 0.73 07/04/2021    Lab Results  Component Value Date   WBC 11.4 (H) 08/06/2021   HGB 13.5 08/06/2021   HCT 42.1 08/06/2021   PLT 302 08/06/2021   GLUCOSE 153 (H) 08/06/2021   CHOL 149 05/12/2021   TRIG 119.0 05/12/2021   HDL 86.20 05/12/2021   LDLDIRECT 112.0 10/13/2016   LDLCALC 39 05/12/2021   ALT 19 08/06/2021   AST 19 08/06/2021   NA 133 (L) 08/06/2021   K 4.4 08/06/2021   CL 96 (L) 08/06/2021  CREATININE 0.74 08/06/2021   BUN 14 08/06/2021   CO2 25 08/06/2021   TSH 0.671 08/06/2021   INR 1.0 03/21/2021   HGBA1C 6.2 07/04/2021   MICROALBUR 1.0 07/04/2021    CT HEAD WO CONTRAST (5MM)  Result Date: 08/06/2021 CLINICAL DATA:  Altered mental status.  Confusion. EXAM: CT HEAD WITHOUT CONTRAST TECHNIQUE: Contiguous axial images were obtained from the base of the skull through the vertex without intravenous contrast. RADIATION DOSE REDUCTION: This exam was performed according to the departmental dose-optimization program which includes automated exposure control, adjustment of the mA and/or kV according to patient size and/or use of iterative reconstruction technique. COMPARISON:  03/21/2021 FINDINGS: Brain: No evidence of intracranial hemorrhage, acute infarction, hydrocephalus, extra-axial collection, or mass lesion/mass effect. Mild-to-moderate chronic small vessel disease is stable in appearance. Vascular:  No hyperdense vessel or other acute findings. Skull: No evidence of fracture or other significant bone abnormality. Sinuses/Orbits:  No acute findings. Other: None. IMPRESSION: No acute intracranial abnormality. Stable chronic small vessel disease. Electronically Signed   By: Marlaine Hind M.D.   On: 08/06/2021 13:19   DG Chest 2 View  Result Date: 08/06/2021 CLINICAL DATA:  Chest pain and shortness of breath several months. EXAM: CHEST - 2 VIEW COMPARISON:  03/18/2021 FINDINGS: Lungs are  adequately inflated with no focal airspace consolidation or effusion. Mild vascular crowding over the right infrahilar region. Cardiomediastinal silhouette and remainder of the exam is unchanged. IMPRESSION: No acute cardiopulmonary disease. Electronically Signed   By: Marin Olp M.D.   On: 08/06/2021 12:43    Assessment & Plan:   Problem List Items Addressed This Visit     Vascular dementia, mild, with anxiety ([place]) - Primary    Diagnosis is in question and may be pseudodementieuately treated depression.  However given her increased anxiety and panic attacks.  Will increase cymbalta to 60 mg, .  Reduce wellbutrin 300 mg XR to every other day , and continue prn use of hydroxyzine  Follow up in 2 weeks.  Referral to Dr  Nicolasa Ducking       Relevant Medications   DULoxetine (CYMBALTA) 20 MG capsule   Other Relevant Orders   Ambulatory referral to Psychiatry   MCI (mild cognitive impairment) with memory loss    MILD   There seems to be an exaggeration of her symptoms due to anxiety, pain and depression.        Hypertension    Elevated today to due anxious distress .  No changes to regimen        I spent a total of 30 minutes with this patient in a face to face visit on the date of this encounter reviewing the last office visit with me ,  her most recent with patient's neurology PA , home blood pressure readings ,  most recent imaging study ,   and post visit ordering of testing and therapeutics.    Follow-up: Return in about 2 weeks (around 09/27/2021).   Crecencio Mc, MD

## 2021-09-13 NOTE — Progress Notes (Signed)
[facility])  [place] Team    09/13/2021  DARCEE DEKKER 1950-05-29 [id number]  Reason for referral: Medication Assistance with Ozempic  Referral source: Dr. Derrel Nip Current insurance: [employer]   Outreach:  Successful telephone call with Ms. Piontek.  HIPAA identifiers verified.   Subjective:  [facility] pharmacy received a medication assistance referral for Ozempic by Dr. Derrel Nip. Patient. Patient was approved for [facility] October- December 2022. She has since been receiving samples and still has some on hand. Last documented sampled of Ozempic given around 08/01/2021. Informed patient that Dr. Lupita Dawn office is able to supply 1 more Ozempic box of 0.25 mg or 0.5 mg. No other medications identified for PAP.  Patient reported an estimated household income of $21,600 per year for one person.     Objective: The ASCVD Risk score (Arnett DK, et al., 2019) failed to calculate for the following reasons:   The patient has a prior MI or stroke diagnosis  Lab Results  Component Value Date   CREATININE 0.74 08/06/2021   CREATININE 0.77 07/21/2021   CREATININE 0.73 07/04/2021    Lab Results  Component Value Date   HGBA1C 6.2 07/04/2021    Lipid Panel     Component Value Date/Time   CHOL 149 05/12/2021 0846   TRIG 119.0 05/12/2021 0846   HDL 86.20 05/12/2021 0846   CHOLHDL 2 05/12/2021 0846   VLDL 23.8 05/12/2021 0846   LDLCALC 39 05/12/2021 0846   LDLCALC 153 (H) 04/16/2020 1602   LDLDIRECT 112.0 10/13/2016 0822    BP Readings from Last 3 Encounters:  08/30/21 (!) 152/88  08/06/21 117/65  07/21/21 (!) 142/74    Allergies  Allergen Reactions   Atorvastatin     Liver enzyme elevation    Thimerosal Itching    Medications Reviewed Today     Reviewed by Adair Laundry, CMA (Certified Medical Assistant) on 08/30/21 at 14  Med List Status: <None>   Medication Order Taking? Sig Documenting Provider Last Dose Status Informant  acetaminophen  (TYLENOL) 325 MG tablet [id number] Yes Take 650 mg by mouth every 6 (six) hours as needed. [provider] Taking Active   aspirin EC 81 MG tablet [id number] Yes Take 81 mg by mouth daily. Swallow whole. [provider] Taking Active   Biotin 5 MG CAPS [id number] Yes Take 2 capsules by mouth 2 (two) times daily.  [provider] Taking Active Self  blood glucose meter kit and supplies [id number] Yes Dispense Contour next bayer meter. E11.9 To check blood glucose  Once a day. Crecencio Mc, MD Taking Active Self  buPROPion (WELLBUTRIN XL) 300 MG 24 hr tablet [id number] Yes TAKE ONE TABLET BY MOUTH DAILY Crecencio Mc, MD Taking Active   celecoxib (CELEBREX) 200 MG capsule [id number] Yes TAKE ONE CAPSULE BY MOUTH TWICE A DAY Crecencio Mc, MD Taking Active   Cholecalciferol (VITAMIN D3) 1000 units CAPS [id number] Yes Take 1,000 Units by mouth daily.  [provider] Taking Active Self  cyanocobalamin (,VITAMIN B-12,) 1000 MCG/ML injection [id number] Yes INJECT 1 ML INTO THE MUSCLE EVERY 30 DAYS Crecencio Mc, MD Taking Active   diphenhydramine-acetaminophen (TYLENOL PM) 25-500 MG TABS tablet [id number] Yes Take 1 tablet by mouth at bedtime as needed. [provider] Taking Active   donepezil (ARICEPT) 5 MG tablet [id number] Yes Take 5 mg by mouth daily. [provider] Taking Active   DULoxetine HCl 40 MG CPEP [id number] Yes TAKE ONE CAPSULE BY MOUTH DAILY Deborra Medina  L, MD Taking Active   glucose blood (FREESTYLE LITE) test strip [id number] Yes Use once daily  as instructed to test blood sugar E 11.9 Crecencio Mc, MD Taking Active Self  hydrOXYzine (VISTARIL) 25 MG capsule [id number] Yes Take 1 capsule (25 mg total) by mouth 3 (three) times daily. Crecencio Mc, MD Taking Active   lamoTRIgine (LAMICTAL) 200 MG tablet [id number] Yes TAKE ONE TABLET BY MOUTH TWICE A DAY Crecencio Mc, MD Taking Active   Lancets (FREESTYLE) lancets [id number] Yes Use  once daily to check blood sugars  E11.9 Crecencio Mc, MD Taking Active Self  losartan (COZAAR) 50 MG tablet [id number] Yes Take 1 tablet (50 mg total) by mouth daily. Crecencio Mc, MD Taking Active   methocarbamol (ROBAXIN) 500 MG tablet [id number] Yes methocarbamol 500 mg tablet  1 tab daily with food [provider] Taking Active   nitroGLYCERIN (NITROSTAT) 0.4 MG SL tablet [id number] Yes Place 1 tablet (0.4 mg total) under the tongue every 5 (five) minutes as needed for chest pain. Crecencio Mc, MD Taking Active   omeprazole (PRILOSEC) 40 MG capsule [id number] Yes TAKE ONE CAPSULE BY MOUTH DAILY Crecencio Mc, MD Taking Active   Semaglutide,0.25 or 0.5MG/DOS, (OZEMPIC, 0.25 OR 0.5 MG/DOSE,) 2 MG/1.5ML SOPN [id number] Yes Inject 0.5 mg into the skin once a week. Inject 0.25 mg weekly for 4 weeks then increase to 0.5 mg weekly Crecencio Mc, MD Taking Active   Syringe, Disposable, 1 ML MISC [id number] Yes For use with B12 injections Crecencio Mc, MD Taking Active Self  zolpidem (AMBIEN CR) 6.25 MG CR tablet [id number] Yes Take 1 tablet (6.25 mg total) by mouth at bedtime as needed for sleep. Crecencio Mc, MD Taking Active             Assessment: Given household income, she does not need to apply for LIS and she should qualify for Ozempic patience assistance.   Medication Assistance Findings:  Medication assistance needs identified: Ozempic  Patient Assistance Programs: Ozempic made by [employer] requirement met: Yes Out-of-pocket prescription expenditure met:   Not Applicable Patient has met application requirements to apply for this program.     Additional medication assistance options reviewed with patient as warranted:  No other options identified  Plan: I will route patient assistance letter to [place] technician who will coordinate patient assistance program application process for medications listed above.  [facility] pharmacy technician will assist  with obtaining all required documents from both patient and provider(s) and submit application(s) once completed.   Thank you for allowing pharmacy to be a part of this patient's care.  Kristeen Miss, PharmD Clinical Pharmacist [place] Cell: [phone]

## 2021-09-13 NOTE — Progress Notes (Signed)
[place] [facility])                                            [place] Team    09/13/2021  Katherine Jimenez 06-Apr-1950 [id number]                                      Medication Assistance Referral  Referral From:  [facility] Katherine Jimenez  Medication/Company: Katherine Jimenez / [employer] Patient application portion:  Education officer, museum portion: Faxed  to Dr. Deborra Jimenez Provider address/fax verified via: Office website    Katherine Jimenez P. Katherine Jimenez, [place]  [phone]

## 2021-09-14 ENCOUNTER — Telehealth: Payer: Self-pay

## 2021-09-14 NOTE — Telephone Encounter (Signed)
Medication Samples have been provided to the patient.  Drug name: Ozempic       Strength: 0.5 mg        Qty: 2 boxes  LOT: [id number]  Exp.Date: 04/03/2023  Dosing instructions: Injection 0.5 mg into skin once weekly.   The patient has been instructed regarding the correct time, dose, and frequency of taking this medication, including desired effects and most common side effects.   Mirtha Jain 10:50 AM 09/14/2021    Pt is aware that medication is ready for pick up.

## 2021-09-20 ENCOUNTER — Ambulatory Visit: Payer: Medicare HMO | Admitting: Internal Medicine

## 2021-09-22 ENCOUNTER — Telehealth: Payer: Self-pay | Admitting: Gastroenterology

## 2021-09-22 DIAGNOSIS — M791 Myalgia, unspecified site: Secondary | ICD-10-CM | POA: Diagnosis not present

## 2021-09-22 DIAGNOSIS — E785 Hyperlipidemia, unspecified: Secondary | ICD-10-CM | POA: Diagnosis not present

## 2021-09-22 DIAGNOSIS — R112 Nausea with vomiting, unspecified: Secondary | ICD-10-CM | POA: Diagnosis not present

## 2021-09-22 DIAGNOSIS — R109 Unspecified abdominal pain: Secondary | ICD-10-CM | POA: Diagnosis not present

## 2021-09-22 DIAGNOSIS — R768 Other specified abnormal immunological findings in serum: Secondary | ICD-10-CM | POA: Diagnosis not present

## 2021-09-22 DIAGNOSIS — E119 Type 2 diabetes mellitus without complications: Secondary | ICD-10-CM | POA: Diagnosis not present

## 2021-09-22 DIAGNOSIS — I10 Essential (primary) hypertension: Secondary | ICD-10-CM | POA: Diagnosis not present

## 2021-09-22 DIAGNOSIS — D72829 Elevated white blood cell count, unspecified: Secondary | ICD-10-CM | POA: Diagnosis not present

## 2021-09-22 DIAGNOSIS — R531 Weakness: Secondary | ICD-10-CM | POA: Diagnosis not present

## 2021-09-22 NOTE — Telephone Encounter (Signed)
Patient needs medical advice. Requesting a call back asap.

## 2021-09-23 NOTE — Telephone Encounter (Signed)
Left message on voicemail.

## 2021-09-27 ENCOUNTER — Encounter: Payer: Self-pay | Admitting: Internal Medicine

## 2021-09-27 ENCOUNTER — Ambulatory Visit (INDEPENDENT_AMBULATORY_CARE_PROVIDER_SITE_OTHER): Payer: Medicare HMO | Admitting: Internal Medicine

## 2021-09-27 ENCOUNTER — Telehealth: Payer: Self-pay | Admitting: Pharmacy Technician

## 2021-09-27 DIAGNOSIS — K449 Diaphragmatic hernia without obstruction or gangrene: Secondary | ICD-10-CM | POA: Diagnosis not present

## 2021-09-27 DIAGNOSIS — F3131 Bipolar disorder, current episode depressed, mild: Secondary | ICD-10-CM

## 2021-09-27 DIAGNOSIS — F01A4 Vascular dementia, mild, with anxiety: Secondary | ICD-10-CM | POA: Diagnosis not present

## 2021-09-27 DIAGNOSIS — Z596 Low income: Secondary | ICD-10-CM

## 2021-09-27 DIAGNOSIS — R69 Illness, unspecified: Secondary | ICD-10-CM | POA: Diagnosis not present

## 2021-09-27 MED ORDER — BUSPIRONE HCL 10 MG PO TABS: 10.0000 mg | ORAL_TABLET | Freq: Three times a day (TID) | ORAL | 2 refills | Status: DC

## 2021-09-27 NOTE — Progress Notes (Signed)
[place] [facility])                                            [place] Team    09/27/2021  DELISE SIMENSON [birth year] [id number]  Received both patient and provider portion(s) of patient assistance application(s) for Ozempic. Faxed completed application and required documents into [employer].  Robie Oats P. Thao Vanover, [place]  [phone]

## 2021-09-27 NOTE — Progress Notes (Signed)
Subjective:  Patient ID: Katherine Jimenez, female    DOB: Nov 02, 1950  Age: 71 y.o. MRN: [id number]  CC: Diagnoses of Hiatal hernia, Bipolar affective disorder, currently depressed, mild ([place]), and Vascular dementia, mild, with anxiety ([place]) were pertinent to this visit.   HPI Katherine Jimenez presents for  2 week follow up on medication change  Chief Complaint  Patient presents with   Follow-up    2 week follow up     June 13:  wellbutrin XL reduced to qod  then weaned to off.  cymbalta increased to 60 mg daily.  Referral to [facility] made . Feeling much better   off of wellburin . Passed the driving test. Still has anxiety triggered by granddaughters being disrespectful so she has estranged herself from the    [facility] ER evaluation June 22 for N/V/abd pain. Patient thought the vomitus was coffee grounds.  Was unable to reach GI.Marland Kitchen  CBC normal,  CT abd and pelvis normal.  Moderate hiatal hernia.  Treated for gastritis famotidine added , continue omeprazole Outpatient Medications Prior to Visit  Medication Sig Dispense Refill   acetaminophen (TYLENOL) 325 MG tablet Take 650 mg by mouth every 6 (six) hours as needed.     aspirin EC 81 MG tablet Take 81 mg by mouth daily. Swallow whole.     Biotin 5 MG CAPS Take 2 capsules by mouth 2 (two) times daily.      blood glucose meter kit and supplies Dispense Contour next bayer meter. E11.9 To check blood glucose  Once a day. 1 each 0   Cholecalciferol (VITAMIN D3) 1000 units CAPS Take 1,000 Units by mouth daily.      cyanocobalamin (,VITAMIN B-12,) 1000 MCG/ML injection INJECT 1 ML INTO THE MUSCLE EVERY 30 DAYS 3 mL 1   cyclobenzaprine (FLEXERIL) 5 MG tablet TAKE ONE TABLET BY MOUTH THREE TIMES A DAY AS NEEDED FOR MUSCLE SPASMS 30 tablet 0   diphenhydramine-acetaminophen (TYLENOL PM) 25-500 MG TABS tablet Take 1 tablet by mouth at bedtime as needed.     donepezil (ARICEPT) 5 MG tablet Take 5 mg by mouth daily.     DULoxetine (CYMBALTA) 20 MG capsule Take  1 capsule (20 mg total) by mouth daily. TAKE WITH 40 MG CAPSULE 30 capsule 3   DULoxetine HCl 40 MG CPEP TAKE ONE CAPSULE BY MOUTH DAILY 30 capsule 2   glucose blood (FREESTYLE LITE) test strip Use once daily  as instructed to test blood sugar E 11.9 100 each 3   hydrOXYzine (VISTARIL) 25 MG capsule Take 1 capsule (25 mg total) by mouth 3 (three) times daily. 90 capsule 0   lamoTRIgine (LAMICTAL) 200 MG tablet TAKE ONE TABLET BY MOUTH TWICE A DAY 180 tablet 1   Lancets (FREESTYLE) lancets Use once daily to check blood sugars  E11.9 100 each 3   losartan (COZAAR) 50 MG tablet Take 1 tablet (50 mg total) by mouth daily. 90 tablet 3   nitroGLYCERIN (NITROSTAT) 0.4 MG SL tablet Place 1 tablet (0.4 mg total) under the tongue every 5 (five) minutes as needed for chest pain. 50 tablet 3   omeprazole (PRILOSEC) 40 MG capsule TAKE ONE CAPSULE BY MOUTH DAILY 90 capsule 3   Semaglutide,0.25 or 0.5MG/DOS, (OZEMPIC, 0.25 OR 0.5 MG/DOSE,) 2 MG/1.5ML SOPN Inject 0.5 mg into the skin once a week. Inject 0.25 mg weekly for 4 weeks then increase to 0.5 mg weekly 1.5 mL 0   Syringe, Disposable, 1 ML MISC For use  with B12 injections 25 each 3   zolpidem (AMBIEN) 10 MG tablet Take 1 tablet (10 mg total) by mouth at bedtime as needed for sleep. 30 tablet 5   methocarbamol (ROBAXIN) 500 MG tablet methocarbamol 500 mg tablet  1 tab daily with food (Patient not taking: Reported on 09/27/2021)     No facility-administered medications prior to visit.    Review of Systems;  Patient denies headache, fevers, malaise, unintentional weight loss, skin rash, eye pain, sinus congestion and sinus pain, sore throat, dysphagia,  hemoptysis , cough, dyspnea, wheezing, chest pain, palpitations, orthopnea, edema, abdominal pain, nausea, melena, diarrhea, constipation, flank pain, dysuria, hematuria, urinary  Frequency, nocturia, numbness, tingling, seizures,  Focal weakness, Loss of consciousness,  Tremor, insomnia, depression, anxiety,  and suicidal ideation.      Objective:  BP 126/84 (BP Location: Left Arm, Patient Position: Sitting, Cuff Size: Normal)   Pulse 92   Temp 98.1 F (36.7 C) (Oral)   Ht 5' (1.524 m)   Wt 148 lb 12.8 oz (67.5 kg)   SpO2 98%   BMI 29.06 kg/m   BP Readings from Last 3 Encounters:  09/27/21 126/84  09/13/21 (!) 150/90  08/30/21 (!) 152/88    Wt Readings from Last 3 Encounters:  09/27/21 148 lb 12.8 oz (67.5 kg)  09/13/21 148 lb (67.1 kg)  08/30/21 148 lb 9.6 oz (67.4 kg)    General appearance: alert, cooperative and appears stated age Ears: normal TM's and external ear canals both ears Throat: lips, mucosa, and tongue normal; teeth and gums normal Neck: no adenopathy, no carotid bruit, supple, symmetrical, trachea midline and thyroid not enlarged, symmetric, no tenderness/mass/nodules Back: symmetric, no curvature. ROM normal. No CVA tenderness. Lungs: clear to auscultation bilaterally Heart: regular rate and rhythm, S1, S2 normal, no murmur, click, rub or gallop Abdomen: soft, non-tender; bowel sounds normal; no masses,  no organomegaly Pulses: 2+ and symmetric Skin: Skin color, texture, turgor normal. No rashes or lesions Lymph nodes: Cervical, supraclavicular, and axillary nodes normal.  Lab Results  Component Value Date   HGBA1C 6.2 07/04/2021   HGBA1C 6.0 (H) 03/22/2021   HGBA1C 5.9 (H) 03/21/2021    Lab Results  Component Value Date   CREATININE 0.74 08/06/2021   CREATININE 0.77 07/21/2021   CREATININE 0.73 07/04/2021    Lab Results  Component Value Date   WBC 11.4 (H) 08/06/2021   HGB 13.5 08/06/2021   HCT 42.1 08/06/2021   PLT 302 08/06/2021   GLUCOSE 153 (H) 08/06/2021   CHOL 149 05/12/2021   TRIG 119.0 05/12/2021   HDL 86.20 05/12/2021   LDLDIRECT 112.0 10/13/2016   LDLCALC 39 05/12/2021   ALT 19 08/06/2021   AST 19 08/06/2021   NA 133 (L) 08/06/2021   K 4.4 08/06/2021   CL 96 (L) 08/06/2021   CREATININE 0.74 08/06/2021   BUN 14 08/06/2021    CO2 25 08/06/2021   TSH 0.671 08/06/2021   INR 1.0 03/21/2021   HGBA1C 6.2 07/04/2021   MICROALBUR 1.0 07/04/2021    CT HEAD WO CONTRAST (5MM)  Result Date: 08/06/2021 CLINICAL DATA:  Altered mental status.  Confusion. EXAM: CT HEAD WITHOUT CONTRAST TECHNIQUE: Contiguous axial images were obtained from the base of the skull through the vertex without intravenous contrast. RADIATION DOSE REDUCTION: This exam was performed according to the departmental dose-optimization program which includes automated exposure control, adjustment of the mA and/or kV according to patient size and/or use of iterative reconstruction technique. COMPARISON:  03/21/2021 FINDINGS: Brain:  No evidence of intracranial hemorrhage, acute infarction, hydrocephalus, extra-axial collection, or mass lesion/mass effect. Mild-to-moderate chronic small vessel disease is stable in appearance. Vascular:  No hyperdense vessel or other acute findings. Skull: No evidence of fracture or other significant bone abnormality. Sinuses/Orbits:  No acute findings. Other: None. IMPRESSION: No acute intracranial abnormality. Stable chronic small vessel disease. Electronically Signed   By: Marlaine Hind M.D.   On: 08/06/2021 13:19   DG Chest 2 View  Result Date: 08/06/2021 CLINICAL DATA:  Chest pain and shortness of breath several months. EXAM: CHEST - 2 VIEW COMPARISON:  03/18/2021 FINDINGS: Lungs are adequately inflated with no focal airspace consolidation or effusion. Mild vascular crowding over the right infrahilar region. Cardiomediastinal silhouette and remainder of the exam is unchanged. IMPRESSION: No acute cardiopulmonary disease. Electronically Signed   By: Marin Olp M.D.   On: 08/06/2021 12:43    Assessment & Plan:   Problem List Items Addressed This Visit     Vascular dementia, mild, with anxiety ([place])    Improved anxiety since passing her drivign test .  Has stopped Namenda and resume aricept       Relevant Medications    busPIRone (BUSPAR) 10 MG tablet   Bipolar disorder ([place])    She feels less anxious since weaning off of wellbutrin, but continues to need medication for anxiety and insomnia   Adding buspirone 10 mg bid/tid .  continue cymbalta 60 mg daily and lamictal.       Hiatal hernia    Moderate, noted on CT  Done at [place] .  Behavior /lifestyle modification and adding famotidine prn discussed        I spent a total of   30   minutes with this patient in a face to face visit on the date of this encounter reviewing the last office visit with me on   June 13  most recent ER Visit June 22 at [facility],   patient's diet and eating habits, most recent imaging study (CT ab and pelvis), and new diagnosis of Hiatal hernia.  Follow-up: Return in about 3 months (around 12/28/2021).   Crecencio Mc, MD

## 2021-09-27 NOTE — Patient Instructions (Signed)
I'm Glad you are off the wellbutrin and tolerating the Cymbalta higher dose  I am adding buspirone. This is for anxiety .  You can take it up to 3 times daily.  The dose can be increased or decreased depending on your preference.  You can take a higher dose in the evening to help you relax for bed

## 2021-09-27 NOTE — Assessment & Plan Note (Signed)
Moderate, noted on CT  Done at [facility] ER .  Behavior /lifestyle modification and adding famotidine prn discussed

## 2021-09-27 NOTE — Assessment & Plan Note (Signed)
Improved anxiety since passing her drivign test .  Has stopped Namenda and resume aricept

## 2021-09-27 NOTE — Assessment & Plan Note (Addendum)
She feels less anxious since weaning off of wellbutrin, but continues to need medication for anxiety and insomnia   Adding buspirone 10 mg bid/tid .  continue cymbalta 60 mg daily and lamictal.

## 2021-09-30 NOTE — Telephone Encounter (Signed)
Left message on voicemail.

## 2021-10-05 ENCOUNTER — Ambulatory Visit: Payer: Medicare HMO | Admitting: Internal Medicine

## 2021-10-05 DIAGNOSIS — M48062 Spinal stenosis, lumbar region with neurogenic claudication: Secondary | ICD-10-CM | POA: Diagnosis not present

## 2021-10-05 DIAGNOSIS — M5416 Radiculopathy, lumbar region: Secondary | ICD-10-CM | POA: Diagnosis not present

## 2021-10-07 ENCOUNTER — Telehealth: Payer: Self-pay | Admitting: Pharmacy Technician

## 2021-10-07 DIAGNOSIS — Z596 Low income: Secondary | ICD-10-CM

## 2021-10-07 NOTE — Progress Notes (Signed)
[facility])                                            [place] Team    10/07/2021  SHANEYA TAKETA 1950/04/29 [id number]  Care coordination call placed to [place] in regard to [place] application.  Spoke to [place] who informs patient is APPROVED 10/07/21-04/02/22. She informs 5 boxes of the medication will begin processing for shipment on 10/11/21 and should be delivered to the provider's office in the next 10-14 business days from 10/11/21. Patient's id number is [id number]. Patient is aware of approval.  Lovella Hardie P. Jayne Peckenpaugh, [place]  [phone]

## 2021-10-10 DIAGNOSIS — M19071 Primary osteoarthritis, right ankle and foot: Secondary | ICD-10-CM | POA: Diagnosis not present

## 2021-10-10 DIAGNOSIS — Q66211 Congenital metatarsus primus varus, right foot: Secondary | ICD-10-CM | POA: Diagnosis not present

## 2021-10-10 DIAGNOSIS — M2042 Other hammer toe(s) (acquired), left foot: Secondary | ICD-10-CM | POA: Diagnosis not present

## 2021-10-10 DIAGNOSIS — Q66212 Congenital metatarsus primus varus, left foot: Secondary | ICD-10-CM | POA: Diagnosis not present

## 2021-10-10 DIAGNOSIS — M19072 Primary osteoarthritis, left ankle and foot: Secondary | ICD-10-CM | POA: Diagnosis not present

## 2021-10-10 DIAGNOSIS — M2012 Hallux valgus (acquired), left foot: Secondary | ICD-10-CM | POA: Diagnosis not present

## 2021-10-10 DIAGNOSIS — M2011 Hallux valgus (acquired), right foot: Secondary | ICD-10-CM | POA: Diagnosis not present

## 2021-10-10 DIAGNOSIS — M2041 Other hammer toe(s) (acquired), right foot: Secondary | ICD-10-CM | POA: Diagnosis not present

## 2021-10-13 ENCOUNTER — Telehealth: Payer: Self-pay

## 2021-10-13 NOTE — Telephone Encounter (Signed)
LMTCB to try toi et patient scheduled for virtual AWV with Dr. Maudie Mercury at [place].

## 2021-10-13 NOTE — Telephone Encounter (Signed)
Pt now scheduled for AWV.

## 2021-10-18 DIAGNOSIS — M79672 Pain in left foot: Secondary | ICD-10-CM | POA: Diagnosis not present

## 2021-10-20 DIAGNOSIS — E119 Type 2 diabetes mellitus without complications: Secondary | ICD-10-CM | POA: Diagnosis not present

## 2021-10-20 DIAGNOSIS — M2011 Hallux valgus (acquired), right foot: Secondary | ICD-10-CM | POA: Diagnosis not present

## 2021-10-20 DIAGNOSIS — M19072 Primary osteoarthritis, left ankle and foot: Secondary | ICD-10-CM | POA: Diagnosis not present

## 2021-10-20 DIAGNOSIS — M2042 Other hammer toe(s) (acquired), left foot: Secondary | ICD-10-CM | POA: Diagnosis not present

## 2021-10-20 DIAGNOSIS — M2012 Hallux valgus (acquired), left foot: Secondary | ICD-10-CM | POA: Diagnosis not present

## 2021-10-21 ENCOUNTER — Encounter: Payer: Self-pay | Admitting: Emergency Medicine

## 2021-10-21 ENCOUNTER — Ambulatory Visit
Admission: EM | Admit: 2021-10-21 | Discharge: 2021-10-21 | Disposition: A | Discharge status: Home / Self Care | Payer: Medicare HMO

## 2021-10-21 DIAGNOSIS — I10 Essential (primary) hypertension: Secondary | ICD-10-CM

## 2021-10-21 DIAGNOSIS — H1032 Unspecified acute conjunctivitis, left eye: Secondary | ICD-10-CM | POA: Diagnosis not present

## 2021-10-21 MED ORDER — POLYMYXIN B-TRIMETHOPRIM 10000-0.1 UNIT/ML-% OP SOLN: 1.0000 [drp] | Freq: Three times a day (TID) | OPHTHALMIC | 0 refills | Status: AC

## 2021-10-21 NOTE — ED Provider Notes (Signed)
Katherine Jimenez    CSN: [id number] Arrival date & time: 10/21/21  1023      History   Chief Complaint Chief Complaint  Patient presents with   Eye Problem    HPI Katherine Jimenez is a 71 y.o. female.   HPI Patient presents today for evaluation of two days of left eye irritation. Patient endorses redness, itching, blurry of vision, and discharge which is crusty. She used OTC eye drops, with minimal relief. Denies any known sick contacts.   Past Medical History:  Diagnosis Date   Arthritis    back, knees, fingers   Bipolar disorder ([place])    Bunion, left    Depression    Diabetes mellitus    diet controlled.    Elevated liver enzymes 05/31/2021   Hypertension    Lab test positive for detection of COVID-19 virus 03/02/2021   Lower back pain    S/P fall   Squamous cell skin cancer, face    [facility]   Treadmill stress test negative for angina pectoris June 2013    Patient Active Problem List   Diagnosis Date Noted   Hiatal hernia 09/27/2021   Pain of left midfoot 07/23/2021   Coronary atherosclerosis due to calcified coronary lesion (CODE) 05/30/2021   Thoracic aortic atherosclerosis ([place]) 05/30/2021   Pain of left thigh 04/06/2021   Hospital discharge follow-up 04/06/2021   History of CVA (cerebrovascular accident) without residual deficits 03/21/2021   Abnormal MRI of head 03/21/2021   MCI (mild cognitive impairment) with memory loss 03/21/2021   History of COVID-19 03/21/2021   Attention and concentration deficit following other cerebrovascular disease 03/21/2021   Shortness of breath 03/02/2021   History of chest pain 03/02/2021   COVID-19 long hauler manifesting chronic joint pain 01/08/2021   Hyperlipidemia associated with type 2 diabetes mellitus ([place]) 07/28/2020   Clavicular enlargement 08/03/2019   Vascular dementia, mild, with anxiety ([place]) 10/16/2018   Osteoarthritis of right knee 06/04/2017   Idiopathic scoliosis 12/06/2015   Chest pain  08/29/2015   Insomnia secondary to depression with anxiety 05/29/2015   Palpitations 03/02/2015   Fibromyalgia 01/30/2015   B12 deficiency 12/23/2014   Cystocele 08/24/2013   Preoperative evaluation to rule out surgical contraindication 01/21/2013   Overweight (BMI 25.0-29.9) 01/21/2013   Degenerative lumbar spinal stenosis 09/24/2011   Hypertension 08/31/2011   Bipolar disorder ([place]) 08/31/2011   Iron deficiency anemia 08/31/2011    Past Surgical History:  Procedure Laterality Date   BLADDER AUGMENTATION     BLADDER SURGERY     CARDIAC CATHETERIZATION  03/05/12   no stents   CATARACT EXTRACTION W/PHACO Right 07/19/2015   Procedure: CATARACT EXTRACTION PHACO AND INTRAOCULAR LENS PLACEMENT ([place]) right eye;  Surgeon: Ronnell Freshwater, MD;  Location: [place];  Service: Ophthalmology;  Laterality: Right;  BORDERLINE DIABETIC - oral meds   CATARACT EXTRACTION W/PHACO Left 05/28/2017   Procedure: CATARACT EXTRACTION PHACO AND INTRAOCULAR LENS PLACEMENT ([place]) LEFT DIABETIC;  Surgeon: Leandrew Koyanagi, MD;  Location: [place];  Service: Ophthalmology;  Laterality: Left;  Diabetic - oal meds   JOINT REPLACEMENT  2012   bilateral hip   KNEE ARTHROSCOPY     TOTAL HIP ARTHROPLASTY  2012   TOTAL KNEE ARTHROPLASTY Right 06/04/2017   Procedure: TOTAL KNEE ARTHROPLASTY;  Surgeon: Lovell Sheehan, MD;  Location: [facility];  Service: Orthopedics;  Laterality: Right;   TUBAL LIGATION     VAGINAL DELIVERY     2  OB History   No obstetric history on file.      Home Medications    Prior to Admission medications   Medication Sig Start Date End Date Taking? Authorizing Provider  aspirin EC 81 MG tablet Take 81 mg by mouth daily. Swallow whole.   Yes [provider]  Biotin 5 MG CAPS Take 2 capsules by mouth 2 (two) times daily.    Yes [provider]  Cholecalciferol (VITAMIN D3) 1000 units CAPS Take 1,000 Units by mouth daily.    Yes [provider]  cyanocobalamin (,VITAMIN B-12,) 1000 MCG/ML injection INJECT 1 ML INTO THE MUSCLE EVERY 30 DAYS 05/03/21  Yes Crecencio Mc, MD  donepezil (ARICEPT) 5 MG tablet Take 5 mg by mouth daily. 06/27/21  Yes [provider]  DULoxetine HCl 40 MG CPEP TAKE ONE CAPSULE BY MOUTH DAILY 08/30/21  Yes Crecencio Mc, MD  lamoTRIgine (LAMICTAL) 200 MG tablet TAKE ONE TABLET BY MOUTH TWICE A DAY 06/20/21  Yes Crecencio Mc, MD  losartan (COZAAR) 50 MG tablet Take 1 tablet (50 mg total) by mouth daily. 05/30/21  Yes Crecencio Mc, MD  naproxen sodium (ALEVE) 220 MG tablet Take 220 mg by mouth daily as needed.   Yes [provider]  omeprazole (PRILOSEC) 40 MG capsule TAKE ONE CAPSULE BY MOUTH DAILY 01/10/21  Yes Crecencio Mc, MD  Semaglutide,0.25 or 0.5MG/DOS, (OZEMPIC, 0.25 OR 0.5 MG/DOSE,) 2 MG/1.5ML SOPN Inject 0.5 mg into the skin once a week. Inject 0.25 mg weekly for 4 weeks then increase to 0.5 mg weekly 02/10/21  Yes Crecencio Mc, MD  trimethoprim-polymyxin b (POLYTRIM) ophthalmic solution Place 1 drop into the left eye in the morning, at noon, and at bedtime for 5 days. 10/21/21 10/26/21 Yes Scot Jun, FNP  zolpidem (AMBIEN) 10 MG tablet Take 1 tablet (10 mg total) by mouth at bedtime as needed for sleep. 08/30/21 10/21/21 Yes Crecencio Mc, MD  acetaminophen (TYLENOL) 325 MG tablet Take 650 mg by mouth every 6 (six) hours as needed.    [provider]  blood glucose meter kit and supplies Dispense Contour next bayer meter. E11.9 To check blood glucose  Once a day. 08/31/15   Crecencio Mc, MD  busPIRone (BUSPAR) 10 MG tablet Take 1 tablet (10 mg total) by mouth 3 (three) times daily. 09/27/21   Crecencio Mc, MD  cyclobenzaprine (FLEXERIL) 5 MG tablet TAKE ONE TABLET BY MOUTH THREE TIMES A DAY AS NEEDED FOR MUSCLE SPASMS 09/12/21   Crecencio Mc, MD  diphenhydramine-acetaminophen (TYLENOL PM) 25-500 MG TABS tablet Take 1 tablet by mouth at bedtime  as needed.    [provider]  DULoxetine (CYMBALTA) 20 MG capsule Take 1 capsule (20 mg total) by mouth daily. TAKE WITH 40 MG CAPSULE 09/13/21   Crecencio Mc, MD  glucose blood (FREESTYLE LITE) test strip Use once daily  as instructed to test blood sugar E 11.9 04/29/14   Crecencio Mc, MD  hydrOXYzine (VISTARIL) 25 MG capsule Take 1 capsule (25 mg total) by mouth 3 (three) times daily. 07/04/21   Crecencio Mc, MD  Lancets (FREESTYLE) lancets Use once daily to check blood sugars  E11.9 04/29/14   Crecencio Mc, MD  nitroGLYCERIN (NITROSTAT) 0.4 MG SL tablet Place 1 tablet (0.4 mg total) under the tongue every 5 (five) minutes as needed for chest pain. 04/06/21   Crecencio Mc, MD  Syringe, Disposable, 1 ML  MISC For use with B12 injections 12/23/14   Crecencio Mc, MD    Family History Family History  Problem Relation Age of Onset   Heart disease Mother    Stroke Mother    Heart disease Father    Dementia Father    Heart disease Maternal Grandmother    Heart disease Maternal Grandfather    Heart disease Paternal Grandmother    Heart disease Paternal Grandfather    Breast cancer Maternal Aunt     Social History Social History   Tobacco Use   Smoking status: Never   Smokeless tobacco: Never  Vaping Use   Vaping Use: Never used  Substance Use Topics   Alcohol use: Yes    Alcohol/week: 1.0 standard drink of alcohol    Types: 1 Cans of beer per week    Comment: occassionally   Drug use: No     Allergies   Atorvastatin and Thimerosal   Review of Systems Review of Systems Pertinent negatives listed in HPI   Physical Exam Triage Vital Signs ED Triage Vitals  Enc Vitals Group     BP 10/21/21 1032 (!) 168/100     Pulse Rate 10/21/21 1032 85     Resp 10/21/21 1032 16     Temp 10/21/21 1032 98.6 F (37 C)     Temp Source 10/21/21 1032 Oral     SpO2 10/21/21 1032 95 %     Weight --      Height --      Head Circumference --      Peak Flow --      Pain  Score 10/21/21 1035 0     Pain Loc --      Pain Edu? --      Excl. in [place]? --    No data found.  Updated Vital Signs BP (!) 168/100 (BP Location: Right Arm)   Pulse 85   Temp 98.6 F (37 C) (Oral)   Resp 16   SpO2 95%   Visual Acuity Right Eye Distance: 20/100 Left Eye Distance: 20/40 Bilateral Distance: 20/40  Right Eye Near:   Left Eye Near:    Bilateral Near:     Physical Exam Vitals reviewed.  Constitutional:      Appearance: Normal appearance.  Eyes:     General:        Left eye: Discharge present.    Comments: Scleral erythema present.   Cardiovascular:     Rate and Rhythm: Normal rate and regular rhythm.  Pulmonary:     Effort: Pulmonary effort is normal.     Breath sounds: Normal breath sounds.  Skin:    Capillary Refill: Capillary refill takes less than 2 seconds.  Neurological:     General: No focal deficit present.     Mental Status: She is alert and oriented to person, place, and time.  Psychiatric:        Mood and Affect: Mood normal.        Behavior: Behavior normal.    UC Treatments / Results  Labs (all labs ordered are listed, but only abnormal results are displayed) Labs Reviewed - No data to display  EKG   Radiology No results found.  Procedures Procedures (including critical care time)  Medications Ordered in UC Medications - No data to display  Initial Impression / Assessment and Plan / UC Course  I have reviewed the triage vital signs and the nursing notes.  Pertinent labs & imaging results that were available  during my care of the patient were reviewed by me and considered in my medical decision making (see chart for details).    Acute Bacterial Conjuctivitis of left eye Elevated blood pressure without diagnosis of hypertension, on recheck blood pressure improved to 140/94. Patient monitors blood pressure at home continue to monitor and continue home meds and follow-up with PCP as needed.  Final Clinical Impressions(s) / UC  Diagnoses   Final diagnoses:  Acute bacterial conjunctivitis of left eye  Elevated blood pressure reading in office with diagnosis of hypertension   Discharge Instructions   None    ED Prescriptions     Medication Sig Dispense Auth. Provider   trimethoprim-polymyxin b (POLYTRIM) ophthalmic solution Place 1 drop into the left eye in the morning, at noon, and at bedtime for 5 days. 10 mL Scot Jun, FNP      PDMP not reviewed this encounter.   Scot Jun, FNP 10/21/21 1102

## 2021-10-21 NOTE — ED Triage Notes (Signed)
Patient c/o LFT eye irritation and redness x 2 days.   Patient denies eye trauma. Patient denies eye pain.   Patient endorses clear drainage. Patient endorses eye crustiness.   Patient endorses itchiness.   Patient endorses eye blurriness in the morning.   Patient has used OTC eye drops with some relief of symptoms.

## 2021-10-27 ENCOUNTER — Encounter: Payer: Self-pay | Admitting: Emergency Medicine

## 2021-10-27 ENCOUNTER — Ambulatory Visit
Admission: EM | Admit: 2021-10-27 | Discharge: 2021-10-27 | Disposition: A | Discharge status: Home / Self Care | Payer: Medicare HMO | Attending: Emergency Medicine | Admitting: Emergency Medicine

## 2021-10-27 DIAGNOSIS — B9689 Other specified bacterial agents as the cause of diseases classified elsewhere: Secondary | ICD-10-CM

## 2021-10-27 DIAGNOSIS — H109 Unspecified conjunctivitis: Secondary | ICD-10-CM | POA: Diagnosis not present

## 2021-10-27 MED ORDER — CEPHALEXIN 500 MG PO CAPS: 500.0000 mg | ORAL_CAPSULE | Freq: Two times a day (BID) | ORAL | 0 refills | Status: AC

## 2021-10-27 MED ORDER — MOXIFLOXACIN HCL 0.5 % OP SOLN: 1.0000 [drp] | Freq: Three times a day (TID) | OPHTHALMIC | 0 refills | Status: DC

## 2021-10-27 NOTE — Discharge Instructions (Addendum)
Today you being treated for bacterial conjunctivitis.   Place 1 drop of moxifloxacin in the eye every 8 hours for the next 7 days  Begin Keflex every morning and every evening for the next 5 days to cover for bacteria around the skin  May use cool compress for comfort and to remove discharge if present. Pat the eye, do not wipe.  If wearing contacts, dispose of current pair. Wear glasses until symptoms have resolved.   Do not rub eyes, this may cause more irritation.  May use benadryl as needed to help if itching present.  Please avoid use of eye makeup until symptoms clear.  If you not begin to see improvement in symptoms by Monday please schedule an appointment with ophthalmology, information is on your paperwork

## 2021-10-27 NOTE — ED Provider Notes (Signed)
Roderic Palau    CSN: [id number] Arrival date & time: 10/27/21  0810      History   Chief Complaint Chief Complaint  Patient presents with   Eye Problem    HPI Katherine Jimenez is a 71 y.o. female.   Patient presents with bilateral eye pruritus, drainage with crusting and irritation with blurry vision for 9 days.  Was evaluated in urgent care and attempted use of Polytrim which has been ineffective.  Denies light sensitivity.  No known sick contacts.  Does wear contact in the right eye but has not been using since symptoms began.  Endorses she is also stopped wearing make-up.  Past Medical History:  Diagnosis Date   Arthritis    back, knees, fingers   Bipolar disorder ([place])    Bunion, left    Depression    Diabetes mellitus    diet controlled.    Elevated liver enzymes 05/31/2021   Hypertension    Lab test positive for detection of COVID-19 virus 03/02/2021   Lower back pain    S/P fall   Squamous cell skin cancer, face    [facility]   Treadmill stress test negative for angina pectoris June 2013    Patient Active Problem List   Diagnosis Date Noted   Hiatal hernia 09/27/2021   Pain of left midfoot 07/23/2021   Coronary atherosclerosis due to calcified coronary lesion (CODE) 05/30/2021   Thoracic aortic atherosclerosis ([place]) 05/30/2021   Pain of left thigh 04/06/2021   Hospital discharge follow-up 04/06/2021   History of CVA (cerebrovascular accident) without residual deficits 03/21/2021   Abnormal MRI of head 03/21/2021   MCI (mild cognitive impairment) with memory loss 03/21/2021   History of COVID-19 03/21/2021   Attention and concentration deficit following other cerebrovascular disease 03/21/2021   Shortness of breath 03/02/2021   History of chest pain 03/02/2021   COVID-19 long hauler manifesting chronic joint pain 01/08/2021   Hyperlipidemia associated with type 2 diabetes mellitus ([place]) 07/28/2020   Clavicular enlargement 08/03/2019   Vascular  dementia, mild, with anxiety ([place]) 10/16/2018   Osteoarthritis of right knee 06/04/2017   Idiopathic scoliosis 12/06/2015   Chest pain 08/29/2015   Insomnia secondary to depression with anxiety 05/29/2015   Palpitations 03/02/2015   Fibromyalgia 01/30/2015   B12 deficiency 12/23/2014   Cystocele 08/24/2013   Preoperative evaluation to rule out surgical contraindication 01/21/2013   Overweight (BMI 25.0-29.9) 01/21/2013   Degenerative lumbar spinal stenosis 09/24/2011   Hypertension 08/31/2011   Bipolar disorder ([place]) 08/31/2011   Iron deficiency anemia 08/31/2011    Past Surgical History:  Procedure Laterality Date   BLADDER AUGMENTATION     BLADDER SURGERY     CARDIAC CATHETERIZATION  03/05/12   no stents   CATARACT EXTRACTION W/PHACO Right 07/19/2015   Procedure: CATARACT EXTRACTION PHACO AND INTRAOCULAR LENS PLACEMENT ([place]) right eye;  Surgeon: Ronnell Freshwater, MD;  Location: [facility];  Service: Ophthalmology;  Laterality: Right;  BORDERLINE DIABETIC - oral meds   CATARACT EXTRACTION W/PHACO Left 05/28/2017   Procedure: CATARACT EXTRACTION PHACO AND INTRAOCULAR LENS PLACEMENT ([place]) LEFT DIABETIC;  Surgeon: Leandrew Koyanagi, MD;  Location: [place] [facility];  Service: Ophthalmology;  Laterality: Left;  Diabetic - oal meds   JOINT REPLACEMENT  2012   bilateral hip   KNEE ARTHROSCOPY     TOTAL HIP ARTHROPLASTY  2012   TOTAL KNEE ARTHROPLASTY Right 06/04/2017   Procedure: TOTAL KNEE ARTHROPLASTY;  Surgeon: Lovell Sheehan, MD;  Location:  [facility];  Service: Orthopedics;  Laterality: Right;   TUBAL LIGATION     VAGINAL DELIVERY     2    OB History   No obstetric history on file.      Home Medications    Prior to Admission medications   Medication Sig Start Date End Date Taking? Authorizing Provider  aspirin EC 81 MG tablet Take 81 mg by mouth daily. Swallow whole.   Yes [provider]  busPIRone (BUSPAR) 10 MG tablet Take 1 tablet (10 mg  total) by mouth 3 (three) times daily. 09/27/21  Yes Crecencio Mc, MD  cephALEXin (KEFLEX) 500 MG capsule Take 1 capsule (500 mg total) by mouth 2 (two) times daily for 5 days. 10/27/21 11/01/21 Yes Denzel Etienne, Leitha Schuller, NP  cyclobenzaprine (FLEXERIL) 5 MG tablet TAKE ONE TABLET BY MOUTH THREE TIMES A DAY AS NEEDED FOR MUSCLE SPASMS 09/12/21  Yes Crecencio Mc, MD  donepezil (ARICEPT) 5 MG tablet Take 5 mg by mouth daily. 06/27/21  Yes [provider]  DULoxetine (CYMBALTA) 20 MG capsule Take 1 capsule (20 mg total) by mouth daily. TAKE WITH 40 MG CAPSULE 09/13/21  Yes Crecencio Mc, MD  DULoxetine HCl 40 MG CPEP TAKE ONE CAPSULE BY MOUTH DAILY 08/30/21  Yes Crecencio Mc, MD  hydrOXYzine (VISTARIL) 25 MG capsule Take 1 capsule (25 mg total) by mouth 3 (three) times daily. 07/04/21  Yes Crecencio Mc, MD  lamoTRIgine (LAMICTAL) 200 MG tablet TAKE ONE TABLET BY MOUTH TWICE A DAY 06/20/21  Yes Crecencio Mc, MD  losartan (COZAAR) 50 MG tablet Take 1 tablet (50 mg total) by mouth daily. 05/30/21  Yes Crecencio Mc, MD  moxifloxacin (VIGAMOX) 0.5 % ophthalmic solution Place 1 drop into both eyes 3 (three) times daily. 10/27/21  Yes Lurena Naeve R, NP  omeprazole (PRILOSEC) 40 MG capsule TAKE ONE CAPSULE BY MOUTH DAILY 01/10/21  Yes Crecencio Mc, MD  Semaglutide,0.25 or 0.5MG/DOS, (OZEMPIC, 0.25 OR 0.5 MG/DOSE,) 2 MG/1.5ML SOPN Inject 0.5 mg into the skin once a week. Inject 0.25 mg weekly for 4 weeks then increase to 0.5 mg weekly 02/10/21  Yes Crecencio Mc, MD  acetaminophen (TYLENOL) 325 MG tablet Take 650 mg by mouth every 6 (six) hours as needed.    [provider]  Biotin 5 MG CAPS Take 2 capsules by mouth 2 (two) times daily.     [provider]  blood glucose meter kit and supplies Dispense Contour next bayer meter. E11.9 To check blood glucose  Once a day. 08/31/15   Crecencio Mc, MD  Cholecalciferol (VITAMIN D3) 1000 units CAPS Take 1,000 Units by mouth daily.      [provider]  cyanocobalamin (,VITAMIN B-12,) 1000 MCG/ML injection INJECT 1 ML INTO THE MUSCLE EVERY 30 DAYS 05/03/21   Crecencio Mc, MD  diphenhydramine-acetaminophen (TYLENOL PM) 25-500 MG TABS tablet Take 1 tablet by mouth at bedtime as needed.    [provider]  glucose blood (FREESTYLE LITE) test strip Use once daily  as instructed to test blood sugar E 11.9 04/29/14   Crecencio Mc, MD  Lancets (FREESTYLE) lancets Use once daily to check blood sugars  E11.9 04/29/14   Crecencio Mc, MD  naproxen sodium (ALEVE) 220 MG tablet Take 220 mg by mouth daily as needed.    [provider]  nitroGLYCERIN (NITROSTAT) 0.4 MG SL tablet Place 1 tablet (0.4 mg total) under the tongue every  5 (five) minutes as needed for chest pain. 04/06/21   Crecencio Mc, MD  Syringe, Disposable, 1 ML MISC For use with B12 injections 12/23/14   Crecencio Mc, MD  zolpidem (AMBIEN) 10 MG tablet Take 1 tablet (10 mg total) by mouth at bedtime as needed for sleep. 08/30/21 10/21/21  Crecencio Mc, MD    Family History Family History  Problem Relation Age of Onset   Heart disease Mother    Stroke Mother    Heart disease Father    Dementia Father    Heart disease Maternal Grandmother    Heart disease Maternal Grandfather    Heart disease Paternal Grandmother    Heart disease Paternal Grandfather    Breast cancer Maternal Aunt     Social History Social History   Tobacco Use   Smoking status: Never   Smokeless tobacco: Never  Vaping Use   Vaping Use: Never used  Substance Use Topics   Alcohol use: Yes    Alcohol/week: 1.0 standard drink of alcohol    Types: 1 Cans of beer per week    Comment: occassionally   Drug use: No     Allergies   Atorvastatin and Thimerosal   Review of Systems Review of Systems  Constitutional: Negative.   HENT: Negative.    Eyes:  Positive for pain, discharge, itching and visual disturbance. Negative for photophobia and redness.   Respiratory: Negative.    Cardiovascular: Negative.      Physical Exam Triage Vital Signs ED Triage Vitals  Enc Vitals Group     BP 10/27/21 0818 (!) 164/96     Pulse Rate 10/27/21 0818 (!) 111     Resp 10/27/21 0818 16     Temp 10/27/21 0818 98.9 F (37.2 C)     Temp Source 10/27/21 0818 Oral     SpO2 10/27/21 0818 94 %     Weight --      Height --      Head Circumference --      Peak Flow --      Pain Score 10/27/21 0820 3     Pain Loc --      Pain Edu? --      Excl. in [place]? --    No data found.  Updated Vital Signs BP (!) 164/96 (BP Location: Left Arm)   Pulse (!) 111   Temp 98.9 F (37.2 C) (Oral)   Resp 16   SpO2 94%   Visual Acuity Right Eye Distance:   Left Eye Distance:   Bilateral Distance:    Right Eye Near:   Left Eye Near:    Bilateral Near:     Physical Exam Constitutional:      Appearance: Normal appearance.  Eyes:     Extraocular Movements: Extraocular movements intact.     Comments: Mild erythema present to the bilateral conjunctiva, scant drainage noted around the lower lashes and periorbital with mild periorbital swelling without erythema or tenderness, vision is grossly intact, extraocular movements intact  Pulmonary:     Effort: Pulmonary effort is normal.  Neurological:     Mental Status: She is alert and oriented to person, place, and time. Mental status is at baseline.  Psychiatric:        Mood and Affect: Mood normal.        Behavior: Behavior normal.      UC Treatments / Results  Labs (all labs ordered are listed, but only abnormal results are displayed) Labs Reviewed -  No data to display  EKG   Radiology No results found.  Procedures Procedures (including critical care time)  Medications Ordered in UC Medications - No data to display  Initial Impression / Assessment and Plan / UC Course  I have reviewed the triage vital signs and the nursing notes.  Pertinent labs & imaging results that were available during  my care of the patient were reviewed by me and considered in my medical decision making (see chart for details).  Bacterial conjunctivitis of both eyes  Presentation is consistent with conjunctivitis however if symptoms have not improved through use of antibiotic will change optic drops as well as cover for cellulitis, moxifloxacin and Keflex course prescribed, discussed administration, may use oral antihistamines for management of pruritus, recommended against eye touching or rubbing to prevent further irritation and contamination, recommended against use of contacts or make-up while symptoms are present, given walking referral to ophthalmology if symptoms have not improved by 72 hours posttreatment Final Clinical Impressions(s) / UC Diagnoses   Final diagnoses:  Bacterial conjunctivitis of both eyes     Discharge Instructions      Today you being treated for bacterial conjunctivitis.   Place 1 drop of moxifloxacin in the eye every 8 hours for the next 7 days  Begin Keflex every morning and every evening for the next 5 days to cover for bacteria around the skin  May use cool compress for comfort and to remove discharge if present. Pat the eye, do not wipe.  If wearing contacts, dispose of current pair. Wear glasses until symptoms have resolved.   Do not rub eyes, this may cause more irritation.  May use benadryl as needed to help if itching present.  Please avoid use of eye makeup until symptoms clear.  If you not begin to see improvement in symptoms by Monday please schedule an appointment with ophthalmology, information is on your paperwork    ED Prescriptions     Medication Sig Dispense Auth. Provider   moxifloxacin (VIGAMOX) 0.5 % ophthalmic solution Place 1 drop into both eyes 3 (three) times daily. 3 mL Mckinzi Eriksen R, NP   cephALEXin (KEFLEX) 500 MG capsule Take 1 capsule (500 mg total) by mouth 2 (two) times daily for 5 days. 10 capsule Hans Eden, NP       PDMP not reviewed this encounter.   Hans Eden, [place] 10/27/21 [phone]

## 2021-10-27 NOTE — ED Triage Notes (Signed)
Patient c/o bilateral eye drainage and irritation x 9 days.   Patient denies eye trauma.   Patient endorses blurriness. Patient endorses itchiness.   Patient endorses clear drainage from eyes.   Patient endorses eye crustiness in the morning.  Patient has used polytrim eye drops prescribed from this clinic since "last Friday" with no relief of symptoms.

## 2021-11-01 ENCOUNTER — Telehealth: Payer: Self-pay

## 2021-11-01 NOTE — Telephone Encounter (Signed)
LMTCB to let patient know that 5 boxes of Ozempic were in from patient assistance. Will place labeled in fridge.

## 2021-11-01 NOTE — Telephone Encounter (Signed)
Pt called and I read message to her and she stated [facility]

## 2021-11-03 ENCOUNTER — Encounter: Payer: Self-pay | Admitting: Family Medicine

## 2021-11-03 ENCOUNTER — Ambulatory Visit (INDEPENDENT_AMBULATORY_CARE_PROVIDER_SITE_OTHER): Payer: Medicare HMO | Admitting: Family Medicine

## 2021-11-03 VITALS — Ht 60.0 in | Wt 149.0 lb

## 2021-11-03 DIAGNOSIS — Z0000 Encounter for general adult medical examination without abnormal findings: Secondary | ICD-10-CM | POA: Diagnosis not present

## 2021-11-03 NOTE — Progress Notes (Signed)
PATIENT CHECK-IN and HEALTH RISK ASSESSMENT QUESTIONNAIRE:  -completed by phone/video for upcoming Medicare Preventive Visit  Pre-Visit Check-in: 1)Vitals (height, wt, BP, etc) - record in vitals section for visit on day of visit 2)Review and Update Medications, Allergies PMH, Surgeries, Social history in Epic 3)Hospitalizations in the last year with date/reason? No  4)Review and Update Care Team (patient's specialists) in Epic 5) Complete PHQ9 in Epic: Done 6) Complete Fall Screening in Epic: Done 7)Review all Health Maintenance Due and order under PCP if not done.  8)Medicare Wellness Questionnaire: Answer theses question about your habits: Do you drink alcohol? Occasional How many drinks do you have a day?1 drink weekly Have you ever smoked?NO Have you stopped smoking and date if applicable? N/a How many packs a day do you smoke? N/a Do you use smokeless tobacco?no Do you use an illicit drugs?no Do you exercises? Yes IF so, what type and how many days/minutes per week?water aerobics / 1 hr 4 times weekly Are you sexually active? No Number of partners?yes separated currently What did you eat for breakfast today (or yesterday)?scrambled eggs, tomato and a piece of sausage Typical breakfast yes What did you eat for lunch today (or yesterday)?  Boiled eggs, tomato and sausage Typical lunch yes What did you eat for diner today (or yesterday)?Tomato, corn chicken salad, piece of cake Typical dinner yes Typical snacks: cookie, yogurt What beverages do you drink besides water:diet pepsi, orange juice  Answer theses question about you: Can you perform most household chores?Yes Do you find it hard to follow a conversation in a noisy room?sometime gets destructed easily, but try focusing  Do you find it hard to understand a speaker at church or in a meeting? No Do you often ask people to speak up or repeat themselves? No Do you experience ringing in your ears? No Do you have difficulty  understanding a soft or whispered voice? No Do you feel that you have a problem with memory? No taking Aricept that seems to help Do you often misplace items? Yes but tries to retrace steps Do you balance your checkbook and or bank acounts? yes Do you feel safe at home? Yes Last dentist visit? 07/2021 Do you need assistance with any of the following:  Driving? no  Feeding yourself? no  Getting from bed to chair? no  Getting to the toilet? no  Bathing or showering? no  Dressing yourself? no  Managing money? Sometime, overspends sometimes  Climbing a flight of stairs? Sometime minimal SOB  Preparing meals? No  Do you have Advanced Directives in place (Living Will, Healthcare Power or Attorney)? Not yet    Last eye Exam and location? [place] / 4 months ago   Do you currently use prescribed or non-prescribed narcotic or opioid pain medications? No  Do you have a history or close family history of breast, ovarian, tubal or peritoneal cancer or a family member with BRCA (breast cancer susceptibility 1 and 2) gene mutations? No  Nurse/Assistant Credentials/time stamp: Rosealee Albee CMA   ----------------------------------------------------------------------------------------------------------------------------------------------------------------------------------------------------------------------   MEDICARE ANNUAL PREVENTIVE VISIT WITH PROVIDER: (Welcome to Medicare, initial annual wellness or annual wellness exam)  Virtual Visit via Video Note  I connected with Chloris  on 11/03/21 by telephone and verified that I am speaking with the correct person using two identifiers.  Location patient: home Location provider:work or home office Persons participating in the virtual visit: patient, provider  Concerns and/or follow up today: none   See HM section in Epic for other  details of completed HM.    ROS: negative for report of fevers, unintentional weight loss, vision  changes, vision loss, hearing loss or change, chest pain, sob, hemoptysis, melena, hematochezia, hematuria, genital discharge or lesions, falls, bleeding or bruising, loc, thoughts of suicide or self harm, memory loss  Patient-completed extensive health risk assessment - reviewed and discussed with the patient: See Health Risk Assessment completed with patient prior to the visit either above or in recent phone note. This was reviewed in detailed with the patient today and appropriate recommendations, orders and referrals were placed as needed per Summary below and patient instructions.   Review of Medical History: -PMH, PSH, Family History and current specialty and care providers reviewed and updated and listed below   Patient Care Team: Crecencio Mc, MD as PCP - General (Internal Medicine)   Past Medical History:  Diagnosis Date   Arthritis    back, knees, fingers   Bipolar disorder ([place])    Bunion, left    Depression    Diabetes mellitus    diet controlled.    Elevated liver enzymes 05/31/2021   Hypertension    Lab test positive for detection of COVID-19 virus 03/02/2021   Lower back pain    S/P fall   Squamous cell skin cancer, face    [facility]   Treadmill stress test negative for angina pectoris June 2013    Past Surgical History:  Procedure Laterality Date   BLADDER AUGMENTATION     BLADDER SURGERY     CARDIAC CATHETERIZATION  03/05/12   no stents   CATARACT EXTRACTION W/PHACO Right 07/19/2015   Procedure: CATARACT EXTRACTION PHACO AND INTRAOCULAR LENS PLACEMENT ([place]) right eye;  Surgeon: Ronnell Freshwater, MD;  Location: [facility];  Service: Ophthalmology;  Laterality: Right;  BORDERLINE DIABETIC - oral meds   CATARACT EXTRACTION W/PHACO Left 05/28/2017   Procedure: CATARACT EXTRACTION PHACO AND INTRAOCULAR LENS PLACEMENT ([place]) LEFT DIABETIC;  Surgeon: Leandrew Koyanagi, MD;  Location: [facility];  Service: Ophthalmology;  Laterality: Left;   Diabetic - oal meds   JOINT REPLACEMENT  2012   bilateral hip   KNEE ARTHROSCOPY     TOTAL HIP ARTHROPLASTY  2012   TOTAL KNEE ARTHROPLASTY Right 06/04/2017   Procedure: TOTAL KNEE ARTHROPLASTY;  Surgeon: Lovell Sheehan, MD;  Location: [facility];  Service: Orthopedics;  Laterality: Right;   TUBAL LIGATION     VAGINAL DELIVERY     2    Social History   Socioeconomic History   Marital status: Married    Spouse name: Not on file   Number of children: Not on file   Years of education: Not on file   Highest education level: Not on file  Occupational History   Not on file  Tobacco Use   Smoking status: Never   Smokeless tobacco: Never  Vaping Use   Vaping Use: Never used  Substance and Sexual Activity   Alcohol use: Yes    Alcohol/week: 1.0 standard drink of alcohol    Types: 1 Cans of beer per week    Comment: occassionally   Drug use: No   Sexual activity: Not on file  Other Topics Concern   Not on file  Social History Narrative   Lives in [place] with husband.         Social Determinants of Health   Financial Resource Strain: High Risk (03/24/2021)   Overall Financial Resource Strain (CARDIA)    Difficulty of Paying Living Expenses: Hard  Food Insecurity: No Food Insecurity (12/14/2020)   Hunger Vital Sign    Worried About Running Out of Food in the Last Year: Never true    Ran Out of Food in the Last Year: Never true  Transportation Needs: No Transportation Needs (12/14/2020)   PRAPARE - Hydrologist (Medical): No    Lack of Transportation (Non-Medical): No  Physical Activity: Sufficiently Active (12/14/2020)   Exercise Vital Sign    Days of Exercise per Week: 5 days    Minutes of Exercise per Session: 60 min  Stress: No Stress Concern Present (12/14/2020)   [place]    Feeling of Stress : Not at all  Social Connections: Unknown (12/14/2020)   Social Connection and  Isolation Panel [NHANES]    Frequency of Communication with Friends and Family: More than three times a week    Frequency of Social Gatherings with Friends and Family: More than three times a week    Attends Religious Services: Not on file    Active Member of Clubs or Organizations: Not on file    Attends Archivist Meetings: Not on file    Marital Status: Not on file  Intimate Partner Violence: Not At Risk (12/14/2020)   Humiliation, Afraid, Rape, and Kick questionnaire    Fear of Current or Ex-Partner: No    Emotionally Abused: No    Physically Abused: No    Sexually Abused: No    Family History  Problem Relation Age of Onset   Heart disease Mother    Stroke Mother    Heart disease Father    Dementia Father    Heart disease Maternal Grandmother    Heart disease Maternal Grandfather    Heart disease Paternal Grandmother    Heart disease Paternal Grandfather    Breast cancer Maternal Aunt     Current Outpatient Medications on File Prior to Visit  Medication Sig Dispense Refill   acetaminophen (TYLENOL) 325 MG tablet Take 650 mg by mouth every 6 (six) hours as needed.     aspirin EC 81 MG tablet Take 81 mg by mouth daily. Swallow whole.     Biotin 5 MG CAPS Take 2 capsules by mouth 2 (two) times daily.      blood glucose meter kit and supplies Dispense Contour next bayer meter. E11.9 To check blood glucose  Once a day. 1 each 0   busPIRone (BUSPAR) 10 MG tablet Take 1 tablet (10 mg total) by mouth 3 (three) times daily. 90 tablet 2   Cholecalciferol (VITAMIN D3) 1000 units CAPS Take 1,000 Units by mouth daily.      cyanocobalamin (,VITAMIN B-12,) 1000 MCG/ML injection INJECT 1 ML INTO THE MUSCLE EVERY 30 DAYS 3 mL 1   cyclobenzaprine (FLEXERIL) 5 MG tablet TAKE ONE TABLET BY MOUTH THREE TIMES A DAY AS NEEDED FOR MUSCLE SPASMS 30 tablet 0   diphenhydramine-acetaminophen (TYLENOL PM) 25-500 MG TABS tablet Take 1 tablet by mouth at bedtime as needed.     donepezil  (ARICEPT) 5 MG tablet Take 5 mg by mouth daily.     DULoxetine (CYMBALTA) 20 MG capsule Take 1 capsule (20 mg total) by mouth daily. TAKE WITH 40 MG CAPSULE 30 capsule 3   DULoxetine HCl 40 MG CPEP TAKE ONE CAPSULE BY MOUTH DAILY 30 capsule 2   glucose blood (FREESTYLE LITE) test strip Use once daily  as instructed to test blood sugar E 11.9 100  each 3   hydrOXYzine (VISTARIL) 25 MG capsule Take 1 capsule (25 mg total) by mouth 3 (three) times daily. 90 capsule 0   lamoTRIgine (LAMICTAL) 200 MG tablet TAKE ONE TABLET BY MOUTH TWICE A DAY 180 tablet 1   Lancets (FREESTYLE) lancets Use once daily to check blood sugars  E11.9 100 each 3   losartan (COZAAR) 50 MG tablet Take 1 tablet (50 mg total) by mouth daily. 90 tablet 3   moxifloxacin (VIGAMOX) 0.5 % ophthalmic solution Place 1 drop into both eyes 3 (three) times daily. 3 mL 0   naproxen sodium (ALEVE) 220 MG tablet Take 220 mg by mouth daily as needed.     nitroGLYCERIN (NITROSTAT) 0.4 MG SL tablet Place 1 tablet (0.4 mg total) under the tongue every 5 (five) minutes as needed for chest pain. 50 tablet 3   omeprazole (PRILOSEC) 40 MG capsule TAKE ONE CAPSULE BY MOUTH DAILY 90 capsule 3   Semaglutide,0.25 or 0.5MG/DOS, (OZEMPIC, 0.25 OR 0.5 MG/DOSE,) 2 MG/1.5ML SOPN Inject 0.5 mg into the skin once a week. Inject 0.25 mg weekly for 4 weeks then increase to 0.5 mg weekly 1.5 mL 0   Syringe, Disposable, 1 ML MISC For use with B12 injections 25 each 3   zolpidem (AMBIEN) 10 MG tablet Take 1 tablet (10 mg total) by mouth at bedtime as needed for sleep. 30 tablet 5   No current facility-administered medications on file prior to visit.    Allergies  Allergen Reactions   Atorvastatin     Liver enzyme elevation    Thimerosal Itching       Physical Exam There were no vitals filed for this visit. Estimated body mass index is 29.1 kg/m as calculated from the following:   Height as of this encounter: 5' (1.524 m).   Weight as of this encounter:  149 lb (67.6 kg). BP 122/84 per patient. Weight 149 lbs per patient.  EKG (optional): deferred due to virtual visit  GENERAL: sounds alert and in no acute distress, pleasant  HEENT: vision exam deferred given telephone exam   PSYCH/NEURO: pleasant and cooperative, cognitive function grossly intact on this exam  [place] Office Visit from 11/03/2021 in [facility] [place]  PHQ-9 Total Score 5           11/03/2021   12:36 PM 09/27/2021   11:15 AM 05/30/2021    3:13 PM 04/06/2021   11:10 AM 03/21/2021    3:50 PM  Depression screen PHQ 2/9  Decreased Interest 3 1 0 0 0  Down, Depressed, Hopeless 1 1 0 0 0  PHQ - 2 Score 4 2 0 0 0  Altered sleeping 0 2 0 0   Tired, decreased energy 1 1 0 0   Change in appetite 0 1 0 0   Feeling bad or failure about yourself  0 0 0 0   Trouble concentrating 0 0 0 0   Moving slowly or fidgety/restless 0 0 0 0   Suicidal thoughts 0 0 0 0   PHQ-9 Score 5 6 0 0   Difficult doing work/chores Not difficult at all Somewhat difficult Not difficult at all Not difficult at all       09/13/2021   11:50 AM 09/27/2021   11:10 AM 10/21/2021   10:38 AM 10/27/2021    8:21 AM 11/03/2021   12:35 PM  Fall Risk  Falls in the past year? 0 0   0  Was there an injury with Fall?  0  Fall Risk Category Calculator     0  Fall Risk Category     Low  Patient Fall Risk Level _0   Patient at Risk for Falls Due to No Fall Risks No Fall Risks   No Fall Risks  Fall risk Follow up Falls evaluation completed Falls evaluation completed   Falls evaluation completed      SUMMARY AND PLAN:  Medicare annual wellness visit, subsequent   Appropriate health maintenance/preventive care measures were recommended/discussed and the patient was referred if needed and if the patient agreeable:   Vaccines - due for the covid booster and pneumonia   Eye exam not updated in epic, asked staff to obtain  copy of report and abstract  Education and counseling on the following was provided based on the above review of health and a plan/checklist for the patient, along with additional information discussed, was provided for the patient in the patient instructions :  -Advised on importance of and resources for completing advanced directives -Advised and counseled on  healthy lifestyle - including the importance of a health diet, regular physical activity, social connections and stress management. -Advised and counseled on a whole foods based healthy diet and regular exercise: discussed a heart healthy whole foods based diet at length. A summary of a healthy diet was provided in the Patient Instructions. Recommended regular exercise and discussed options within the community.  -Advised yearly dental visits at minimum and regular eye exams   Follow up: see patient instructions     Patient Instructions  CHECKLIST FOR A HEALTHY LIFE:  -Eat a healthy whole foods based diet, get regular physical activity, manage stress and engage in social connections - see below for specific suggestions and more information.  -Vaccines due:  -Covid and flu boosters in the fall  -pneumonia vaccine  -See a dentist at least yearly  -Get your eyes checked per your eye specialist's recommendations  -Other issues addressed today: -see advanced directive and healthy diet information provided below  -Follow up: - in October with Dr. Derrel Nip as labs will be due then - please schedule an appointment.  -----------------------------------------------------------------------------------------------------------------------------------------------------------------------------------------------------------------------------------------------------------   FOOD - THE FUEL FOR A HAPPY HEALTHY LIFE: Food Prescription for you: -eat real food: lots of colorful vegetables (half the plate) and small portions of fresh fruits,  fish, nuts, seeds, healthy oils (such as olive oil, avocado oil or organic grass fed unsalted butter), small portions of lean unprocessed meat (though avoidance of red meat is best) and small portions of whole grains -drink water -try to avoid fast food and pre-packaged foods  -try to avoid foods that contain any ingredients with names you do not recognize  -try to avoid sugar/sweets (except for the natural sugar that occurs in fresh fruit) -try to avoid sweet drinks -try to avoid white rice, white bread, pasta, white or yellow potatoes  MOVE - the key to keeping your body moving and working best: Exercise Prescription for you: -gradually increase intentional physical activity -move and stretch your body, legs, feet and arms when sitting for long periods -try to get at least 20 minutes of sustained activity or two 10 minute episodes of sustained activity every day at minimum  [place] - so important for health and well being -try meditating, or just sitting quietly with deep breathing while intentionally relaxing all parts of your body for 5 minutes daily  SOCIAL CONNECTIONS: -options in [place]  if you wish to engage in more social activities: -Check out the [place] 50+ section on the [place] [employer] (hiking clubs, book clubs, cards and games, chess, exercise classes, aquatic classes and much more) - see the website for details: https://www.[place].gov/departments/parks-recreation/active-adults50 -[place] (a variety of indoor and outdoor inperson activities for adults). [phone]. [address]. -Virtual Online Classes (a variety of topics): see seniorplanet.org or call [phone] -consider volunteering at a school, hospice center, church, senior center or elsewhere    ADVANCED HEALTHCARE DIRECTIVES:  Everyone should have advanced health care directives in place. This is so that you get the care you want, should  you ever be in a situation where you are unable to make your own medical decisions.   From the [place] Advanced Directive Website: "[place] are legal documents in which you give written instructions about your health care if, in the future, you cannot speak for yourself.   A health care power of attorney allows you to name a person you trust to make your health care decisions if you cannot make them yourself. A declaration of a desire for a natural death (or living will) is document, which states that you desire not to have your life prolonged by extraordinary measures if you have a terminal or incurable illness or if you are in a vegetative state. An advance instruction for mental health treatment makes a declaration of instructions, information and preferences regarding your mental health treatment. It also states that you are aware that the advance instruction authorizes a mental health treatment provider to act according to your wishes. It may also outline your consent or refusal of mental health treatment. A declaration of an anatomical gift allows anyone over the age of 13 to make a gift by will, organ donor card or other document."   Please see the following website or an elder law attorney for forms, FAQs and for completion of advanced directives: [place] Secretary of [place] ([email or web address])  Or copy and paste the following to your web browser: [email or web address]          Katherine Kern, DO

## 2021-11-03 NOTE — Patient Instructions (Addendum)
CHECKLIST FOR A HEALTHY LIFE:  -Eat a healthy whole foods based diet, get regular physical activity, manage stress and engage in social connections - see below for specific suggestions and more information.  -Vaccines due:  -Covid and flu boosters in the fall  -pneumonia vaccine  -See a dentist at least yearly  -Get your eyes checked per your eye specialist's recommendations  -Other issues addressed today: -see advanced directive and healthy diet information provided below  -Follow up: - in October with Dr. Derrel Nip as labs will be due then - please schedule an appointment.     FOOD - THE FUEL FOR A HAPPY HEALTHY LIFE: Food Prescription for you: -eat real food: lots of colorful vegetables (half the plate) and small portions of fresh fruits, fish, nuts, seeds, healthy oils (such as olive oil, avocado oil or organic grass fed unsalted butter), small portions of lean unprocessed meat (though avoidance of red meat is best) and small portions of whole grains -drink water -try to avoid fast food and pre-packaged foods  -try to avoid foods that contain any ingredients with names you do not recognize  -try to avoid sugar/sweets (except for the natural sugar that occurs in fresh fruit) -try to avoid sweet drinks -try to avoid white rice, white bread, pasta, white or yellow potatoes  MOVE - the key to keeping your body moving and working best: Exercise Prescription for you: -gradually increase intentional physical activity -move and stretch your body, legs, feet and arms when sitting for long periods -try to get at least 20 minutes of sustained activity or two 10 minute episodes of sustained activity every day at minimum  STRESS MANAGEMENT - so  important for health and well being -try meditating, or just sitting quietly with deep breathing while intentionally relaxing all parts of your body for 5 minutes daily  SOCIAL CONNECTIONS: -options in [place] if you wish to engage in more social activities: -Check out the [place] 50+ section on the [place] [employer] (hiking clubs, book clubs, cards and games, chess, exercise classes, aquatic classes and much more) - see the website for details: https://www.[place].gov/departments/parks-recreation/active-adults50 -[place] (a variety of indoor and outdoor inperson activities for adults). [phone]. [address]. -Virtual Online Classes (a variety of topics): see seniorplanet.org or call [phone] -consider volunteering at a school, hospice center, church, senior center or elsewhere    ADVANCED HEALTHCARE DIRECTIVES:  Everyone should have advanced health care directives in place. This is so that you get the care you want, should you ever be in a situation where you are unable to make your own medical decisions.   From the  Advanced Directive Website: "[place] are legal documents in which you give written instructions about your health care if, in the future, you cannot speak for yourself.   A health care power of attorney allows you to name a person you trust to make your health care decisions if you cannot make them yourself. A declaration of a desire for a natural death (or living will) is document, which states that you desire not to have your life prolonged by extraordinary measures if you have a terminal or incurable illness or if you are in a vegetative state. An advance instruction for mental health treatment makes a declaration of instructions, information and preferences regarding your mental health treatment. It also states that you are aware that the advance instruction authorizes a mental  health treatment provider to act according to your  wishes. It may also outline your consent or refusal of mental health treatment. A declaration of an anatomical gift allows anyone over the age of 21 to make a gift by will, organ donor card or other document."   Please see the following website or an elder law attorney for forms, FAQs and for completion of advanced directives: [place] Secretary of [place] ([email or web address])  Or copy and paste the following to your web browser: [email or web address]

## 2021-11-11 ENCOUNTER — Other Ambulatory Visit: Payer: Self-pay | Admitting: Internal Medicine

## 2021-11-17 DIAGNOSIS — M3501 Sicca syndrome with keratoconjunctivitis: Secondary | ICD-10-CM | POA: Diagnosis not present

## 2021-12-01 NOTE — Telephone Encounter (Signed)
LMTCB. Need to let pt know that her pt assistance medication, Ozempic, is here for pick up.

## 2021-12-02 NOTE — Telephone Encounter (Signed)
Pt called back and I read the message to her and she will come by to pick it up

## 2021-12-02 NOTE — Telephone Encounter (Signed)
noted 

## 2021-12-12 ENCOUNTER — Other Ambulatory Visit: Payer: Self-pay | Admitting: Internal Medicine

## 2021-12-13 ENCOUNTER — Encounter: Payer: Self-pay | Admitting: Family

## 2021-12-13 ENCOUNTER — Encounter: Payer: Medicare HMO | Admitting: Family

## 2021-12-13 NOTE — Progress Notes (Signed)
Subjective:    Patient ID: Katherine Jimenez, female    DOB: [birth year], 71 y.o.   MRN: [id number]  CC: Katherine Jimenez is a 71 y.o. female who presents today for an acute visit.    HPI: HPI  HISTORY:  Past Medical History:  Diagnosis Date  . Arthritis    back, knees, fingers  . Bipolar disorder ([place])   . Bunion, left   . Depression   . Diabetes mellitus    diet controlled.   . Elevated liver enzymes 05/31/2021  . Hypertension   . Lab test positive for detection of COVID-19 virus 03/02/2021  . Lower back pain    S/P fall  . Squamous cell skin cancer, face    [facility]  . Treadmill stress test negative for angina pectoris June 2013   Past Surgical History:  Procedure Laterality Date  . BLADDER AUGMENTATION    . BLADDER SURGERY    . CARDIAC CATHETERIZATION  03/05/12   no stents  . CATARACT EXTRACTION W/PHACO Right 07/19/2015   Procedure: CATARACT EXTRACTION PHACO AND INTRAOCULAR LENS PLACEMENT ([place]) right eye;  Surgeon: Ronnell Freshwater, MD;  Location: [facility];  Service: Ophthalmology;  Laterality: Right;  BORDERLINE DIABETIC - oral meds  . CATARACT EXTRACTION W/PHACO Left 05/28/2017   Procedure: CATARACT EXTRACTION PHACO AND INTRAOCULAR LENS PLACEMENT ([place]) LEFT DIABETIC;  Surgeon: Leandrew Koyanagi, MD;  Location: [facility];  Service: Ophthalmology;  Laterality: Left;  Diabetic - oal meds  . JOINT REPLACEMENT  2012   bilateral hip  . KNEE ARTHROSCOPY    . TOTAL HIP ARTHROPLASTY  2012  . TOTAL KNEE ARTHROPLASTY Right 06/04/2017   Procedure: TOTAL KNEE ARTHROPLASTY;  Surgeon: Lovell Sheehan, MD;  Location: [facility];  Service: Orthopedics;  Laterality: Right;  . TUBAL LIGATION    . VAGINAL DELIVERY     2   Family History  Problem Relation Age of Onset  . Heart disease Mother   . Stroke Mother   . Heart disease Father   . Dementia Father   . Heart disease Maternal Grandmother   . Heart disease Maternal Grandfather   . Heart disease  Paternal Grandmother   . Heart disease Paternal Grandfather   . Breast cancer Maternal Aunt     Allergies: Atorvastatin and Thimerosal Current Outpatient Medications on File Prior to Visit  Medication Sig Dispense Refill  . acetaminophen (TYLENOL) 325 MG tablet Take 650 mg by mouth every 6 (six) hours as needed.    Marland Kitchen aspirin EC 81 MG tablet Take 81 mg by mouth daily. Swallow whole.    . Biotin 5 MG CAPS Take 2 capsules by mouth 2 (two) times daily.     . blood glucose meter kit and supplies Dispense Contour next bayer meter. E11.9 To check blood glucose  Once a day. 1 each 0  . busPIRone (BUSPAR) 10 MG tablet Take 1 tablet (10 mg total) by mouth 3 (three) times daily. 90 tablet 2  . Cholecalciferol (VITAMIN D3) 1000 units CAPS Take 1,000 Units by mouth daily.     . cyanocobalamin (,VITAMIN B-12,) 1000 MCG/ML injection INJECT 1 ML INTO THE MUSCLE EVERY 30 DAYS 3 mL 1  . cyclobenzaprine (FLEXERIL) 5 MG tablet TAKE ONE TABLET BY MOUTH THREE TIMES A DAY AS NEEDED FOR MUSCLE SPASMS 30 tablet 0  . diphenhydramine-acetaminophen (TYLENOL PM) 25-500 MG TABS tablet Take 1 tablet by mouth at bedtime as needed.    . donepezil (ARICEPT) 5 MG  tablet Take 5 mg by mouth daily.    . DULoxetine (CYMBALTA) 20 MG capsule Take 1 capsule (20 mg total) by mouth daily. TAKE WITH 40 MG CAPSULE 30 capsule 3  . DULoxetine HCl 40 MG CPEP TAKE ONE CAPSULE BY MOUTH DAILY 30 capsule 2  . glucose blood (FREESTYLE LITE) test strip Use once daily  as instructed to test blood sugar E 11.9 100 each 3  . hydrOXYzine (VISTARIL) 25 MG capsule Take 1 capsule (25 mg total) by mouth 3 (three) times daily. 90 capsule 0  . lamoTRIgine (LAMICTAL) 200 MG tablet TAKE ONE TABLET BY MOUTH TWICE A DAY 180 tablet 1  . Lancets (FREESTYLE) lancets Use once daily to check blood sugars  E11.9 100 each 3  . losartan (COZAAR) 25 MG tablet TAKE TWO TABLETS BY MOUTH DAILY 180 tablet 0  . losartan (COZAAR) 50 MG tablet Take 1 tablet (50 mg total) by  mouth daily. 90 tablet 3  . moxifloxacin (VIGAMOX) 0.5 % ophthalmic solution Place 1 drop into both eyes 3 (three) times daily. 3 mL 0  . naproxen sodium (ALEVE) 220 MG tablet Take 220 mg by mouth daily as needed.    . nitroGLYCERIN (NITROSTAT) 0.4 MG SL tablet Place 1 tablet (0.4 mg total) under the tongue every 5 (five) minutes as needed for chest pain. 50 tablet 3  . omeprazole (PRILOSEC) 40 MG capsule TAKE ONE CAPSULE BY MOUTH DAILY 90 capsule 3  . Semaglutide,0.25 or 0.5MG/DOS, (OZEMPIC, 0.25 OR 0.5 MG/DOSE,) 2 MG/1.5ML SOPN Inject 0.5 mg into the skin once a week. Inject 0.25 mg weekly for 4 weeks then increase to 0.5 mg weekly 1.5 mL 0  . Syringe, Disposable, 1 ML MISC For use with B12 injections 25 each 3  . zolpidem (AMBIEN) 10 MG tablet Take 1 tablet (10 mg total) by mouth at bedtime as needed for sleep. 30 tablet 5   No current facility-administered medications on file prior to visit.    Social History   Tobacco Use  . Smoking status: Never  . Smokeless tobacco: Never  Vaping Use  . Vaping Use: Never used  Substance Use Topics  . Alcohol use: Yes    Alcohol/week: 1.0 standard drink of alcohol    Types: 1 Cans of beer per week    Comment: occassionally  . Drug use: No    Review of Systems    Objective:    There were no vitals taken for this visit.   Physical Exam     Assessment & Plan:      I am having Katherine Jimenez maintain her glucose blood, freestyle, Syringe (Disposable), blood glucose meter kit and supplies, Vitamin D3, Biotin, diphenhydramine-acetaminophen, omeprazole, aspirin EC, Ozempic (0.25 or 0.5 MG/DOSE), nitroGLYCERIN, cyanocobalamin, losartan, donepezil, acetaminophen, hydrOXYzine, DULoxetine HCl, zolpidem, cyclobenzaprine, DULoxetine, busPIRone, naproxen sodium, moxifloxacin, losartan, and lamoTRIgine.   No orders of the defined types were placed in this encounter.   Return precautions given.   Risks, benefits, and alternatives of the  medications and treatment plan prescribed today were discussed, and patient expressed understanding.   Education regarding symptom management and diagnosis given to patient on AVS.  Continue to follow with Crecencio Mc, MD for routine health maintenance.   Deborra Medina and I agreed with plan.   Mable Paris, FNP  This encounter was created in error - please disregard.

## 2021-12-14 ENCOUNTER — Ambulatory Visit: Payer: Medicare HMO | Admitting: Dermatology

## 2021-12-14 ENCOUNTER — Encounter: Payer: Self-pay | Admitting: Dermatology

## 2021-12-14 DIAGNOSIS — D229 Melanocytic nevi, unspecified: Secondary | ICD-10-CM

## 2021-12-14 DIAGNOSIS — D225 Melanocytic nevi of trunk: Secondary | ICD-10-CM

## 2021-12-14 DIAGNOSIS — Z85828 Personal history of other malignant neoplasm of skin: Secondary | ICD-10-CM | POA: Diagnosis not present

## 2021-12-14 DIAGNOSIS — L814 Other melanin hyperpigmentation: Secondary | ICD-10-CM

## 2021-12-14 DIAGNOSIS — L570 Actinic keratosis: Secondary | ICD-10-CM

## 2021-12-14 DIAGNOSIS — L988 Other specified disorders of the skin and subcutaneous tissue: Secondary | ICD-10-CM

## 2021-12-14 MED ORDER — TRETINOIN 0.05 % EX CREA: TOPICAL_CREAM | CUTANEOUS | 3 refills | Status: DC

## 2021-12-14 NOTE — Progress Notes (Signed)
Follow-Up Visit   Subjective  Katherine Jimenez is a 71 y.o. female who presents for the following: Actinic Keratosis (6 month follow up. Hx of PDT tx on chest in March 2023. Hx of skin cancer on face Tx at [facility] dermatology. C/O new areas on posterior thighs. Rough, raised, patient scratches at areas) and Facial Elastosis (Would like to discuss treatment for wrinkles).  The patient has spots, moles and lesions to be evaluated, some may be new or changing and the patient has concerns that these could be cancer.   The following portions of the chart were reviewed this encounter and updated as appropriate:      Review of Systems: No other skin or systemic complaints except as noted in HPI or Assessment and Plan.   Objective  Well appearing patient in no apparent distress; mood and affect are within normal limits.  A focused examination was performed including face, chest, back, B/L posterior thighs. Relevant physical exam findings are noted in the Assessment and Plan.  Right Zygoma x1 Erythematous thin papules/macules with gritty scale.   Left Upper Back Flesh brown papule  face Rhytides and volume loss.    Assessment & Plan  AK (actinic keratosis) Right Zygoma x1  Actinic keratoses are precancerous spots that appear secondary to cumulative UV radiation exposure/sun exposure over time. They are chronic with expected duration over 1 year. A portion of actinic keratoses will progress to squamous cell carcinoma of the skin. It is not possible to reliably predict which spots will progress to skin cancer and so treatment is recommended to prevent development of skin cancer.  Recommend daily broad spectrum sunscreen SPF 30+ to sun-exposed areas, reapply every 2 hours as needed.  Recommend staying in the shade or wearing long sleeves, sun glasses (UVA+UVB protection) and wide brim hats (4-inch brim around the entire circumference of the hat). Call for new or changing  lesions.  Destruction of lesion - Right Zygoma x1  Destruction method: cryotherapy   Informed consent: discussed and consent obtained   Lesion destroyed using liquid nitrogen: Yes   Region frozen until ice ball extended beyond lesion: Yes   Outcome: patient tolerated procedure well with no complications   Post-procedure details: wound care instructions given   Additional details:  Prior to procedure, discussed risks of blister formation, small wound, skin dyspigmentation, or rare scar following cryotherapy. Recommend Vaseline ointment to treated areas while healing.   Nevus Left Upper Back  Benign-appearing.  Observation.  Call clinic for new or changing lesions.  Recommend daily use of broad spectrum spf 30+ sunscreen to sun-exposed areas.    Elastosis of skin face  Discussed  Botox, fillers, tretinoin, Vitamin C serum.   Start Tretinoin 0.05% cream pea-sized amount to face at bedtime, wash off in morning.   Topical retinoid medications like tretinoin/Retin-A, adapalene/Differin, tazarotene/Fabior, and Epiduo/Epiduo Forte can cause dryness and irritation when first started. Only apply a pea-sized amount to the entire affected area. Avoid applying it around the eyes, edges of mouth and creases at the nose. If you experience irritation, use a good moisturizer first and/or apply the medicine less often. If you are doing well with the medicine, you can increase how often you use it until you are applying every night. Be careful with sun protection while using this medication as it can make you sensitive to the sun. This medicine should not be used by pregnant women.   Recommend daily broad spectrum sunscreen SPF 30+ face qam   tretinoin (  RETIN-A) 0.05 % cream - face Apply pea-sized amount to face at bedtime, wash off in morning.   History of Squamous Cell Carcinoma of the Skin - No evidence of recurrence today - Recommend regular full body skin exams - Recommend daily broad  spectrum sunscreen SPF 30+ to sun-exposed areas, reapply every 2 hours as needed.  - Call if any new or changing lesions are noted between office visits  Lentigines - Scattered tan macules - Due to sun exposure - Benign-appering, observe - Recommend daily broad spectrum sunscreen SPF 30+ to sun-exposed areas, reapply every 2 hours as needed. - Call for any changes     Return in about 1 year (around 12/15/2022) for TBSE.  I, Emelia Salisbury, CMA, am acting as scribe for Brendolyn Patty, MD.  Documentation: I have reviewed the above documentation for accuracy and completeness, and I agree with the above.  Brendolyn Patty MD

## 2021-12-14 NOTE — Patient Instructions (Addendum)
Cryotherapy Aftercare  Wash gently with soap and water everyday.   Apply Vaseline and Band-Aid daily until healed.    Start Tretinoin 0.05% cream pea-sized amount to face at bedtime, wash off in morning.   Topical retinoid medications like tretinoin/Retin-A, adapalene/Differin, tazarotene/Fabior, and Epiduo/Epiduo Forte can cause dryness and irritation when first started. Only apply a pea-sized amount to the entire affected area. Avoid applying it around the eyes, edges of mouth and creases at the nose. If you experience irritation, use a good moisturizer first and/or apply the medicine less often. If you are doing well with the medicine, you can increase how often you use it until you are applying every night. Be careful with sun protection while using this medication as it can make you sensitive to the sun. This medicine should not be used by pregnant women.      Seborrheic Keratosis  What causes seborrheic keratoses? Seborrheic keratoses are harmless, common skin growths that first appear during adult life.  As time goes by, more growths appear.  Some people may develop a large number of them.  Seborrheic keratoses appear on both covered and uncovered body parts.  They are not caused by sunlight.  The tendency to develop seborrheic keratoses can be inherited.  They vary in color from skin-colored to gray, brown, or even black.  They can be either smooth or have a rough, warty surface.   Seborrheic keratoses are superficial and look as if they were stuck on the skin.  Under the microscope this type of keratosis looks like layers upon layers of skin.  That is why at times the top layer may seem to fall off, but the rest of the growth remains and re-grows.    Treatment Seborrheic keratoses do not need to be treated, but can easily be removed in the office.  Seborrheic keratoses often cause symptoms when they rub on clothing or jewelry.  Lesions can be in the way of shaving.  If they become  inflamed, they can cause itching, soreness, or burning.  Removal of a seborrheic keratosis can be accomplished by freezing, burning, or surgery. If any spot bleeds, scabs, or grows rapidly, please return to have it checked, as these can be an indication of a skin cancer.   Recommend starting moisturizer with exfoliant (Urea, Salicylic acid, or Lactic acid) one to two times daily to help smooth rough and bumpy skin.  OTC options include Cetaphil Rough and Bumpy lotion (Urea), Eucerin Roughness Relief lotion or spot treatment cream (Urea), CeraVe SA lotion/cream for Rough and Bumpy skin (Sal Acid), Gold Bond Rough and Bumpy cream (Sal Acid), and AmLactin 12% lotion/cream (Lactic Acid).  If applying in morning, also apply sunscreen to sun-exposed areas, since these exfoliating moisturizers can increase sensitivity to sun.   Due to recent changes in healthcare laws, you may see results of your pathology and/or laboratory studies on MyChart before the doctors have had a chance to review them. We understand that in some cases there may be results that are confusing or concerning to you. Please understand that not all results are received at the same time and often the doctors may need to interpret multiple results in order to provide you with the best plan of care or course of treatment. Therefore, we ask that you please give [place] 2 business days to thoroughly review all your results before contacting the office for clarification. Should we see a critical lab result, you will be contacted sooner.   If You Need  Anything After Your Visit  If you have any questions or concerns for your doctor, please call our main line at [phone] and press option 4 to reach your doctor's medical assistant. If no one answers, please leave a voicemail as directed and we will return your call as soon as possible. Messages left after 4 pm will be answered the following business day.   You may also send [place] a message via [place].  We typically respond to MyChart messages within 1-2 business days.  For prescription refills, please ask your pharmacy to contact our office. Our fax number is [phone].  If you have an urgent issue when the clinic is closed that cannot wait until the next business day, you can page your doctor at the number below.    Please note that while we do our best to be available for urgent issues outside of office hours, we are not available 24/7.   If you have an urgent issue and are unable to reach [place], you may choose to seek medical care at your doctor's office, retail clinic, urgent care center, or emergency room.  If you have a medical emergency, please immediately call 911 or go to the emergency department.  Pager Numbers  - Dr. Nehemiah Massed: [phone]  - Dr. Laurence Ferrari: [phone]  - Dr. Nicole Kindred: [phone]  In the event of inclement weather, please call our main line at [phone] for an update on the status of any delays or closures.  Dermatology Medication Tips: Please keep the boxes that topical medications come in in order to help keep track of the instructions about where and how to use these. Pharmacies typically print the medication instructions only on the boxes and not directly on the medication tubes.   If your medication is too expensive, please contact our office at [phone] option 4 or send [place] a message through [place].   We are unable to tell what your co-pay for medications will be in advance as this is different depending on your insurance coverage. However, we may be able to find a substitute medication at lower cost or fill out paperwork to get insurance to cover a needed medication.   If a prior authorization is required to get your medication covered by your insurance company, please allow [place] 1-2 business days to complete this process.  Drug prices often vary depending on where the prescription is filled and some pharmacies may offer cheaper prices.  The  website www.goodrx.com contains coupons for medications through different pharmacies. The prices here do not account for what the cost may be with help from insurance (it may be cheaper with your insurance), but the website can give you the price if you did not use any insurance.  - You can print the associated coupon and take it with your prescription to the pharmacy.  - You may also stop by our office during regular business hours and pick up a GoodRx coupon card.  - If you need your prescription sent electronically to a different pharmacy, notify our office through [facility] or by phone at [phone] option 4.     Si Usted Necesita Algo Despus de Su Visita  Tambin puede enviarnos un mensaje a travs de Pharmacist, community. Por lo general respondemos a los mensajes de MyChart en el transcurso de 1 a 2 das hbiles.  Para renovar recetas, por favor pida a su farmacia que se ponga en contacto con nuestra oficina. Harland Dingwall de fax es [place] [phone].  Si tiene un asunto  urgente cuando la clnica est cerrada y que no puede esperar hasta el siguiente da hbil, puede llamar/localizar a su doctor(a) al nmero que aparece a continuacin.   Por favor, tenga en cuenta que aunque hacemos todo lo posible para estar disponibles para asuntos urgentes fuera del horario de [place], no estamos disponibles las 24 horas del da, los 7 das de la [place].   Si tiene un problema urgente y no puede comunicarse con nosotros, puede optar por buscar atencin mdica  en el consultorio de su doctor(a), en una clnica privada, en un centro de atencin urgente o en una sala de emergencias.  Si tiene Engineering geologist, por favor llame inmediatamente al 911 o vaya a la sala de emergencias.  Nmeros de bper  - Dr. Nehemiah Massed: [phone]  - Dra. Moye: [phone]  - Dra. Nicole Kindred: [phone]  En caso de inclemencias del [place], por favor llame a Johnsie Kindred principal al [phone] para una  actualizacin sobre el [place] de cualquier retraso o cierre.  Consejos para la medicacin en dermatologa: Por favor, guarde las cajas en las que vienen los medicamentos de uso tpico para ayudarle a seguir las instrucciones sobre dnde y cmo usarlos. Las farmacias generalmente imprimen las instrucciones del medicamento slo en las cajas y no directamente en los tubos del [place].   Si su medicamento es muy caro, por favor, pngase en contacto con Zigmund Daniel llamando al [phone] y presione la opcin 4 o envenos un mensaje a travs de Pharmacist, community.   No podemos decirle cul ser su copago por los medicamentos por adelantado ya que esto es diferente dependiendo de la cobertura de su seguro. Sin embargo, es posible que podamos encontrar un medicamento sustituto a Electrical engineer un formulario para que el seguro cubra el medicamento que se considera necesario.   Si se requiere una autorizacin previa para que su compaa de seguros [place] su medicamento, por favor permtanos de 1 a 2 das hbiles para completar este proceso.  Los precios de los medicamentos varan con frecuencia dependiendo del Environmental consultant de dnde se surte la receta y alguna farmacias pueden ofrecer precios ms baratos.  El sitio web www.goodrx.com tiene cupones para medicamentos de Airline pilot. Los precios aqu no tienen en cuenta lo que podra costar con la ayuda del seguro (puede ser ms barato con su seguro), pero el sitio web puede darle el precio si no utiliz Research scientist (physical sciences).  - Puede imprimir el cupn correspondiente y llevarlo con su receta a la farmacia.  - Tambin puede pasar por nuestra oficina durante el horario de atencin regular y Charity fundraiser una tarjeta de cupones de GoodRx.  - Si necesita que su receta se enve electrnicamente a una farmacia diferente, informe a nuestra oficina a travs de MyChart de [place] o por telfono llamando al [phone] y presione la opcin 4.

## 2021-12-16 ENCOUNTER — Encounter: Payer: Self-pay | Admitting: Internal Medicine

## 2021-12-16 ENCOUNTER — Ambulatory Visit (INDEPENDENT_AMBULATORY_CARE_PROVIDER_SITE_OTHER): Payer: Medicare HMO | Admitting: Internal Medicine

## 2021-12-16 VITALS — BP 120/66 | HR 75 | Temp 98.2°F | Ht 60.0 in | Wt 151.2 lb

## 2021-12-16 DIAGNOSIS — R748 Abnormal levels of other serum enzymes: Secondary | ICD-10-CM

## 2021-12-16 DIAGNOSIS — M791 Myalgia, unspecified site: Secondary | ICD-10-CM | POA: Diagnosis not present

## 2021-12-16 DIAGNOSIS — E118 Type 2 diabetes mellitus with unspecified complications: Secondary | ICD-10-CM

## 2021-12-16 DIAGNOSIS — E1169 Type 2 diabetes mellitus with other specified complication: Secondary | ICD-10-CM

## 2021-12-16 DIAGNOSIS — E785 Hyperlipidemia, unspecified: Secondary | ICD-10-CM

## 2021-12-16 DIAGNOSIS — E538 Deficiency of other specified B group vitamins: Secondary | ICD-10-CM | POA: Diagnosis not present

## 2021-12-16 DIAGNOSIS — G8929 Other chronic pain: Secondary | ICD-10-CM

## 2021-12-16 DIAGNOSIS — R509 Fever, unspecified: Secondary | ICD-10-CM | POA: Diagnosis not present

## 2021-12-16 DIAGNOSIS — M2550 Pain in unspecified joint: Secondary | ICD-10-CM | POA: Diagnosis not present

## 2021-12-16 DIAGNOSIS — T466X5A Adverse effect of antihyperlipidemic and antiarteriosclerotic drugs, initial encounter: Secondary | ICD-10-CM

## 2021-12-16 DIAGNOSIS — I700 Atherosclerosis of aorta: Secondary | ICD-10-CM | POA: Diagnosis not present

## 2021-12-16 DIAGNOSIS — U099 Post covid-19 condition, unspecified: Secondary | ICD-10-CM | POA: Diagnosis not present

## 2021-12-16 DIAGNOSIS — F01A4 Vascular dementia, mild, with anxiety: Secondary | ICD-10-CM

## 2021-12-16 DIAGNOSIS — R69 Illness, unspecified: Secondary | ICD-10-CM | POA: Diagnosis not present

## 2021-12-16 LAB — CBC WITH DIFFERENTIAL/PLATELET
Basophils Absolute: 0.1 10*3/uL (ref 0.0–0.1)
Basophils Relative: 0.9 % (ref 0.0–3.0)
Eosinophils Absolute: 0.5 10*3/uL (ref 0.0–0.7)
Eosinophils Relative: 7.8 % — ABNORMAL HIGH (ref 0.0–5.0)
HCT: 38.1 % (ref 36.0–46.0)
Hemoglobin: 12 g/dL (ref 12.0–15.0)
Lymphocytes Relative: 31.6 % (ref 12.0–46.0)
Lymphs Abs: 2 10*3/uL (ref 0.7–4.0)
MCHC: 31.4 g/dL (ref 30.0–36.0)
MCV: 91.9 fl (ref 78.0–100.0)
Monocytes Absolute: 0.6 10*3/uL (ref 0.1–1.0)
Monocytes Relative: 8.5 % (ref 3.0–12.0)
Neutro Abs: 3.3 10*3/uL (ref 1.4–7.7)
Neutrophils Relative %: 51.2 % (ref 43.0–77.0)
Platelets: 298 10*3/uL (ref 150.0–400.0)
RBC: 4.14 Mil/uL (ref 3.87–5.11)
RDW: 13.9 % (ref 11.5–15.5)
WBC: 6.5 10*3/uL (ref 4.0–10.5)

## 2021-12-16 LAB — COMPREHENSIVE METABOLIC PANEL
ALT: 335 U/L — ABNORMAL HIGH (ref 0–35)
AST: 243 U/L — ABNORMAL HIGH (ref 0–37)
Albumin: 4.1 g/dL (ref 3.5–5.2)
Alkaline Phosphatase: 338 U/L — ABNORMAL HIGH (ref 39–117)
BUN: 19 mg/dL (ref 6–23)
CO2: 29 mEq/L (ref 19–32)
Calcium: 10 mg/dL (ref 8.4–10.5)
Chloride: 101 mEq/L (ref 96–112)
Creatinine, Ser: 0.83 mg/dL (ref 0.40–1.20)
GFR: 71.72 mL/min (ref >60.00)
Glucose, Bld: 112 mg/dL — ABNORMAL HIGH (ref 70–99)
Potassium: 4.8 mEq/L (ref 3.5–5.1)
Sodium: 138 mEq/L (ref 135–145)
Total Bilirubin: 0.5 mg/dL (ref 0.2–1.2)
Total Protein: 6.8 g/dL (ref 6.0–8.3)

## 2021-12-16 LAB — SEDIMENTATION RATE: Sed Rate: 9 mm/hr (ref 0–30)

## 2021-12-16 LAB — C-REACTIVE PROTEIN: CRP: <1 mg/dL (ref 0.5–20.0)

## 2021-12-16 LAB — HEMOGLOBIN A1C: Hgb A1c MFr Bld: 6.3 % (ref 4.6–6.5)

## 2021-12-16 LAB — B12 AND FOLATE PANEL
Folate: 15.2 ng/mL (ref >5.9)
Vitamin B-12: 549 pg/mL (ref 211–911)

## 2021-12-16 MED ORDER — DULOXETINE HCL 60 MG PO CPEP: 60.0000 mg | ORAL_CAPSULE | Freq: Every day | ORAL | 2 refills | Status: DC

## 2021-12-16 MED ORDER — CYANOCOBALAMIN 1000 MCG/ML IJ SOLN: 1000.0000 ug | INTRAMUSCULAR | 4 refills | Status: DC

## 2021-12-16 NOTE — Patient Instructions (Addendum)
I have increased your cymbalta to one 60 mg capsule daily   Buspirone dose can be increased to 10 mg every day by mid morning,  and increase evening dose 20 mg   Resume aspirin but take it every other day for the health benefits

## 2021-12-16 NOTE — Telephone Encounter (Signed)
Medication has been picked up.  

## 2021-12-16 NOTE — Progress Notes (Addendum)
Subjective:  Patient ID: Katherine Jimenez, female    DOB: [birth year]  Age: 71 y.o. MRN: [id number]  CC: The primary encounter diagnosis was Fever of unknown origin. Diagnoses of Polyarthralgia, B12 deficiency, Hyperlipidemia associated with type 2 diabetes mellitus ([place]), COVID-19 long hauler manifesting chronic joint pain, DM type 2, controlled, with complication ([place]), Myalgia due to statin, Thoracic aortic atherosclerosis ([place]), Vascular dementia, mild, with anxiety ([place]), and Elevated liver enzymes were also pertinent to this visit.   HPI AGUSTA HACKENBERG presents for follow up on chronic conditions , including  recurrent episodes of low grade fevers and diffuse body aches.   Chief Complaint  Patient presents with   Acute Visit    Fever and aching   Katherine Jimenez is a 71 yr old female with fibromyalgia, bipolar disorder and chronic low back pain with lumbar radiculitis  who presents for follow up on   1) recurrent episodes of subjective low grade fevers (max 99.4)  accompanied by  diffuse joint aches.  Her pain episodes used to be infrequent  but last year after recovering from [place] then a CVA (attributed to COVID),  then Norovirus,  she has felt generally bad and has episodes weekly.  She has persistent pain in some joints and recurrent pain in others. She has had a rheumatologic evaluation for autoimmune forms of polyarthritis .  Screening labs were normal and her exam was negative for synovitis,   negative,  and was denied a second opinion by [facility] after they reviewed .Dr. Serita Grit notes.  Dr Posey Pronto offered her Placquenil,  but she decided to stop taking Placquenil due to safety concerns. She is taking cymbalta ,  current dose is 40 mg.  Using oral naproxen  daily and prn tramadol .  She has stopped taking aspirin due to excessive bruising   2) type 2 DM  using low dose ozempic :  She is not trying to lose weight. Checking  blood sugars less than once daily at variable times, usually only  if she feels she may be having a hypoglycemic event. .  BS have been under 130 fasting and < 150 post prandially.  Denies any recent hypoglyemic events. Following a carbohydrate modified diet 6 days per week. Denies numbness, burning and tingling of extremities. Appetite is good.    3) vascular dementia:  she is tolerating aricept , prescribed by Dr Manuella Ghazi.  Her cognitive state has not progressed.   4) Hypertension: patient checks blood pressure twice weekly at home.  Readings have been for the most part < 130/80 at rest . Patient is following a reduced salt diet most days and is taking losartan  as prescribed    Outpatient Medications Prior to Visit  Medication Sig Dispense Refill   acetaminophen (TYLENOL) 325 MG tablet Take 650 mg by mouth every 6 (six) hours as needed.     aspirin EC 81 MG tablet Take 81 mg by mouth daily. Swallow whole.     Biotin 5 MG CAPS Take 2 capsules by mouth 2 (two) times daily.      blood glucose meter kit and supplies Dispense Contour next bayer meter. E11.9 To check blood glucose  Once a day. 1 each 0   busPIRone (BUSPAR) 10 MG tablet Take 1 tablet (10 mg total) by mouth 3 (three) times daily. 90 tablet 2   Cholecalciferol (VITAMIN D3) 1000 units CAPS Take 1,000 Units by mouth daily.      donepezil (ARICEPT) 5 MG tablet Take 5  mg by mouth daily.     glucose blood (FREESTYLE LITE) test strip Use once daily  as instructed to test blood sugar E 11.9 100 each 3   lamoTRIgine (LAMICTAL) 200 MG tablet TAKE ONE TABLET BY MOUTH TWICE A DAY 180 tablet 1   Lancets (FREESTYLE) lancets Use once daily to check blood sugars  E11.9 100 each 3   losartan (COZAAR) 50 MG tablet Take 1 tablet (50 mg total) by mouth daily. 90 tablet 3   naproxen sodium (ALEVE) 220 MG tablet Take 220 mg by mouth daily as needed.     nitroGLYCERIN (NITROSTAT) 0.4 MG SL tablet Place 1 tablet (0.4 mg total) under the tongue every 5 (five) minutes as needed for chest pain. 50 tablet 3   omeprazole  (PRILOSEC) 40 MG capsule TAKE ONE CAPSULE BY MOUTH DAILY 90 capsule 3   Semaglutide,0.25 or 0.5MG/DOS, (OZEMPIC, 0.25 OR 0.5 MG/DOSE,) 2 MG/1.5ML SOPN Inject 0.5 mg into the skin once a week. Inject 0.25 mg weekly for 4 weeks then increase to 0.5 mg weekly 1.5 mL 0   Syringe, Disposable, 1 ML MISC For use with B12 injections 25 each 3   tretinoin (RETIN-A) 0.05 % cream Apply pea-sized amount to face at bedtime, wash off in morning. 20 g 3   zolpidem (AMBIEN) 10 MG tablet Take 1 tablet (10 mg total) by mouth at bedtime as needed for sleep. 30 tablet 5   cyanocobalamin (,VITAMIN B-12,) 1000 MCG/ML injection INJECT 1 ML INTO THE MUSCLE EVERY 30 DAYS 3 mL 1   DULoxetine (CYMBALTA) 20 MG capsule Take 1 capsule (20 mg total) by mouth daily. TAKE WITH 40 MG CAPSULE 30 capsule 3   DULoxetine HCl 40 MG CPEP TAKE ONE CAPSULE BY MOUTH DAILY 30 capsule 2   cyclobenzaprine (FLEXERIL) 5 MG tablet TAKE ONE TABLET BY MOUTH THREE TIMES A DAY AS NEEDED FOR MUSCLE SPASMS (Patient not taking: Reported on 12/16/2021) 30 tablet 0   diphenhydramine-acetaminophen (TYLENOL PM) 25-500 MG TABS tablet Take 1 tablet by mouth at bedtime as needed. (Patient not taking: Reported on 12/16/2021)     hydrOXYzine (VISTARIL) 25 MG capsule Take 1 capsule (25 mg total) by mouth 3 (three) times daily. (Patient not taking: Reported on 12/16/2021) 90 capsule 0   losartan (COZAAR) 25 MG tablet TAKE TWO TABLETS BY MOUTH DAILY (Patient not taking: Reported on 12/16/2021) 180 tablet 0   moxifloxacin (VIGAMOX) 0.5 % ophthalmic solution Place 1 drop into both eyes 3 (three) times daily. (Patient not taking: Reported on 12/16/2021) 3 mL 0   No facility-administered medications prior to visit.    Review of Systems;  Patient denies headache, fevers, malaise, unintentional weight loss, skin rash, eye pain, sinus congestion and sinus pain, sore throat, dysphagia,  hemoptysis , cough, dyspnea, wheezing, chest pain, palpitations, orthopnea, edema,  abdominal pain, nausea, melena, diarrhea, constipation, flank pain, dysuria, hematuria, urinary  Frequency, nocturia, numbness, tingling, seizures,  Focal weakness, Loss of consciousness,  Tremor, insomnia, depression, anxiety, and suicidal ideation.      Objective:  BP 120/66 (BP Location: Left Arm, Patient Position: Sitting, Cuff Size: Normal)   Pulse 75   Temp 98.2 F (36.8 C) (Oral)   Ht 5' (1.524 m)   Wt 151 lb 3.2 oz (68.6 kg)   SpO2 97%   BMI 29.53 kg/m   BP Readings from Last 3 Encounters:  12/16/21 120/66  10/27/21 (!) 164/96  10/21/21 (!) 140/94    Wt Readings from Last 3 Encounters:  12/16/21 151 lb 3.2 oz (68.6 kg)  11/03/21 149 lb (67.6 kg)  09/27/21 148 lb 12.8 oz (67.5 kg)    General appearance: alert, cooperative and appears stated age Ears: normal TM's and external ear canals both ears Throat: lips, mucosa, and tongue normal; teeth and gums normal Neck: no adenopathy, no carotid bruit, supple, symmetrical, trachea midline and thyroid not enlarged, symmetric, no tenderness/mass/nodules Back: symmetric, no curvature. ROM normal. No CVA tenderness. Lungs: clear to auscultation bilaterally Heart: regular rate and rhythm, S1, S2 normal, no murmur, click, rub or gallop Abdomen: soft, non-tender; bowel sounds normal; no masses,  no organomegaly Pulses: 2+ and symmetric Skin: Skin color, texture, turgor normal. No rashes or lesions Lymph nodes: Cervical, supraclavicular, and axillary nodes normal. Neuro:  awake and interactive with normal mood and affect. Higher cortical functions are normal. Speech is clear without word-finding difficulty or dysarthria. Extraocular movements are intact. Visual fields of both eyes are grossly intact. Sensation to light touch is grossly intact bilaterally of upper and lower extremities. Motor examination shows 4+/5 symmetric hand grip and upper extremity and 5/5 lower extremity strength. There is no pronation or drift. Gait is non-ataxic    Lab Results  Component Value Date   HGBA1C 6.3 12/16/2021   HGBA1C 6.2 07/04/2021   HGBA1C 6.0 (H) 03/22/2021    Lab Results  Component Value Date   CREATININE 0.83 12/16/2021   CREATININE 0.74 08/06/2021   CREATININE 0.77 07/21/2021    Lab Results  Component Value Date   WBC 6.5 12/16/2021   HGB 12.0 12/16/2021   HCT 38.1 12/16/2021   PLT 298.0 12/16/2021   GLUCOSE 112 (H) 12/16/2021   CHOL 149 05/12/2021   TRIG 119.0 05/12/2021   HDL 86.20 05/12/2021   LDLDIRECT 112.0 10/13/2016   LDLCALC 39 05/12/2021   ALT 335 (H) 12/16/2021   AST 243 (H) 12/16/2021   NA 138 12/16/2021   K 4.8 12/16/2021   CL 101 12/16/2021   CREATININE 0.83 12/16/2021   BUN 19 12/16/2021   CO2 29 12/16/2021   TSH 0.671 08/06/2021   INR 1.0 03/21/2021   HGBA1C 6.3 12/16/2021   MICROALBUR 1.0 07/04/2021    No results found.  Assessment & Plan:   Problem List Items Addressed This Visit     B12 deficiency   Relevant Orders   B12 and Folate Panel (Completed)   COVID-19 long hauler manifesting chronic joint pain    Reviewed previous rheumatologic workups and repeated ESR and CRP which are again normal.  Reviewed  alternative treatments for pain given her histofy or FM. Today we increased duloxetine dose to 60 mg daily as a trial.    Lab Results  Component Value Date   ESRSEDRATE 9 12/16/2021   Lab Results  Component Value Date   CRP <1.0 12/16/2021         Relevant Medications   DULoxetine (CYMBALTA) 60 MG capsule   DM type 2, controlled, with complication ([place])    Diagnosed with A1c of 6.6 in Jan 2022.  Managed with ozempic for the past year. Advised to resume aspirin given her history of CVA .  Statins are C/I given histor yof durg induced hepatitis.  Continue losartan   Lab Results  Component Value Date   HGBA1C 6.3 12/16/2021   Lab Results  Component Value Date   LABMICR See below: 03/17/2019   LABMICR See below: 02/03/2019   MICROALBUR 1.0 07/04/2021   MICROALBUR  2.0 04/16/2020  Elevated liver enzymes    Occurring in the setting of low grade fevers and polyarthralgias.  Etiology unclear.  Will repeat liver enzymes. Repeat screening for Hepatitis , CMV,  EBV       Relevant Orders   Ambulatory referral to Infectious Disease   Ambulatory referral to Gastroenterology   Hepatic function panel   CMV IgM   Cmv antibody, IgG (EIA)   Epstein-Barr virus VCA antibody panel   CK (Creatine Kinase)   Hepatitis, Acute   HIV antibody (with reflex)   Hyperlipidemia associated with type 2 diabetes mellitus ([place])   Relevant Orders   Hemoglobin A1c (Completed)   Comprehensive metabolic panel (Completed)   Myalgia due to statin    She had lliver enzyme elevation with atorvastatin trial.       Thoracic aortic atherosclerosis ([place])    Untreated due to  Trial of  atorvastatin causing  liver enzyme elevatiion .        Vascular dementia, mild, with anxiety ([place])    Cognitive deficits have not progressed.  She has  stopped Namenda and resumed aricept       Relevant Medications   DULoxetine (CYMBALTA) 60 MG capsule   Other Visit Diagnoses     Fever of unknown origin    -  Primary   Relevant Orders   C-reactive protein (Completed)   Ambulatory referral to Infectious Disease   Ambulatory referral to Gastroenterology   HIV antibody (with reflex)   Polyarthralgia       Relevant Orders   Sedimentation rate (Completed)   C-reactive protein (Completed)   CBC with Differential/Platelet (Completed)   Ambulatory referral to Infectious Disease   Ambulatory referral to Gastroenterology       I spent a total of  31 minutes with this patient in a face to face visit on the date of this encounter reviewing the last office visit with me in  June   her most recent visit with rheumatology,  patient's diet and exercise habits, home blood pressure /blood sugar readings, June ER visit including labs and imaging studies ,   and post visit ordering of testing  and therapeutics.    Follow-up: Return in about 6 months (around 06/16/2022) for follow up diabetes.   Crecencio Mc, MD

## 2021-12-18 ENCOUNTER — Encounter: Payer: Self-pay | Admitting: Internal Medicine

## 2021-12-18 DIAGNOSIS — E118 Type 2 diabetes mellitus with unspecified complications: Secondary | ICD-10-CM | POA: Insufficient documentation

## 2021-12-18 DIAGNOSIS — M791 Myalgia, unspecified site: Secondary | ICD-10-CM | POA: Insufficient documentation

## 2021-12-18 DIAGNOSIS — T466X5A Adverse effect of antihyperlipidemic and antiarteriosclerotic drugs, initial encounter: Secondary | ICD-10-CM | POA: Insufficient documentation

## 2021-12-18 NOTE — Assessment & Plan Note (Signed)
Reviewed previous rheumatologic workups and repeated ESR and CRP which are again normal.  Reviewed  alternative treatments for pain given her histofy or FM. Today we increased duloxetine dose to 60 mg daily as a trial.    Lab Results  Component Value Date   ESRSEDRATE 9 12/16/2021   Lab Results  Component Value Date   CRP <1.0 12/16/2021

## 2021-12-18 NOTE — Assessment & Plan Note (Addendum)
Occurring in the setting of low grade fevers and polyarthralgias.  Etiology unclear.  Will repeat liver enzymes. Repeat screening for Hepatitis , CMV,  EBV

## 2021-12-18 NOTE — Assessment & Plan Note (Addendum)
Diagnosed with A1c of 6.6 in Jan 2022.  Managed with ozempic for the past year. Advised to resume aspirin given her history of CVA .  Statins are C/I given histor yof durg induced hepatitis.  Continue losartan   Lab Results  Component Value Date   HGBA1C 6.3 12/16/2021   Lab Results  Component Value Date   LABMICR See below: 03/17/2019   LABMICR See below: 02/03/2019   MICROALBUR 1.0 07/04/2021   MICROALBUR 2.0 04/16/2020

## 2021-12-18 NOTE — Addendum Note (Signed)
Addended by: Crecencio Mc on: 12/18/2021 09:07 PM   Modules accepted: Orders

## 2021-12-18 NOTE — Assessment & Plan Note (Signed)
Untreated due to  Trial of  atorvastatin causing  liver enzyme elevatiion .   

## 2021-12-18 NOTE — Assessment & Plan Note (Signed)
She had lliver enzyme elevation with atorvastatin trial.

## 2021-12-18 NOTE — Assessment & Plan Note (Signed)
Cognitive deficits have not progressed.  She has  stopped Namenda and resumed aricept

## 2021-12-19 ENCOUNTER — Telehealth: Payer: Self-pay

## 2021-12-19 NOTE — Telephone Encounter (Signed)
Patient called about MyChart and note below.  She has questions about the labs and referrals.

## 2021-12-19 NOTE — Telephone Encounter (Signed)
Patient states she is calling to follow-up on her MyChart message, what to take for pain.  Patient states she is also wondering why her levels are elevated (Alk. Phosph, Ast, and Alt).

## 2021-12-20 ENCOUNTER — Other Ambulatory Visit: Payer: Self-pay | Admitting: Internal Medicine

## 2021-12-20 ENCOUNTER — Other Ambulatory Visit (INDEPENDENT_AMBULATORY_CARE_PROVIDER_SITE_OTHER): Payer: Medicare HMO

## 2021-12-20 DIAGNOSIS — R748 Abnormal levels of other serum enzymes: Secondary | ICD-10-CM | POA: Diagnosis not present

## 2021-12-20 DIAGNOSIS — R509 Fever, unspecified: Secondary | ICD-10-CM

## 2021-12-20 LAB — HEPATIC FUNCTION PANEL
ALT: 73 U/L — ABNORMAL HIGH (ref 0–35)
AST: 16 U/L (ref 0–37)
Albumin: 4.2 g/dL (ref 3.5–5.2)
Alkaline Phosphatase: 230 U/L — ABNORMAL HIGH (ref 39–117)
Bilirubin, Direct: 0.1 mg/dL (ref 0.0–0.3)
Total Bilirubin: 0.5 mg/dL (ref 0.2–1.2)
Total Protein: 6.9 g/dL (ref 6.0–8.3)

## 2021-12-20 LAB — CK: Total CK: 69 U/L (ref 7–177)

## 2021-12-20 NOTE — Telephone Encounter (Signed)
Pt called about previous message

## 2021-12-20 NOTE — Telephone Encounter (Signed)
Patient states she is following-up on her message to see what she can take for pain.  I let patient know that her message has been routed to Dr. Deborra Medina, who is in clinic, and we will call her back as soon as we hear from Dr. Derrel Nip.

## 2021-12-20 NOTE — Telephone Encounter (Signed)
Patient says she has all over pain that starts most of the time when she gets hot her pain when at its worst is rated at a 9 today rated at a 5 today and she would like to know what she can take for pain since she was advised no NSAIDS and no tylenol. Schedule the additional labs fortoday and advised patient to except the referral to infectious disease to make sure she calls them back and excepts the appt she turned down yesterday. Patient says she will call them back. Patient say fo GI referral she would like Dr. Allen Norris.

## 2021-12-21 ENCOUNTER — Other Ambulatory Visit: Payer: Self-pay | Admitting: Internal Medicine

## 2021-12-21 ENCOUNTER — Telehealth: Payer: Self-pay | Admitting: Internal Medicine

## 2021-12-21 LAB — EPSTEIN-BARR VIRUS VCA ANTIBODY PANEL
EBV NA IgG: 22 U/mL — ABNORMAL HIGH
EBV VCA IgG: 253 U/mL — ABNORMAL HIGH
EBV VCA IgM: <36 U/mL

## 2021-12-21 LAB — CMV ANTIBODY, IGG (EIA): CMV Ab - IgG: <0.6 U/mL (ref 0.00–0.59)

## 2021-12-21 LAB — HEPATITIS PANEL, ACUTE
Hep A IgM: NONREACTIVE
Hep B C IgM: NONREACTIVE
Hepatitis B Surface Ag: NONREACTIVE
Hepatitis C Ab: NONREACTIVE

## 2021-12-21 LAB — HIV ANTIBODY (ROUTINE TESTING W REFLEX): HIV 1&2 Ab, 4th Generation: NONREACTIVE

## 2021-12-21 LAB — CMV IGM: CMV IgM: <30 AU/mL

## 2021-12-21 MED ORDER — TRAMADOL HCL 50 MG PO TABS: 50.0000 mg | ORAL_TABLET | Freq: Three times a day (TID) | ORAL | 0 refills | Status: AC | PRN

## 2021-12-21 NOTE — Telephone Encounter (Signed)
Patient called to follow-up on previous message.  I spoke with Adair Laundry, CMA, and Janett Billow states she will call patient after she speaks with Dr. Deborra Medina.  I relayed message to patient.

## 2021-12-21 NOTE — Telephone Encounter (Signed)
Pt would like to be called about previous mychart message

## 2021-12-21 NOTE — Telephone Encounter (Signed)
See my chart message

## 2021-12-21 NOTE — Telephone Encounter (Signed)
Pt need a refill of tramadol sent to [facility]. Pt would like to be called

## 2021-12-22 ENCOUNTER — Ambulatory Visit: Payer: Medicare HMO | Attending: Infectious Diseases | Admitting: Infectious Diseases

## 2021-12-22 ENCOUNTER — Encounter: Payer: Self-pay | Admitting: Infectious Diseases

## 2021-12-22 VITALS — BP 148/85 | HR 80 | Temp 98.7°F | Ht 60.0 in | Wt 153.0 lb

## 2021-12-22 DIAGNOSIS — M797 Fibromyalgia: Secondary | ICD-10-CM | POA: Insufficient documentation

## 2021-12-22 DIAGNOSIS — Z5986 Financial insecurity: Secondary | ICD-10-CM | POA: Diagnosis not present

## 2021-12-22 DIAGNOSIS — E119 Type 2 diabetes mellitus without complications: Secondary | ICD-10-CM | POA: Insufficient documentation

## 2021-12-22 DIAGNOSIS — Z8249 Family history of ischemic heart disease and other diseases of the circulatory system: Secondary | ICD-10-CM | POA: Insufficient documentation

## 2021-12-22 DIAGNOSIS — M2550 Pain in unspecified joint: Secondary | ICD-10-CM | POA: Insufficient documentation

## 2021-12-22 DIAGNOSIS — Z8673 Personal history of transient ischemic attack (TIA), and cerebral infarction without residual deficits: Secondary | ICD-10-CM | POA: Insufficient documentation

## 2021-12-22 DIAGNOSIS — Z7985 Long-term (current) use of injectable non-insulin antidiabetic drugs: Secondary | ICD-10-CM | POA: Diagnosis not present

## 2021-12-22 DIAGNOSIS — I10 Essential (primary) hypertension: Secondary | ICD-10-CM | POA: Insufficient documentation

## 2021-12-22 DIAGNOSIS — Z7982 Long term (current) use of aspirin: Secondary | ICD-10-CM | POA: Diagnosis not present

## 2021-12-22 DIAGNOSIS — R945 Abnormal results of liver function studies: Secondary | ICD-10-CM | POA: Diagnosis not present

## 2021-12-22 DIAGNOSIS — M549 Dorsalgia, unspecified: Secondary | ICD-10-CM | POA: Diagnosis not present

## 2021-12-22 DIAGNOSIS — Z79899 Other long term (current) drug therapy: Secondary | ICD-10-CM | POA: Insufficient documentation

## 2021-12-22 DIAGNOSIS — F319 Bipolar disorder, unspecified: Secondary | ICD-10-CM | POA: Insufficient documentation

## 2021-12-22 DIAGNOSIS — Z8616 Personal history of COVID-19: Secondary | ICD-10-CM | POA: Diagnosis not present

## 2021-12-22 DIAGNOSIS — M19011 Primary osteoarthritis, right shoulder: Secondary | ICD-10-CM | POA: Diagnosis not present

## 2021-12-22 DIAGNOSIS — R69 Illness, unspecified: Secondary | ICD-10-CM | POA: Diagnosis not present

## 2021-12-22 DIAGNOSIS — R509 Fever, unspecified: Secondary | ICD-10-CM | POA: Insufficient documentation

## 2021-12-22 NOTE — Patient Instructions (Signed)
You have been referred to me for subjective fever, chills , body ache going on for many years- you have had many tests that have all came back normal- recent Liver tests were abnormal but thay was due to tylenol and it has improved since you stopped it-On examination I do not see anything suggestive of infection. I do not have any recommendations for management of your symptoms- please follow up with PCP.

## 2021-12-22 NOTE — Telephone Encounter (Signed)
FYI

## 2021-12-22 NOTE — Progress Notes (Signed)
NAME: Katherine Jimenez  DOB: [birth year]  MRN: [id number]  Date/Time: 12/22/2021 11:47 AM   Subjective:  Pt is referred to me for subjective fever , body ache, back ache, burning eyes going on for the past 3 years- previously 3 times a year and now almost evey 2 weeks ? Katherine Jimenez is a 71 y.o. female with a history of Bipolar disorder, HTN, DM, CVA , abnormal lfts and myalgia due to statin , Fibromylagia,b/l Hip replacements, rt knee replacement has been investigated for joint pain and body ache and subjective fever but tmp has been <99.5 for many years- She has been followed by neurologist Dr.Shah since 2020 for some cognitiive imapirement what was concerning for pseudodementia of depression. MRI done 11/17/18 showed moderate white matter microvascular ischemic and metabolic changes.She was started on aspirin an atorvastain. She also had b12 and d deficiency and was being corrected. She also saw neuropsychologist and had neuropsych testing.There was a concern for atypical alzheimers  A MRI  routine to assess dementia done on 03/17/21 showed an acute infarction of 7.. rt ext capsule. Pt was asymptomatic- she had covid tested by home test 3 weeks prior to that, She was hospitalized 12/19-12/20/22 and started on high dose atorvastain 80m, plavix and asprin. LFTS increased with alkpo4 went to 1009 and increase in AST 204/ALT 422. She also started having arm pain, being feverish and chills . Statin was DC, she saw GI Dr.Wohl and there was a consideration for MRCP and liver biopsy if the lfts did not normalize Her lfts normalized in April 2023 She saw Rheumatologist in May for the polyarthralgia and immne workup was negative Recently she was taking tyelonol 3 hrams a day and on 12/16/21 her LFTS were once again elevated She had extensive workup including CMV/EBV/HEPAtitis, HIV. ESR and CRP and they were all normal- EBV showed increase in IgG antibody She has no cough, sob, headache. Has chills and temp  never more than 99.5- most of the time it is 99  She gets steroid injections to her back Past Medical History:  Diagnosis Date   Arthritis    back, knees, fingers   Bipolar disorder ([place]    Bunion, left    Depression    Diabetes mellitus    diet controlled.    Elevated liver enzymes 05/31/2021   Hypertension    Lab test positive for detection of COVID-19 virus 03/02/2021   Lower back pain    S/P fall   Squamous cell skin cancer, face    [facility]   Treadmill stress test negative for angina pectoris June 2013    Past Surgical History:  Procedure Laterality Date   BLADDER AUGMENTATION     BLADDER SURGERY     CARDIAC CATHETERIZATION  03/05/12   no stents   CATARACT EXTRACTION W/PHACO Right 07/19/2015   Procedure: CATARACT EXTRACTION PHACO AND INTRAOCULAR LENS PLACEMENT ([place];  Surgeon: ARonnell Freshwater MD;  [place]  Service: Ophthalmology;  Laterality: Right;  BORDERLINE DIABETIC - oral meds   CATARACT EXTRACTION W/PHACO Left 05/28/2017   Procedure: CATARACT EXTRACTION PHACO AND INTRAOCULAR LENS PLACEMENT ([place] DIABETIC;  Surgeon: BLeandrew Koyanagi MD;  [place] [facility]  Service: Ophthalmology;  Laterality: Left;  Diabetic - oal meds   JOINT REPLACEMENT  2012   bilateral hip   KNEE ARTHROSCOPY     TOTAL HIP ARTHROPLASTY  2012   TOTAL KNEE ARTHROPLASTY Right 06/04/2017   Procedure: TOTAL KNEE ARTHROPLASTY;  Surgeon:  Lovell Sheehan, MD;  Location: [facility];  Service: Orthopedics;  Laterality: Right;   TUBAL LIGATION     VAGINAL DELIVERY     2    Social History   Socioeconomic History   Marital status: Married    Spouse name: Not on file   Number of children: Not on file   Years of education: Not on file   Highest education level: Not on file  Occupational History   Not on file  Tobacco Use   Smoking status: Never   Smokeless tobacco: Never  Vaping Use   Vaping Use: Never used  Substance and Sexual Activity    Alcohol use: Yes    Alcohol/week: 1.0 standard drink of alcohol    Types: 1 Cans of beer per week    Comment: occassionally   Drug use: No   Sexual activity: Not on file  Other Topics Concern   Not on file  Social History Narrative   Lives in [place] with husband.         Social Determinants of Health   Financial Resource Strain: High Risk (03/24/2021)   Overall Financial Resource Strain (CARDIA)    Difficulty of Paying Living Expenses: Hard  Food Insecurity: No Food Insecurity (12/14/2020)   Hunger Vital Sign    Worried About Running Out of Food in the Last Year: Never true    Ran Out of Food in the Last Year: Never true  Transportation Needs: No Transportation Needs (12/14/2020)   PRAPARE - Hydrologist (Medical): No    Lack of Transportation (Non-Medical): No  Physical Activity: Sufficiently Active (12/14/2020)   Exercise Vital Sign    Days of Exercise per Week: 5 days    Minutes of Exercise per Session: 60 min  Stress: No Stress Concern Present (12/14/2020)   [place]    Feeling of Stress : Not at all  Social Connections: Unknown (12/14/2020)   Social Connection and Isolation Panel [NHANES]    Frequency of Communication with Friends and Family: More than three times a week    Frequency of Social Gatherings with Friends and Family: More than three times a week    Attends Religious Services: Not on file    Active Member of Clubs or Organizations: Not on file    Attends Archivist Meetings: Not on file    Marital Status: Not on file  Intimate Partner Violence: Not At Risk (12/14/2020)   Humiliation, Afraid, Rape, and Kick questionnaire    Fear of Current or Ex-Partner: No    Emotionally Abused: No    Physically Abused: No    Sexually Abused: No    Family History  Problem Relation Age of Onset   Heart disease Mother    Stroke Mother    Heart disease Father     Dementia Father    Heart disease Maternal Grandmother    Heart disease Maternal Grandfather    Heart disease Paternal Grandmother    Heart disease Paternal Grandfather    Breast cancer Maternal Aunt    Allergies  Allergen Reactions   Atorvastatin     Liver enzyme elevation    Thimerosal (Thiomersal) Itching   I? Current Outpatient Medications  Medication Sig Dispense Refill   acetaminophen (TYLENOL) 325 MG tablet Take 650 mg by mouth every 6 (six) hours as needed.     aspirin EC 81 MG tablet Take 81 mg by mouth daily. Swallow  whole.     Biotin 5 MG CAPS Take 2 capsules by mouth 2 (two) times daily.      blood glucose meter kit and supplies Dispense Contour next bayer meter. E11.9 To check blood glucose  Once a day. 1 each 0   busPIRone (BUSPAR) 10 MG tablet Take 1 tablet (10 mg total) by mouth 3 (three) times daily. 90 tablet 2   Cholecalciferol (VITAMIN D3) 1000 units CAPS Take 1,000 Units by mouth daily.      cyanocobalamin (VITAMIN B12) 1000 MCG/ML injection Inject 1 mL (1,000 mcg total) into the muscle every 30 (thirty) days. 3 mL 4   donepezil (ARICEPT) 5 MG tablet Take 5 mg by mouth daily.     DULoxetine (CYMBALTA) 60 MG capsule Take 1 capsule (60 mg total) by mouth daily. 30 capsule 2   glucose blood (FREESTYLE LITE) test strip Use once daily  as instructed to test blood sugar E 11.9 100 each 3   lamoTRIgine (LAMICTAL) 200 MG tablet TAKE ONE TABLET BY MOUTH TWICE A DAY 180 tablet 1   Lancets (FREESTYLE) lancets Use once daily to check blood sugars  E11.9 100 each 3   losartan (COZAAR) 50 MG tablet Take 1 tablet (50 mg total) by mouth daily. 90 tablet 3   naproxen sodium (ALEVE) 220 MG tablet Take 220 mg by mouth daily as needed.     nitroGLYCERIN (NITROSTAT) 0.4 MG SL tablet Place 1 tablet (0.4 mg total) under the tongue every 5 (five) minutes as needed for chest pain. 50 tablet 3   omeprazole (PRILOSEC) 40 MG capsule TAKE ONE CAPSULE BY MOUTH DAILY 90 capsule 3    Semaglutide,0.25 or 0.5MG /DOS, (OZEMPIC, 0.25 OR 0.5 MG/DOSE,) 2 MG/1.5ML SOPN Inject 0.5 mg into the skin once a week. Inject 0.25 mg weekly for 4 weeks then increase to 0.5 mg weekly 1.5 mL 0   Syringe, Disposable, 1 ML MISC For use with B12 injections 25 each 3   traMADol (ULTRAM) 50 MG tablet Take 1 tablet (50 mg total) by mouth every 8 (eight) hours as needed for up to 7 days. 21 tablet 0   tretinoin (RETIN-A) 0.05 % cream Apply pea-sized amount to face at bedtime, wash off in morning. 20 g 3   zolpidem (AMBIEN) 10 MG tablet Take 1 tablet (10 mg total) by mouth at bedtime as needed for sleep. 30 tablet 5   No current facility-administered medications for this visit.     Abtx:  Anti-infectives (From admission, onward)    None       REVIEW OF SYSTEMS:  Const: negative fever,  chills, negative weight loss Eyes: negative diplopia or visual changes, negative eye pain ENT: negative coryza, negative sore throat Resp: negative cough, hemoptysis, dyspnea Cards: negative for chest pain, palpitations, lower extremity edema GU: negative for frequency, dysuria and hematuria GI: Negative for abdominal pain, diarrhea, bleeding, has constipation Skin: negative for rash and pruritus Heme: negative for easy bruising and gum/nose bleeding MS:  myalgias, arthralgias, back pain and muscle weakness Neurolo:negative for headaches, dizziness, vertigo, memory problems  Psych:  anxiety, depression  Endocrine: , diabetes Allergy/Immunology as above Objective:  VITALS:  BP (!) 148/85   Pulse 80   Temp 98.7 F (37.1 C) (Temporal)   Ht 5' (1.524 m)   Wt 153 lb (69.4 kg)   BMI 29.88 kg/m   PHYSICAL EXAM:  General: Alert, cooperative, no distress, appears stated age.  Head: Normocephalic, without obvious abnormality, atraumatic. Eyes: Conjunctivae clear, anicteric  sclerae. Pupils are equal ENT Nares normal. No drainage or sinus tenderness. Lips, mucosa, and tongue normal. No Thrush Neck:  Supple, symmetrical, no adenopathy, thyroid: non tender no carotid bruit and no JVD. Back: No CVA tenderness. Lungs: Clear to auscultation bilaterally. No Wheezing or Rhonchi. No rales. Heart: Regular rate and rhythm, no murmur, rub or gallop. Abdomen: Soft, non-tender,not distended. Bowel sounds normal. No masses Extremities: atraumatic, no cyanosis. No edema. No clubbing Skin: rt side of face- cryo therapy wound Lymph: Cervical, supraclavicular normal. Neurologic: Grossly non-focal Pertinent Labs Lab Results CBC    Component Value Date/Time   WBC 6.5 12/16/2021 0927   RBC 4.14 12/16/2021 0927   HGB 12.0 12/16/2021 0927   HGB 13.0 10/10/2016 1543   HCT 38.1 12/16/2021 0927   HCT 39.2 10/10/2016 1543   PLT 298.0 12/16/2021 0927   PLT 323 10/10/2016 1543   MCV 91.9 12/16/2021 0927   MCV 94 10/10/2016 1543   MCV 92 07/22/2012 1642   MCH 29.7 08/06/2021 1210   MCHC 31.4 12/16/2021 0927   RDW 13.9 12/16/2021 0927   RDW 13.9 10/10/2016 1543   RDW 13.2 07/22/2012 1642   LYMPHSABS 2.0 12/16/2021 0927   LYMPHSABS 2.5 10/10/2016 1543   LYMPHSABS 2.2 07/22/2012 1642   MONOABS 0.6 12/16/2021 0927   MONOABS 0.4 07/22/2012 1642   EOSABS 0.5 12/16/2021 0927   EOSABS 0.1 10/10/2016 1543   EOSABS 0.2 07/22/2012 1642   BASOSABS 0.1 12/16/2021 0927   BASOSABS 0.0 10/10/2016 1543   BASOSABS 0.0 07/22/2012 1642       Latest Ref Rng & Units 12/20/2021   10:57 AM 12/16/2021    9:27 AM 08/06/2021   12:10 PM  CMP  Glucose 70 - 99 mg/dL  112  153   BUN 6 - 23 mg/dL  19  14   Creatinine 0.40 - 1.20 mg/dL  0.83  0.74   Sodium 135 - 145 mEq/L  138  133   Potassium 3.5 - 5.1 mEq/L  4.8  4.4   Chloride 96 - 112 mEq/L  101  96   CO2 19 - 32 mEq/L  29  25   Calcium 8.4 - 10.5 mg/dL  10.0  9.7   Total Protein 6.0 - 8.3 g/dL 6.9  6.8  7.2   Total Bilirubin 0.2 - 1.2 mg/dL 0.5  0.5  0.7   Alkaline Phos 39 - 117 U/L 230  338  86   AST 0 - 37 U/L 16  243  19   ALT 0 - 35 U/L 73  335  19      ? Impression/Recommendation Chronic polyarthralgia, muscle pain, back pain, chills and subjective fever ( but < 99.5) in a patient with fibromyalgia, bipolar disorder, HTN, DM She had minimally symptomatic covid in Dec 2022  Clinically there is no evidence of any infection  Temp of < 99.5 is not significant for any infection This is not typical for long  covid as these symptoms preceded covid Normal ESR/CRP/ HIV, Hepatitis panel  Abnormal LFTS - with 8% eosniophilia could be due to medication and the lfts have improved since she stopped tylenol Follow with GI Htn on losartan DM on ozempic  Microvascular dementia VS pseudodementia on aricept  Bipolar on meds  ? ___________________________________________________ Discussed with patient,  No further management from ID  Note:  This document was prepared using Dragon voice recognition software and may include unintentional dictation errors.

## 2021-12-22 NOTE — Telephone Encounter (Signed)
Sent by Dr.Tullo yesterday 12/21/21. See note

## 2021-12-26 MED ORDER — GABAPENTIN 100 MG PO CAPS: 100.0000 mg | ORAL_CAPSULE | Freq: Three times a day (TID) | ORAL | 3 refills | Status: DC

## 2021-12-26 NOTE — Telephone Encounter (Signed)
Patient says tramadol not helping with her all over pain and wants to know if she still needs to see GI.

## 2021-12-26 NOTE — Addendum Note (Signed)
Addended by: Crecencio Mc on: 12/26/2021 05:25 PM   Modules accepted: Orders

## 2021-12-27 ENCOUNTER — Other Ambulatory Visit: Payer: Self-pay | Admitting: Internal Medicine

## 2021-12-27 ENCOUNTER — Telehealth: Payer: Self-pay | Admitting: Internal Medicine

## 2021-12-27 NOTE — Telephone Encounter (Signed)
Spoke with pt and informed her of trying the 100 mg Gabapentin TID and if that does not work she can try the 400 mg motrin every 12 hours and 500 mg tylenol every 12 hours. Pt gave a verbal understanding.

## 2021-12-27 NOTE — Telephone Encounter (Signed)
Patient asking if she can take Tylenol or motrine products. She stated she is still in a lot of pain even with the tramadol. She would like a call back today.

## 2022-01-02 ENCOUNTER — Other Ambulatory Visit: Payer: Self-pay | Admitting: Internal Medicine

## 2022-01-02 ENCOUNTER — Encounter: Payer: Self-pay | Admitting: Internal Medicine

## 2022-01-02 ENCOUNTER — Telehealth (INDEPENDENT_AMBULATORY_CARE_PROVIDER_SITE_OTHER): Payer: Medicare HMO | Admitting: Internal Medicine

## 2022-01-02 VITALS — Ht 60.0 in | Wt 153.0 lb

## 2022-01-02 DIAGNOSIS — M797 Fibromyalgia: Secondary | ICD-10-CM | POA: Diagnosis not present

## 2022-01-02 DIAGNOSIS — R748 Abnormal levels of other serum enzymes: Secondary | ICD-10-CM | POA: Diagnosis not present

## 2022-01-02 NOTE — Progress Notes (Signed)
Virtual Visit via [facility]   Note    This format is felt to be most appropriate for this patient at this time.  All issues noted in this document were discussed and addressed.  No physical exam was performed (except for noted visual exam findings with Video Visits).   I connected with Katherine Jimenez on 01/02/22 at  4:30 PM EDT by a video enabled telemedicine application e and verified that I am speaking with the correct person using two identifiers. Location patient: home Location provider: work or home office Persons participating in the virtual visit: patient, provider  I discussed the limitations, risks, security and privacy concerns of performing an evaluation and management service by telephone and the availability of in person appointments. I also discussed with the patient that there may be a patient responsible charge related to this service. The patient expressed understanding and agreed to proceed.  Reason for visit: chronic daily pain   HPI:  71 yr old female with fibromyalgia and  low back pain  presents for follow up on elevated liver enzymes, recent ID evaluation for recurrent low grade fevers and diffuse myalgias.   Patient is currently taking gabapentin 100 mg tid,  tylenol 1000 mg daily and ibuprofen 800 mg daily.  Trial of tramadol was ineffective so she has stopped using it and prefers to avoid controlled substances..  taking cymbalta chronically for depression/chronic pain  ID visit reviewed:  no evidence of infectious process based on exam .  Labs,  etc.  Liver enzymes are improving based on last week's repeat levels.   ROS: See pertinent positives and negatives per HPI.  Past Medical History:  Diagnosis Date   Arthritis    back, knees, fingers   Bipolar disorder ([place])    Bunion, left    Depression    Diabetes mellitus    diet controlled.    Elevated liver enzymes 05/31/2021   Hypertension    Lab test positive for detection of COVID-19 virus 03/02/2021   Lower back  pain    S/P fall   Squamous cell skin cancer, face    [facility]   Treadmill stress test negative for angina pectoris June 2013    Past Surgical History:  Procedure Laterality Date   BLADDER AUGMENTATION     BLADDER SURGERY     CARDIAC CATHETERIZATION  03/05/12   no stents   CATARACT EXTRACTION W/PHACO Right 07/19/2015   Procedure: CATARACT EXTRACTION PHACO AND INTRAOCULAR LENS PLACEMENT ([place]) right eye;  Surgeon: Ronnell Freshwater, MD;  Location: [place];  Service: Ophthalmology;  Laterality: Right;  BORDERLINE DIABETIC - oral meds   CATARACT EXTRACTION W/PHACO Left 05/28/2017   Procedure: CATARACT EXTRACTION PHACO AND INTRAOCULAR LENS PLACEMENT ([place]) LEFT DIABETIC;  Surgeon: Leandrew Koyanagi, MD;  Location: [place];  Service: Ophthalmology;  Laterality: Left;  Diabetic - oal meds   JOINT REPLACEMENT  2012   bilateral hip   KNEE ARTHROSCOPY     TOTAL HIP ARTHROPLASTY  2012   TOTAL KNEE ARTHROPLASTY Right 06/04/2017   Procedure: TOTAL KNEE ARTHROPLASTY;  Surgeon: Lovell Sheehan, MD;  Location: [facility];  Service: Orthopedics;  Laterality: Right;   TUBAL LIGATION     VAGINAL DELIVERY     2    Family History  Problem Relation Age of Onset   Heart disease Mother    Stroke Mother    Heart disease Father    Dementia Father    Heart disease Maternal Grandmother    Heart  disease Maternal Grandfather    Heart disease Paternal Grandmother    Heart disease Paternal Grandfather    Breast cancer Maternal Aunt     SOCIAL HX:  reports that she has never smoked. She has never used smokeless tobacco. She reports current alcohol use of about 1.0 standard drink of alcohol per week. She reports that she does not use drugs.    Current Outpatient Medications:    acetaminophen (TYLENOL) 325 MG tablet, Take 650 mg by mouth every 6 (six) hours as needed., Disp: , Rfl:    aspirin EC 81 MG tablet, Take 81 mg by mouth daily. Swallow whole., Disp: , Rfl:    Biotin 5 MG  CAPS, Take 2 capsules by mouth 2 (two) times daily. , Disp: , Rfl:    blood glucose meter kit and supplies, Dispense Contour next bayer meter. E11.9 To check blood glucose  Once a day., Disp: 1 each, Rfl: 0   busPIRone (BUSPAR) 10 MG tablet, Take 1 tablet (10 mg total) by mouth 3 (three) times daily., Disp: 90 tablet, Rfl: 2   Cholecalciferol (VITAMIN D3) 1000 units CAPS, Take 1,000 Units by mouth daily. , Disp: , Rfl:    cyanocobalamin (VITAMIN B12) 1000 MCG/ML injection, Inject 1 mL (1,000 mcg total) into the muscle every 30 (thirty) days., Disp: 3 mL, Rfl: 4   donepezil (ARICEPT) 5 MG tablet, Take 5 mg by mouth daily., Disp: , Rfl:    DULoxetine (CYMBALTA) 60 MG capsule, Take 1 capsule (60 mg total) by mouth daily., Disp: 30 capsule, Rfl: 2   gabapentin (NEURONTIN) 100 MG capsule, Take 1 capsule (100 mg total) by mouth 3 (three) times daily., Disp: 90 capsule, Rfl: 3   glucose blood (FREESTYLE LITE) test strip, Use once daily  as instructed to test blood sugar E 11.9, Disp: 100 each, Rfl: 3   lamoTRIgine (LAMICTAL) 200 MG tablet, TAKE ONE TABLET BY MOUTH TWICE A DAY, Disp: 180 tablet, Rfl: 1   Lancets (FREESTYLE) lancets, Use once daily to check blood sugars  E11.9, Disp: 100 each, Rfl: 3   losartan (COZAAR) 50 MG tablet, Take 1 tablet (50 mg total) by mouth daily., Disp: 90 tablet, Rfl: 3   naproxen sodium (ALEVE) 220 MG tablet, Take 220 mg by mouth daily as needed., Disp: , Rfl:    nitroGLYCERIN (NITROSTAT) 0.4 MG SL tablet, Place 1 tablet (0.4 mg total) under the tongue every 5 (five) minutes as needed for chest pain., Disp: 50 tablet, Rfl: 3   omeprazole (PRILOSEC) 40 MG capsule, TAKE ONE CAPSULE BY MOUTH DAILY, Disp: 90 capsule, Rfl: 3   Semaglutide,0.25 or 0.5MG/DOS, (OZEMPIC, 0.25 OR 0.5 MG/DOSE,) 2 MG/1.5ML SOPN, Inject 0.5 mg into the skin once a week. Inject 0.25 mg weekly for 4 weeks then increase to 0.5 mg weekly, Disp: 1.5 mL, Rfl: 0   Syringe, Disposable, 1 ML MISC, For use with B12  injections, Disp: 25 each, Rfl: 3   tretinoin (RETIN-A) 0.05 % cream, Apply pea-sized amount to face at bedtime, wash off in morning., Disp: 20 g, Rfl: 3   zolpidem (AMBIEN) 10 MG tablet, Take 1 tablet (10 mg total) by mouth at bedtime as needed for sleep., Disp: 30 tablet, Rfl: 5  EXAM:  VITALS per patient if applicable:  GENERAL: alert, oriented, appears well and in no acute distress  HEENT: atraumatic, conjunttiva clear, no obvious abnormalities on inspection of external nose and ears  NECK: normal movements of the head and neck  LUNGS: on inspection  no signs of respiratory distress, breathing rate appears normal, no obvious gross SOB, gasping or wheezing  CV: no obvious cyanosis  MS: moves all visible extremities without noticeable abnormality  PSYCH/NEURO: pleasant and cooperative, no obvious depression or anxiety, speech and thought processing grossly intact  ASSESSMENT AND PLAN:  Discussed the following assessment and plan:  Elevated liver enzymes - Plan: Hepatic function panel  Fibromyalgia  Fibromyalgia discussed increasing her gabapentin to 600 mg daily in divided doses (100 mg qam,  20 mg in the afternoon,   300 mg at bedtime to improve sleep disrupted  by pain.  encouraged to continue exercising regularly to avoid deconditioning  Will reconsider trial of savella if insurance will cover,  But will require dc Cymbalta     I discussed the assessment and treatment plan with the patient. The patient was provided an opportunity to ask questions and all were answered. The patient agreed with the plan and demonstrated an understanding of the instructions.   The patient was advised to call back or seek an in-person evaluation if the symptoms worsen or if the condition fails to improve as anticipated.   I spent 30 minutes dedicated to the care of this patient on the date of this encounter to include pre-visit review of her medical history,  including recent ID evaluation,  labs  and imaging studies , Face-to-face time with the patient , and post visit ordering of testing and therapeutics.    Crecencio Mc, MD

## 2022-01-03 NOTE — Assessment & Plan Note (Addendum)
discussed increasing her gabapentin to 600 mg daily in divided doses (100 mg qam,  20 mg in the afternoon,   300 mg at bedtime to improve sleep disrupted  by pain.  encouraged to continue exercising regularly to avoid deconditioning  Will reconsider trial of savella if insurance will cover,  But will require dc Cymbalta

## 2022-01-06 ENCOUNTER — Telehealth: Payer: Self-pay | Admitting: Pharmacy Technician

## 2022-01-06 ENCOUNTER — Other Ambulatory Visit (INDEPENDENT_AMBULATORY_CARE_PROVIDER_SITE_OTHER): Payer: Medicare HMO

## 2022-01-06 DIAGNOSIS — M545 Low back pain, unspecified: Secondary | ICD-10-CM | POA: Diagnosis not present

## 2022-01-06 DIAGNOSIS — Z596 Low income: Secondary | ICD-10-CM

## 2022-01-06 DIAGNOSIS — R748 Abnormal levels of other serum enzymes: Secondary | ICD-10-CM

## 2022-01-06 LAB — HEPATIC FUNCTION PANEL
ALT: 12 U/L (ref 0–35)
AST: 12 U/L (ref 0–37)
Albumin: 4.1 g/dL (ref 3.5–5.2)
Alkaline Phosphatase: 116 U/L (ref 39–117)
Bilirubin, Direct: 0.1 mg/dL (ref 0.0–0.3)
Total Bilirubin: 0.3 mg/dL (ref 0.2–1.2)
Total Protein: 6.5 g/dL (ref 6.0–8.3)

## 2022-01-06 NOTE — Progress Notes (Addendum)
[place] [facility])                                            [place] Team    01/06/2022  ANJULIE DIPIERRO Nov 10, 1950 [id number]                                      Medication Assistance Referral-FOR 2024 RE ENROLLMENT  Referral From:  [facility]  Kristeen Miss  Medication/Company: [employer] / [employer] Patient application portion:  Mailed Provider application portion: Faxed  to Dr. Deborra Medina Provider address/fax verified via: Office website   Lis Savitt P. Molli Gethers, [place]  [phone]

## 2022-01-19 DIAGNOSIS — M25511 Pain in right shoulder: Secondary | ICD-10-CM | POA: Diagnosis not present

## 2022-01-20 DIAGNOSIS — M3501 Sicca syndrome with keratoconjunctivitis: Secondary | ICD-10-CM | POA: Diagnosis not present

## 2022-01-20 LAB — HM DIABETES EYE EXAM

## 2022-02-08 ENCOUNTER — Telehealth: Payer: Self-pay | Admitting: Pharmacy Technician

## 2022-02-08 DIAGNOSIS — Z596 Low income: Secondary | ICD-10-CM

## 2022-02-08 NOTE — Progress Notes (Addendum)
[place] [facility])                                            [place] Team   03/08/2022-ADDENDUM  Refaxed [employer] application as they informed they did not received the 02/08/2022 fax.  Mate Alegria P. Herrick Hartog, [place]  [phone]    02/08/2022  PRINCESS KARNES 07-21-1950 [id number]  Received both patient and provider portion(s) of patient assistance application(s) for Ozempic. Faxed completed application and required documents into [employer].    Tekia Waterbury P. Elli Groesbeck, [place]  [phone]

## 2022-02-21 ENCOUNTER — Telehealth: Payer: Self-pay

## 2022-02-21 NOTE — Telephone Encounter (Signed)
Spoke with pt to let her know that we have received her patient assistance Ozempic in the office and to clarify the dose she is currently taking. Pt is currently taking the 0.25 mg dose.   Ozempic: 3 boxes

## 2022-02-27 ENCOUNTER — Telehealth: Payer: Self-pay | Admitting: Internal Medicine

## 2022-02-27 NOTE — Telephone Encounter (Signed)
Placed in red forms folder

## 2022-02-27 NOTE — Telephone Encounter (Signed)
Patient dropped off a handicapp form to be sighed. Form is up front in Dr Lupita Dawn color folder.

## 2022-02-28 NOTE — Telephone Encounter (Signed)
Pt is aware and form has been placed up front for pick up.

## 2022-03-01 ENCOUNTER — Other Ambulatory Visit: Payer: Self-pay | Admitting: Internal Medicine

## 2022-03-02 DIAGNOSIS — M791 Myalgia, unspecified site: Secondary | ICD-10-CM | POA: Diagnosis not present

## 2022-03-02 DIAGNOSIS — M545 Low back pain, unspecified: Secondary | ICD-10-CM | POA: Diagnosis not present

## 2022-03-06 ENCOUNTER — Other Ambulatory Visit: Payer: Self-pay

## 2022-03-06 MED ORDER — ZOLPIDEM TARTRATE 10 MG PO TABS: 10.0000 mg | ORAL_TABLET | Freq: Every evening | ORAL | 5 refills | Status: DC | PRN

## 2022-03-06 NOTE — Telephone Encounter (Signed)
Refilled: 08/30/2021 Last OV: 01/02/2022 Next OV: not scheduled

## 2022-03-06 NOTE — Telephone Encounter (Signed)
Patient states she needs a refill for her zolpidem (AMBIEN) 10 MG tablet (Expired).  Patient states she started requesting this last Thursday from her pharmacy.  Patient states her pharmacy has reached out to [place] to request this refill.  Patient states she would really like to have this prescription today.  *Patient states [facility].  Patient states she called [place] six times over the weekend and she said the people she spoke with stated they tried to call her back but her number was blocked.  Patient states she has not blocked a number, so she did not understand why they were seeing it as blocked.  Patient states she felt like they gave her terrible service.  I let patient know that I will pass her message along.

## 2022-03-09 ENCOUNTER — Ambulatory Visit: Payer: Medicare HMO | Admitting: Internal Medicine

## 2022-03-10 ENCOUNTER — Telehealth: Payer: Self-pay

## 2022-03-10 ENCOUNTER — Encounter: Payer: Self-pay | Admitting: Internal Medicine

## 2022-03-10 ENCOUNTER — Ambulatory Visit (INDEPENDENT_AMBULATORY_CARE_PROVIDER_SITE_OTHER): Payer: Medicare HMO | Admitting: Internal Medicine

## 2022-03-10 VITALS — BP 128/76 | HR 90 | Temp 98.3°F | Ht 60.0 in | Wt 154.2 lb

## 2022-03-10 DIAGNOSIS — J069 Acute upper respiratory infection, unspecified: Secondary | ICD-10-CM

## 2022-03-10 DIAGNOSIS — R748 Abnormal levels of other serum enzymes: Secondary | ICD-10-CM

## 2022-03-10 DIAGNOSIS — I10 Essential (primary) hypertension: Secondary | ICD-10-CM | POA: Diagnosis not present

## 2022-03-10 DIAGNOSIS — J22 Unspecified acute lower respiratory infection: Secondary | ICD-10-CM

## 2022-03-10 DIAGNOSIS — E785 Hyperlipidemia, unspecified: Secondary | ICD-10-CM | POA: Diagnosis not present

## 2022-03-10 DIAGNOSIS — E1169 Type 2 diabetes mellitus with other specified complication: Secondary | ICD-10-CM | POA: Diagnosis not present

## 2022-03-10 DIAGNOSIS — D509 Iron deficiency anemia, unspecified: Secondary | ICD-10-CM | POA: Diagnosis not present

## 2022-03-10 LAB — CBC WITH DIFFERENTIAL/PLATELET
Basophils Absolute: 0 10*3/uL (ref 0.0–0.1)
Basophils Relative: 0.6 % (ref 0.0–3.0)
Eosinophils Absolute: 0.2 10*3/uL (ref 0.0–0.7)
Eosinophils Relative: 3 % (ref 0.0–5.0)
HCT: 35.4 % — ABNORMAL LOW (ref 36.0–46.0)
Hemoglobin: 11.5 g/dL — ABNORMAL LOW (ref 12.0–15.0)
Lymphocytes Relative: 35.1 % (ref 12.0–46.0)
Lymphs Abs: 2.7 10*3/uL (ref 0.7–4.0)
MCHC: 32.6 g/dL (ref 30.0–36.0)
MCV: 84.9 fl (ref 78.0–100.0)
Monocytes Absolute: 0.6 10*3/uL (ref 0.1–1.0)
Monocytes Relative: 7.5 % (ref 3.0–12.0)
Neutro Abs: 4.1 10*3/uL (ref 1.4–7.7)
Neutrophils Relative %: 53.8 % (ref 43.0–77.0)
Platelets: 386 10*3/uL (ref 150.0–400.0)
RBC: 4.17 Mil/uL (ref 3.87–5.11)
RDW: 14.2 % (ref 11.5–15.5)
WBC: 7.6 10*3/uL (ref 4.0–10.5)

## 2022-03-10 LAB — COMPREHENSIVE METABOLIC PANEL
ALT: 9 U/L (ref 0–35)
AST: 12 U/L (ref 0–37)
Albumin: 4.1 g/dL (ref 3.5–5.2)
Alkaline Phosphatase: 94 U/L (ref 39–117)
BUN: 12 mg/dL (ref 6–23)
CO2: 28 mEq/L (ref 19–32)
Calcium: 9.9 mg/dL (ref 8.4–10.5)
Chloride: 100 mEq/L (ref 96–112)
Creatinine, Ser: 0.68 mg/dL (ref 0.40–1.20)
GFR: 88.46 mL/min (ref >60.00)
Glucose, Bld: 140 mg/dL — ABNORMAL HIGH (ref 70–99)
Potassium: 4.6 mEq/L (ref 3.5–5.1)
Sodium: 137 mEq/L (ref 135–145)
Total Bilirubin: 0.3 mg/dL (ref 0.2–1.2)
Total Protein: 6.9 g/dL (ref 6.0–8.3)

## 2022-03-10 LAB — POCT INFLUENZA A/B

## 2022-03-10 LAB — POC COVID19 BINAXNOW

## 2022-03-10 MED ORDER — ALBUTEROL SULFATE HFA 108 (90 BASE) MCG/ACT IN AERS: 2.0000 | INHALATION_SPRAY | Freq: Four times a day (QID) | RESPIRATORY_TRACT | 11 refills | Status: DC | PRN

## 2022-03-10 MED ORDER — PREDNISONE 10 MG PO TABS: ORAL_TABLET | ORAL | 0 refills | Status: DC

## 2022-03-10 NOTE — Progress Notes (Unsigned)
Subjective:  Patient ID: Katherine Jimenez, female    DOB: 01/11/1951  Age: 71 y.o. MRN: [id number]  CC: The primary encounter diagnosis was Iron deficiency anemia, unspecified iron deficiency anemia type. Diagnoses of Hyperlipidemia associated with type 2 diabetes mellitus ([place]), Elevated liver enzymes, Lower respiratory infection, Viral URI with cough, and Primary hypertension were also pertinent to this visit.   HPI Katherine Jimenez presents for TREATMENT OF RESPIRATORY INFECTION  Chief Complaint  Patient presents with   Acute Visit    Chest congestion     1 week history of body aches,  cough, malaise.  COVID TEST WAS NEATIVE 6 days ago, has not retested. Started with sore throat, fatigue,  followed by productive cough . Denies sinus symptoms. The cough has not been paroxysmal.  Taking tylenol liquid and benadryl at night .  Some dyspnea with exertion with wheezing with supine position  2) HTN: reading elevated today but most recent home reading was 124/76 lst week prior to onset of cold symptoms.  Taking losartan 50 mg daily    Outpatient Medications Prior to Visit  Medication Sig Dispense Refill   acetaminophen (TYLENOL) 325 MG tablet Take 650 mg by mouth every 6 (six) hours as needed.     aspirin EC 81 MG tablet Take 81 mg by mouth daily. Swallow whole.     baclofen (LIORESAL) 10 MG tablet Take 10 mg by mouth 3 (three) times daily.     Biotin 5 MG CAPS Take 2 capsules by mouth 2 (two) times daily.      blood glucose meter kit and supplies Dispense Contour next bayer meter. E11.9 To check blood glucose  Once a day. 1 each 0   busPIRone (BUSPAR) 10 MG tablet Take 1 tablet (10 mg total) by mouth 3 (three) times daily. 90 tablet 2   Cholecalciferol (VITAMIN D3) 1000 units CAPS Take 1,000 Units by mouth daily.      cyanocobalamin (VITAMIN B12) 1000 MCG/ML injection Inject 1 mL (1,000 mcg total) into the muscle every 30 (thirty) days. 3 mL 4   donepezil (ARICEPT) 10 MG tablet Take 10 mg  by mouth daily.     DULoxetine (CYMBALTA) 60 MG capsule Take 1 capsule (60 mg total) by mouth daily. 30 capsule 2   gabapentin (NEURONTIN) 100 MG capsule Take 1 capsule (100 mg total) by mouth 3 (three) times daily. 90 capsule 3   glucose blood (FREESTYLE LITE) test strip Use once daily  as instructed to test blood sugar E 11.9 100 each 3   lamoTRIgine (LAMICTAL) 200 MG tablet TAKE ONE TABLET BY MOUTH TWICE A DAY 180 tablet 1   Lancets (FREESTYLE) lancets Use once daily to check blood sugars  E11.9 100 each 3   losartan (COZAAR) 50 MG tablet Take 1 tablet (50 mg total) by mouth daily. 90 tablet 3   naproxen sodium (ALEVE) 220 MG tablet Take 220 mg by mouth daily as needed.     nitroGLYCERIN (NITROSTAT) 0.4 MG SL tablet Place 1 tablet (0.4 mg total) under the tongue every 5 (five) minutes as needed for chest pain. 50 tablet 3   omeprazole (PRILOSEC) 40 MG capsule TAKE ONE CAPSULE BY MOUTH DAILY 90 capsule 3   Semaglutide,0.25 or 0.5MG/DOS, (OZEMPIC, 0.25 OR 0.5 MG/DOSE,) 2 MG/1.5ML SOPN Inject 0.5 mg into the skin once a week. Inject 0.25 mg weekly for 4 weeks then increase to 0.5 mg weekly 1.5 mL 0   Syringe, Disposable, 1 ML MISC For use  with B12 injections 25 each 3   tretinoin (RETIN-A) 0.05 % cream Apply pea-sized amount to face at bedtime, wash off in morning. 20 g 3   zolpidem (AMBIEN) 10 MG tablet Take 1 tablet (10 mg total) by mouth at bedtime as needed for sleep. 30 tablet 5   donepezil (ARICEPT) 5 MG tablet Take 5 mg by mouth daily.     No facility-administered medications prior to visit.    Review of Systems;  Patient denies headache, fevers, malaise, unintentional weight loss, skin rash, eye pain, sinus congestion and sinus pain, sore throat, dysphagia,  hemoptysis , cough, dyspnea, wheezing, chest pain, palpitations, orthopnea, edema, abdominal pain, nausea, melena, diarrhea, constipation, flank pain, dysuria, hematuria, urinary  Frequency, nocturia, numbness, tingling, seizures,   Focal weakness, Loss of consciousness,  Tremor, insomnia, depression, anxiety, and suicidal ideation.      Objective:  BP 128/76   Pulse 90   Temp 98.3 F (36.8 C) (Oral)   Ht 5' (1.524 m)   Wt 154 lb 3.2 oz (69.9 kg)   SpO2 95%   BMI 30.12 kg/m   BP Readings from Last 3 Encounters:  03/10/22 128/76  12/22/21 (!) 148/85  12/16/21 120/66    Wt Readings from Last 3 Encounters:  03/10/22 154 lb 3.2 oz (69.9 kg)  01/02/22 153 lb (69.4 kg)  12/22/21 153 lb (69.4 kg)    General appearance: alert, cooperative and appears stated age Ears: normal TM's and external ear canals both ears Throat: lips, mucosa, and tongue normal; teeth and gums normal Neck: no adenopathy, no carotid bruit, supple, symmetrical, trachea midline and thyroid not enlarged, symmetric, no tenderness/mass/nodules Back: symmetric, no curvature. ROM normal. No CVA tenderness. Lungs: clear to auscultation bilaterally Heart: regular rate and rhythm, S1, S2 normal, no murmur, click, rub or gallop Abdomen: soft, non-tender; bowel sounds normal; no masses,  no organomegaly Pulses: 2+ and symmetric Skin: Skin color, texture, turgor normal. No rashes or lesions Lymph nodes: Cervical, supraclavicular, and axillary nodes normal. Neuro:  awake and interactive with normal mood and affect. Higher cortical functions are normal. Speech is clear without word-finding difficulty or dysarthria. Extraocular movements are intact. Visual fields of both eyes are grossly intact. Sensation to light touch is grossly intact bilaterally of upper and lower extremities. Motor examination shows 4+/5 symmetric hand grip and upper extremity and 5/5 lower extremity strength. There is no pronation or drift. Gait is non-ataxic   Lab Results  Component Value Date   HGBA1C 6.3 12/16/2021   HGBA1C 6.2 07/04/2021   HGBA1C 6.0 (H) 03/22/2021    Lab Results  Component Value Date   CREATININE 0.83 12/16/2021   CREATININE 0.74 08/06/2021    CREATININE 0.77 07/21/2021    Lab Results  Component Value Date   WBC 6.5 12/16/2021   HGB 12.0 12/16/2021   HCT 38.1 12/16/2021   PLT 298.0 12/16/2021   GLUCOSE 112 (H) 12/16/2021   CHOL 149 05/12/2021   TRIG 119.0 05/12/2021   HDL 86.20 05/12/2021   LDLDIRECT 112.0 10/13/2016   LDLCALC 39 05/12/2021   ALT 12 01/06/2022   AST 12 01/06/2022   NA 138 12/16/2021   K 4.8 12/16/2021   CL 101 12/16/2021   CREATININE 0.83 12/16/2021   BUN 19 12/16/2021   CO2 29 12/16/2021   TSH 0.671 08/06/2021   INR 1.0 03/21/2021   HGBA1C 6.3 12/16/2021   MICROALBUR 1.0 07/04/2021    No results found.  Assessment & Plan:   Problem List Items  Addressed This Visit     Viral URI with cough    Complicated by wheezing.  Prednisone taper and albuterol MDI. Prescribed.  COVID and INFLUENZA A/B  POC negative       Iron deficiency anemia - Primary   Relevant Orders   CBC with Differential/Platelet   Hypertension    she reports compliance with medication regimen  but has an elevated reading today in office.  She is not using NSAIDs daily.  Discussed goal of 120/70  (130/80 for patients over 70)  to preserve renal function.  She has been asked to check her  BP  at home and  submit readings for evaluation. Renal function, electrolytes and screen for proteinuria are all normal .      Hyperlipidemia associated with type 2 diabetes mellitus (HCC)   Elevated liver enzymes    Presumed secondary to statin and/or NSAID  noninvasive workup reviewed only EBV IGG was positive.  Rechecking today       Relevant Orders   Comprehensive metabolic panel   Other Visit Diagnoses     Lower respiratory infection       Relevant Orders   POC COVID-19 (Completed)   POCT Influenza A/B (Completed)      Follow-up: No follow-ups on file.   Crecencio Mc, MD

## 2022-03-10 NOTE — Assessment & Plan Note (Signed)
Complicated by wheezing.  Prednisone taper and albuterol MDI. Prescribed.  COVID and INFLUENZA A/B  POC negative

## 2022-03-10 NOTE — Patient Instructions (Signed)
You have a viral infection complicated by bronchitis.  I am prescribing a   Prednisone tapering dose x 6 days Albuterol MDI use 2 puffs every 6 hours as needed for wheezing   I also advise use of the following OTC meds to help with your other symptoms.   Continue benadryl at night  for the cough  Use delsym during the day if needed for cough    Call the office if you develop fevers  (t > 100.4) ,  greenish/brown sputum or nasal discharge .

## 2022-03-10 NOTE — Assessment & Plan Note (Signed)
she reports compliance with medication regimen  but has an elevated reading today in office.  She is not using NSAIDs daily.  Discussed goal of 120/70  (130/80 for patients over 70)  to preserve renal function.  She has been asked to check her  BP  at home and  submit readings for evaluation. Renal function, electrolytes and screen for proteinuria are all normal . 

## 2022-03-10 NOTE — Assessment & Plan Note (Signed)
Presumed secondary to statin and/or NSAID  noninvasive workup reviewed only EBV IGG was positive.  Rechecking today

## 2022-03-14 NOTE — Progress Notes (Signed)
This encounter was created in error - please disregard.

## 2022-03-15 NOTE — Telephone Encounter (Signed)
MyChart messgae sent to patient. 

## 2022-03-16 ENCOUNTER — Other Ambulatory Visit: Payer: Self-pay | Admitting: Internal Medicine

## 2022-03-25 ENCOUNTER — Other Ambulatory Visit: Payer: Self-pay | Admitting: Internal Medicine

## 2022-03-30 ENCOUNTER — Telehealth: Payer: Self-pay

## 2022-03-30 NOTE — Telephone Encounter (Signed)
I called and LV informing the patient to pickup her Ozempic fro patient assistance.  Tya Haughey,cma

## 2022-04-07 ENCOUNTER — Telehealth: Payer: Self-pay | Admitting: Pharmacy Technician

## 2022-04-07 DIAGNOSIS — Z596 Low income: Secondary | ICD-10-CM

## 2022-04-07 NOTE — Progress Notes (Signed)
[facility])                                            Central Team    04/07/2022  Katherine Jimenez 1950-12-19 [id number]  Care coordination call placed to [place] in regard to [place] application.  Spoke to [place] who informs patient is APPROVED 04/03/22-04/03/23. Medication will auto ship to prescriber's office based off last refill in 2023.  Katherine Jimenez, [name]  [phone]

## 2022-04-10 NOTE — Telephone Encounter (Signed)
Pt has picked up medication.  

## 2022-04-27 ENCOUNTER — Ambulatory Visit: Payer: Medicare HMO | Admitting: Gastroenterology

## 2022-05-04 ENCOUNTER — Telehealth: Payer: Self-pay

## 2022-05-04 NOTE — Telephone Encounter (Signed)
Called pt to let her know that Ozempic is ready to be picked from patient assistance fund.  Pt reported that she would come to pick up in the "next day or so."

## 2022-05-29 ENCOUNTER — Telehealth: Payer: Self-pay | Admitting: Internal Medicine

## 2022-05-29 NOTE — Telephone Encounter (Signed)
Pt called in staying that she would like to see Dr.Tullo anytime this week, as per pt, she's having anxiety, depression, and would like to have some meds regarding this matter. I offered Friday 3/1, per pt its too far out, however, pt stated that she will call back in case she wants to come in Friday.

## 2022-05-29 NOTE — Telephone Encounter (Signed)
Pt sched 2/27

## 2022-05-30 ENCOUNTER — Encounter: Payer: Self-pay | Admitting: Internal Medicine

## 2022-05-30 ENCOUNTER — Ambulatory Visit (INDEPENDENT_AMBULATORY_CARE_PROVIDER_SITE_OTHER): Payer: Medicare HMO | Admitting: Internal Medicine

## 2022-05-30 ENCOUNTER — Telehealth: Payer: Self-pay | Admitting: *Deleted

## 2022-05-30 VITALS — BP 118/60 | HR 109 | Temp 98.8°F | Ht 60.0 in | Wt 143.0 lb

## 2022-05-30 DIAGNOSIS — I700 Atherosclerosis of aorta: Secondary | ICD-10-CM

## 2022-05-30 DIAGNOSIS — R69 Illness, unspecified: Secondary | ICD-10-CM | POA: Diagnosis not present

## 2022-05-30 DIAGNOSIS — F3131 Bipolar disorder, current episode depressed, mild: Secondary | ICD-10-CM

## 2022-05-30 DIAGNOSIS — E1169 Type 2 diabetes mellitus with other specified complication: Secondary | ICD-10-CM | POA: Diagnosis not present

## 2022-05-30 DIAGNOSIS — G720 Drug-induced myopathy: Secondary | ICD-10-CM | POA: Diagnosis not present

## 2022-05-30 DIAGNOSIS — E785 Hyperlipidemia, unspecified: Secondary | ICD-10-CM | POA: Diagnosis not present

## 2022-05-30 DIAGNOSIS — F01A4 Vascular dementia, mild, with anxiety: Secondary | ICD-10-CM | POA: Diagnosis not present

## 2022-05-30 DIAGNOSIS — R748 Abnormal levels of other serum enzymes: Secondary | ICD-10-CM

## 2022-05-30 DIAGNOSIS — T466X5A Adverse effect of antihyperlipidemic and antiarteriosclerotic drugs, initial encounter: Secondary | ICD-10-CM

## 2022-05-30 LAB — HEMOGLOBIN A1C: Hgb A1c MFr Bld: 6 % (ref 4.6–6.5)

## 2022-05-30 MED ORDER — BUSPIRONE HCL 30 MG PO TABS: 30.0000 mg | ORAL_TABLET | Freq: Two times a day (BID) | ORAL | 2 refills | Status: DC
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
## 2022-05-30 NOTE — Patient Instructions (Addendum)
Increase Buspar to 30 mg  twice dail (you can reduce the morning dose if you don't need 30 mg)  Continue lamictal,  cymbalta,  and DAILY EXERCISE   [facility] REFERRAL IN PROGRESS FOR PSYCHIATRY   I WILL MAKE THE REFERRAL TO [facility],

## 2022-05-30 NOTE — Assessment & Plan Note (Signed)
Managed for > 15 yrs with lamictal.  Recent financia stressors and finalization of divorced have trigger panic attacks and depression.  Continue cybalta 60 mg daily and increase buspar to 30 mg bid.  Psychology referral made.  [facility] referral made

## 2022-05-30 NOTE — Assessment & Plan Note (Signed)
Resolved,  Presumed secondary to statin and/or NSAID  noninvasive workup reviewed only EBV IGG was positive.   Lab Results  Component Value Date   ALT 9 03/10/2022   AST 12 03/10/2022   ALKPHOS 94 03/10/2022   BILITOT 0.3 03/10/2022

## 2022-05-30 NOTE — Progress Notes (Signed)
  Care Coordination  Outreach Note  05/30/2022 Name: Katherine Jimenez MRN: [id number] DOB: 01/23/1951   Care Coordination Outreach Attempts: An unsuccessful telephone outreach was attempted today to offer the patient information about available care coordination services as a benefit of their health plan.   Referral received   Follow Up Plan:  Additional outreach attempts will be made to offer the patient care coordination information and services.   Encounter Outcome:  No Answer  Julian Hy, [place] Direct Dial: [phone]

## 2022-05-30 NOTE — Assessment & Plan Note (Signed)
Untreated due to  Trial of  atorvastatin causing  liver enzyme elevatiion .

## 2022-05-30 NOTE — Progress Notes (Signed)
  Care Coordination   Note   05/30/2022 Name: DAISIA TUNNICLIFF MRN: [id number] DOB: [birth year]  Deborra Medina is a 72 y.o. year old female who sees Derrel Nip, Aris Everts, MD for primary care. I reached out to [employer] by phone today to offer care coordination services.  Ms. Beilman was given information about Care Coordination services today including:   The Care Coordination services include support from the care team which includes your Nurse Coordinator, Clinical Social Worker, or Pharmacist.  The Care Coordination team is here to help remove barriers to the health concerns and goals most important to you. Care Coordination services are voluntary, and the patient may decline or stop services at any time by request to their care team member.   Care Coordination Consent Status: Patient agreed to services and verbal consent obtained.   Follow up plan:  Telephone appointment with care coordination team member scheduled for:  06/07/2022  Encounter Outcome:  Pt. Scheduled from referral   Julian Hy, [place] Direct Dial: [phone]

## 2022-05-30 NOTE — Assessment & Plan Note (Signed)
Cognitive deficits have not progressed.  She has  stopped Namenda and resumed aricept 10 mg

## 2022-06-07 ENCOUNTER — Encounter: Payer: Self-pay | Admitting: *Deleted

## 2022-06-07 ENCOUNTER — Telehealth: Payer: Self-pay | Admitting: *Deleted

## 2022-06-07 ENCOUNTER — Ambulatory Visit: Payer: Self-pay | Admitting: *Deleted

## 2022-06-07 NOTE — Telephone Encounter (Signed)
This encounter was created in error - please disregard.

## 2022-06-07 NOTE — Patient Outreach (Signed)
  Care Coordination   Initial Visit Note   06/07/2022 Name: Katherine Jimenez MRN: [id number] DOB: 09/10/1950  Deborra Medina is a 72 y.o. year old female who sees Derrel Nip, Aris Everts, MD for primary care. I spoke with  Deborra Medina by phone today.  What matters to the patients health and wellness today?  Mental Health Follow up    Goals Addressed             This Visit's Progress    Identify mental health resources       Care Coordination Interventions: Discussed in-network mental health providers-contact information provided Patient agreed to contact mental health provider by next visit with CSW Patient agreeable to start [place] at her church Patient agreeable to increasing swim workouts at the [facility] si 4x per week PHQ2/PHQ9 completed Solution-Focused Strategies employed:  Active listening / Reflection utilized  Emotional Support Provided         SDOH assessments and interventions completed:  Yes  SDOH Interventions Today    Flowsheet Row Most Recent Value  SDOH Interventions   Food Insecurity Interventions Intervention Not Indicated  Housing Interventions Intervention Not Indicated  Utilities Interventions Intervention Not Indicated        Care Coordination Interventions:  Yes, provided  Interventions Today    Flowsheet Row Most Recent Value  Chronic Disease   Chronic disease during today's visit Diabetes, Hypertension (HTN)  General Interventions   General Interventions Discussed/Reviewed General Interventions Discussed, Community Resources  Exercise Interventions   Exercise Discussed/Reviewed Exercise Discussed, Physical Activity  [patient swims at the [employer] 4x per week]  Physical Activity Discussed/Reviewed Physical Activity Discussed, Types of exercise, Gym  [patient swims at the [facility] 4x per week]  Education Interventions   Education Provided Provided Education  Provided Verbal Education On [place] Discussed/Reviewed Mental Health Discussed, Coping Strategies, Anxiety, Depression  [Encouraged participation in mental health counseling and divorcecare through her church]  Safety Interventions   Safety Discussed/Reviewed Safety Discussed  [patient enocuraged to contact [phone] in the event of a mental health crisis]       Follow up plan: Follow up call scheduled for 06/15/22    Encounter Outcome:  Pt. Visit Completed

## 2022-06-07 NOTE — Patient Outreach (Signed)
  Care Coordination   06/07/2022 Name: Katherine Jimenez MRN: [id number] DOB: 20-Jan-1951   Care Coordination Outreach Attempts:  An unsuccessful telephone outreach was attempted for a scheduled appointment today.  Follow Up Plan:  Additional outreach attempts will be made to offer the patient care coordination information and services.   Encounter Outcome:  No Answer   Care Coordination Interventions:  No, not indicated    Cobe Viney, [place] Worker  [facility] Care Management [phone]

## 2022-06-07 NOTE — Patient Instructions (Signed)
Visit Information  Thank you for taking time to visit with me today. Please don't hesitate to contact me if I can be of assistance to you.   Following are the goals we discussed today:   Goals Addressed             This Visit's Progress    Identify mental health resources       Care Coordination Interventions: Discussed in-network mental health providers-contact information provided Patient agreed to contact mental health provider by next visit with CSW Patient agreeable to start [place] at her church Patient agreeable to increasing swim workouts at the [facility] si 4x per week PHQ2/PHQ9 completed Solution-Focused Strategies employed:  Active listening / Reflection utilized  Emotional Support Provided         Our next appointment is by telephone on 06/14/21 at 2pm  Please call the care guide team at [phone] if you need to cancel or reschedule your appointment.   If you are experiencing a Mental Health or [place] or need someone to talk to, please call the Suicide and Crisis Lifeline: 988   Patient verbalizes understanding of instructions and care plan provided today and agrees to view in [place]. Active MyChart status and patient understanding of how to access instructions and care plan via MyChart confirmed with patient.     Telephone follow up appointment with care management team member scheduled for: 06/15/22  Elliot Gurney, [place] Worker  [facility] Care Management [phone]

## 2022-06-08 ENCOUNTER — Other Ambulatory Visit: Payer: Self-pay | Admitting: Internal Medicine

## 2022-06-09 DIAGNOSIS — R69 Illness, unspecified: Secondary | ICD-10-CM | POA: Diagnosis not present

## 2022-06-09 DIAGNOSIS — F319 Bipolar disorder, unspecified: Secondary | ICD-10-CM | POA: Diagnosis not present

## 2022-06-09 DIAGNOSIS — F4323 Adjustment disorder with mixed anxiety and depressed mood: Secondary | ICD-10-CM | POA: Diagnosis not present

## 2022-06-15 ENCOUNTER — Telehealth: Payer: Self-pay | Admitting: *Deleted

## 2022-06-15 ENCOUNTER — Encounter: Payer: Self-pay | Admitting: Internal Medicine

## 2022-06-15 ENCOUNTER — Ambulatory Visit: Payer: Self-pay | Admitting: *Deleted

## 2022-06-15 NOTE — Patient Outreach (Signed)
  Care Coordination   Follow Up Visit Note   06/15/2022 Name: Katherine Jimenez MRN: [id number] DOB: June 28, 1950  Katherine Jimenez is a 72 y.o. year old female who sees Derrel Nip, Aris Everts, MD for primary care. I spoke with  Katherine Jimenez by phone today.  What matters to the patients health and wellness today?  Mental Health Follow up    Goals Addressed             This Visit's Progress    Identify mental health resources       Activities and task to complete in order to accomplish goals.   Discussed in-network mental health provider follow up-patient will switch providers has another mental health provider that she would like to call and follow up with  Patient agreeable to start [place] support group Patient agreeable to increasing swim workouts at the [facility] is 4x per week         SDOH assessments and interventions completed:  No     Care Coordination Interventions:  Yes, provided  Interventions Today    Flowsheet Row Most Recent Value  General Interventions   General Interventions Discussed/Reviewed General Interventions Reviewed, Merchant navy officer health resources discussed]  Exercise Interventions   Exercise Discussed/Reviewed Exercise Reviewed, Physical Activity  [Patient confirms swimming at the [facility] 3x this week]  Physical Activity Discussed/Reviewed Types of exercise, Physical Activity Reviewed, Gym  [swimming 3x per week]  [place], Anxiety, Depression  [Patient would like to switch mental health agencies, has a name of a psychologist that she will call to follow up with]       Follow up plan: No further intervention required.   Encounter Outcome:  Pt. Visit Completed

## 2022-06-15 NOTE — Patient Outreach (Signed)
  Care Coordination   06/15/2022 Name: JAMYIA FORTUNE MRN: [id number] DOB: 1950/07/11   Care Coordination Outreach Attempts:  An unsuccessful telephone outreach was attempted for a scheduled appointment today.  Follow Up Plan:  Additional outreach attempts will be made to offer the patient care coordination information and services.   Encounter Outcome:  No Answer   Care Coordination Interventions:  No, not indicated    Michalle Rademaker, [place] Worker  [facility] Care Management [phone]

## 2022-06-15 NOTE — Patient Instructions (Signed)
Visit Information  Thank you for taking time to visit with me today. Please don't hesitate to contact me if I can be of assistance to you.   Following are the goals we discussed today:   Goals Addressed             This Visit's Progress    Identify mental health resources       Activities and task to complete in order to accomplish goals.   Discussed in-network mental health provider follow up-patient will switch providers has another mental health provider that she would like to call and follow up with  Patient agreeable to start [place] support group Patient agreeable to increasing swim workouts at the [facility] is 4x per week        If you are experiencing a Mental Health or [place] or need someone to talk to, please call the Suicide and Crisis Lifeline: 988   Patient verbalizes understanding of instructions and care plan provided today and agrees to view in [place]. Active MyChart status and patient understanding of how to access instructions and care plan via MyChart confirmed with patient.     No further follow up required: patient to follow up with mental health provider of choice  [place], [place] Worker  [facility] Care Management [phone]

## 2022-06-16 ENCOUNTER — Other Ambulatory Visit: Payer: Self-pay | Admitting: Internal Medicine

## 2022-06-23 ENCOUNTER — Other Ambulatory Visit: Payer: Self-pay | Admitting: Internal Medicine

## 2022-06-23 DIAGNOSIS — Z1231 Encounter for screening mammogram for malignant neoplasm of breast: Secondary | ICD-10-CM

## 2022-07-14 ENCOUNTER — Ambulatory Visit
Admission: RE | Admit: 2022-07-14 | Discharge: 2022-07-14 | Disposition: A | Discharge status: Home / Self Care | Payer: Medicare HMO | Source: Ambulatory Visit | Attending: Internal Medicine | Admitting: Internal Medicine

## 2022-07-14 DIAGNOSIS — Z1231 Encounter for screening mammogram for malignant neoplasm of breast: Secondary | ICD-10-CM | POA: Diagnosis not present

## 2022-07-18 ENCOUNTER — Encounter: Payer: Self-pay | Admitting: Internal Medicine

## 2022-07-18 ENCOUNTER — Ambulatory Visit (INDEPENDENT_AMBULATORY_CARE_PROVIDER_SITE_OTHER): Payer: Medicare HMO | Admitting: Internal Medicine

## 2022-07-18 VITALS — BP 126/74 | HR 95 | Temp 98.5°F | Ht 60.0 in | Wt 141.2 lb

## 2022-07-18 DIAGNOSIS — E785 Hyperlipidemia, unspecified: Secondary | ICD-10-CM

## 2022-07-18 DIAGNOSIS — M48061 Spinal stenosis, lumbar region without neurogenic claudication: Secondary | ICD-10-CM

## 2022-07-18 DIAGNOSIS — E1169 Type 2 diabetes mellitus with other specified complication: Secondary | ICD-10-CM | POA: Diagnosis not present

## 2022-07-18 DIAGNOSIS — D509 Iron deficiency anemia, unspecified: Secondary | ICD-10-CM | POA: Diagnosis not present

## 2022-07-18 DIAGNOSIS — R69 Illness, unspecified: Secondary | ICD-10-CM | POA: Diagnosis not present

## 2022-07-18 DIAGNOSIS — E118 Type 2 diabetes mellitus with unspecified complications: Secondary | ICD-10-CM | POA: Diagnosis not present

## 2022-07-18 DIAGNOSIS — T466X5A Adverse effect of antihyperlipidemic and antiarteriosclerotic drugs, initial encounter: Secondary | ICD-10-CM

## 2022-07-18 DIAGNOSIS — M79672 Pain in left foot: Secondary | ICD-10-CM

## 2022-07-18 DIAGNOSIS — M791 Myalgia, unspecified site: Secondary | ICD-10-CM

## 2022-07-18 DIAGNOSIS — F3131 Bipolar disorder, current episode depressed, mild: Secondary | ICD-10-CM

## 2022-07-18 DIAGNOSIS — I10 Essential (primary) hypertension: Secondary | ICD-10-CM

## 2022-07-18 DIAGNOSIS — I2584 Coronary atherosclerosis due to calcified coronary lesion: Secondary | ICD-10-CM

## 2022-07-18 DIAGNOSIS — E119 Type 2 diabetes mellitus without complications: Secondary | ICD-10-CM

## 2022-07-18 MED ORDER — BUSPIRONE HCL 30 MG PO TABS: 30.0000 mg | ORAL_TABLET | Freq: Two times a day (BID) | ORAL | 2 refills | Status: DC

## 2022-07-18 MED ORDER — DULOXETINE HCL 60 MG PO CPEP: 60.0000 mg | ORAL_CAPSULE | Freq: Every day | ORAL | 1 refills | Status: DC

## 2022-07-18 MED ORDER — LOSARTAN POTASSIUM 50 MG PO TABS: 50.0000 mg | ORAL_TABLET | Freq: Every day | ORAL | 0 refills | Status: DC

## 2022-07-18 MED ORDER — CYANOCOBALAMIN 1000 MCG/ML IJ SOLN: 1000.0000 ug | INTRAMUSCULAR | 4 refills | Status: DC

## 2022-07-18 NOTE — Progress Notes (Signed)
Subjective:  Patient ID: Katherine Jimenez, female    DOB: 10-Dec-1950  Age: 72 y.o. MRN: [id number]  CC: The primary encounter diagnosis was DM type 2, controlled, with complication. Diagnoses of Hyperlipidemia associated with type 2 diabetes mellitus, Iron deficiency anemia, unspecified iron deficiency anemia type, Diabetes mellitus without complication, Coronary atherosclerosis due to calcified coronary lesion (CODE), Degenerative lumbar spinal stenosis, Myalgia due to statin, Pain of left midfoot, Primary hypertension, and Bipolar affective disorder, currently depressed, mild were also pertinent to this visit.   HPI ADONAI HELZER presents for  Chief Complaint  Patient presents with   Medical Management of Chronic Issues    4 week follow up    1) Bipolar disorder complicated by financial stressors causing uncontrolled anxiety;  seen one month ago.   buspar increased to 30 mg bid and cymbalta continued at 60 mg daily along with lamictal 200 mg twice daily . Psychiatry referral made but no relationship established due to 1) copay with dr Maryruth Bun and 2) dissatisfaction with 2nd choice. She feels generally better, however with regular participation in group exercise and denies any manic symptoms .  Sleeping well   2) USING GLUCOSAMINE CHONDROITIN SULFATE AND BACK AND FOOT PAIN IMPROVED     3) HTN:  Patient is taking losartan  as prescribed and notes no adverse effects.  Home BP readings have been done about once per week and are  generally < 130/80 .  She is avoiding added salt in her diet and swimming 6 days per  week for exercise  .   4) Type 2 DM:   He feels generally well, is exercising several times per week and checking blood sugars once daily at variable times.  BS have been under 130 fasting and < 150 post prandially.  Denies any recent hypoglyemic events.  Taking his medications as directed. Following a carbohydrate modified diet 6 days per week. Denies numbness, burning and tingling of  extremities. Appetite is good.      Outpatient Medications Prior to Visit  Medication Sig Dispense Refill   acetaminophen (TYLENOL) 325 MG tablet Take 650 mg by mouth every 6 (six) hours as needed.     aspirin EC 81 MG tablet Take 81 mg by mouth daily. Swallow whole.     Biotin 5 MG CAPS Take 2 capsules by mouth 2 (two) times daily.      blood glucose meter kit and supplies Dispense Contour next bayer meter. E11.9 To check blood glucose  Once a day. 1 each 0   Cholecalciferol (VITAMIN D3) 1000 units CAPS Take 1,000 Units by mouth daily.      donepezil (ARICEPT) 10 MG tablet Take 5 mg by mouth daily.     glucose blood (FREESTYLE LITE) test strip Use once daily  as instructed to test blood sugar E 11.9 100 each 3   lamoTRIgine (LAMICTAL) 200 MG tablet TAKE 1 TABLET BY MOUTH TWICE A DAY 180 tablet 1   Lancets (FREESTYLE) lancets Use once daily to check blood sugars  E11.9 100 each 3   naproxen sodium (ALEVE) 220 MG tablet Take 220 mg by mouth daily as needed.     nitroGLYCERIN (NITROSTAT) 0.4 MG SL tablet Place 1 tablet (0.4 mg total) under the tongue every 5 (five) minutes as needed for chest pain. 50 tablet 3   omeprazole (PRILOSEC) 40 MG capsule TAKE ONE CAPSULE BY MOUTH DAILY 90 capsule 3   Semaglutide,0.25 or 0.5MG /DOS, (OZEMPIC, 0.25 OR 0.5 MG/DOSE,) 2 MG/1.5ML  SOPN Inject 0.5 mg into the skin once a week. Inject 0.25 mg weekly for 4 weeks then increase to 0.5 mg weekly 1.5 mL 0   Syringe, Disposable, 1 ML MISC For use with B12 injections 25 each 3   tretinoin (RETIN-A) 0.05 % cream Apply pea-sized amount to face at bedtime, wash off in morning. 20 g 3   busPIRone (BUSPAR) 10 MG tablet TAKE ONE TABLET BY MOUTH THREE TIMES A DAY 90 tablet 2   busPIRone (BUSPAR) 30 MG tablet Take 1 tablet (30 mg total) by mouth in the morning and at bedtime. 60 tablet 2   cyanocobalamin (VITAMIN B12) 1000 MCG/ML injection Inject 1 mL (1,000 mcg total) into the muscle every 30 (thirty) days. 3 mL 4    DULoxetine (CYMBALTA) 60 MG capsule TAKE 1 CAPSULE BY MOUTH DAILY 90 capsule 1   losartan (COZAAR) 50 MG tablet TAKE ONE TABLET BY MOUTH DAILY 90 tablet 0   zolpidem (AMBIEN) 10 MG tablet Take 1 tablet (10 mg total) by mouth at bedtime as needed for sleep. 30 tablet 5   No facility-administered medications prior to visit.    Review of Systems;  Patient denies headache, fevers, malaise, unintentional weight loss, skin rash, eye pain, sinus congestion and sinus pain, sore throat, dysphagia,  hemoptysis , cough, dyspnea, wheezing, chest pain, palpitations, orthopnea, edema, abdominal pain, nausea, melena, diarrhea, constipation, flank pain, dysuria, hematuria, urinary  Frequency, nocturia, numbness, tingling, seizures,  Focal weakness, Loss of consciousness,  Tremor, insomnia, depression, anxiety, and suicidal ideation.      Objective:  BP 126/74   Pulse 95   Temp 98.5 F (36.9 C) (Oral)   Ht 5' (1.524 m)   Wt 141 lb 3.2 oz (64 kg)   SpO2 96%   BMI 27.58 kg/m   BP Readings from Last 3 Encounters:  07/18/22 126/74  05/30/22 118/60  03/10/22 128/76    Wt Readings from Last 3 Encounters:  07/18/22 141 lb 3.2 oz (64 kg)  05/30/22 143 lb (64.9 kg)  03/10/22 154 lb 3.2 oz (69.9 kg)    Physical Exam  Lab Results  Component Value Date   HGBA1C 6.0 05/30/2022   HGBA1C 6.3 12/16/2021   HGBA1C 6.2 07/04/2021    Lab Results  Component Value Date   CREATININE 0.68 03/10/2022   CREATININE 0.83 12/16/2021   CREATININE 0.74 08/06/2021    Lab Results  Component Value Date   WBC 7.6 03/10/2022   HGB 11.5 (L) 03/10/2022   HCT 35.4 (L) 03/10/2022   PLT 386.0 03/10/2022   GLUCOSE 140 (H) 03/10/2022   CHOL 149 05/12/2021   TRIG 119.0 05/12/2021   HDL 86.20 05/12/2021   LDLDIRECT 112.0 10/13/2016   LDLCALC 39 05/12/2021   ALT 9 03/10/2022   AST 12 03/10/2022   NA 137 03/10/2022   K 4.6 03/10/2022   CL 100 03/10/2022   CREATININE 0.68 03/10/2022   BUN 12 03/10/2022   CO2  28 03/10/2022   TSH 0.671 08/06/2021   INR 1.0 03/21/2021   HGBA1C 6.0 05/30/2022   MICROALBUR 1.0 07/04/2021    MM 3D SCREENING MAMMOGRAM BILATERAL BREAST  Result Date: 07/17/2022 CLINICAL DATA:  Screening. EXAM: DIGITAL SCREENING BILATERAL MAMMOGRAM WITH TOMOSYNTHESIS AND CAD TECHNIQUE: Bilateral screening digital craniocaudal and mediolateral oblique mammograms were obtained. Bilateral screening digital breast tomosynthesis was performed. The images were evaluated with computer-aided detection. COMPARISON:  Previous exam(s). ACR Breast Density Category c: The breasts are heterogeneously dense, which may obscure small masses.  FINDINGS: There are no findings suspicious for malignancy. IMPRESSION: No mammographic evidence of malignancy. A result letter of this screening mammogram will be mailed directly to the patient. RECOMMENDATION: Screening mammogram in one year. (Code:SM-B-01Y) BI-RADS CATEGORY  1: Negative. Electronically Signed   By: Annia Belt M.D.   On: 07/17/2022 13:00    Assessment & Plan:  .DM type 2, controlled, with complication Assessment & Plan: Diagnosed with A1c of 6.6 in Jan 2022.  Managed with ozempic for the past year. Advised to resume aspirin given her history of CVA .  Statins are C/I given history of  drug induced hepatitis.  Continue losartan   Lab Results  Component Value Date   HGBA1C 6.0 05/30/2022   Lab Results  Component Value Date   LABMICR See below: 03/17/2019   LABMICR See below: 02/03/2019   MICROALBUR 1.0 07/04/2021   MICROALBUR 2.0 04/16/2020       Orders: -     Microalbumin / creatinine urine ratio -     Comprehensive metabolic panel; Future -     Hemoglobin A1c; Future  Hyperlipidemia associated with type 2 diabetes mellitus -     TSH; Future -     Lipid panel; Future  Iron deficiency anemia, unspecified iron deficiency anemia type -     CBC with Differential/Platelet; Future  Diabetes mellitus without complication  Coronary  atherosclerosis due to calcified coronary lesion (CODE) Assessment & Plan: She is asymptomatic and exercising regularly  Lab Results  Component Value Date   CHOL 149 05/12/2021   HDL 86.20 05/12/2021   LDLCALC 39 05/12/2021   LDLDIRECT 112.0 10/13/2016   TRIG 119.0 05/12/2021   CHOLHDL 2 05/12/2021      Degenerative lumbar spinal stenosis Assessment & Plan: Pain is controlled with cymbalta , naproxen and glucosamine chondroitin sulfate/ no changes today    Myalgia due to statin Assessment & Plan: Drug induced hepatitis with prior trial .     Pain of left midfoot Assessment & Plan: Attributed to OA by podiatry evaluation,  not due to the bunion.  Continue naproxen and glucosamine    Primary hypertension Assessment & Plan: she reports compliance with medication regimen  . BP is at goal  Lab Results  Component Value Date   CREATININE 0.68 03/10/2022   Lab Results  Component Value Date   NA 137 03/10/2022   K 4.6 03/10/2022   CL 100 03/10/2022   CO2 28 03/10/2022   Lab Results  Component Value Date   LABMICR See below: 03/17/2019   LABMICR See below: 02/03/2019   MICROALBUR 1.0 07/04/2021   MICROALBUR 2.0 04/16/2020        Bipolar affective disorder, currently depressed, mild Assessment & Plan: Managed for > 15 yrs with lamictal.  Recent financial stressors and finalization of divorced have trigger panic attacks and depression.  Continue cybalta 60 mg daily and increase buspar to 30 mg bid.  No changes .  Psych referral attempted but unsuccessful.  Rtc 3 months   Other orders -     busPIRone HCl; Take 1 tablet (30 mg total) by mouth in the morning and at bedtime.  Dispense: 180 tablet; Refill: 2 -     Cyanocobalamin; Inject 1 mL (1,000 mcg total) into the muscle every 30 (thirty) days.  Dispense: 3 mL; Refill: 4 -     DULoxetine HCl; Take 1 capsule (60 mg total) by mouth daily.  Dispense: 90 capsule; Refill: 1 -     Losartan Potassium; Take  1 tablet (50 mg  total) by mouth daily.  Dispense: 90 tablet; Refill: 0     I provided 30 minutes of face-to-face time during this encounter reviewing patient's last visit with me and podiatry, counseling on currently addressed issues,  and post visit ordering to diagnostics and therapeutics .   Follow-up: No follow-ups on file.   Sherlene Shams, MD

## 2022-07-18 NOTE — Patient Instructions (Addendum)
You  are  due for your Annual Medicare Wellness visit, please schedule this appointment at checkout.   I'M GLAD YOU ARE FEELING BETTER!  NO CHANGES TO MEDS TODAY  WE CAN ADD WELLBUTRIN DOWN THE ROAD IF YOU START HAVING MORE DEPRESSIVE SYMPTOMS  RETURN IN JUNE FOR LABS:  LIVER  CHECK  RETURN TO SEE ME IN [place]

## 2022-07-18 NOTE — Assessment & Plan Note (Signed)
Diagnosed with A1c of 6.6 in Jan 2022.  Managed with ozempic for the past year. Advised to resume aspirin given her history of CVA .  Statins are C/I given history of  drug induced hepatitis.  Continue losartan   Lab Results  Component Value Date   HGBA1C 6.0 05/30/2022   Lab Results  Component Value Date   LABMICR See below: 03/17/2019   LABMICR See below: 02/03/2019   MICROALBUR 1.0 07/04/2021   MICROALBUR 2.0 04/16/2020

## 2022-07-18 NOTE — Assessment & Plan Note (Signed)
She is asymptomatic and exercising regularly  Lab Results  Component Value Date   CHOL 149 05/12/2021   HDL 86.20 05/12/2021   LDLCALC 39 05/12/2021   LDLDIRECT 112.0 10/13/2016   TRIG 119.0 05/12/2021   CHOLHDL 2 05/12/2021

## 2022-07-18 NOTE — Assessment & Plan Note (Signed)
Pain is controlled with cymbalta , naproxen and glucosamine chondroitin sulfate/ no changes today

## 2022-07-18 NOTE — Assessment & Plan Note (Signed)
Managed for > 15 yrs with lamictal.  Recent financial stressors and finalization of divorced have trigger panic attacks and depression.  Continue cybalta 60 mg daily and increase buspar to 30 mg bid.  No changes .  Psych referral attempted but unsuccessful.  Rtc 3 months

## 2022-07-18 NOTE — Assessment & Plan Note (Signed)
she reports compliance with medication regimen  . BP is at goal  Lab Results  Component Value Date   CREATININE 0.68 03/10/2022   Lab Results  Component Value Date   NA 137 03/10/2022   K 4.6 03/10/2022   CL 100 03/10/2022   CO2 28 03/10/2022   Lab Results  Component Value Date   LABMICR See below: 03/17/2019   LABMICR See below: 02/03/2019   MICROALBUR 1.0 07/04/2021   MICROALBUR 2.0 04/16/2020

## 2022-07-18 NOTE — Assessment & Plan Note (Signed)
Drug induced hepatitis with prior trial .

## 2022-07-18 NOTE — Assessment & Plan Note (Signed)
Attributed to OA by podiatry evaluation,  not due to the bunion.  Continue naproxen and glucosamine

## 2022-08-01 ENCOUNTER — Telehealth: Payer: Self-pay | Admitting: Internal Medicine

## 2022-08-01 NOTE — Telephone Encounter (Signed)
Contacted Katherine Jimenez to schedule their annual wellness visit. Appointment made for 08/09/2022.  Thank you,  [facility] Support [facility] Direct dial  [phone]

## 2022-08-09 ENCOUNTER — Ambulatory Visit (INDEPENDENT_AMBULATORY_CARE_PROVIDER_SITE_OTHER): Payer: Medicare HMO

## 2022-08-09 VITALS — Wt 141.0 lb

## 2022-08-09 DIAGNOSIS — Z0000 Encounter for general adult medical examination without abnormal findings: Secondary | ICD-10-CM | POA: Diagnosis not present

## 2022-08-09 NOTE — Patient Instructions (Addendum)
Katherine Jimenez , Thank you for taking time to come for your Medicare Wellness Visit. I appreciate your ongoing commitment to your health goals. Please review the following plan we discussed and let me know if I can assist you in the future.   These are the goals we discussed:  Goals       Patient Stated     I would like to increase water aerobics up to 5 days (pt-stated)      Other     Identify mental health resources      Activities and task to complete in order to accomplish goals.   Discussed in-network mental health provider follow up-patient will switch providers has another mental health provider that she would like to call and follow up with  Patient agreeable to start [facility] support group Patient agreeable to increasing swim workouts at the [facility] is 4x per week         This is a list of the screening recommended for you and due dates:  Health Maintenance  Topic Date Due   COVID-19 Vaccine (4 - 2023-24 season) 12/02/2021   Yearly kidney health urinalysis for diabetes  07/05/2022   Flu Shot  11/02/2022   Hemoglobin A1C  11/28/2022   Eye exam for diabetics  01/21/2023   Yearly kidney function blood test for diabetes  03/11/2023   Mammogram  07/14/2023   Complete foot exam   07/18/2023   Medicare Annual Wellness Visit  08/09/2023   Pneumonia Vaccine (3 of 3 - PPSV23 or PCV20) 11/20/2023   Colon Cancer Screening  01/17/2026   DTaP/Tdap/Td vaccine (2 - Td or Tdap) 11/19/2028   DEXA scan (bone density measurement)  Completed   Hepatitis C Screening: USPSTF Recommendation to screen - Ages 1-79 yo.  Completed   Zoster (Shingles) Vaccine  Completed   HPV Vaccine  Aged Out    Advanced directives: Please bring a copy of your health care power of attorney and living will to the office to be added to your chart at your convenience.   Conditions/risks identified: Aim for 30 minutes of exercise or brisk walking, 6-8 glasses of water, and 5 servings of fruits and vegetables  each dayDiabetes Mellitus and Nutrition, Adult When you have diabetes, or diabetes mellitus, it is very important to have healthy eating habits because your blood sugar (glucose) levels are greatly affected by what you eat and drink. Eating healthy foods in the right amounts, at about the same times every day, can help you: Manage your blood glucose. Lower your risk of heart disease. Improve your blood pressure. Reach or maintain a healthy weight. What can affect my meal plan? Every person with diabetes is different, and each person has different needs for a meal plan. Your health care provider may recommend that you work with a dietitian to make a meal plan that is best for you. Your meal plan may vary depending on factors such as: The calories you need. The medicines you take. Your weight. Your blood glucose, blood pressure, and cholesterol levels. Your activity level. Other health conditions you have, such as heart or kidney disease. How do carbohydrates affect me? Carbohydrates, also called carbs, affect your blood glucose level more than any other type of food. Eating carbs raises the amount of glucose in your blood. It is important to know how many carbs you can safely have in each meal. This is different for every person. Your dietitian can help you calculate how many carbs you should have  at each meal and for each snack. How does alcohol affect me? Alcohol can cause a decrease in blood glucose (hypoglycemia), especially if you use insulin or take certain diabetes medicines by mouth. Hypoglycemia can be a life-threatening condition. Symptoms of hypoglycemia, such as sleepiness, dizziness, and confusion, are similar to symptoms of having too much alcohol. Do not drink alcohol if: Your health care provider tells you not to drink. You are pregnant, may be pregnant, or are planning to become pregnant. If you drink alcohol: Limit how much you have to: 0-1 drink a day for women. 0-2 drinks  a day for men. Know how much alcohol is in your drink. In the [place] one drink equals one 12 oz bottle of beer (355 mL), one 5 oz glass of wine (148 mL), or one 1 oz glass of hard liquor (44 mL). Keep yourself hydrated with water, diet soda, or unsweetened iced tea. Keep in mind that regular soda, juice, and other mixers may contain a lot of sugar and must be counted as carbs. What are tips for following this plan?  Reading food labels Start by checking the serving size on the Nutrition Facts label of packaged foods and drinks. The number of calories and the amount of carbs, fats, and other nutrients listed on the label are based on one serving of the item. Many items contain more than one serving per package. Check the total grams (g) of carbs in one serving. Check the number of grams of saturated fats and trans fats in one serving. Choose foods that have a low amount or none of these fats. Check the number of milligrams (mg) of salt (sodium) in one serving. Most people should limit total sodium intake to less than 2,300 mg per day. Always check the nutrition information of foods labeled as "low-fat" or "nonfat." These foods may be higher in added sugar or refined carbs and should be avoided. Talk to your dietitian to identify your daily goals for nutrients listed on the label. Shopping Avoid buying canned, pre-made, or processed foods. These foods tend to be high in fat, sodium, and added sugar. Shop around the outside edge of the grocery store. This is where you will most often find fresh fruits and vegetables, bulk grains, fresh meats, and fresh dairy products. Cooking Use low-heat cooking methods, such as baking, instead of high-heat cooking methods, such as deep frying. Cook using healthy oils, such as olive, canola, or sunflower oil. Avoid cooking with butter, cream, or high-fat meats. Meal planning Eat meals and snacks regularly, preferably at the same times every day. Avoid going long  periods of time without eating. Eat foods that are high in fiber, such as fresh fruits, vegetables, beans, and whole grains. Eat 4-6 oz (112-168 g) of lean protein each day, such as lean meat, chicken, fish, eggs, or tofu. One ounce (oz) (28 g) of lean protein is equal to: 1 oz (28 g) of meat, chicken, or fish. 1 egg.  cup (62 g) of tofu. Eat some foods each day that contain healthy fats, such as avocado, nuts, seeds, and fish. What foods should I eat? Fruits Berries. Apples. Oranges. Peaches. Apricots. Plums. Grapes. Mangoes. Papayas. Pomegranates. Kiwi. Cherries. Vegetables Leafy greens, including lettuce, spinach, kale, chard, collard greens, mustard greens, and cabbage. Beets. Cauliflower. Broccoli. Carrots. Green beans. Tomatoes. Peppers. Onions. Cucumbers. Brussels sprouts. Grains Whole grains, such as whole-wheat or whole-grain bread, crackers, tortillas, cereal, and pasta. Unsweetened oatmeal. Quinoa. Brown or wild rice. Meats and other proteins  Seafood. Poultry without skin. Lean cuts of poultry and beef. Tofu. Nuts. Seeds. Dairy Low-fat or fat-free dairy products such as milk, yogurt, and cheese. The items listed above may not be a complete list of foods and beverages you can eat and drink. Contact a dietitian for more information. What foods should I avoid? Fruits Fruits canned with syrup. Vegetables Canned vegetables. Frozen vegetables with butter or cream sauce. Grains Refined white flour and flour products such as bread, pasta, snack foods, and cereals. Avoid all processed foods. Meats and other proteins Fatty cuts of meat. Poultry with skin. Breaded or fried meats. Processed meat. Avoid saturated fats. Dairy Full-fat yogurt, cheese, or milk. Beverages Sweetened drinks, such as soda or iced tea. The items listed above may not be a complete list of foods and beverages you should avoid. Contact a dietitian for more information. Questions to ask a health care provider Do  I need to meet with a certified diabetes care and education specialist? Do I need to meet with a dietitian? What number can I call if I have questions? When are the best times to check my blood glucose? Where to find more information: American Diabetes Association: diabetes.org Academy of Nutrition and Dietetics: eatright.[employer] of Diabetes and Digestive and Kidney Diseases: StageSync.si Association of Diabetes Care & Education Specialists: diabeteseducator.org Summary It is important to have healthy eating habits because your blood sugar (glucose) levels are greatly affected by what you eat and drink. It is important to use alcohol carefully. A healthy meal plan will help you manage your blood glucose and lower your risk of heart disease. Your health care provider may recommend that you work with a dietitian to make a meal plan that is best for you. This information is not intended to replace advice given to you by your health care provider. Make sure you discuss any questions you have with your health care provider. Document Revised: 10/22/2019 Document Reviewed: 10/22/2019 Elsevier Patient Education  2023 [employer]. .   Next appointment: Follow up in one year for your annual wellness visit    Preventive Care 65 Years and Older, Female Preventive care refers to lifestyle choices and visits with your health care provider that can promote health and wellness. What does preventive care include? A yearly physical exam. This is also called an annual well check. Dental exams once or twice a year. Routine eye exams. Ask your health care provider how often you should have your eyes checked. Personal lifestyle choices, including: Daily care of your teeth and gums. Regular physical activity. Eating a healthy diet. Avoiding tobacco and drug use. Limiting alcohol use. Practicing safe sex. Taking low-dose aspirin every day. Taking vitamin and mineral supplements as  recommended by your health care provider. What happens during an annual well check? The services and screenings done by your health care provider during your annual well check will depend on your age, overall health, lifestyle risk factors, and family history of disease. Counseling  Your health care provider may ask you questions about your: Alcohol use. Tobacco use. Drug use. Emotional well-being. Home and relationship well-being. Sexual activity. Eating habits. History of falls. Memory and ability to understand (cognition). Work and work Astronomer. Reproductive health. Screening  You may have the following tests or measurements: Height, weight, and BMI. Blood pressure. Lipid and cholesterol levels. These may be checked every 5 years, or more frequently if you are over 26 years old. Skin check. Lung cancer screening. You may have this screening every  year starting at age 45 if you have a 30-pack-year history of smoking and currently smoke or have quit within the past 15 years. Fecal occult blood test (FOBT) of the stool. You may have this test every year starting at age 38. Flexible sigmoidoscopy or colonoscopy. You may have a sigmoidoscopy every 5 years or a colonoscopy every 10 years starting at age 29. Hepatitis C blood test. Hepatitis B blood test. Sexually transmitted disease (STD) testing. Diabetes screening. This is done by checking your blood sugar (glucose) after you have not eaten for a while (fasting). You may have this done every 1-3 years. Bone density scan. This is done to screen for osteoporosis. You may have this done starting at age 13. Mammogram. This may be done every 1-2 years. Talk to your health care provider about how often you should have regular mammograms. Talk with your health care provider about your test results, treatment options, and if necessary, the need for more tests. Vaccines  Your health care provider may recommend certain vaccines, such  as: Influenza vaccine. This is recommended every year. Tetanus, diphtheria, and acellular pertussis (Tdap, Td) vaccine. You may need a Td booster every 10 years. Zoster vaccine. You may need this after age 48. Pneumococcal 13-valent conjugate (PCV13) vaccine. One dose is recommended after age 54. Pneumococcal polysaccharide (PPSV23) vaccine. One dose is recommended after age 68. Talk to your health care provider about which screenings and vaccines you need and how often you need them. This information is not intended to replace advice given to you by your health care provider. Make sure you discuss any questions you have with your health care provider. Document Released: 04/16/2015 Document Revised: 12/08/2015 Document Reviewed: 01/19/2015 Elsevier Interactive Patient Education  2017 [employer].  Fall Prevention in the Home Falls can cause injuries. They can happen to people of all ages. There are many things you can do to make your home safe and to help prevent falls. What can I do on the outside of my home? Regularly fix the edges of walkways and driveways and fix any cracks. Remove anything that might make you trip as you walk through a door, such as a raised step or threshold. Trim any bushes or trees on the path to your home. Use bright outdoor lighting. Clear any walking paths of anything that might make someone trip, such as rocks or tools. Regularly check to see if handrails are loose or broken. Make sure that both sides of any steps have handrails. Any raised decks and porches should have guardrails on the edges. Have any leaves, snow, or ice cleared regularly. Use sand or salt on walking paths during winter. Clean up any spills in your garage right away. This includes oil or grease spills. What can I do in the bathroom? Use night lights. Install grab bars by the toilet and in the tub and shower. Do not use towel bars as grab bars. Use non-skid mats or decals in the tub or  shower. If you need to sit down in the shower, use a plastic, non-slip stool. Keep the floor dry. Clean up any water that spills on the floor as soon as it happens. Remove soap buildup in the tub or shower regularly. Attach bath mats securely with double-sided non-slip rug tape. Do not have throw rugs and other things on the floor that can make you trip. What can I do in the bedroom? Use night lights. Make sure that you have a light by your bed that  is easy to reach. Do not use any sheets or blankets that are too big for your bed. They should not hang down onto the floor. Have a firm chair that has side arms. You can use this for support while you get dressed. Do not have throw rugs and other things on the floor that can make you trip. What can I do in the kitchen? Clean up any spills right away. Avoid walking on wet floors. Keep items that you use a lot in easy-to-reach places. If you need to reach something above you, use a strong step stool that has a grab bar. Keep electrical cords out of the way. Do not use floor polish or wax that makes floors slippery. If you must use wax, use non-skid floor wax. Do not have throw rugs and other things on the floor that can make you trip. What can I do with my stairs? Do not leave any items on the stairs. Make sure that there are handrails on both sides of the stairs and use them. Fix handrails that are broken or loose. Make sure that handrails are as long as the stairways. Check any carpeting to make sure that it is firmly attached to the stairs. Fix any carpet that is loose or worn. Avoid having throw rugs at the top or bottom of the stairs. If you do have throw rugs, attach them to the floor with carpet tape. Make sure that you have a light switch at the top of the stairs and the bottom of the stairs. If you do not have them, ask someone to add them for you. What else can I do to help prevent falls? Wear shoes that: Do not have high heels. Have  rubber bottoms. Are comfortable and fit you well. Are closed at the toe. Do not wear sandals. If you use a stepladder: Make sure that it is fully opened. Do not climb a closed stepladder. Make sure that both sides of the stepladder are locked into place. Ask someone to hold it for you, if possible. Clearly mark and make sure that you can see: Any grab bars or handrails. First and last steps. Where the edge of each step is. Use tools that help you move around (mobility aids) if they are needed. These include: Canes. Walkers. Scooters. Crutches. Turn on the lights when you go into a dark area. Replace any light bulbs as soon as they burn out. Set up your furniture so you have a clear path. Avoid moving your furniture around. If any of your floors are uneven, fix them. If there are any pets around you, be aware of where they are. Review your medicines with your doctor. Some medicines can make you feel dizzy. This can increase your chance of falling. Ask your doctor what other things that you can do to help prevent falls. This information is not intended to replace advice given to you by your health care provider. Make sure you discuss any questions you have with your health care provider. Document Released: 01/14/2009 Document Revised: 08/26/2015 Document Reviewed: 04/24/2014 Elsevier Interactive Patient Education  2017 [employer].

## 2022-08-09 NOTE — Progress Notes (Addendum)
Subjective:   Katherine Jimenez is a 72 y.o. female who presents for Medicare Annual (Subsequent) preventive examination.  Review of Systems    I connected with  [name] on 08/09/22 by a audio enabled telemedicine application and verified that I am speaking with the correct person using two identifiers.  Patient Location: Home  Provider Location: Home Office  I discussed the limitations of evaluation and management by telemedicine. The patient expressed understanding and agreed to proceed.  Cardiac Risk Factors include: advanced age (>70men, >85 women);diabetes mellitus;hypertension     Objective:    Today's Vitals   08/09/22 1512  Weight: 141 lb (64 kg)  PainSc: 4    Body mass index is 27.54 kg/m.     08/09/2022    3:22 PM 08/06/2021   12:08 PM 03/21/2021    5:28 PM 03/18/2021   12:59 PM 12/14/2020    3:43 PM 12/03/2019    9:55 AM 10/14/2018   12:11 PM  Advanced Directives  Does Patient Have a Medical Advance Directive? Yes No No No No No No  Type of Estate agent of [place];Living will        Copy of Healthcare Power of Attorney in Chart? No - copy requested        Would patient like information on creating a medical advance directive?     No - Patient declined No - Patient declined No - Patient declined    Current Medications (verified) Outpatient Encounter Medications as of 08/09/2022  Medication Sig   acetaminophen (TYLENOL) 325 MG tablet Take 650 mg by mouth every 6 (six) hours as needed.   aspirin EC 81 MG tablet Take 81 mg by mouth daily. Swallow whole.   Biotin 5 MG CAPS Take 2 capsules by mouth 2 (two) times daily.    blood glucose meter kit and supplies Dispense Contour next bayer meter. E11.9 To check blood glucose  Once a day.   busPIRone (BUSPAR) 30 MG tablet Take 1 tablet (30 mg total) by mouth in the morning and at bedtime.   Cholecalciferol (VITAMIN D3) 1000 units CAPS Take 1,000 Units by mouth daily.    cyanocobalamin (VITAMIN  B12) 1000 MCG/ML injection Inject 1 mL (1,000 mcg total) into the muscle every 30 (thirty) days.   donepezil (ARICEPT) 10 MG tablet Take 5 mg by mouth daily.   DULoxetine (CYMBALTA) 60 MG capsule Take 1 capsule (60 mg total) by mouth daily.   glucose blood (FREESTYLE LITE) test strip Use once daily  as instructed to test blood sugar E 11.9   lamoTRIgine (LAMICTAL) 200 MG tablet TAKE 1 TABLET BY MOUTH TWICE A DAY   Lancets (FREESTYLE) lancets Use once daily to check blood sugars  E11.9   losartan (COZAAR) 50 MG tablet Take 1 tablet (50 mg total) by mouth daily.   naproxen sodium (ALEVE) 220 MG tablet Take 220 mg by mouth daily as needed.   nitroGLYCERIN (NITROSTAT) 0.4 MG SL tablet Place 1 tablet (0.4 mg total) under the tongue every 5 (five) minutes as needed for chest pain.   omeprazole (PRILOSEC) 40 MG capsule TAKE ONE CAPSULE BY MOUTH DAILY   Semaglutide,0.25 or 0.5MG /DOS, (OZEMPIC, 0.25 OR 0.5 MG/DOSE,) 2 MG/1.5ML SOPN Inject 0.5 mg into the skin once a week. Inject 0.25 mg weekly for 4 weeks then increase to 0.5 mg weekly   Syringe, Disposable, 1 ML MISC For use with B12 injections   tretinoin (RETIN-A) 0.05 % cream Apply pea-sized amount to  face at bedtime, wash off in morning.   zolpidem (AMBIEN) 10 MG tablet Take 1 tablet (10 mg total) by mouth at bedtime as needed for sleep.   No facility-administered encounter medications on file as of 08/09/2022.    Allergies (verified) Atorvastatin, Celebrex [celecoxib], and Thimerosal (thiomersal)   History: Past Medical History:  Diagnosis Date   Arthritis    back, knees, fingers   Bipolar disorder (HCC)    Bunion, left    Depression    Diabetes mellitus    diet controlled.    Elevated liver enzymes 05/31/2021   Hypertension    Lab test positive for detection of COVID-19 virus 03/02/2021   Lower back pain    S/P fall   Squamous cell skin cancer, face    [facility]   Treadmill stress test negative for angina pectoris June 2013   Past  Surgical History:  Procedure Laterality Date   BLADDER AUGMENTATION     BLADDER SURGERY     CARDIAC CATHETERIZATION  03/05/12   no stents   CATARACT EXTRACTION W/PHACO Right 07/19/2015   Procedure: CATARACT EXTRACTION PHACO AND INTRAOCULAR LENS PLACEMENT (IOC) right eye;  Surgeon: Sherald Hess, MD;  Location: [facility];  Service: Ophthalmology;  Laterality: Right;  BORDERLINE DIABETIC - oral meds   CATARACT EXTRACTION W/PHACO Left 05/28/2017   Procedure: CATARACT EXTRACTION PHACO AND INTRAOCULAR LENS PLACEMENT (IOC) LEFT DIABETIC;  Surgeon: Lockie Mola, MD;  Location: [facility];  Service: Ophthalmology;  Laterality: Left;  Diabetic - oal meds   JOINT REPLACEMENT  2012   bilateral hip   KNEE ARTHROSCOPY     TOTAL HIP ARTHROPLASTY  2012   TOTAL KNEE ARTHROPLASTY Right 06/04/2017   Procedure: TOTAL KNEE ARTHROPLASTY;  Surgeon: Lyndle Herrlich, MD;  Location: [facility];  Service: Orthopedics;  Laterality: Right;   TUBAL LIGATION     VAGINAL DELIVERY     2   Family History  Problem Relation Age of Onset   Heart disease Mother    Stroke Mother    Heart disease Father    Dementia Father    Heart disease Maternal Grandmother    Heart disease Maternal Grandfather    Heart disease Paternal Grandmother    Heart disease Paternal Grandfather    Breast cancer Maternal Aunt    Social History   Socioeconomic History   Marital status: Divorced    Spouse name: Not on file   Number of children: Not on file   Years of education: Not on file   Highest education level: Not on file  Occupational History   Not on file  Tobacco Use   Smoking status: Never   Smokeless tobacco: Never  Vaping Use   Vaping Use: Never used  Substance and Sexual Activity   Alcohol use: Not Currently    Alcohol/week: 1.0 standard drink of alcohol    Types: 1 Cans of beer per week    Comment: occassionally   Drug use: No   Sexual activity: Not on file  Other Topics Concern    Not on file  Social History Narrative   ** Merged History Encounter **       Lives in [place] with husband.         Social Determinants of Health   Financial Resource Strain: Low Risk  (08/09/2022)   Overall Financial Resource Strain (CARDIA)    Difficulty of Paying Living Expenses: Not hard at all  Food Insecurity: No Food Insecurity (08/09/2022)  Hunger Vital Sign    Worried About Running Out of Food in the Last Year: Never true    Ran Out of Food in the Last Year: Never true  Transportation Needs: No Transportation Needs (08/09/2022)   PRAPARE - Administrator, Civil Service (Medical): No    Lack of Transportation (Non-Medical): No  Physical Activity: Sufficiently Active (08/09/2022)   Exercise Vital Sign    Days of Exercise per Week: 7 days    Minutes of Exercise per Session: 30 min  Stress: No Stress Concern Present (08/09/2022)   [employer] of Occupational Health - Occupational Stress Questionnaire    Feeling of Stress : Not at all  Social Connections: Moderately Integrated (08/09/2022)   Social Connection and Isolation Panel [NHANES]    Frequency of Communication with Friends and Family: More than three times a week    Frequency of Social Gatherings with Friends and Family: More than three times a week    Attends Religious Services: More than 4 times per year    Active Member of [employer] or Organizations: Yes    Attends Engineer, structural: More than 4 times per year    Marital Status: Divorced    Tobacco Counseling Counseling given: Yes   Clinical Intake:  Pre-visit preparation completed: Yes  Pain : 0-10 Pain Score: 4  Pain Location: Back Pain Descriptors / Indicators: Aching Pain Onset: More than a month ago Pain Frequency: Intermittent     BMI - recorded: 27.54 Nutritional Status: BMI 25 -29 Overweight Nutritional Risks: None Diabetes: Yes CBG done?: No Did pt. bring in CBG monitor from home?: No  How often do you need to have  someone help you when you read instructions, pamphlets, or other written materials from your doctor or pharmacy?: 1 - Never  Diabetic?Yes  Interpreter Needed?: No  Information entered by :: Fredirick Maudlin   Activities of Daily Living    08/09/2022    3:24 PM  In your present state of health, do you have any difficulty performing the following activities:  Hearing? 0  Vision? 0  Difficulty concentrating or making decisions? 0  Walking or climbing stairs? 0  Dressing or bathing? 0  Doing errands, shopping? 0  Preparing Food and eating ? N  Using the Toilet? N  In the past six months, have you accidently leaked urine? Y  Comment uses pads hx of bladder sugery  Do you have problems with loss of bowel control? N  Managing your Medications? N  Managing your Finances? N  Housekeeping or managing your Housekeeping? N    Patient Care Team: Sherlene Shams, MD as PCP - General (Internal Medicine)  Indicate any recent Medical Services you may have received from other than [facility] providers in the past year (date may be approximate).     Assessment:   This is a routine wellness examination for [place].  Hearing/Vision screen Hearing Screening - Comments:: Denies hearing difficulties   Vision Screening - Comments:: Wears rx glasses - up to date with routine eye exams with  [facility]   Dietary issues and exercise activities discussed: Current Exercise Habits: Structured exercise class, Type of exercise: Other - see comments;walking (water exercise), Time (Minutes): 60, Frequency (Times/Week): 3, Weekly Exercise (Minutes/Week): 180, Intensity: Mild, Exercise limited by: None identified   Goals Addressed               This Visit's Progress     Patient Stated  I would like to increase water aerobics up to 5 days (pt-stated)   On track     Depression Screen    08/09/2022    3:17 PM 07/18/2022    2:53 PM 06/07/2022    2:10 PM 05/30/2022   10:24 AM 03/10/2022     9:20 AM 01/02/2022    4:36 PM 12/22/2021   11:19 AM  PHQ 2/9 Scores  PHQ - 2 Score 2 0 2 3 0 0 1  PHQ- 9 Score 3 0 6 11  5 1     Fall Risk    08/09/2022    3:23 PM 07/18/2022    2:53 PM 05/30/2022   10:24 AM 03/10/2022    9:20 AM 01/02/2022    4:36 PM  Fall Risk   Falls in the past year? 0 0 1 1 0  Number falls in past yr: 0 0 0 1   Injury with Fall? 0 0 0 1   Risk for fall due to : No Fall Risks No Fall Risks History of fall(s) History of fall(s) No Fall Risks  Follow up Falls prevention discussed;Falls evaluation completed Falls evaluation completed Falls evaluation completed Falls evaluation completed Falls evaluation completed    FALL RISK PREVENTION PERTAINING TO THE HOME:  Any stairs in or around the home? No  If so, are there any without handrails? No  Home free of loose throw rugs in walkways, pet beds, electrical cords, etc? No  Adequate lighting in your home to reduce risk of falls? Yes   ASSISTIVE DEVICES UTILIZED TO PREVENT FALLS:  Life alert? No  Use of a cane, walker or w/c? No  Grab bars in the bathroom? No  Shower chair or bench in shower? No  Elevated toilet seat or a handicapped toilet? No   TIMED UP AND GO:  Was the test performed? No . Televisit   Cognitive Function:        08/09/2022    3:20 PM 10/14/2018   12:24 PM  6CIT Screen  What Year? 0 points 0 points  What month? 0 points 0 points  What time? 0 points 0 points  Count back from 20 0 points 0 points  Months in reverse 0 points 0 points  Repeat phrase 2 points 0 points  Total Score 2 points 0 points    Immunizations Immunization History  Administered Date(s) Administered   Fluad Quad(high Dose 65+) 01/06/2021   Influenza, High Dose Seasonal PF 11/20/2018, 02/27/2022   Influenza-Unspecified 12/02/2013, 12/31/2014, 12/27/2015, 01/03/2017, 11/24/2017, 12/03/2019   PFIZER(Purple Top)SARS-COV-2 Vaccination 05/26/2019, 06/16/2019, 03/17/2020   Pneumococcal Conjugate-13 11/20/2018    Pneumococcal Polysaccharide-23 04/22/2015   Respiratory Syncytial Virus Vaccine,Recomb Aduvanted(Arexvy) 04/29/2022   Tdap 11/20/2018   Zoster Recombinat (Shingrix) 11/20/2018, 04/20/2021, 09/26/2021    TDAP status: Up to date  Flu Vaccine status: Up to date  Pneumococcal vaccine status: Up to date  Covid-19 vaccine status: Completed vaccines  Qualifies for Shingles Vaccine? Yes   Zostavax completed Yes   Shingrix Completed?: Yes  Screening Tests Health Maintenance  Topic Date Due   COVID-19 Vaccine (4 - 2023-24 season) 12/02/2021   Diabetic kidney evaluation - Urine ACR  07/05/2022   INFLUENZA VACCINE  11/02/2022   HEMOGLOBIN A1C  11/28/2022   OPHTHALMOLOGY EXAM  01/21/2023   Diabetic kidney evaluation - eGFR measurement  03/11/2023   MAMMOGRAM  07/14/2023   FOOT EXAM  07/18/2023   Medicare Annual Wellness (AWV)  08/09/2023   Pneumonia Vaccine 72+ Years old (3 of  3 - PPSV23 or PCV20) 11/20/2023   COLONOSCOPY (Pts 45-75yrs Insurance coverage will need to be confirmed)  01/17/2026   DTaP/Tdap/Td (2 - Td or Tdap) 11/19/2028   DEXA SCAN  Completed   Hepatitis C Screening  Completed   Zoster Vaccines- Shingrix  Completed   HPV VACCINES  Aged Out    Health Maintenance  Health Maintenance Due  Topic Date Due   COVID-19 Vaccine (4 - 2023-24 season) 12/02/2021   Diabetic kidney evaluation - Urine ACR  07/05/2022    Colorectal cancer screening: Type of screening: Colonoscopy. Completed 01/18/2016. Repeat every 10 years  Mammogram status: Completed 07/14/2022. Repeat every year  Bone Density status: Completed 01/05/2020. Results reflect: Bone density results: OSTEOPOROSIS. Repeat every 5 years.  Lung Cancer Screening: (Low Dose CT Chest recommended if Age 74-80 years, 30 pack-year currently smoking OR have quit w/in 15years.) does not qualify.    Additional Screening:  Hepatitis C Screening: does qualify; Completed 12/20/21  Vision Screening: Recommended annual  ophthalmology exams for early detection of glaucoma and other disorders of the eye. Is the patient up to date with their annual eye exam?  Yes  Who is the provider or what is the name of the office in which the patient attends annual eye exams? [facility]  If pt is not established with a provider, would they like to be referred to a provider to establish care? No .   Dental Screening: Recommended annual dental exams for proper oral hygiene  Community Resource Referral / Chronic Care Management: CRR required this visit?  No   CCM required this visit?  No      Plan:     I have personally reviewed and noted the following in the patient's chart:   Medical and social history Use of alcohol, tobacco or illicit drugs  Current medications and supplements including opioid prescriptions. Patient is not currently taking opioid prescriptions. Functional ability and status Nutritional status Physical activity Advanced directives List of other physicians Hospitalizations, surgeries, and ER visits in previous 12 months Vitals Screenings to include cognitive, depression, and falls Referrals and appointments  In addition, I have reviewed and discussed with patient certain preventive protocols, quality metrics, and best practice recommendations. A written personalized care plan for preventive services as well as general preventive health recommendations were provided to patient.     Annabell Sabal, CMA   08/09/2022   Nurse Notes: None    I have reviewed the above information and agree with above.   Duncan Dull, MD

## 2022-08-13 ENCOUNTER — Other Ambulatory Visit: Payer: Self-pay | Admitting: Internal Medicine

## 2022-09-01 ENCOUNTER — Telehealth: Payer: Self-pay

## 2022-09-01 DIAGNOSIS — E119 Type 2 diabetes mellitus without complications: Secondary | ICD-10-CM | POA: Diagnosis not present

## 2022-09-01 DIAGNOSIS — Z961 Presence of intraocular lens: Secondary | ICD-10-CM | POA: Diagnosis not present

## 2022-09-01 DIAGNOSIS — H26492 Other secondary cataract, left eye: Secondary | ICD-10-CM | POA: Diagnosis not present

## 2022-09-01 DIAGNOSIS — H353131 Nonexudative age-related macular degeneration, bilateral, early dry stage: Secondary | ICD-10-CM | POA: Diagnosis not present

## 2022-09-01 LAB — HM DIABETES EYE EXAM

## 2022-09-01 NOTE — Telephone Encounter (Signed)
Received pt's patient assistance medication. Pt is aware and stated that she will pick it up on Monday.   Ozempic: 5 boxes

## 2022-09-11 NOTE — Telephone Encounter (Signed)
Pt has pick up medication.

## 2022-09-15 ENCOUNTER — Telehealth: Payer: Self-pay | Admitting: Internal Medicine

## 2022-09-15 MED ORDER — BUSPIRONE HCL 30 MG PO TABS: 30.0000 mg | ORAL_TABLET | Freq: Two times a day (BID) | ORAL | 1 refills | Status: DC

## 2022-09-15 NOTE — Telephone Encounter (Signed)
Medication has been refilled.

## 2022-09-15 NOTE — Telephone Encounter (Signed)
Prescription Request  09/15/2022  LOV: 07/18/2022  What is the name of the medication or equipment? busPIRone (BUSPAR) 30 MG tablet  Have you contacted your pharmacy to request a refill? Yes   Which pharmacy would you like this sent to?   [id number] [address], [place] - [address] [place] [address] Phone: [phone] Fax: [phone]    Patient notified that their request is being sent to the clinical staff for review and that they should receive a response within 2 business days.   Please advise at [facility] [phone]

## 2022-09-19 ENCOUNTER — Other Ambulatory Visit (INDEPENDENT_AMBULATORY_CARE_PROVIDER_SITE_OTHER): Payer: Medicare HMO

## 2022-09-19 DIAGNOSIS — D509 Iron deficiency anemia, unspecified: Secondary | ICD-10-CM

## 2022-09-19 DIAGNOSIS — E1169 Type 2 diabetes mellitus with other specified complication: Secondary | ICD-10-CM

## 2022-09-19 DIAGNOSIS — E785 Hyperlipidemia, unspecified: Secondary | ICD-10-CM | POA: Diagnosis not present

## 2022-09-19 DIAGNOSIS — E118 Type 2 diabetes mellitus with unspecified complications: Secondary | ICD-10-CM

## 2022-09-19 LAB — CBC WITH DIFFERENTIAL/PLATELET
Basophils Absolute: 0 10*3/uL (ref 0.0–0.1)
Basophils Relative: 0.7 % (ref 0.0–3.0)
Eosinophils Absolute: 0.3 10*3/uL (ref 0.0–0.7)
Eosinophils Relative: 4.8 % (ref 0.0–5.0)
HCT: 37.4 % (ref 36.0–46.0)
Hemoglobin: 11.7 g/dL — ABNORMAL LOW (ref 12.0–15.0)
Lymphocytes Relative: 42.9 % (ref 12.0–46.0)
Lymphs Abs: 2.3 10*3/uL (ref 0.7–4.0)
MCHC: 31.3 g/dL (ref 30.0–36.0)
MCV: 85.5 fl (ref 78.0–100.0)
Monocytes Absolute: 0.5 10*3/uL (ref 0.1–1.0)
Monocytes Relative: 9.8 % (ref 3.0–12.0)
Neutro Abs: 2.2 10*3/uL (ref 1.4–7.7)
Neutrophils Relative %: 41.8 % — ABNORMAL LOW (ref 43.0–77.0)
Platelets: 356 10*3/uL (ref 150.0–400.0)
RBC: 4.38 Mil/uL (ref 3.87–5.11)
RDW: 15.5 % (ref 11.5–15.5)
WBC: 5.3 10*3/uL (ref 4.0–10.5)

## 2022-09-19 LAB — LIPID PANEL
Cholesterol: 220 mg/dL — ABNORMAL HIGH (ref 0–200)
HDL: 91.1 mg/dL (ref >39.00)
LDL Cholesterol: 108 mg/dL — ABNORMAL HIGH (ref 0–99)
NonHDL: 129.3
Total CHOL/HDL Ratio: 2
Triglycerides: 106 mg/dL (ref 0.0–149.0)
VLDL: 21.2 mg/dL (ref 0.0–40.0)

## 2022-09-19 LAB — COMPREHENSIVE METABOLIC PANEL
ALT: 9 U/L (ref 0–35)
AST: 13 U/L (ref 0–37)
Albumin: 4.1 g/dL (ref 3.5–5.2)
Alkaline Phosphatase: 92 U/L (ref 39–117)
BUN: 9 mg/dL (ref 6–23)
CO2: 29 mEq/L (ref 19–32)
Calcium: 9.3 mg/dL (ref 8.4–10.5)
Chloride: 101 mEq/L (ref 96–112)
Creatinine, Ser: 0.77 mg/dL (ref 0.40–1.20)
GFR: 78.06 mL/min (ref >60.00)
Glucose, Bld: 120 mg/dL — ABNORMAL HIGH (ref 70–99)
Potassium: 4.6 mEq/L (ref 3.5–5.1)
Sodium: 139 mEq/L (ref 135–145)
Total Bilirubin: 0.3 mg/dL (ref 0.2–1.2)
Total Protein: 7.2 g/dL (ref 6.0–8.3)

## 2022-09-19 LAB — TSH: TSH: 1.14 u[IU]/mL (ref 0.35–5.50)

## 2022-09-19 LAB — HEMOGLOBIN A1C: Hgb A1c MFr Bld: 6 % (ref 4.6–6.5)

## 2022-09-21 ENCOUNTER — Telehealth: Payer: Self-pay

## 2022-09-21 NOTE — Telephone Encounter (Signed)
-----   Message from Eulis Foster, FNP sent at 09/21/2022  9:48 AM EDT ----- Labs stable. Watch intake of sugars and starches. A1c is stable

## 2022-09-21 NOTE — Telephone Encounter (Signed)
LMTCB in regards to lab results.  

## 2022-10-05 DIAGNOSIS — M1712 Unilateral primary osteoarthritis, left knee: Secondary | ICD-10-CM | POA: Diagnosis not present

## 2022-10-10 DIAGNOSIS — M1712 Unilateral primary osteoarthritis, left knee: Secondary | ICD-10-CM | POA: Diagnosis not present

## 2022-10-10 DIAGNOSIS — M25562 Pain in left knee: Secondary | ICD-10-CM | POA: Diagnosis not present

## 2022-10-17 ENCOUNTER — Ambulatory Visit (INDEPENDENT_AMBULATORY_CARE_PROVIDER_SITE_OTHER): Payer: Medicare HMO | Admitting: Internal Medicine

## 2022-10-17 ENCOUNTER — Encounter: Payer: Self-pay | Admitting: Internal Medicine

## 2022-10-17 VITALS — BP 128/66 | HR 99 | Temp 99.3°F | Ht 60.0 in | Wt 142.0 lb

## 2022-10-17 DIAGNOSIS — M791 Myalgia, unspecified site: Secondary | ICD-10-CM

## 2022-10-17 DIAGNOSIS — E119 Type 2 diabetes mellitus without complications: Secondary | ICD-10-CM | POA: Diagnosis not present

## 2022-10-17 DIAGNOSIS — E1169 Type 2 diabetes mellitus with other specified complication: Secondary | ICD-10-CM

## 2022-10-17 DIAGNOSIS — Z7985 Long-term (current) use of injectable non-insulin antidiabetic drugs: Secondary | ICD-10-CM

## 2022-10-17 DIAGNOSIS — Z7984 Long term (current) use of oral hypoglycemic drugs: Secondary | ICD-10-CM

## 2022-10-17 DIAGNOSIS — F01A4 Vascular dementia, mild, with anxiety: Secondary | ICD-10-CM

## 2022-10-17 DIAGNOSIS — E785 Hyperlipidemia, unspecified: Secondary | ICD-10-CM

## 2022-10-17 DIAGNOSIS — E118 Type 2 diabetes mellitus with unspecified complications: Secondary | ICD-10-CM

## 2022-10-17 DIAGNOSIS — T466X5A Adverse effect of antihyperlipidemic and antiarteriosclerotic drugs, initial encounter: Secondary | ICD-10-CM | POA: Diagnosis not present

## 2022-10-17 LAB — POCT CBG (FASTING - GLUCOSE)-MANUAL ENTRY: Glucose Fasting, POC: 98 mg/dL (ref 70–99)

## 2022-10-17 MED ORDER — LAMOTRIGINE 200 MG PO TABS: 200.0000 mg | ORAL_TABLET | Freq: Two times a day (BID) | ORAL | 1 refills | Status: DC

## 2022-10-17 MED ORDER — "SYRINGE 25G X 1"" 3 ML MISC": 0 refills | Status: DC

## 2022-10-17 MED ORDER — DULOXETINE HCL 60 MG PO CPEP: 60.0000 mg | ORAL_CAPSULE | Freq: Every day | ORAL | 1 refills | Status: DC

## 2022-10-17 MED ORDER — CYANOCOBALAMIN 1000 MCG/ML IJ SOLN: 1000.0000 ug | INTRAMUSCULAR | 4 refills | Status: DC

## 2022-10-17 MED ORDER — LOSARTAN POTASSIUM 50 MG PO TABS: 50.0000 mg | ORAL_TABLET | Freq: Every day | ORAL | 1 refills | Status: DC

## 2022-10-17 MED ORDER — OZEMPIC (0.25 OR 0.5 MG/DOSE) 2 MG/1.5ML ~~LOC~~ SOPN
0.5000 mg | PEN_INJECTOR | SUBCUTANEOUS | 2 refills | Status: DC
Start: 2022-10-17 — End: 2023-12-10

## 2022-10-17 NOTE — Patient Instructions (Addendum)
Keep an eye on your pulse  If it is > 100 after hydration ,  we may add a low dose of toprol

## 2022-10-17 NOTE — Progress Notes (Unsigned)
Subjective:  Patient ID: Katherine Jimenez, female    DOB: 1950-09-02  Age: 72 y.o. MRN: [id number]  CC: The primary encounter diagnosis was DM type 2, controlled, with complication (HCC). Diagnoses of Diabetes mellitus without complication (HCC), Vascular dementia, mild, with anxiety (HCC), Myalgia due to statin, and Hyperlipidemia associated with type 2 diabetes mellitus (HCC) were also pertinent to this visit.   HPI Katherine Jimenez presents for  Chief Complaint  Patient presents with   Medical Management of Chronic Issues    diabetes   ORTHOPEDICS:  1) LEFT KNEE PAIN ,  ELECTIVE SURGERY PLANNED ,  RECEIVED STEROID SHOT  BY DR BOWERS  LAST WEEK .  POSTPONED SURGERY  for 3 MONTHS.  WEARING A BRACE.  TAKING GABAPENTIN PRESCRIBED  BY [facility] . PREPARING HER HOME FOR THE RECOVERY PERIOD  2) RIGHT SHOULDER PAIN:  SHE RECEIVED PRP FOR THE RIGHT SHOULDER  WHICH HELPED  SIGNIFICANTLY, RESULTS WERE NOTED  AFTER 30 DAYS ( DONE IN 2023)   4) Had 2  FALLS recently  DUE TO LEFT KNEE GIVING OUT.    5) MOOD DISORDER:  aggravated by her second DIVORCE which was FINALIZED IN NOVEMBER  AFTER A 3 YR SEPARATION  (married 18 yrs) .  She finally feels adjusted and calm .  Using buspirone prn,  cymbalta and lamictal   6) MCI:  has made the decision to stop  the aricept.  She is functioning well independently     Outpatient Medications Prior to Visit  Medication Sig Dispense Refill   acetaminophen (TYLENOL) 325 MG tablet Take 650 mg by mouth every 6 (six) hours as needed.     Biotin 5 MG CAPS Take 2 capsules by mouth 2 (two) times daily.      blood glucose meter kit and supplies Dispense Contour next bayer meter. E11.9 To check blood glucose  Once a day. 1 each 0   busPIRone (BUSPAR) 30 MG tablet Take 1 tablet (30 mg total) by mouth in the morning and at bedtime. 180 tablet 1   Cholecalciferol (VITAMIN D3) 1000 units CAPS Take 1,000 Units by mouth daily.      diphenhydramine-acetaminophen (TYLENOL  PM) 25-500 MG TABS tablet Take 1 tablet by mouth at bedtime as needed.     gabapentin (NEURONTIN) 100 MG capsule Take 100 mg by mouth 3 (three) times daily.     GLUCOSAMINE HCL PO Take 1 capsule by mouth daily.     glucose blood (FREESTYLE LITE) test strip Use once daily  as instructed to test blood sugar E 11.9 100 each 3   Lancets (FREESTYLE) lancets Use once daily to check blood sugars  E11.9 100 each 3   naproxen sodium (ALEVE) 220 MG tablet Take 220 mg by mouth daily as needed.     omeprazole (PRILOSEC) 40 MG capsule TAKE ONE CAPSULE BY MOUTH DAILY 90 capsule 3   Syringe, Disposable, 1 ML MISC For use with B12 injections 25 each 3   tretinoin (RETIN-A) 0.05 % cream Apply pea-sized amount to face at bedtime, wash off in morning. 20 g 3   zolpidem (AMBIEN) 10 MG tablet TAKE ONE TABLET BY MOUTH EVERY NIGHT AT BEDTIME AS NEEDED FOR SLEEP 30 tablet 5   cyanocobalamin (VITAMIN B12) 1000 MCG/ML injection Inject 1 mL (1,000 mcg total) into the muscle every 30 (thirty) days. 3 mL 4   DULoxetine (CYMBALTA) 60 MG capsule Take 1 capsule (60 mg total) by mouth daily. 90 capsule 1  lamoTRIgine (LAMICTAL) 200 MG tablet TAKE 1 TABLET BY MOUTH TWICE A DAY 180 tablet 1   losartan (COZAAR) 50 MG tablet Take 1 tablet (50 mg total) by mouth daily. 90 tablet 0   Semaglutide,0.25 or 0.5MG /DOS, (OZEMPIC, 0.25 OR 0.5 MG/DOSE,) 2 MG/1.5ML SOPN Inject 0.5 mg into the skin once a week. Inject 0.25 mg weekly for 4 weeks then increase to 0.5 mg weekly 1.5 mL 0   nitroGLYCERIN (NITROSTAT) 0.4 MG SL tablet Place 1 tablet (0.4 mg total) under the tongue every 5 (five) minutes as needed for chest pain. (Patient not taking: Reported on 10/17/2022) 50 tablet 3   aspirin EC 81 MG tablet Take 81 mg by mouth daily. Swallow whole. (Patient not taking: Reported on 10/17/2022)     donepezil (ARICEPT) 10 MG tablet Take 5 mg by mouth daily. (Patient not taking: Reported on 10/17/2022)     No facility-administered medications prior to  visit.    Review of Systems;  Patient denies headache, fevers, malaise, unintentional weight loss, skin rash, eye pain, sinus congestion and sinus pain, sore throat, dysphagia,  hemoptysis , cough, dyspnea, wheezing, chest pain, palpitations, orthopnea, edema, abdominal pain, nausea, melena, diarrhea, constipation, flank pain, dysuria, hematuria, urinary  Frequency, nocturia, numbness, tingling, seizures,  Focal weakness, Loss of consciousness,  Tremor, insomnia, depression, anxiety, and suicidal ideation.      Objective:  BP 128/66   Pulse 99   Temp 99.3 F (37.4 C) (Oral)   Ht 5' (1.524 m)   Wt 142 lb (64.4 kg)   SpO2 95%   BMI 27.73 kg/m   BP Readings from Last 3 Encounters:  10/17/22 128/66  07/18/22 126/74  05/30/22 118/60    Wt Readings from Last 3 Encounters:  10/17/22 142 lb (64.4 kg)  08/09/22 141 lb (64 kg)  07/18/22 141 lb 3.2 oz (64 kg)    Physical Exam Vitals reviewed.  Constitutional:      General: She is not in acute distress.    Appearance: Normal appearance. She is normal weight. She is not ill-appearing, toxic-appearing or diaphoretic.  HENT:     Head: Normocephalic.  Eyes:     General: No scleral icterus.       Right eye: No discharge.        Left eye: No discharge.     Conjunctiva/sclera: Conjunctivae normal.  Cardiovascular:     Rate and Rhythm: Normal rate and regular rhythm.     Heart sounds: Normal heart sounds.  Pulmonary:     Effort: Pulmonary effort is normal. No respiratory distress.     Breath sounds: Normal breath sounds.  Musculoskeletal:        General: Normal range of motion.  Skin:    General: Skin is warm and dry.  Neurological:     General: No focal deficit present.     Mental Status: She is alert and oriented to person, place, and time. Mental status is at baseline.  Psychiatric:        Mood and Affect: Mood normal.        Behavior: Behavior normal.        Thought Content: Thought content normal.        Judgment:  Judgment normal.    Lab Results  Component Value Date   HGBA1C 6.0 09/19/2022   HGBA1C 6.0 05/30/2022   HGBA1C 6.3 12/16/2021    Lab Results  Component Value Date   CREATININE 0.77 09/19/2022   CREATININE 0.68 03/10/2022   CREATININE 0.83  12/16/2021    Lab Results  Component Value Date   WBC 5.3 09/19/2022   HGB 11.7 (L) 09/19/2022   HCT 37.4 09/19/2022   PLT 356.0 09/19/2022   GLUCOSE 120 (H) 09/19/2022   CHOL 220 (H) 09/19/2022   TRIG 106.0 09/19/2022   HDL 91.10 09/19/2022   LDLDIRECT 112.0 10/13/2016   LDLCALC 108 (H) 09/19/2022   ALT 9 09/19/2022   AST 13 09/19/2022   NA 139 09/19/2022   K 4.6 09/19/2022   CL 101 09/19/2022   CREATININE 0.77 09/19/2022   BUN 9 09/19/2022   CO2 29 09/19/2022   TSH 1.14 09/19/2022   INR 1.0 03/21/2021   HGBA1C 6.0 09/19/2022   MICROALBUR 1.4 10/17/2022    MM 3D SCREENING MAMMOGRAM BILATERAL BREAST  Result Date: 07/17/2022 CLINICAL DATA:  Screening. EXAM: DIGITAL SCREENING BILATERAL MAMMOGRAM WITH TOMOSYNTHESIS AND CAD TECHNIQUE: Bilateral screening digital craniocaudal and mediolateral oblique mammograms were obtained. Bilateral screening digital breast tomosynthesis was performed. The images were evaluated with computer-aided detection. COMPARISON:  Previous exam(s). ACR Breast Density Category c: The breasts are heterogeneously dense, which may obscure small masses. FINDINGS: There are no findings suspicious for malignancy. IMPRESSION: No mammographic evidence of malignancy. A result letter of this screening mammogram will be mailed directly to the patient. RECOMMENDATION: Screening mammogram in one year. (Code:SM-B-01Y) BI-RADS CATEGORY  1: Negative. Electronically Signed   By: Annia Belt M.D.   On: 07/17/2022 13:00    Assessment & Plan:  .DM type 2, controlled, with complication (HCC) -     Microalbumin / creatinine urine ratio -     POCT CBG (Fasting - Glucose)  Diabetes mellitus without complication (HCC) -     Ozempic  (0.25 or 0.5 MG/DOSE); Inject 0.5 mg into the skin once a week.  Dispense: 4.5 mL; Refill: 2  Vascular dementia, mild, with anxiety (HCC) Assessment & Plan: Cognitive deficits have not progressed.  She has  stopped Namenda and  aricept    Myalgia due to statin Assessment & Plan: She had Drug induced hepatitis with prior trial .     Hyperlipidemia associated with type 2 diabetes mellitus (HCC) Assessment & Plan: Currently well-controlled on current medications . She has lost 12 lbs since October. . Patient is reminded to schedule an annual eye exam and foot exam is normal today. Patient has no microalbuminuria. Patient is intolerant of  statin therapy  due to drug induced  hepatitis with prior trial of atorvastatin 20 mg daily.Marland Kitchen  LDL is at goal and LFTs are normal.  Taking 50 mg losartan for BP.  No changes today continue metformin, ozempicand losartan   Lab Results  Component Value Date   HGBA1C 6.0 09/19/2022   Lab Results  Component Value Date   LABMICR See below: 03/17/2019   LABMICR See below: 02/03/2019   MICROALBUR 1.4 10/17/2022   MICROALBUR 1.0 07/04/2021    Lab Results  Component Value Date   CHOL 220 (H) 09/19/2022   HDL 91.10 09/19/2022   LDLCALC 108 (H) 09/19/2022   LDLDIRECT 112.0 10/13/2016   TRIG 106.0 09/19/2022   CHOLHDL 2 09/19/2022  . Lab Results  Component Value Date   ALT 9 09/19/2022   AST 13 09/19/2022   ALKPHOS 92 09/19/2022   BILITOT 0.3 09/19/2022      Other orders -     DULoxetine HCl; Take 1 capsule (60 mg total) by mouth daily.  Dispense: 90 capsule; Refill: 1 -     lamoTRIgine; Take 1  tablet (200 mg total) by mouth 2 (two) times daily.  Dispense: 180 tablet; Refill: 1 -     Losartan Potassium; Take 1 tablet (50 mg total) by mouth daily.  Dispense: 90 tablet; Refill: 1 -     Cyanocobalamin; Inject 1 mL (1,000 mcg total) into the muscle every 30 (thirty) days.  Dispense: 3 mL; Refill: 4 -     Syringe; Use for b12 injections  Dispense: 50  each; Refill: 0   Follow-up: Return in about 3 months (around 01/17/2023) for follow up diabetes.   Sherlene Shams, MD

## 2022-10-18 ENCOUNTER — Telehealth: Payer: Self-pay | Admitting: Internal Medicine

## 2022-10-18 LAB — MICROALBUMIN / CREATININE URINE RATIO
Creatinine,U: 185.8 mg/dL
Microalb Creat Ratio: 0.7 mg/g (ref 0.0–30.0)
Microalb, Ur: 1.4 mg/dL (ref 0.0–1.9)

## 2022-10-18 NOTE — Assessment & Plan Note (Addendum)
Currently well-controlled on current medications . She has lost 12 lbs since October. . Patient is reminded to schedule an annual eye exam and foot exam is normal today. Patient has no microalbuminuria. Patient is intolerant of  statin therapy  due to drug induced  hepatitis with prior trial of atorvastatin 20 mg daily.Marland Kitchen  LDL is at goal and LFTs are normal.  Taking 50 mg losartan for BP.  No changes today continue metformin, ozempicand losartan   Lab Results  Component Value Date   HGBA1C 6.0 09/19/2022   Lab Results  Component Value Date   LABMICR See below: 03/17/2019   LABMICR See below: 02/03/2019   MICROALBUR 1.4 10/17/2022   MICROALBUR 1.0 07/04/2021    Lab Results  Component Value Date   CHOL 220 (H) 09/19/2022   HDL 91.10 09/19/2022   LDLCALC 108 (H) 09/19/2022   LDLDIRECT 112.0 10/13/2016   TRIG 106.0 09/19/2022   CHOLHDL 2 09/19/2022  . Lab Results  Component Value Date   ALT 9 09/19/2022   AST 13 09/19/2022   ALKPHOS 92 09/19/2022   BILITOT 0.3 09/19/2022

## 2022-10-18 NOTE — Assessment & Plan Note (Signed)
She had Drug induced hepatitis with prior trial .

## 2022-10-18 NOTE — Telephone Encounter (Signed)
 Placed in quick sign folder for signature.  

## 2022-10-18 NOTE — Assessment & Plan Note (Signed)
Cognitive deficits have not progressed.  She has  stopped Namenda and  aricept

## 2022-10-18 NOTE — Telephone Encounter (Signed)
Patient dropped off document DMV, to be filled out by provider. Patient requested to send it via Call Patient to pick up within 7-days. Document is located in providers color folder at front office.Please advise at Mobile [phone] (mobile)

## 2022-10-20 NOTE — Telephone Encounter (Signed)
LMTCB. Need to let pt know that handicap placard form is complete and ready for pick up. Form is up front in accordion folder.

## 2022-11-03 NOTE — Telephone Encounter (Signed)
This encounter was created in error - please disregard.

## 2022-11-16 ENCOUNTER — Encounter (INDEPENDENT_AMBULATORY_CARE_PROVIDER_SITE_OTHER): Payer: Self-pay

## 2022-11-23 ENCOUNTER — Other Ambulatory Visit: Payer: Self-pay | Admitting: Internal Medicine

## 2022-11-23 NOTE — Telephone Encounter (Signed)
Historical medication  Last OV: 10/17/2022 Next OV: 12/26/2022

## 2022-12-11 ENCOUNTER — Other Ambulatory Visit: Payer: Self-pay | Admitting: Internal Medicine

## 2022-12-11 DIAGNOSIS — M1712 Unilateral primary osteoarthritis, left knee: Secondary | ICD-10-CM | POA: Diagnosis not present

## 2022-12-15 ENCOUNTER — Other Ambulatory Visit: Payer: Self-pay | Admitting: Internal Medicine

## 2022-12-26 ENCOUNTER — Ambulatory Visit: Payer: Medicare HMO | Admitting: Internal Medicine

## 2022-12-27 DIAGNOSIS — H52222 Regular astigmatism, left eye: Secondary | ICD-10-CM | POA: Diagnosis not present

## 2022-12-27 DIAGNOSIS — H5213 Myopia, bilateral: Secondary | ICD-10-CM | POA: Diagnosis not present

## 2023-01-01 ENCOUNTER — Ambulatory Visit: Payer: Medicare HMO | Admitting: Dermatology

## 2023-01-29 ENCOUNTER — Telehealth: Payer: Self-pay

## 2023-01-29 NOTE — Telephone Encounter (Signed)
Reached out to pt via Mychart about Novo nordisk, Ozempic re enrollment for 2025. Updated in spreadsheet.

## 2023-01-30 ENCOUNTER — Other Ambulatory Visit (HOSPITAL_COMMUNITY): Payer: Self-pay

## 2023-01-30 NOTE — Telephone Encounter (Signed)
PAP: Application for Ozempic has been submitted to PAP Companies: NovoNordisk, Therapist, sports

## 2023-02-14 NOTE — Telephone Encounter (Signed)
Error

## 2023-02-24 IMAGING — MG MM DIGITAL SCREENING BILAT W/ TOMO AND CAD
8 series · 8 of 24 positions shown · non-contrast
Comparison: Previous exam(s).

CLINICAL DATA: Screening.

EXAM:
DIGITAL SCREENING BILATERAL MAMMOGRAM WITH TOMOSYNTHESIS AND CAD
TECHNIQUE: Bilateral screening digital craniocaudal and mediolateral oblique
mammograms were obtained. Bilateral screening digital breast
tomosynthesis was performed. The images were evaluated with
computer-aided detection.

[R MLO synth-2D]
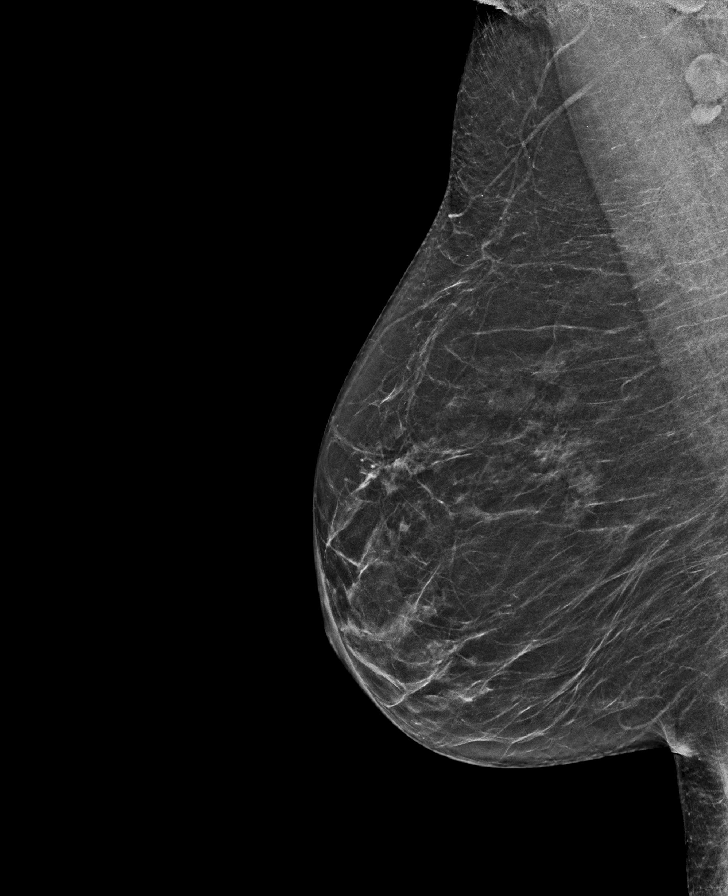

[L MLO synth-2D]
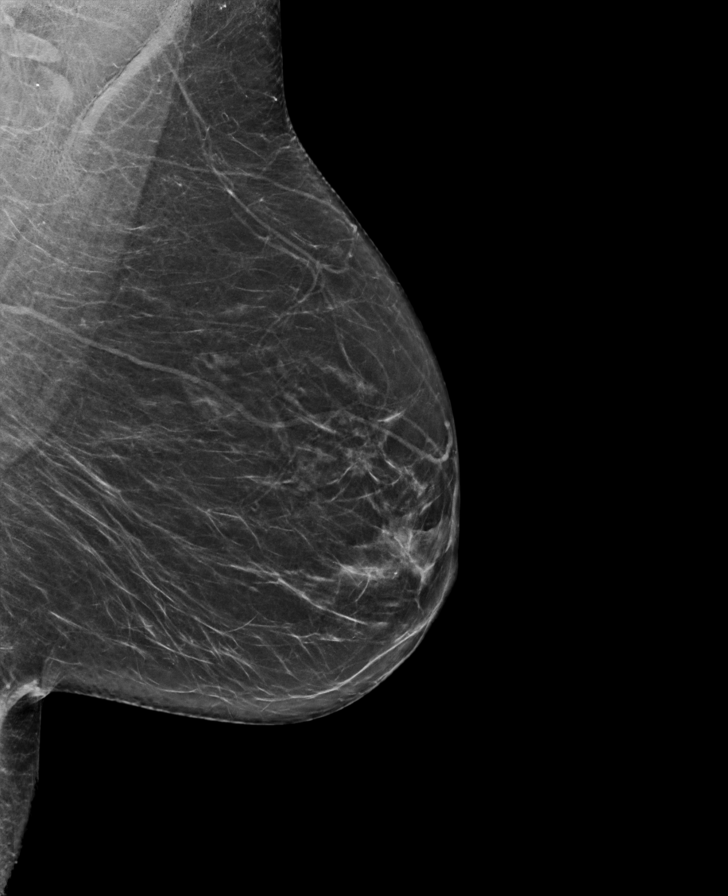

[R CC synth-2D]
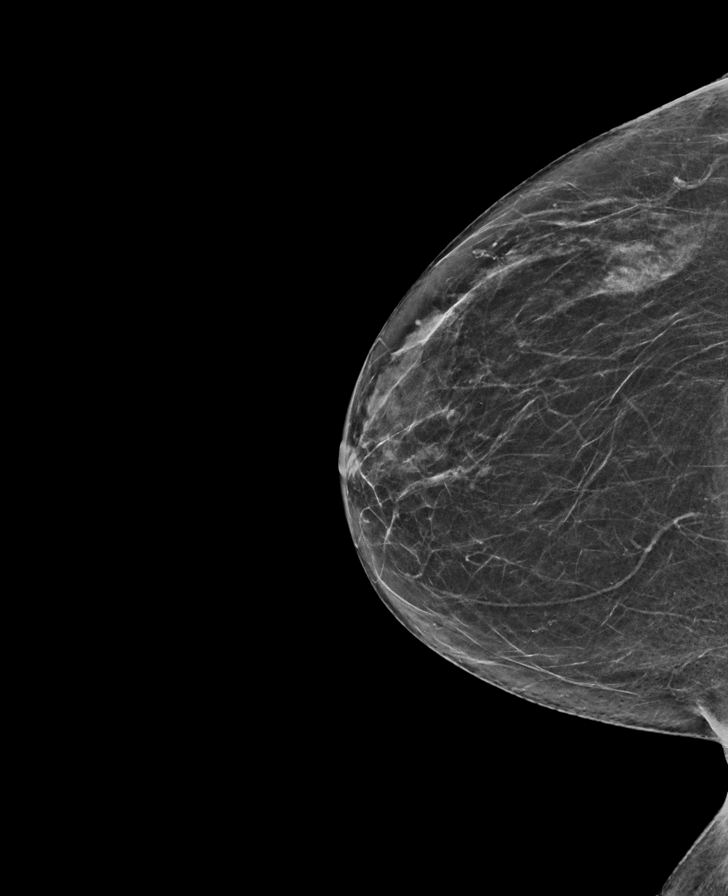

[L CC synth-2D]
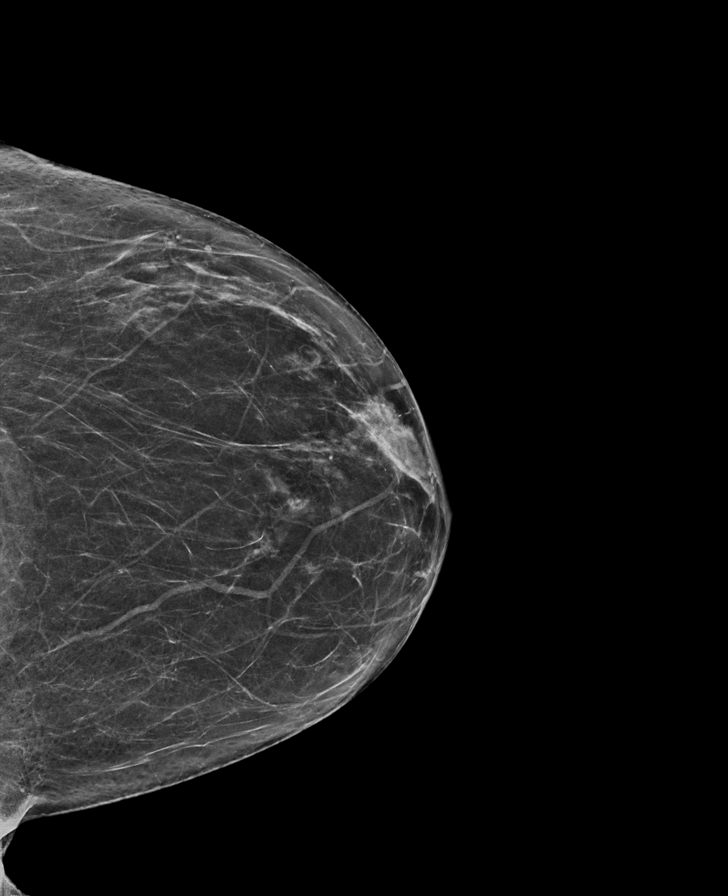

[L MLO tomo · tomo slice 33/66.0]
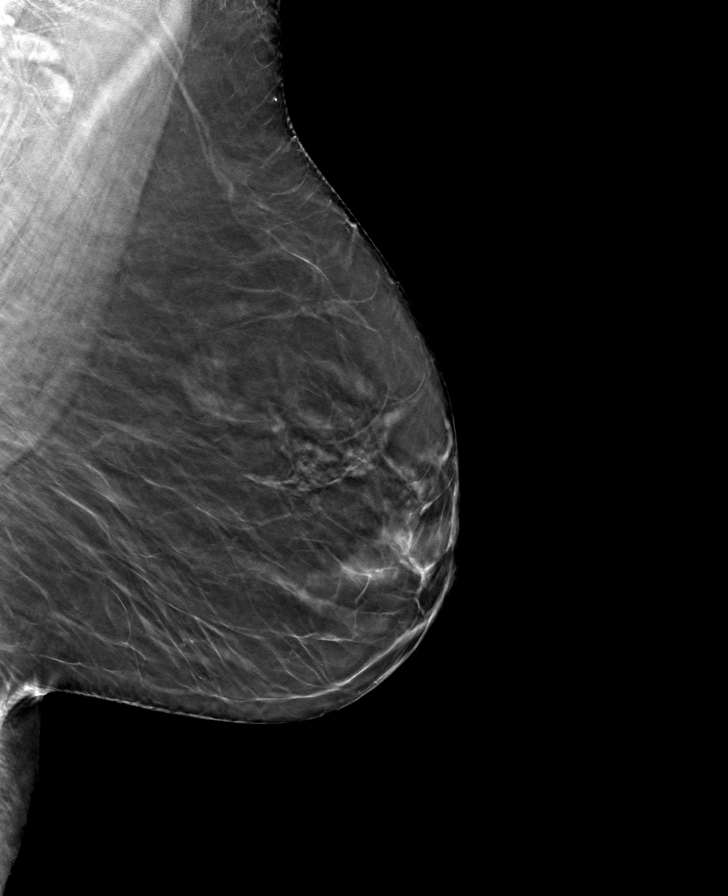

[R MLO tomo · tomo slice 31/61.0]
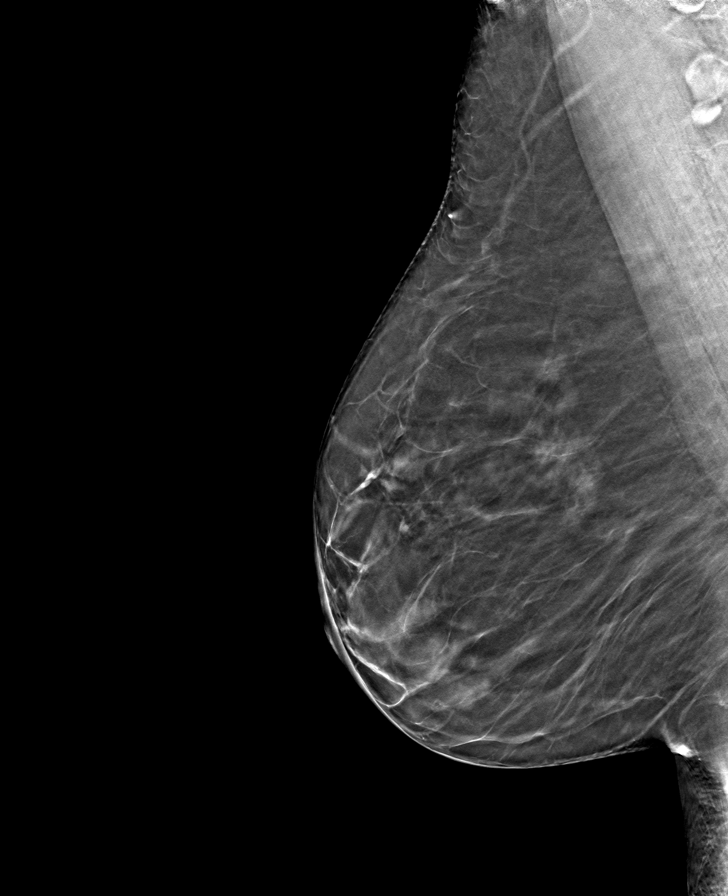

[R CC tomo · tomo slice 31/60.0]
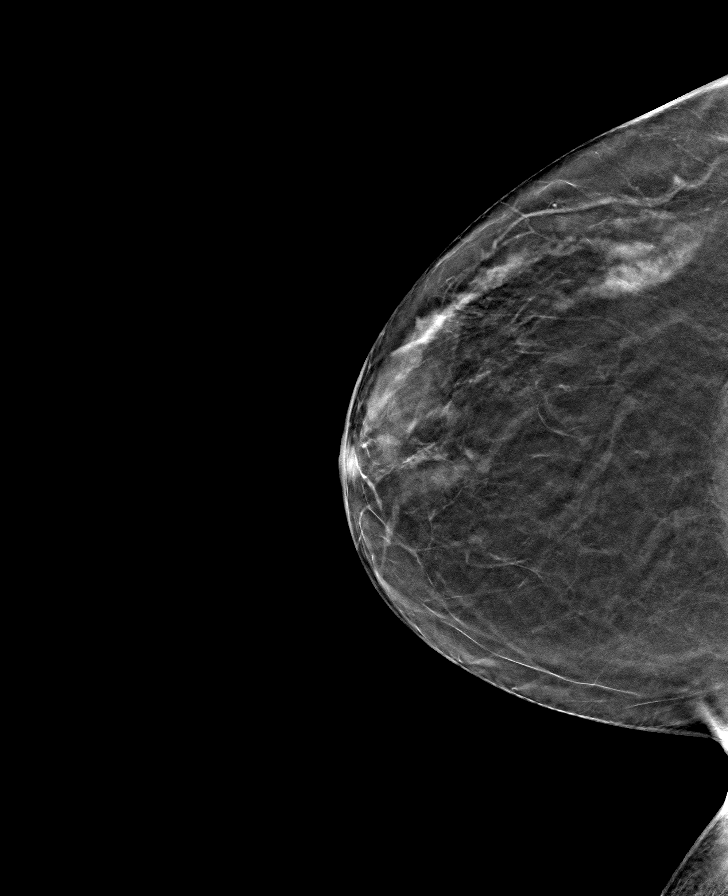

[L CC tomo · tomo slice 31/61.0]
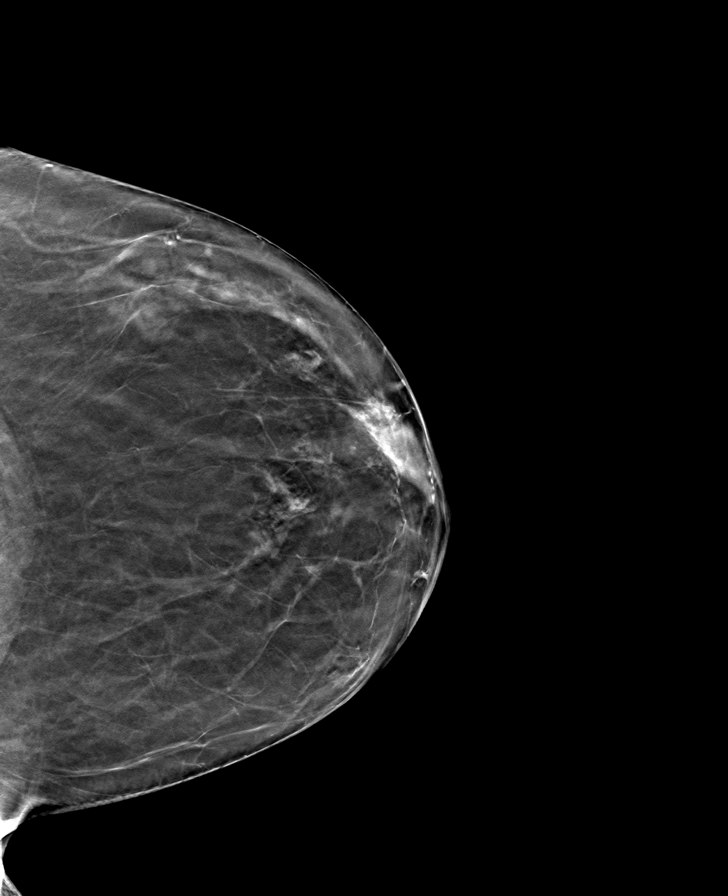

[8 of 24 positions shown; findings below may reference images not displayed]

ACR Breast Density Category c: The breast tissue is heterogeneously
dense, which may obscure small masses.
FINDINGS: There are no findings suspicious for malignancy.
IMPRESSION: No mammographic evidence of malignancy. A result letter of this
screening mammogram will be mailed directly to the patient.

RECOMMENDATION:
Screening mammogram in one year. (Code:Q3-W-BC3)

BI-RADS CATEGORY  1: Negative.

## 2023-02-25 ENCOUNTER — Other Ambulatory Visit: Payer: Self-pay | Admitting: Internal Medicine

## 2023-03-10 DIAGNOSIS — M654 Radial styloid tenosynovitis [de Quervain]: Secondary | ICD-10-CM | POA: Diagnosis not present

## 2023-03-10 DIAGNOSIS — M1812 Unilateral primary osteoarthritis of first carpometacarpal joint, left hand: Secondary | ICD-10-CM | POA: Diagnosis not present

## 2023-04-24 ENCOUNTER — Ambulatory Visit: Payer: Medicare HMO | Admitting: Nurse Practitioner

## 2023-04-24 ENCOUNTER — Encounter: Payer: Self-pay | Admitting: Nurse Practitioner

## 2023-04-24 VITALS — BP 118/80 | HR 81 | Temp 98.2°F | Ht 60.0 in | Wt 145.0 lb

## 2023-04-24 DIAGNOSIS — R5383 Other fatigue: Secondary | ICD-10-CM

## 2023-04-24 DIAGNOSIS — R1030 Lower abdominal pain, unspecified: Secondary | ICD-10-CM | POA: Diagnosis not present

## 2023-04-24 LAB — POC URINALSYSI DIPSTICK (AUTOMATED)
Bilirubin, UA: NEGATIVE
Blood, UA: NEGATIVE
Glucose, UA: NEGATIVE
Ketones, UA: NEGATIVE
Nitrite, UA: NEGATIVE
Protein, UA: NEGATIVE
Spec Grav, UA: 1.01 (ref 1.010–1.025)
Urobilinogen, UA: 0.2 U/dL
pH, UA: 6 (ref 5.0–8.0)

## 2023-04-24 NOTE — Progress Notes (Unsigned)
Established Patient Office Visit  Subjective:  Patient ID: Katherine Jimenez, female    DOB: 31-Jan-1951  Age: 73 y.o. MRN: [id number]  CC:  Chief Complaint  Patient presents with   Acute Visit    On 04/20/23 lower abdominal pain, severe thirst & tired   Discussed the use of a AI scribe software for clinical note transcription with the patient, who gave verbal consent to proceed.  HPI  Katherine Jimenez presents for acute visit.  She presented with complaints of extreme fatigue, thirst  and lower abdominal pain since returning from a cruise. The patient noted that they had been eating and drinking normally. They also mentioned that they had been on a cruise where they had indulged in eating and had gained three pounds. They also reported significant walking during the trip.  They also reported cloudy urine, which they initially suspected to be a bladder infection, but noted that the urine had cleared up by the day of the consultation. The patient's blood sugar was measured at 145 in the morning. Despite adequate rest and sleep, the patient continued to feel unwell, with occasional aches, which they attributed to their arthritis. They denied any nausea, vomiting, diarrhea, urinary frequency, urgency, or dysuria.  They mentioned a history of anemia and were on B12 supplements, which they had not taken for a while.   HPI   Past Medical History:  Diagnosis Date   Arthritis    back, knees, fingers   Bipolar disorder (HCC)    Bunion, left    Depression    Diabetes mellitus    diet controlled.    Elevated liver enzymes 05/31/2021   Hypertension    Lab test positive for detection of COVID-19 virus 03/02/2021   Lower back pain    S/P fall   Squamous cell skin cancer, face    [facility]   Treadmill stress test negative for angina pectoris June 2013    Past Surgical History:  Procedure Laterality Date   BLADDER AUGMENTATION     BLADDER SURGERY     CARDIAC CATHETERIZATION  03/05/12   no  stents   CATARACT EXTRACTION W/PHACO Right 07/19/2015   Procedure: CATARACT EXTRACTION PHACO AND INTRAOCULAR LENS PLACEMENT (IOC) right eye;  Surgeon: Sherald Hess, MD;  Location: [facility];  Service: Ophthalmology;  Laterality: Right;  BORDERLINE DIABETIC - oral meds   CATARACT EXTRACTION W/PHACO Left 05/28/2017   Procedure: CATARACT EXTRACTION PHACO AND INTRAOCULAR LENS PLACEMENT (IOC) LEFT DIABETIC;  Surgeon: Lockie Mola, MD;  Location: [facility];  Service: Ophthalmology;  Laterality: Left;  Diabetic - oal meds   JOINT REPLACEMENT  2012   bilateral hip   KNEE ARTHROSCOPY     TOTAL HIP ARTHROPLASTY  2012   TOTAL KNEE ARTHROPLASTY Right 06/04/2017   Procedure: TOTAL KNEE ARTHROPLASTY;  Surgeon: Lyndle Herrlich, MD;  Location: [facility];  Service: Orthopedics;  Laterality: Right;   TUBAL LIGATION     VAGINAL DELIVERY     2    Family History  Problem Relation Age of Onset   Heart disease Mother    Stroke Mother    Heart disease Father    Dementia Father    Heart disease Maternal Grandmother    Heart disease Maternal Grandfather    Heart disease Paternal Grandmother    Heart disease Paternal Grandfather    Breast cancer Maternal Aunt     Social History   Socioeconomic History   Marital status: Divorced  Spouse name: Not on file   Number of children: Not on file   Years of education: Not on file   Highest education level: Not on file  Occupational History   Not on file  Tobacco Use   Smoking status: Never   Smokeless tobacco: Never  Vaping Use   Vaping status: Never Used  Substance and Sexual Activity   Alcohol use: Not Currently    Alcohol/week: 1.0 standard drink of alcohol    Types: 1 Cans of beer per week    Comment: occassionally   Drug use: No   Sexual activity: Not on file  Other Topics Concern   Not on file  Social History Narrative   ** Merged History Encounter **       Lives in [place] with husband.          Social Drivers of Corporate investment banker Strain: Low Risk  (08/09/2022)   Overall Financial Resource Strain (CARDIA)    Difficulty of Paying Living Expenses: Not hard at all  Food Insecurity: No Food Insecurity (08/09/2022)   Hunger Vital Sign    Worried About Running Out of Food in the Last Year: Never true    Ran Out of Food in the Last Year: Never true  Transportation Needs: No Transportation Needs (08/09/2022)   PRAPARE - Administrator, Civil Service (Medical): No    Lack of Transportation (Non-Medical): No  Physical Activity: Sufficiently Active (08/09/2022)   Exercise Vital Sign    Days of Exercise per Week: 7 days    Minutes of Exercise per Session: 30 min  Stress: No Stress Concern Present (08/09/2022)   [employer] of Occupational Health - Occupational Stress Questionnaire    Feeling of Stress : Not at all  Social Connections: Moderately Integrated (08/09/2022)   Social Connection and Isolation Panel [NHANES]    Frequency of Communication with Friends and Family: More than three times a week    Frequency of Social Gatherings with Friends and Family: More than three times a week    Attends Religious Services: More than 4 times per year    Active Member of [employer] or Organizations: Yes    Attends Engineer, structural: More than 4 times per year    Marital Status: Divorced  Intimate Partner Violence: Not At Risk (12/14/2020)   Humiliation, Afraid, Rape, and Kick questionnaire    Fear of Current or Ex-Partner: No    Emotionally Abused: No    Physically Abused: No    Sexually Abused: No     Outpatient Medications Prior to Visit  Medication Sig Dispense Refill   acetaminophen (TYLENOL) 325 MG tablet Take 650 mg by mouth every 6 (six) hours as needed.     Biotin 5 MG CAPS Take 2 capsules by mouth 2 (two) times daily.      blood glucose meter kit and supplies Dispense Contour next bayer meter. E11.9 To check blood glucose  Once a day. 1 each 0    busPIRone (BUSPAR) 30 MG tablet Take 1 tablet (30 mg total) by mouth in the morning and at bedtime. 180 tablet 1   Cholecalciferol (VITAMIN D3) 1000 units CAPS Take 1,000 Units by mouth daily.      cyanocobalamin (VITAMIN B12) 1000 MCG/ML injection Inject 1 mL (1,000 mcg total) into the muscle every 30 (thirty) days. 3 mL 4   diphenhydramine-acetaminophen (TYLENOL PM) 25-500 MG TABS tablet Take 1 tablet by mouth at bedtime as needed.  DULoxetine (CYMBALTA) 60 MG capsule Take 1 capsule (60 mg total) by mouth daily. 90 capsule 1   gabapentin (NEURONTIN) 100 MG capsule TAKE ONE CAPSULE BY MOUTH THREE TIMES A DAY 90 capsule 2   GLUCOSAMINE HCL PO Take 1 capsule by mouth daily.     glucose blood (FREESTYLE LITE) test strip Use once daily  as instructed to test blood sugar E 11.9 100 each 3   lamoTRIgine (LAMICTAL) 200 MG tablet Take 1 tablet (200 mg total) by mouth 2 (two) times daily. 180 tablet 1   Lancets (FREESTYLE) lancets Use once daily to check blood sugars  E11.9 100 each 3   losartan (COZAAR) 50 MG tablet TAKE 1 TABLET BY MOUTH DAILY 90 tablet 1   naproxen sodium (ALEVE) 220 MG tablet Take 220 mg by mouth daily as needed.     nitroGLYCERIN (NITROSTAT) 0.4 MG SL tablet Place 1 tablet (0.4 mg total) under the tongue every 5 (five) minutes as needed for chest pain. 50 tablet 3   omeprazole (PRILOSEC) 40 MG capsule TAKE 1 CAPSULE BY MOUTH DAILY 90 capsule 3   Semaglutide,0.25 or 0.5MG /DOS, (OZEMPIC, 0.25 OR 0.5 MG/DOSE,) 2 MG/1.5ML SOPN Inject 0.5 mg into the skin once a week. 4.5 mL 2   Syringe, Disposable, 1 ML MISC For use with B12 injections 25 each 3   Syringe/Needle, Disp, (SYRINGE 3CC/25GX1") 25G X 1" 3 ML MISC Use for b12 injections 50 each 0   tretinoin (RETIN-A) 0.05 % cream Apply pea-sized amount to face at bedtime, wash off in morning. 20 g 3   zolpidem (AMBIEN) 10 MG tablet TAKE 1 TABLET BY MOUTH EVERY NIGHT AT BEDTIME AS NEEDED FOR SLEEP 30 tablet 2   No facility-administered  medications prior to visit.    Allergies  Allergen Reactions   Atorvastatin     Liver enzyme elevation    Celebrex [Celecoxib] Other (See Comments)    Liver enzyme elevation    Thimerosal (Thiomersal) Itching    ROS Review of Systems Negative unless indicated in HPI.    Objective:    Physical Exam Constitutional:      Appearance: Normal appearance.  HENT:     Head: Normocephalic.     Mouth/Throat:     Mouth: Mucous membranes are moist.  Cardiovascular:     Rate and Rhythm: Normal rate and regular rhythm.     Pulses: Normal pulses.     Heart sounds: Normal heart sounds.  Abdominal:     General: Bowel sounds are normal.     Palpations: Abdomen is soft.     Tenderness: There is abdominal tenderness in the suprapubic area. There is no right CVA tenderness or left CVA tenderness.  Musculoskeletal:     Cervical back: Normal range of motion.  Skin:    General: Skin is warm.  Neurological:     General: No focal deficit present.     Mental Status: She is alert. Mental status is at baseline.  Psychiatric:        Mood and Affect: Mood normal.        Behavior: Behavior normal.        Thought Content: Thought content normal.        Judgment: Judgment normal.     BP 118/80   Pulse 81   Temp 98.2 F (36.8 C)   Ht 5' (1.524 m)   Wt 145 lb (65.8 kg)   SpO2 98%   BMI 28.32 kg/m  Wt Readings from Last 3  Encounters:  04/24/23 145 lb (65.8 kg)  10/17/22 142 lb (64.4 kg)  08/09/22 141 lb (64 kg)     Health Maintenance  Topic Date Due   Pneumonia Vaccine 61+ Years old (3 of 3 - PPSV23 or PCV20) 04/21/2020   HEMOGLOBIN A1C  03/21/2023   COVID-19 Vaccine (4 - 2024-25 season) 05/09/2023 (Originally 12/03/2022)   INFLUENZA VACCINE  07/02/2023 (Originally 11/02/2022)   MAMMOGRAM  07/14/2023   FOOT EXAM  07/18/2023   Medicare Annual Wellness (AWV)  08/09/2023   OPHTHALMOLOGY EXAM  09/01/2023   Diabetic kidney evaluation - Urine ACR  10/17/2023   Diabetic kidney evaluation -  eGFR measurement  04/23/2024   Colonoscopy  01/17/2026   DTaP/Tdap/Td (2 - Td or Tdap) 11/19/2028   DEXA SCAN  Completed   Hepatitis C Screening  Completed   Zoster Vaccines- Shingrix  Completed   HPV VACCINES  Aged Out    There are no preventive care reminders to display for this patient.  Lab Results  Component Value Date   TSH 1.14 09/19/2022   Lab Results  Component Value Date   WBC 8.5 04/24/2023   HGB 10.8 (L) 04/24/2023   HCT 34.2 (L) 04/24/2023   MCV 77.7 (L) 04/24/2023   PLT 404.0 (H) 04/24/2023   Lab Results  Component Value Date   NA 137 04/24/2023   K 4.2 04/24/2023   CO2 29 04/24/2023   GLUCOSE 75 04/24/2023   BUN 12 04/24/2023   CREATININE 0.79 04/24/2023   BILITOT 0.3 04/24/2023   ALKPHOS 78 04/24/2023   AST 14 04/24/2023   ALT 10 04/24/2023   PROT 7.4 04/24/2023   ALBUMIN 4.6 04/24/2023   CALCIUM 9.5 04/24/2023   ANIONGAP 12 08/06/2021   GFR 75.38 04/24/2023   Lab Results  Component Value Date   CHOL 220 (H) 09/19/2022   Lab Results  Component Value Date   HDL 91.10 09/19/2022   Lab Results  Component Value Date   LDLCALC 108 (H) 09/19/2022   Lab Results  Component Value Date   TRIG 106.0 09/19/2022   Lab Results  Component Value Date   CHOLHDL 2 09/19/2022   Lab Results  Component Value Date   HGBA1C 6.0 09/19/2022      Assessment & Plan:  Lower abdominal pain Assessment & Plan: Patient reported cloudy urine and lower abdominal discomfort, but no dysuria, frequency, or urgency. Urinalysis showed trace leukocytes. -Send urine for culture to rule out infection.    Orders: -     POCT Urinalysis Dipstick (Automated) -     CBC with Differential/Platelet -     Comprehensive metabolic panel -     Urine Culture -     Urine Microscopic  Other fatigue Assessment & Plan: She reports increased fatigue and thirst since returning from a trip. No other symptoms of hyperglycemia. -Order labs including CBC, CMP -Advise patient to  continue monitoring blood glucose levels and to maintain hydration.     Follow-up: Return if symptoms worsen or fail to improve.   Kara Dies, NP

## 2023-04-25 ENCOUNTER — Encounter: Payer: Self-pay | Admitting: Nurse Practitioner

## 2023-04-25 ENCOUNTER — Other Ambulatory Visit: Payer: Self-pay | Admitting: Nurse Practitioner

## 2023-04-25 DIAGNOSIS — R1030 Lower abdominal pain, unspecified: Secondary | ICD-10-CM | POA: Insufficient documentation

## 2023-04-25 DIAGNOSIS — R5383 Other fatigue: Secondary | ICD-10-CM | POA: Insufficient documentation

## 2023-04-25 DIAGNOSIS — D649 Anemia, unspecified: Secondary | ICD-10-CM

## 2023-04-25 LAB — COMPREHENSIVE METABOLIC PANEL
ALT: 10 U/L (ref 0–35)
AST: 14 U/L (ref 0–37)
Albumin: 4.6 g/dL (ref 3.5–5.2)
Alkaline Phosphatase: 78 U/L (ref 39–117)
BUN: 12 mg/dL (ref 6–23)
CO2: 29 meq/L (ref 19–32)
Calcium: 9.5 mg/dL (ref 8.4–10.5)
Chloride: 101 meq/L (ref 96–112)
Creatinine, Ser: 0.79 mg/dL (ref 0.40–1.20)
GFR: 75.38 mL/min (ref >60.00)
Glucose, Bld: 75 mg/dL (ref 70–99)
Potassium: 4.2 meq/L (ref 3.5–5.1)
Sodium: 137 meq/L (ref 135–145)
Total Bilirubin: 0.3 mg/dL (ref 0.2–1.2)
Total Protein: 7.4 g/dL (ref 6.0–8.3)

## 2023-04-25 LAB — CBC WITH DIFFERENTIAL/PLATELET
Basophils Absolute: 0.1 10*3/uL (ref 0.0–0.1)
Basophils Relative: 0.8 % (ref 0.0–3.0)
Eosinophils Absolute: 0.1 10*3/uL (ref 0.0–0.7)
Eosinophils Relative: 1.4 % (ref 0.0–5.0)
HCT: 34.2 % — ABNORMAL LOW (ref 36.0–46.0)
Hemoglobin: 10.8 g/dL — ABNORMAL LOW (ref 12.0–15.0)
Lymphocytes Relative: 33.5 % (ref 12.0–46.0)
Lymphs Abs: 2.8 10*3/uL (ref 0.7–4.0)
MCHC: 31.4 g/dL (ref 30.0–36.0)
MCV: 77.7 fL — ABNORMAL LOW (ref 78.0–100.0)
Monocytes Absolute: 0.7 10*3/uL (ref 0.1–1.0)
Monocytes Relative: 8.8 % (ref 3.0–12.0)
Neutro Abs: 4.7 10*3/uL (ref 1.4–7.7)
Neutrophils Relative %: 55.5 % (ref 43.0–77.0)
Platelets: 404 10*3/uL — ABNORMAL HIGH (ref 150.0–400.0)
RBC: 4.4 Mil/uL (ref 3.87–5.11)
RDW: 17.9 % — ABNORMAL HIGH (ref 11.5–15.5)
WBC: 8.5 10*3/uL (ref 4.0–10.5)

## 2023-04-25 LAB — URINALYSIS, MICROSCOPIC ONLY: RBC / HPF: NONE SEEN (ref 0–?)

## 2023-04-25 LAB — URINE CULTURE
MICRO NUMBER:: 15981755
Result:: NO GROWTH
SPECIMEN QUALITY:: ADEQUATE

## 2023-04-25 NOTE — Assessment & Plan Note (Signed)
Patient reported cloudy urine and lower abdominal discomfort, but no dysuria, frequency, or urgency. Urinalysis showed trace leukocytes. -Send urine for culture to rule out infection.

## 2023-04-25 NOTE — Assessment & Plan Note (Signed)
She reports increased fatigue and thirst since returning from a trip. No other symptoms of hyperglycemia. -Order labs including CBC, CMP -Advise patient to continue monitoring blood glucose levels and to maintain hydration.

## 2023-04-27 DIAGNOSIS — M654 Radial styloid tenosynovitis [de Quervain]: Secondary | ICD-10-CM | POA: Diagnosis not present

## 2023-05-22 ENCOUNTER — Other Ambulatory Visit: Payer: Medicare HMO

## 2023-05-23 ENCOUNTER — Other Ambulatory Visit: Payer: Medicare HMO

## 2023-05-25 ENCOUNTER — Other Ambulatory Visit: Payer: Self-pay | Admitting: Internal Medicine

## 2023-05-28 ENCOUNTER — Other Ambulatory Visit: Payer: Self-pay | Admitting: Internal Medicine

## 2023-05-28 ENCOUNTER — Telehealth: Payer: Self-pay

## 2023-05-28 NOTE — Telephone Encounter (Signed)
 Copied from CRM [id number]. Topic: Clinical - Medication Question >> May 28, 2023  3:34 PM [place] E wrote: Reason for CRM: Patient called in regarding her zolpidem (AMBIEN) 10 MG tablet medication. Patient stated how she sent in a refill request and that she needed an appointment with Dr. Darrick Huntsman first in order for this request to be approved. Patient stated that she wants to make that appointment tomorrow 2/25 when she comes in for lab work. However, patient only has one pill left of this medication and wondering if refill request can be made before her appointment.

## 2023-05-28 NOTE — Telephone Encounter (Signed)
 Last Fill: 02/26/23 30 tabs/ 2 refills  Last OV: 04/24/23 Next OV: None Scheduled  Routing to provider for review/authorization.

## 2023-05-28 NOTE — Telephone Encounter (Unsigned)
 Copied from CRM [id number]. Topic: Clinical - Medication Refill >> May 28, 2023 10:25 AM Irine Seal wrote: Most Recent Primary Care Visit:  Provider: Kara Dies  Department: [facility]  Visit Type: ACUTE  Date: 04/24/2023  Medication: zolpidem (AMBIEN) 10 MG tablet  Has the patient contacted their pharmacy? Yes (Agent: If no, request that the patient contact the pharmacy for the refill. If patient does not wish to contact the pharmacy document the reason why and proceed with request.) (Agent: If yes, when and what did the pharmacy advise?) no answer at pharmacy left a voicemail and never got a callback   Is this the correct pharmacy for this prescription? Yes If no, delete pharmacy and type the correct one.  This is the patient's preferred pharmacy:   [facility] [id number] [place], [place] - [address] [place] [address] Phone: [phone] Fax: [phone]    Has the prescription been filled recently? No  Is the patient out of the medication? Yes 1 tablet   Has the patient been seen for an appointment in the last year OR does the patient have an upcoming appointment? Yes  Can we respond through MyChart? Yes  Agent: Please be advised that Rx refills may take up to 3 business days. We ask that you follow-up with your pharmacy.

## 2023-05-29 ENCOUNTER — Other Ambulatory Visit: Payer: Self-pay | Admitting: Internal Medicine

## 2023-05-29 ENCOUNTER — Other Ambulatory Visit (INDEPENDENT_AMBULATORY_CARE_PROVIDER_SITE_OTHER): Payer: Medicare HMO

## 2023-05-29 DIAGNOSIS — D649 Anemia, unspecified: Secondary | ICD-10-CM

## 2023-05-29 LAB — IBC + FERRITIN
Ferritin: 4.1 ng/mL — ABNORMAL LOW (ref 10.0–291.0)
Iron: 35 ug/dL — ABNORMAL LOW (ref 42–145)
Saturation Ratios: 6.4 % — ABNORMAL LOW (ref 20.0–50.0)
TIBC: 548.8 ug/dL — ABNORMAL HIGH (ref 250.0–450.0)
Transferrin: 392 mg/dL — ABNORMAL HIGH (ref 212.0–360.0)

## 2023-05-29 MED ORDER — ZOLPIDEM TARTRATE 10 MG PO TABS: 10.0000 mg | ORAL_TABLET | Freq: Every evening | ORAL | 0 refills | Status: DC | PRN

## 2023-05-29 NOTE — Telephone Encounter (Signed)
 Spoke with pt and scheduled her for an appt on 06/11/2023. Pt is out of of her Zolpidem and would like to know if she could get it refilled until her appt.   Last OV: 10/17/2022 Next OV: 06/11/2023 Last refill: 02/26/2023

## 2023-05-29 NOTE — Addendum Note (Signed)
 Addended by: Sherlene Shams on: 05/29/2023 11:10 AM   Modules accepted: Orders

## 2023-05-29 NOTE — Telephone Encounter (Signed)
Duplicate request - Refilled today.

## 2023-05-29 NOTE — Telephone Encounter (Signed)
 Pt is aware and was advised that she will need to keep her appt on 06/11/2023.

## 2023-06-05 ENCOUNTER — Telehealth: Payer: Self-pay

## 2023-06-05 ENCOUNTER — Other Ambulatory Visit: Payer: Self-pay | Admitting: Nurse Practitioner

## 2023-06-05 ENCOUNTER — Encounter: Payer: Self-pay | Admitting: Nurse Practitioner

## 2023-06-05 MED ORDER — IRON (FERROUS SULFATE) 325 (65 FE) MG PO TABS: 325.0000 mg | ORAL_TABLET | Freq: Every day | ORAL | 1 refills | Status: DC

## 2023-06-05 NOTE — Telephone Encounter (Signed)
 LMTCB. Need to let pt know that we have received her patient assistance medication and it is ready for pick up.    Ozempic 0.25/0.5 mg: 4 boxes

## 2023-06-05 NOTE — Telephone Encounter (Signed)
 Left a message to call the office back or check her my chart message that Katherine Jimenez sent her and if she reads the my chart please let me know by my chart that she received the message and is agreeable to the Iron supplement and what to not take within 2 hours of taking the Iron. If Patient calls back please send call to [facility].

## 2023-06-05 NOTE — Progress Notes (Signed)
 Please inform pt: The iron reserve is on the lower side. I have send 325 mg of iron supplement to the pharmacy.  Do not take any antacid, milk products or caffeine drinks within 2 hours after taking the iron supplement.  You can take iron supplement with vitamin C like orange juice to increase the absorption.  Iron supplement can cause constipation so increase fiber and water intake.  Iron supplement often causes stool to appear dark green or black.

## 2023-06-05 NOTE — Telephone Encounter (Signed)
-----   Message from Kara Dies sent at 06/05/2023  5:19 AM EST ----- Please inform pt: The iron reserve is on the lower side. I have send 325 mg of iron supplement to the pharmacy.  Do not take any antacid, milk products or caffeine drinks within 2 hours after taking the iron supplement.  You can take iron supplement with vitamin C like orange juice to increase the absorption.  Iron supplement can cause constipation so increase fiber and water intake.  Iron supplement often causes stool to appear dark green or black.

## 2023-06-11 ENCOUNTER — Ambulatory Visit: Payer: Medicare HMO | Admitting: Internal Medicine

## 2023-06-11 ENCOUNTER — Encounter: Payer: Self-pay | Admitting: Internal Medicine

## 2023-06-11 VITALS — BP 116/74 | HR 105 | Ht 60.0 in | Wt 150.8 lb

## 2023-06-11 DIAGNOSIS — Z7985 Long-term (current) use of injectable non-insulin antidiabetic drugs: Secondary | ICD-10-CM | POA: Diagnosis not present

## 2023-06-11 DIAGNOSIS — Z7984 Long term (current) use of oral hypoglycemic drugs: Secondary | ICD-10-CM

## 2023-06-11 DIAGNOSIS — F01A4 Vascular dementia, mild, with anxiety: Secondary | ICD-10-CM | POA: Diagnosis not present

## 2023-06-11 DIAGNOSIS — E785 Hyperlipidemia, unspecified: Secondary | ICD-10-CM | POA: Diagnosis not present

## 2023-06-11 DIAGNOSIS — G720 Drug-induced myopathy: Secondary | ICD-10-CM | POA: Diagnosis not present

## 2023-06-11 DIAGNOSIS — I700 Atherosclerosis of aorta: Secondary | ICD-10-CM | POA: Diagnosis not present

## 2023-06-11 DIAGNOSIS — D508 Other iron deficiency anemias: Secondary | ICD-10-CM

## 2023-06-11 DIAGNOSIS — I10 Essential (primary) hypertension: Secondary | ICD-10-CM

## 2023-06-11 DIAGNOSIS — E118 Type 2 diabetes mellitus with unspecified complications: Secondary | ICD-10-CM | POA: Diagnosis not present

## 2023-06-11 DIAGNOSIS — E1169 Type 2 diabetes mellitus with other specified complication: Secondary | ICD-10-CM | POA: Diagnosis not present

## 2023-06-11 DIAGNOSIS — R195 Other fecal abnormalities: Secondary | ICD-10-CM

## 2023-06-11 DIAGNOSIS — T466X5A Adverse effect of antihyperlipidemic and antiarteriosclerotic drugs, initial encounter: Secondary | ICD-10-CM

## 2023-06-11 LAB — POCT GLYCOSYLATED HEMOGLOBIN (HGB A1C): Hemoglobin A1C: 5.9 % — AB (ref 4.0–5.6)

## 2023-06-11 MED ORDER — LOSARTAN POTASSIUM 50 MG PO TABS: 50.0000 mg | ORAL_TABLET | Freq: Every day | ORAL | 1 refills | Status: DC

## 2023-06-11 MED ORDER — LAMOTRIGINE 200 MG PO TABS: 200.0000 mg | ORAL_TABLET | Freq: Two times a day (BID) | ORAL | 1 refills | Status: DC

## 2023-06-11 MED ORDER — BUSPIRONE HCL 30 MG PO TABS: 30.0000 mg | ORAL_TABLET | Freq: Two times a day (BID) | ORAL | 1 refills | Status: DC

## 2023-06-11 MED ORDER — DULOXETINE HCL 60 MG PO CPEP: 60.0000 mg | ORAL_CAPSULE | Freq: Every day | ORAL | 1 refills | Status: DC

## 2023-06-11 NOTE — Assessment & Plan Note (Signed)
 Currently well-controlled on current medications . She has lost 12 lbs since October. . Patient is reminded to schedule an annual eye exam and foot exam is normal today. Patient has no microalbuminuria. Patient is intolerant of  statin therapy  due to drug induced  hepatitis with prior trial of atorvastatin 20 mg daily.Marland Kitchen  LDL is at goal and LFTs are normal.  Taking 50 mg losartan for BP.  No changes today continue metformin, ozempicand losartan   Lab Results  Component Value Date   HGBA1C 5.9 (A) 06/11/2023   Lab Results  Component Value Date   LABMICR See below: 03/17/2019   LABMICR See below: 02/03/2019   MICROALBUR 1.4 10/17/2022   MICROALBUR 1.0 07/04/2021    Lab Results  Component Value Date   CHOL 220 (H) 09/19/2022   HDL 91.10 09/19/2022   LDLCALC 108 (H) 09/19/2022   LDLDIRECT 112.0 10/13/2016   TRIG 106.0 09/19/2022   CHOLHDL 2 09/19/2022  . Lab Results  Component Value Date   ALT 10 04/24/2023   AST 14 04/24/2023   ALKPHOS 78 04/24/2023   BILITOT 0.3 04/24/2023

## 2023-06-11 NOTE — Assessment & Plan Note (Signed)
 Untreated due to  trial of  atorvastatin causing  liver enzyme elevatiion .

## 2023-06-11 NOTE — Progress Notes (Signed)
 Subjective:  Patient ID: Katherine Jimenez, female    DOB: Dec 06, 1950  Age: 73 y.o. MRN: [id number]  CC: The primary encounter diagnosis was Primary hypertension. Diagnoses of Hyperlipidemia associated with type 2 diabetes mellitus (HCC), DM type 2, controlled, with complication (HCC), Other iron deficiency anemia, Vascular dementia, mild, with anxiety [facility]), Thoracic aortic atherosclerosis (HCC), and Statin myopathy were also pertinent to this visit.   HPI Thornton Papas presents for  Chief Complaint  Patient presents with   Medical Management of Chronic Issues    IDA;    source unclear .  No gastritis symptoms,  no change in stools .  Chronic constipation,  averages 2 stools per week .  Does not take NSAID alcohol use.   5) Left knee pain :  had  6 months of improved pain after last steroid shot,  [facility] .  [facility]    Outpatient Medications Prior to Visit  Medication Sig Dispense Refill   acetaminophen (TYLENOL) 325 MG tablet Take 650 mg by mouth every 6 (six) hours as needed.     Biotin 5 MG CAPS Take 2 capsules by mouth 2 (two) times daily.      blood glucose meter kit and supplies Dispense Contour next bayer meter. E11.9 To check blood glucose  Once a day. 1 each 0   Cholecalciferol (VITAMIN D3) 1000 units CAPS Take 1,000 Units by mouth daily.      cyanocobalamin (VITAMIN B12) 1000 MCG/ML injection Inject 1 mL (1,000 mcg total) into the muscle every 30 (thirty) days. 3 mL 4   diphenhydramine-acetaminophen (TYLENOL PM) 25-500 MG TABS tablet Take 1 tablet by mouth at bedtime as needed.     gabapentin (NEURONTIN) 100 MG capsule TAKE ONE CAPSULE BY MOUTH THREE TIMES A DAY 90 capsule 2   GLUCOSAMINE HCL PO Take 1 capsule by mouth daily.     glucose blood (FREESTYLE LITE) test strip Use once daily  as instructed to test blood sugar E 11.9 100 each 3   Iron, Ferrous Sulfate, 325 (65 Fe) MG TABS Take 325 mg by mouth daily. 30 tablet 1   Lancets (FREESTYLE) lancets Use once  daily to check blood sugars  E11.9 100 each 3   naproxen sodium (ALEVE) 220 MG tablet Take 220 mg by mouth daily as needed.     nitroGLYCERIN (NITROSTAT) 0.4 MG SL tablet Place 1 tablet (0.4 mg total) under the tongue every 5 (five) minutes as needed for chest pain. 50 tablet 3   omeprazole (PRILOSEC) 40 MG capsule TAKE 1 CAPSULE BY MOUTH DAILY 90 capsule 3   Semaglutide,0.25 or 0.5MG /DOS, (OZEMPIC, 0.25 OR 0.5 MG/DOSE,) 2 MG/1.5ML SOPN Inject 0.5 mg into the skin once a week. 4.5 mL 2   Syringe, Disposable, 1 ML MISC For use with B12 injections 25 each 3   Syringe/Needle, Disp, (SYRINGE 3CC/25GX1") 25G X 1" 3 ML MISC Use for b12 injections 50 each 0   tretinoin (RETIN-A) 0.05 % cream Apply pea-sized amount to face at bedtime, wash off in morning. 20 g 3   zolpidem (AMBIEN) 10 MG tablet Take 1 tablet (10 mg total) by mouth at bedtime as needed. for sleep 30 tablet 0   busPIRone (BUSPAR) 30 MG tablet Take 1 tablet (30 mg total) by mouth in the morning and at bedtime. 180 tablet 1   DULoxetine (CYMBALTA) 60 MG capsule Take 1 capsule (60 mg total) by mouth daily. 90 capsule 1   lamoTRIgine (LAMICTAL) 200  MG tablet Take 1 tablet (200 mg total) by mouth 2 (two) times daily. 180 tablet 1   losartan (COZAAR) 50 MG tablet TAKE 1 TABLET BY MOUTH DAILY 90 tablet 1   No facility-administered medications prior to visit.    Review of Systems;  Patient denies headache, fevers, malaise, unintentional weight loss, skin rash, eye pain, sinus congestion and sinus pain, sore throat, dysphagia,  hemoptysis , cough, dyspnea, wheezing, chest pain, palpitations, orthopnea, edema, abdominal pain, nausea, melena, diarrhea, constipation, flank pain, dysuria, hematuria, urinary  Frequency, nocturia, numbness, tingling, seizures,  Focal weakness, Loss of consciousness,  Tremor, insomnia, depression, anxiety, and suicidal ideation.      Objective:  BP 116/74   Pulse (!) 105   Ht 5' (1.524 m)   Wt 150 lb 12.8 oz (68.4  kg)   SpO2 96%   BMI 29.45 kg/m   BP Readings from Last 3 Encounters:  06/11/23 116/74  04/24/23 118/80  10/17/22 128/66    Wt Readings from Last 3 Encounters:  06/11/23 150 lb 12.8 oz (68.4 kg)  04/24/23 145 lb (65.8 kg)  10/17/22 142 lb (64.4 kg)    Physical Exam Vitals reviewed.  Constitutional:      General: She is not in acute distress.    Appearance: Normal appearance. She is normal weight. She is not ill-appearing, toxic-appearing or diaphoretic.  HENT:     Head: Normocephalic.  Eyes:     General: No scleral icterus.       Right eye: No discharge.        Left eye: No discharge.     Conjunctiva/sclera: Conjunctivae normal.  Cardiovascular:     Rate and Rhythm: Normal rate and regular rhythm.     Heart sounds: Normal heart sounds.  Pulmonary:     Effort: Pulmonary effort is normal. No respiratory distress.     Breath sounds: Normal breath sounds.  Musculoskeletal:        General: Normal range of motion.  Skin:    General: Skin is warm and dry.  Neurological:     General: No focal deficit present.     Mental Status: She is alert and oriented to person, place, and time. Mental status is at baseline.  Psychiatric:        Mood and Affect: Mood normal.        Behavior: Behavior normal.        Thought Content: Thought content normal.        Judgment: Judgment normal.    Lab Results  Component Value Date   HGBA1C 5.9 (A) 06/11/2023   HGBA1C 6.0 09/19/2022   HGBA1C 6.0 05/30/2022    Lab Results  Component Value Date   CREATININE 0.79 04/24/2023   CREATININE 0.77 09/19/2022   CREATININE 0.68 03/10/2022    Lab Results  Component Value Date   WBC 8.5 04/24/2023   HGB 10.8 (L) 04/24/2023   HCT 34.2 (L) 04/24/2023   PLT 404.0 (H) 04/24/2023   GLUCOSE 75 04/24/2023   CHOL 220 (H) 09/19/2022   TRIG 106.0 09/19/2022   HDL 91.10 09/19/2022   LDLDIRECT 112.0 10/13/2016   LDLCALC 108 (H) 09/19/2022   ALT 10 04/24/2023   AST 14 04/24/2023   NA 137  04/24/2023   K 4.2 04/24/2023   CL 101 04/24/2023   CREATININE 0.79 04/24/2023   BUN 12 04/24/2023   CO2 29 04/24/2023   TSH 1.14 09/19/2022   INR 1.0 03/21/2021   HGBA1C 5.9 (A) 06/11/2023  MICROALBUR 1.4 10/17/2022    MM 3D SCREENING MAMMOGRAM BILATERAL BREAST Result Date: 07/17/2022 CLINICAL DATA:  Screening. EXAM: DIGITAL SCREENING BILATERAL MAMMOGRAM WITH TOMOSYNTHESIS AND CAD TECHNIQUE: Bilateral screening digital craniocaudal and mediolateral oblique mammograms were obtained. Bilateral screening digital breast tomosynthesis was performed. The images were evaluated with computer-aided detection. COMPARISON:  Previous exam(s). ACR Breast Density Category c: The breasts are heterogeneously dense, which may obscure small masses. FINDINGS: There are no findings suspicious for malignancy. IMPRESSION: No mammographic evidence of malignancy. A result letter of this screening mammogram will be mailed directly to the patient. RECOMMENDATION: Screening mammogram in one year. (Code:SM-B-01Y) BI-RADS CATEGORY  1: Negative. Electronically Signed   By: Annia Belt M.D.   On: 07/17/2022 13:00    Assessment & Plan:  .Primary hypertension  Hyperlipidemia associated with type 2 diabetes mellitus (HCC) Assessment & Plan: Currently well-controlled on current medications . She has lost 12 lbs since October. . Patient is reminded to schedule an annual eye exam and foot exam is normal today. Patient has no microalbuminuria. Patient is intolerant of  statin therapy  due to drug induced  hepatitis with prior trial of atorvastatin 20 mg daily.Marland Kitchen  LDL is at goal and LFTs are normal.  Taking 50 mg losartan for BP.  No changes today continue metformin, ozempicand losartan   Lab Results  Component Value Date   HGBA1C 5.9 (A) 06/11/2023   Lab Results  Component Value Date   LABMICR See below: 03/17/2019   LABMICR See below: 02/03/2019   MICROALBUR 1.4 10/17/2022   MICROALBUR 1.0 07/04/2021    Lab Results   Component Value Date   CHOL 220 (H) 09/19/2022   HDL 91.10 09/19/2022   LDLCALC 108 (H) 09/19/2022   LDLDIRECT 112.0 10/13/2016   TRIG 106.0 09/19/2022   CHOLHDL 2 09/19/2022  . Lab Results  Component Value Date   ALT 10 04/24/2023   AST 14 04/24/2023   ALKPHOS 78 04/24/2023   BILITOT 0.3 04/24/2023     Orders: -     Lipid panel  DM type 2, controlled, with complication (HCC) -     POCT glycosylated hemoglobin (Hb A1C) -     Microalbumin / creatinine urine ratio  Other iron deficiency anemia Assessment & Plan: Etiology unclear.  FOBT ordered , Referring to GI for EGD/colonoscopy   Lab Results  Component Value Date   IRON 35 (L) 05/29/2023   TIBC 548.8 (H) 05/29/2023   FERRITIN 4.1 (L) 05/29/2023    Lab Results  Component Value Date   WBC 8.5 04/24/2023   HGB 10.8 (L) 04/24/2023   HCT 34.2 (L) 04/24/2023   MCV 77.7 (L) 04/24/2023   PLT 404.0 (H) 04/24/2023     Orders: -     Fecal occult blood, imunochemical; Future -     Ambulatory referral to Gastroenterology -     Iron, TIBC and Ferritin Panel; Future -     CBC with Differential/Platelet; Future  Vascular dementia, mild, with anxiety [facility]) Assessment & Plan: Cognitive deficits have not progressed.  She has  not resumed  Namenda and  aricept    Thoracic aortic atherosclerosis (HCC) Assessment & Plan: Untreated due to  trial of  atorvastatin causing  liver enzyme elevatiion .     Statin myopathy Assessment & Plan: She had significant liver enzyme elevation with prior trial of atorvastatin    Other orders -     busPIRone HCl; Take 1 tablet (30 mg total) by mouth in the morning  and at bedtime.  Dispense: 180 tablet; Refill: 1 -     DULoxetine HCl; Take 1 capsule (60 mg total) by mouth daily.  Dispense: 90 capsule; Refill: 1 -     lamoTRIgine; Take 1 tablet (200 mg total) by mouth 2 (two) times daily.  Dispense: 180 tablet; Refill: 1 -     Losartan Potassium; Take 1 tablet (50 mg total) by mouth  daily.  Dispense: 90 tablet; Refill: 1     Follow-up: Return in about 6 months (around 12/12/2023) for follow up diabetes.   Sherlene Shams, MD

## 2023-06-11 NOTE — Assessment & Plan Note (Signed)
 She had significant liver enzyme elevation with prior trial of atorvastatin

## 2023-06-11 NOTE — Assessment & Plan Note (Signed)
 Etiology unclear.  FOBT ordered , Referring to GI for EGD/colonoscopy   Lab Results  Component Value Date   IRON 35 (L) 05/29/2023   TIBC 548.8 (H) 05/29/2023   FERRITIN 4.1 (L) 05/29/2023    Lab Results  Component Value Date   WBC 8.5 04/24/2023   HGB 10.8 (L) 04/24/2023   HCT 34.2 (L) 04/24/2023   MCV 77.7 (L) 04/24/2023   PLT 404.0 (H) 04/24/2023

## 2023-06-11 NOTE — Patient Instructions (Addendum)
 Your iron deficiency has caused you to become anemic.  Add 200 mg colace daily and start the iron supplement.  Once daily with orange juice.   We will repeat your CBC and iron in 6 weeks (labs only)   Fecal  tests for blood this week   Referral to Dr Servando Snare

## 2023-06-11 NOTE — Assessment & Plan Note (Signed)
 Cognitive deficits have not progressed.  She has  not resumed  Namenda and  aricept

## 2023-06-25 ENCOUNTER — Other Ambulatory Visit: Payer: Self-pay | Admitting: Internal Medicine

## 2023-06-26 NOTE — Telephone Encounter (Signed)
 Refilled: 05/29/2023 Last OV: 06/11/2023 Next OV: 12/13/2023

## 2023-06-27 DIAGNOSIS — M1712 Unilateral primary osteoarthritis, left knee: Secondary | ICD-10-CM | POA: Diagnosis not present

## 2023-06-27 DIAGNOSIS — M25562 Pain in left knee: Secondary | ICD-10-CM | POA: Diagnosis not present

## 2023-07-03 ENCOUNTER — Ambulatory Visit (INDEPENDENT_AMBULATORY_CARE_PROVIDER_SITE_OTHER)

## 2023-07-03 DIAGNOSIS — D508 Other iron deficiency anemias: Secondary | ICD-10-CM | POA: Diagnosis not present

## 2023-07-03 LAB — FECAL OCCULT BLOOD, IMMUNOCHEMICAL: Fecal Occult Bld: POSITIVE — AB

## 2023-07-05 ENCOUNTER — Encounter: Payer: Self-pay | Admitting: Internal Medicine

## 2023-07-05 NOTE — Addendum Note (Signed)
 Addended by: Sherlene Shams on: 07/05/2023 04:47 PM   Modules accepted: Orders

## 2023-07-17 ENCOUNTER — Telehealth: Payer: Self-pay

## 2023-07-17 NOTE — Telephone Encounter (Signed)
 Copied from CRM [id number]. Topic: Referral - Request for Referral >> Jul 17, 2023  4:41 PM Crispin Dolphin wrote: Did the patient discuss referral with their provider in the last year? Yes (If No - schedule appointment) (If Yes - send message)  Appointment offered? No  Type of order/referral and detailed reason for visit: GI specialist - the one she was referred to is leaving - he is hospitalitis and does not see patients   Preference of office, provider, location: Dr Mamie Searles with [facility]   If referral order, have you been seen by this specialty before? N/A - was previous referred but all new providers  (If Yes, this issue or another issue? When? Where?  Can we respond through MyChart? No

## 2023-07-23 ENCOUNTER — Other Ambulatory Visit (INDEPENDENT_AMBULATORY_CARE_PROVIDER_SITE_OTHER)

## 2023-07-23 ENCOUNTER — Other Ambulatory Visit

## 2023-07-23 DIAGNOSIS — D508 Other iron deficiency anemias: Secondary | ICD-10-CM | POA: Diagnosis not present

## 2023-07-23 NOTE — Addendum Note (Signed)
 Addended by: Thressa Flora D on: 07/23/2023 03:14 PM   Modules accepted: Orders

## 2023-07-24 ENCOUNTER — Encounter: Payer: Self-pay | Admitting: Internal Medicine

## 2023-07-24 DIAGNOSIS — D508 Other iron deficiency anemias: Secondary | ICD-10-CM

## 2023-07-24 LAB — CBC WITH DIFFERENTIAL/PLATELET
Absolute Lymphocytes: 3380 {cells}/uL (ref 850–3900)
Absolute Monocytes: 701 {cells}/uL (ref 200–950)
Basophils Absolute: 58 {cells}/uL (ref 0–200)
Basophils Relative: 0.8 %
Eosinophils Absolute: 183 {cells}/uL (ref 15–500)
Eosinophils Relative: 2.5 %
HCT: 35.1 % (ref 35.0–45.0)
Hemoglobin: 11.2 g/dL — ABNORMAL LOW (ref 11.7–15.5)
MCH: 26 pg — ABNORMAL LOW (ref 27.0–33.0)
MCHC: 31.9 g/dL — ABNORMAL LOW (ref 32.0–36.0)
MCV: 81.4 fL (ref 80.0–100.0)
MPV: 9.7 fL (ref 7.5–12.5)
Monocytes Relative: 9.6 %
Neutro Abs: 2978 {cells}/uL (ref 1500–7800)
Neutrophils Relative %: 40.8 %
Platelets: 359 10*3/uL (ref 140–400)
RBC: 4.31 10*6/uL (ref 3.80–5.10)
RDW: 18.1 % — ABNORMAL HIGH (ref 11.0–15.0)
Total Lymphocyte: 46.3 %
WBC: 7.3 10*3/uL (ref 3.8–10.8)

## 2023-07-24 LAB — IRON,TIBC AND FERRITIN PANEL
%SAT: 7 % — ABNORMAL LOW (ref 16–45)
Ferritin: 11 ng/mL — ABNORMAL LOW (ref 16–288)
Iron: 31 ug/dL — ABNORMAL LOW (ref 45–160)
TIBC: 437 ug/dL (ref 250–450)

## 2023-07-26 NOTE — Telephone Encounter (Signed)
 Spoke with pt and she stated that she would still like to switch to Dr. Mamie Searles at [facility]. I have pended the referral.

## 2023-08-13 ENCOUNTER — Ambulatory Visit (INDEPENDENT_AMBULATORY_CARE_PROVIDER_SITE_OTHER): Admitting: *Deleted

## 2023-08-13 VITALS — Ht 60.0 in | Wt 148.0 lb

## 2023-08-13 DIAGNOSIS — Z0000 Encounter for general adult medical examination without abnormal findings: Secondary | ICD-10-CM | POA: Diagnosis not present

## 2023-08-13 DIAGNOSIS — Z1231 Encounter for screening mammogram for malignant neoplasm of breast: Secondary | ICD-10-CM | POA: Diagnosis not present

## 2023-08-13 NOTE — Progress Notes (Signed)
 Subjective:   Katherine Jimenez is a 73 y.o. who presents for a Medicare Wellness preventive visit.  As a reminder, Annual Wellness Visits don't include a physical exam, and some assessments may be limited, especially if this visit is performed virtually. We may recommend an in-person visit if needed.  Visit Complete: Virtual I connected with  Katherine Jimenez on 08/13/23 by a audio enabled telemedicine application and verified that I am speaking with the correct person using two identifiers.  Patient Location: Home  Provider Location: Home Office  I discussed the limitations of evaluation and management by telemedicine. The patient expressed understanding and agreed to proceed.  Vital Signs: Because this visit was a virtual/telehealth visit, some criteria may be missing or patient reported. Any vitals not documented were not able to be obtained and vitals that have been documented are patient reported.  VideoDeclined- This patient declined Librarian, academic. Therefore the visit was completed with audio only.  Persons Participating in Visit: Patient.  AWV Questionnaire: Yes: Patient Medicare AWV questionnaire was completed by the patient on 08/12/23; I have confirmed that all information answered by patient is correct and no changes since this date.  Cardiac Risk Factors include: advanced age (>18men, >31 women);diabetes mellitus;dyslipidemia;hypertension     Objective:     Today's Vitals   08/13/23 0854  Weight: 148 lb (67.1 kg)  Height: 5' (1.524 m)   Body mass index is 28.9 kg/m.     08/13/2023    9:10 AM 08/09/2022    3:22 PM 08/06/2021   12:08 PM 03/21/2021    5:28 PM 03/18/2021   12:59 PM 12/14/2020    3:43 PM 12/03/2019    9:55 AM  Advanced Directives  Does Patient Have a Medical Advance Directive? No Yes No No No No No  Type of Furniture conservator/restorer;Living will       Copy of Healthcare Power of Attorney in Chart?   No - copy requested       Would patient like information on creating a medical advance directive? No - Patient declined     No - Patient declined No - Patient declined    Current Medications (verified) Outpatient Encounter Medications as of 08/13/2023  Medication Sig   acetaminophen  (TYLENOL ) 325 MG tablet Take 650 mg by mouth every 6 (six) hours as needed.   Biotin  5 MG CAPS Take 2 capsules by mouth 2 (two) times daily.    blood glucose meter kit and supplies Dispense Contour next bayer meter. E11.9 To check blood glucose  Once a day.   busPIRone  (BUSPAR ) 30 MG tablet Take 1 tablet (30 mg total) by mouth in the morning and at bedtime.   Cholecalciferol  (VITAMIN D3) 1000 units CAPS Take 1,000 Units by mouth daily.    cyanocobalamin  (VITAMIN B12) 1000 MCG/ML injection Inject 1 mL (1,000 mcg total) into the muscle every 30 (thirty) days.   diphenhydramine -acetaminophen  (TYLENOL  PM) 25-500 MG TABS tablet Take 1 tablet by mouth at bedtime as needed.   DULoxetine  (CYMBALTA ) 60 MG capsule Take 1 capsule (60 mg total) by mouth daily.   gabapentin  (NEURONTIN ) 100 MG capsule TAKE ONE CAPSULE BY MOUTH THREE TIMES A DAY   glucose blood (FREESTYLE LITE) test strip Use once daily  as instructed to test blood sugar E 11.9   lamoTRIgine  (LAMICTAL ) 200 MG tablet Take 1 tablet (200 mg total) by mouth 2 (two) times daily.   Lancets (FREESTYLE) lancets Use once daily  to check blood sugars  E11.9   losartan  (COZAAR ) 50 MG tablet Take 1 tablet (50 mg total) by mouth daily.   naproxen sodium (ALEVE) 220 MG tablet Take 220 mg by mouth daily as needed.   omeprazole  (PRILOSEC) 40 MG capsule TAKE 1 CAPSULE BY MOUTH DAILY   Semaglutide ,0.25 or 0.5MG /DOS, (OZEMPIC , 0.25 OR 0.5 MG/DOSE,) 2 MG/1.5ML SOPN Inject 0.5 mg into the skin once a week.   Syringe, Disposable, 1 ML MISC For use with B12 injections   Syringe/Needle, Disp, (SYRINGE 3CC/25GX1") 25G X 1" 3 ML MISC Use for b12 injections   zolpidem  (AMBIEN ) 10 MG tablet  TAKE 1 TABLET BY MOUTH AT BEDTIME AS NEEDED FOR SLEEP   GLUCOSAMINE HCL PO Take 1 capsule by mouth daily. (Patient not taking: Reported on 08/13/2023)   Iron , Ferrous Sulfate , 325 (65 Fe) MG TABS Take 325 mg by mouth daily. (Patient not taking: Reported on 08/13/2023)   nitroGLYCERIN  (NITROSTAT ) 0.4 MG SL tablet Place 1 tablet (0.4 mg total) under the tongue every 5 (five) minutes as needed for chest pain. (Patient not taking: Reported on 08/13/2023)   tretinoin  (RETIN-A ) 0.05 % cream Apply pea-sized amount to face at bedtime, wash off in morning. (Patient not taking: Reported on 08/13/2023)   No facility-administered encounter medications on file as of 08/13/2023.    Allergies (verified) Atorvastatin , Celebrex  [celecoxib ], and Thimerosal (thiomersal)   History: Past Medical History:  Diagnosis Date   Arthritis    back, knees, fingers   Bipolar disorder (HCC)    Bunion, left    Depression    Diabetes mellitus    diet controlled.    Elevated liver enzymes 05/31/2021   Hypertension    Lab test positive for detection of COVID-19 virus 03/02/2021   Lower back pain    S/P fall   Squamous cell skin cancer, face    [facility]   Treadmill stress test negative for angina pectoris June 2013   Past Surgical History:  Procedure Laterality Date   BLADDER AUGMENTATION     BLADDER SURGERY     CARDIAC CATHETERIZATION  03/05/12   no stents   CATARACT EXTRACTION W/PHACO Right 07/19/2015   Procedure: CATARACT EXTRACTION PHACO AND INTRAOCULAR LENS PLACEMENT (IOC) right eye;  Surgeon: Billee Buddle, MD;  Location: [facility];  Service: Ophthalmology;  Laterality: Right;  BORDERLINE DIABETIC - oral meds   CATARACT EXTRACTION W/PHACO Left 05/28/2017   Procedure: CATARACT EXTRACTION PHACO AND INTRAOCULAR LENS PLACEMENT (IOC) LEFT DIABETIC;  Surgeon: Annell Kidney, MD;  Location: [facility];  Service: Ophthalmology;  Laterality: Left;  Diabetic - oal meds   JOINT REPLACEMENT   2012   bilateral hip   KNEE ARTHROSCOPY     TOTAL HIP ARTHROPLASTY  2012   TOTAL KNEE ARTHROPLASTY Right 06/04/2017   Procedure: TOTAL KNEE ARTHROPLASTY;  Surgeon: Jerlyn Moons, MD;  Location: [facility];  Service: Orthopedics;  Laterality: Right;   TUBAL LIGATION     VAGINAL DELIVERY     2   Family History  Problem Relation Age of Onset   Heart disease Mother    Stroke Mother    Heart disease Father    Dementia Father    Heart disease Maternal Grandmother    Heart disease Maternal Grandfather    Heart disease Paternal Grandmother    Heart disease Paternal Grandfather    Breast cancer Maternal Aunt    Social History   Socioeconomic History   Marital status: Divorced  Spouse name: Not on file   Number of children: Not on file   Years of education: Not on file   Highest education level: 12th grade  Occupational History   Not on file  Tobacco Use   Smoking status: Never   Smokeless tobacco: Never  Vaping Use   Vaping status: Never Used  Substance and Sexual Activity   Alcohol use: Not Currently    Alcohol/week: 1.0 standard drink of alcohol    Types: 1 Cans of beer per week    Comment: occassionally   Drug use: No   Sexual activity: Not on file  Other Topics Concern   Not on file  Social History Narrative   ** Merged History Encounter **       Lives in [place] with husband.         Social Drivers of Corporate investment banker Strain: Low Risk  (08/12/2023)   Overall Financial Resource Strain (CARDIA)    Difficulty of Paying Living Expenses: Not very hard  Food Insecurity: No Food Insecurity (08/12/2023)   Hunger Vital Sign    Worried About Running Out of Food in the Last Year: Never true    Ran Out of Food in the Last Year: Never true  Transportation Needs: No Transportation Needs (08/12/2023)   PRAPARE - Administrator, Civil Service (Medical): No    Lack of Transportation (Non-Medical): No  Physical Activity: Sufficiently Active  (08/12/2023)   Exercise Vital Sign    Days of Exercise per Week: 4 days    Minutes of Exercise per Session: 60 min  Stress: Stress Concern Present (08/12/2023)   [employer] of Occupational Health - Occupational Stress Questionnaire    Feeling of Stress : To some extent  Social Connections: Moderately Integrated (08/12/2023)   Social Connection and Isolation Panel [NHANES]    Frequency of Communication with Friends and Family: More than three times a week    Frequency of Social Gatherings with Friends and Family: Three times a week    Attends Religious Services: More than 4 times per year    Active Member of Clubs or Organizations: Yes    Attends Banker Meetings: More than 4 times per year    Marital Status: Divorced  Recent Concern: Social Connections - Moderately Isolated (08/12/2023)   Social Connection and Isolation Panel [NHANES]    Frequency of Communication with Friends and Family: More than three times a week    Frequency of Social Gatherings with Friends and Family: Three times a week    Attends Religious Services: More than 4 times per year    Active Member of Clubs or Organizations: No    Attends Engineer, structural: Not on file    Marital Status: Divorced    Tobacco Counseling Counseling given: Not Answered    Clinical Intake:  Pre-visit preparation completed: Yes  Pain : No/denies pain     BMI - recorded: 28.9 Nutritional Status: BMI 25 -29 Overweight Nutritional Risks: None Diabetes: Yes CBG done?: No Did pt. bring in CBG monitor from home?: No  Lab Results  Component Value Date   HGBA1C 5.9 (A) 06/11/2023   HGBA1C 6.0 09/19/2022   HGBA1C 6.0 05/30/2022     How often do you need to have someone help you when you read instructions, pamphlets, or other written materials from your doctor or pharmacy?: 1 - Never  Interpreter Needed?: No  Information entered by :: R. Annabella Elford LPN   Activities  of Daily Living     08/13/2023     8:55 AM  In your present state of health, do you have any difficulty performing the following activities:  Hearing? 0  Vision? 0  Difficulty concentrating or making decisions? 0  Walking or climbing stairs? 1  Dressing or bathing? 0  Doing errands, shopping? 0  Preparing Food and eating ? N  Using the Toilet? N  In the past six months, have you accidently leaked urine? Y  Do you have problems with loss of bowel control? N  Managing your Medications? N  Managing your Finances? N  Housekeeping or managing your Housekeeping? N    Patient Care Team: Thersia Flax, MD as PCP - General (Internal Medicine)  Indicate any recent Medical Services you may have received from other than [facility] providers in the past year (date may be approximate).     Assessment:    This is a routine wellness examination for Katherine Jimenez.  Hearing/Vision screen Hearing Screening - Comments:: No issues Vision Screening - Comments:: glasses   Goals Addressed             This Visit's Progress    Patient Stated       Wants to continue to exercise       Depression Screen     08/13/2023    9:04 AM 06/11/2023   11:03 AM 04/24/2023    4:16 PM 10/17/2022    2:21 PM 08/09/2022    3:17 PM 07/18/2022    2:53 PM 06/07/2022    2:10 PM  PHQ 2/9 Scores  PHQ - 2 Score 1 2 0 0 2 0 2  PHQ- 9 Score 3 4 1  3  0 6    Fall Risk     08/13/2023    8:58 AM 06/11/2023   11:02 AM 04/24/2023    4:16 PM 10/17/2022    2:21 PM 08/09/2022    3:23 PM  Fall Risk   Falls in the past year? 1 1 1  0 0  Number falls in past yr: 1 0 1 0 0  Injury with Fall? 0 0 0 0 0  Risk for fall due to : History of fall(s);Impaired balance/gait History of fall(s) History of fall(s) No Fall Risks No Fall Risks  Follow up Falls evaluation completed;Falls prevention discussed Falls evaluation completed Falls evaluation completed Falls evaluation completed Falls prevention discussed;Falls evaluation completed    MEDICARE RISK AT HOME:  Medicare Risk at  Home Any stairs in or around the home?: Yes If so, are there any without handrails?: No Home free of loose throw rugs in walkways, pet beds, electrical cords, etc?: Yes Adequate lighting in your home to reduce risk of falls?: Yes Life alert?: No Use of a cane, walker or w/c?: No Grab bars in the bathroom?: No Shower chair or bench in shower?: No Elevated toilet seat or a handicapped toilet?: No  TIMED UP AND GO:  Was the test performed?  No  Cognitive Function: 6CIT completed        08/13/2023    9:10 AM 08/09/2022    3:20 PM 10/14/2018   12:24 PM  6CIT Screen  What Year? 0 points 0 points 0 points  What month? 0 points 0 points 0 points  What time? 0 points 0 points 0 points  Count back from 20 0 points 0 points 0 points  Months in reverse 0 points 0 points 0 points  Repeat phrase 2 points 2 points 0 points  Total Score 2 points 2 points 0 points    Immunizations Immunization History  Administered Date(s) Administered   Fluad Quad(high Dose 65+) 01/06/2021   Fluad Trivalent(High Dose 65+) 11/24/2017   Influenza, High Dose Seasonal PF 11/20/2018, 02/27/2022, 01/28/2023   Influenza-Unspecified 12/02/2013, 12/31/2014, 12/27/2015, 01/03/2017, 11/24/2017, 12/03/2019   PFIZER(Purple Top)SARS-COV-2 Vaccination 05/26/2019, 06/16/2019, 03/17/2020   Pneumococcal Conjugate-13 11/20/2018   Pneumococcal Polysaccharide-23 04/22/2015   Respiratory Syncytial Virus Vaccine,Recomb Aduvanted(Arexvy) 04/29/2022   Tdap 11/20/2018   Zoster Recombinant(Shingrix) 11/20/2018, 04/20/2021, 09/26/2021    Screening Tests Health Maintenance  Topic Date Due   COVID-19 Vaccine (4 - 2024-25 season) 12/03/2022   MAMMOGRAM  07/14/2023   Medicare Annual Wellness (AWV)  08/09/2023   OPHTHALMOLOGY EXAM  09/01/2023   Diabetic kidney evaluation - Urine ACR  10/17/2023   INFLUENZA VACCINE  11/02/2023   Pneumonia Vaccine 62+ Years old (3 of 3 - PCV20 or PCV21) 11/20/2023   HEMOGLOBIN A1C  12/12/2023    Diabetic kidney evaluation - eGFR measurement  04/23/2024   FOOT EXAM  06/10/2024   Colonoscopy  01/17/2026   DTaP/Tdap/Td (2 - Td or Tdap) 11/19/2028   DEXA SCAN  Completed   Hepatitis C Screening  Completed   Zoster Vaccines- Shingrix  Completed   HPV VACCINES  Aged Out   Meningococcal B Vaccine  Aged Out    Health Maintenance  Health Maintenance Due  Topic Date Due   COVID-19 Vaccine (4 - 2024-25 season) 12/03/2022   MAMMOGRAM  07/14/2023   Medicare Annual Wellness (AWV)  08/09/2023   Health Maintenance Items Addressed: Mammogram ordered, Patient is scheduled to see GI 10/2023  Additional Screening:  Vision Screening: Recommended annual ophthalmology exams for early detection of glaucoma and other disorders of the eye.Up to date [facility]  Dental Screening: Recommended annual dental exams for proper oral hygiene  Community Resource Referral / Chronic Care Management: CRR required this visit?  No   CCM required this visit?  No   Plan:    I have personally reviewed and noted the following in the patient's chart:   Medical and social history Use of alcohol, tobacco or illicit drugs  Current medications and supplements including opioid prescriptions. Patient is not currently taking opioid prescriptions. Functional ability and status Nutritional status Physical activity Advanced directives List of other physicians Hospitalizations, surgeries, and ER visits in previous 12 months Vitals Screenings to include cognitive, depression, and falls Referrals and appointments  In addition, I have reviewed and discussed with patient certain preventive protocols, quality metrics, and best practice recommendations. A written personalized care plan for preventive services as well as general preventive health recommendations were provided to patient.   Felicitas Horse, LPN   4/69/[id number]   After Visit Summary: (MyChart) Due to this being a telephonic visit, the after visit summary  with patients personalized plan was offered to patient via MyChart   Notes: Nothing significant to report at this time.

## 2023-08-13 NOTE — Patient Instructions (Signed)
 Katherine Jimenez , Thank you for taking time out of your busy schedule to complete your Annual Wellness Visit with me. I enjoyed our conversation and look forward to speaking with you again next year. I, as well as your care team,  appreciate your ongoing commitment to your health goals. Please review the following plan we discussed and let me know if I can assist you in the future. Your Game plan/ To Do List    Referrals: If you haven't heard from the office you've been referred to, please reach out to them at the phone provided.   Mammogram order placed.  You have an order for:  []   2D Mammogram  [x]   3D Mammogram  []   Bone Density     Please call for appointment:  [facility]  [address] [place] [address] [phone]     Make sure to wear two-piece clothing.  No lotions, powders, or deodorants the day of the appointment. Make sure to bring picture ID and insurance card.  Bring list of medications you are currently taking including any supplements.    Follow up Visits: Next Medicare AWV with our clinical staff: 08/13/24 @ 2:20   Have you seen your provider in the last 6 months (3 months if uncontrolled diabetes)? Yes Next Office Visit with your provider: 12/13/23  Clinician Recommendations:  Aim for 30 minutes of exercise or brisk walking, 6-8 glasses of water, and 5 servings of fruits and vegetables each day.       This is a list of the screening recommended for you and due dates:  Health Maintenance  Topic Date Due   COVID-19 Vaccine (4 - 2024-25 season) 12/03/2022   Mammogram  07/14/2023   Eye exam for diabetics  09/01/2023   Yearly kidney health urinalysis for diabetes  10/17/2023   Flu Shot  11/02/2023   Pneumonia Vaccine (3 of 3 - PCV20 or PCV21) 11/20/2023   Hemoglobin A1C  12/12/2023   Yearly kidney function blood test for diabetes  04/23/2024   Complete foot exam   06/10/2024   Medicare Annual Wellness Visit   08/12/2024   Cologuard (Stool DNA test)  07/03/2026   DTaP/Tdap/Td vaccine (2 - Td or Tdap) 11/19/2028   DEXA scan (bone density measurement)  Completed   Hepatitis C Screening  Completed   Zoster (Shingles) Vaccine  Completed   HPV Vaccine  Aged Out   Meningitis B Vaccine  Aged Out   Colon Cancer Screening  Discontinued    Advanced directives: (ACP Link)Information on Advanced Care Planning can be found at [place]  Secretary of State Advance Health Care Directives Advance Health Care Directives. [email or web address]  Advance Care Planning is important because it:  [x]  Makes sure you receive the medical care that is consistent with your values, goals, and preferences  [x]  It provides guidance to your family and loved ones and reduces their decisional burden about whether or not they are making the right decisions based on your wishes.  Follow the link provided in your after visit summary or read over the paperwork we have mailed to you to help you started getting your Advance Directives in place. If you need assistance in completing these, please reach out to us  so that we can help you!

## 2023-09-04 ENCOUNTER — Telehealth: Payer: Self-pay

## 2023-09-04 ENCOUNTER — Other Ambulatory Visit: Payer: Self-pay | Admitting: Internal Medicine

## 2023-09-04 NOTE — Telephone Encounter (Signed)
 Spoke with pt to let her know that we have received her patient assistance medication and it is ready for pick up.    Ozempic 0.25/0.5 mg: 4 boxes

## 2023-09-24 DIAGNOSIS — M79672 Pain in left foot: Secondary | ICD-10-CM | POA: Diagnosis not present

## 2023-09-24 DIAGNOSIS — M19072 Primary osteoarthritis, left ankle and foot: Secondary | ICD-10-CM | POA: Diagnosis not present

## 2023-09-24 NOTE — Telephone Encounter (Signed)
 Patient picked up the Patient Assistance Medications.

## 2023-09-25 DIAGNOSIS — H52223 Regular astigmatism, bilateral: Secondary | ICD-10-CM | POA: Diagnosis not present

## 2023-09-28 DIAGNOSIS — H353131 Nonexudative age-related macular degeneration, bilateral, early dry stage: Secondary | ICD-10-CM | POA: Diagnosis not present

## 2023-09-28 DIAGNOSIS — H26492 Other secondary cataract, left eye: Secondary | ICD-10-CM | POA: Diagnosis not present

## 2023-09-28 DIAGNOSIS — Z961 Presence of intraocular lens: Secondary | ICD-10-CM | POA: Diagnosis not present

## 2023-09-28 DIAGNOSIS — E119 Type 2 diabetes mellitus without complications: Secondary | ICD-10-CM | POA: Diagnosis not present

## 2023-09-28 LAB — HM DIABETES EYE EXAM

## 2023-10-01 DIAGNOSIS — H26492 Other secondary cataract, left eye: Secondary | ICD-10-CM | POA: Diagnosis not present

## 2023-10-03 DIAGNOSIS — H353131 Nonexudative age-related macular degeneration, bilateral, early dry stage: Secondary | ICD-10-CM | POA: Diagnosis not present

## 2023-10-03 DIAGNOSIS — H43813 Vitreous degeneration, bilateral: Secondary | ICD-10-CM | POA: Diagnosis not present

## 2023-10-03 DIAGNOSIS — E119 Type 2 diabetes mellitus without complications: Secondary | ICD-10-CM | POA: Diagnosis not present

## 2023-10-03 DIAGNOSIS — H43393 Other vitreous opacities, bilateral: Secondary | ICD-10-CM | POA: Diagnosis not present

## 2023-10-04 ENCOUNTER — Ambulatory Visit: Payer: Self-pay | Admitting: Internal Medicine

## 2023-10-04 ENCOUNTER — Ambulatory Visit (INDEPENDENT_AMBULATORY_CARE_PROVIDER_SITE_OTHER): Admitting: Internal Medicine

## 2023-10-04 ENCOUNTER — Encounter: Payer: Self-pay | Admitting: Internal Medicine

## 2023-10-04 VITALS — BP 112/66 | HR 112 | Temp 98.5°F | Ht 60.0 in | Wt 147.0 lb

## 2023-10-04 DIAGNOSIS — R300 Dysuria: Secondary | ICD-10-CM

## 2023-10-04 DIAGNOSIS — N3000 Acute cystitis without hematuria: Secondary | ICD-10-CM | POA: Insufficient documentation

## 2023-10-04 LAB — POC URINALSYSI DIPSTICK (AUTOMATED)
Glucose, UA: NEGATIVE
Nitrite, UA: POSITIVE
Protein, UA: POSITIVE — AB
Spec Grav, UA: >=1.03 — AB (ref 1.010–1.025)
Urobilinogen, UA: 0.2 U/dL
pH, UA: 5.5 (ref 5.0–8.0)

## 2023-10-04 MED ORDER — SULFAMETHOXAZOLE-TRIMETHOPRIM 800-160 MG PO TABS: 1.0000 | ORAL_TABLET | Freq: Two times a day (BID) | ORAL | 1 refills | Status: DC

## 2023-10-04 NOTE — Progress Notes (Signed)
 Subjective:    Patient ID: Katherine Jimenez, female    DOB: [birth year], 73 y.o.   MRN: [id number]  HPI Here due to urinary symptoms  Started last night--didn't seem to be able to empty bladder Terminal dysuria Worse this morning Some urgency--with small amount of urine  No clear fever  Cystex and cranberry today--still hurting  Current Outpatient Medications on File Prior to Visit  Medication Sig Dispense Refill   acetaminophen  (TYLENOL ) 325 MG tablet Take 650 mg by mouth every 6 (six) hours as needed.     Biotin  5 MG CAPS Take 2 capsules by mouth 2 (two) times daily.      blood glucose meter kit and supplies Dispense Contour next bayer meter. E11.9 To check blood glucose  Once a day. 1 each 0   busPIRone  (BUSPAR ) 30 MG tablet Take 1 tablet (30 mg total) by mouth in the morning and at bedtime. 180 tablet 1   Cholecalciferol  (VITAMIN D3) 1000 units CAPS Take 1,000 Units by mouth daily.      cyanocobalamin  (VITAMIN B12) 1000 MCG/ML injection Inject 1 mL (1,000 mcg total) into the muscle every 30 (thirty) days. 3 mL 4   diphenhydramine -acetaminophen  (TYLENOL  PM) 25-500 MG TABS tablet Take 1 tablet by mouth at bedtime as needed.     DULoxetine  (CYMBALTA ) 60 MG capsule TAKE 1 CAPSULE BY MOUTH DAILY 90 capsule 1   gabapentin  (NEURONTIN ) 100 MG capsule TAKE ONE CAPSULE BY MOUTH THREE TIMES A DAY 90 capsule 2   glucose blood (FREESTYLE LITE) test strip Use once daily  as instructed to test blood sugar E 11.9 100 each 3   lamoTRIgine  (LAMICTAL ) 200 MG tablet Take 1 tablet (200 mg total) by mouth 2 (two) times daily. 180 tablet 1   Lancets (FREESTYLE) lancets Use once daily to check blood sugars  E11.9 100 each 3   losartan  (COZAAR ) 50 MG tablet Take 1 tablet (50 mg total) by mouth daily. 90 tablet 1   Multiple Vitamins-Minerals (PRESERVISION AREDS 2 PO) Take by mouth.     naproxen sodium (ALEVE) 220 MG tablet Take 220 mg by mouth daily as needed.     omeprazole  (PRILOSEC) 40 MG capsule TAKE  1 CAPSULE BY MOUTH DAILY 90 capsule 3   Semaglutide ,0.25 or 0.5MG /DOS, (OZEMPIC , 0.25 OR 0.5 MG/DOSE,) 2 MG/1.5ML SOPN Inject 0.5 mg into the skin once a week. 4.5 mL 2   Syringe, Disposable, 1 ML MISC For use with B12 injections 25 each 3   Syringe/Needle, Disp, (SYRINGE 3CC/25GX1) 25G X 1 3 ML MISC Use for b12 injections 50 each 0   zolpidem  (AMBIEN ) 10 MG tablet TAKE 1 TABLET BY MOUTH AT BEDTIME AS NEEDED FOR SLEEP 30 tablet 5   GLUCOSAMINE HCL PO Take 1 capsule by mouth daily. (Patient not taking: Reported on 10/04/2023)     Iron , Ferrous Sulfate , 325 (65 Fe) MG TABS Take 325 mg by mouth daily. (Patient not taking: Reported on 10/04/2023) 30 tablet 1   nitroGLYCERIN  (NITROSTAT ) 0.4 MG SL tablet Place 1 tablet (0.4 mg total) under the tongue every 5 (five) minutes as needed for chest pain. (Patient not taking: Reported on 10/04/2023) 50 tablet 3   tretinoin  (RETIN-A ) 0.05 % cream Apply pea-sized amount to face at bedtime, wash off in morning. (Patient not taking: Reported on 10/04/2023) 20 g 3   No current facility-administered medications on file prior to visit.    Allergies  Allergen Reactions   Atorvastatin      Liver enzyme  elevation    Celebrex  [Celecoxib ] Other (See Comments)    Liver enzyme elevation    Thimerosal (Thiomersal) Itching    Past Medical History:  Diagnosis Date   Arthritis    back, knees, fingers   Bipolar disorder (HCC)    Bunion, left    Depression    Diabetes mellitus    diet controlled.    Elevated liver enzymes 05/31/2021   Hypertension    Lab test positive for detection of COVID-19 virus 03/02/2021   Lower back pain    S/P fall   Squamous cell skin cancer, face    [facility]   Treadmill stress test negative for angina pectoris June 2013    Past Surgical History:  Procedure Laterality Date   BLADDER AUGMENTATION     BLADDER SURGERY     CARDIAC CATHETERIZATION  03/05/12   no stents   CATARACT EXTRACTION W/PHACO Right 07/19/2015   Procedure: CATARACT  EXTRACTION PHACO AND INTRAOCULAR LENS PLACEMENT (IOC) right eye;  Surgeon: Donzell Arlyce Budd, MD;  Location: [facility];  Service: Ophthalmology;  Laterality: Right;  BORDERLINE DIABETIC - oral meds   CATARACT EXTRACTION W/PHACO Left 05/28/2017   Procedure: CATARACT EXTRACTION PHACO AND INTRAOCULAR LENS PLACEMENT (IOC) LEFT DIABETIC;  Surgeon: Mittie Gaskin, MD;  Location: [facility];  Service: Ophthalmology;  Laterality: Left;  Diabetic - oal meds   JOINT REPLACEMENT  2012   bilateral hip   KNEE ARTHROSCOPY     TOTAL HIP ARTHROPLASTY  2012   TOTAL KNEE ARTHROPLASTY Right 06/04/2017   Procedure: TOTAL KNEE ARTHROPLASTY;  Surgeon: Leora Lynwood SAUNDERS, MD;  Location: [facility];  Service: Orthopedics;  Laterality: Right;   TUBAL LIGATION     VAGINAL DELIVERY     2    Family History  Problem Relation Age of Onset   Heart disease Mother    Stroke Mother    Heart disease Father    Dementia Father    Heart disease Maternal Grandmother    Heart disease Maternal Grandfather    Heart disease Paternal Grandmother    Heart disease Paternal Grandfather    Breast cancer Maternal Aunt     Social History   Socioeconomic History   Marital status: Divorced    Spouse name: Not on file   Number of children: Not on file   Years of education: Not on file   Highest education level: 12th grade  Occupational History   Not on file  Tobacco Use   Smoking status: Never   Smokeless tobacco: Never  Vaping Use   Vaping status: Never Used  Substance and Sexual Activity   Alcohol use: Not Currently    Alcohol/week: 1.0 standard drink of alcohol    Types: 1 Cans of beer per week    Comment: occassionally   Drug use: No   Sexual activity: Not on file  Other Topics Concern   Not on file  Social History Narrative   ** Merged History Encounter **       Lives in [place] with husband.         Social Drivers of Corporate investment banker Strain: Low Risk  (08/12/2023)    Overall Financial Resource Strain (CARDIA)    Difficulty of Paying Living Expenses: Not very hard  Food Insecurity: No Food Insecurity (08/12/2023)   Hunger Vital Sign    Worried About Running Out of Food in the Last Year: Never true    Ran Out of Food in the Last  Year: Never true  Transportation Needs: No Transportation Needs (08/12/2023)   PRAPARE - Administrator, Civil Service (Medical): No    Lack of Transportation (Non-Medical): No  Physical Activity: Sufficiently Active (08/12/2023)   Exercise Vital Sign    Days of Exercise per Week: 4 days    Minutes of Exercise per Session: 60 min  Stress: Stress Concern Present (08/12/2023)   [employer] of Occupational Health - Occupational Stress Questionnaire    Feeling of Stress : To some extent  Social Connections: Moderately Integrated (08/12/2023)   Social Connection and Isolation Panel    Frequency of Communication with Friends and Family: More than three times a week    Frequency of Social Gatherings with Friends and Family: Three times a week    Attends Religious Services: More than 4 times per year    Active Member of Clubs or Organizations: Yes    Attends Banker Meetings: More than 4 times per year    Marital Status: Divorced  Recent Concern: Social Connections - Moderately Isolated (08/12/2023)   Social Connection and Isolation Panel    Frequency of Communication with Friends and Family: More than three times a week    Frequency of Social Gatherings with Friends and Family: Three times a week    Attends Religious Services: More than 4 times per year    Active Member of Clubs or Organizations: No    Attends Banker Meetings: Not on file    Marital Status: Divorced  Intimate Partner Violence: Not At Risk (08/13/2023)   Humiliation, Afraid, Rape, and Kick questionnaire    Fear of Current or Ex-Partner: No    Emotionally Abused: No    Physically Abused: No    Sexually Abused: No   Review  of Systems No N/V Generally doesn't feel right---could be the heat No new back pain--just some low back pain Able to eat    Objective:   Physical Exam Constitutional:      Appearance: Normal appearance.  Abdominal:     Palpations: Abdomen is soft.     Tenderness: There is no abdominal tenderness. There is no right CVA tenderness, left CVA tenderness or guarding.  Neurological:     Mental Status: She is alert.            Assessment & Plan:

## 2023-10-04 NOTE — Assessment & Plan Note (Signed)
 Seems limited to bladder Urinalysis 3+ leuks and blood, 1+ nitrite Will send culture Empiric Rx with septra  DS bid x 3 days (with refill)

## 2023-10-04 NOTE — Addendum Note (Signed)
 Addended by: KALLIE CLOTILDA SQUIBB on: 10/04/2023 03:25 PM   Modules accepted: Orders

## 2023-10-06 LAB — URINE CULTURE
MICRO NUMBER:: 16657764
SPECIMEN QUALITY:: ADEQUATE

## 2023-10-09 ENCOUNTER — Ambulatory Visit
Admission: RE | Admit: 2023-10-09 | Discharge: 2023-10-09 | Disposition: A | Discharge status: Home / Self Care | Source: Ambulatory Visit | Attending: Internal Medicine | Admitting: Internal Medicine

## 2023-10-09 DIAGNOSIS — Z1231 Encounter for screening mammogram for malignant neoplasm of breast: Secondary | ICD-10-CM | POA: Insufficient documentation

## 2023-10-11 ENCOUNTER — Telehealth: Payer: Self-pay | Admitting: Internal Medicine

## 2023-10-11 NOTE — Telephone Encounter (Signed)
 Pt stopped by to drop off disability parking placard renewal paperwork to be signed by Dr Marylynn. It is in her color folder up front. [facility]

## 2023-10-17 ENCOUNTER — Telehealth: Payer: Self-pay

## 2023-10-17 NOTE — Telephone Encounter (Signed)
 Noted

## 2023-10-17 NOTE — Telephone Encounter (Signed)
 Copied from CRM [id number]. Topic: Clinical - Lab/Test Results >> Oct 17, 2023  8:08 AM Katherine Jimenez wrote: Reason for CRM: Patient would like a call to discuss mammogram results if it is abnormal but if it is normal she stated no callback is needed

## 2023-10-17 NOTE — Telephone Encounter (Unsigned)
 Copied from CRM [id number]. Topic: Clinical - Lab/Test Results >> Oct 17, 2023  1:50 PM Thersia BROCKS wrote: Reason for CRM: Patient called in regarding a missed called about test results, relayed message patient stated she understood and had no further questions

## 2023-10-17 NOTE — Telephone Encounter (Signed)
 Left message for patient to give our office a call back to discuss recent lab results.   OK for E2C2 to give result note if patient calls back. If relayed, please notify the office.

## 2023-10-18 DIAGNOSIS — M130 Polyarthritis, unspecified: Secondary | ICD-10-CM | POA: Diagnosis not present

## 2023-10-18 DIAGNOSIS — M2042 Other hammer toe(s) (acquired), left foot: Secondary | ICD-10-CM | POA: Diagnosis not present

## 2023-10-18 DIAGNOSIS — M1711 Unilateral primary osteoarthritis, right knee: Secondary | ICD-10-CM | POA: Diagnosis not present

## 2023-10-18 DIAGNOSIS — M2012 Hallux valgus (acquired), left foot: Secondary | ICD-10-CM | POA: Diagnosis not present

## 2023-10-18 DIAGNOSIS — M545 Low back pain, unspecified: Secondary | ICD-10-CM | POA: Diagnosis not present

## 2023-10-18 DIAGNOSIS — M791 Myalgia, unspecified site: Secondary | ICD-10-CM | POA: Diagnosis not present

## 2023-10-18 DIAGNOSIS — M19072 Primary osteoarthritis, left ankle and foot: Secondary | ICD-10-CM | POA: Diagnosis not present

## 2023-10-23 ENCOUNTER — Telehealth: Payer: Self-pay

## 2023-10-23 DIAGNOSIS — H26492 Other secondary cataract, left eye: Secondary | ICD-10-CM | POA: Diagnosis not present

## 2023-10-23 NOTE — Telephone Encounter (Signed)
 Left message to call the office back to let her know the handicap parking form is up front ready for pick up.

## 2023-10-23 NOTE — Telephone Encounter (Signed)
 Left message to call the office back to let her know that her handicap parking form is filled out and up front ready for pick up.

## 2023-10-29 ENCOUNTER — Telehealth: Payer: Self-pay

## 2023-10-29 NOTE — Telephone Encounter (Signed)
 LMTCB

## 2023-10-29 NOTE — Telephone Encounter (Signed)
 Copied from CRM [id number]. Topic: Appointments - Scheduling Inquiry for Clinic >> Oct 29, 2023  4:24 PM [name] wrote: Reason for CRM: Patient is calling in to request an sooner appoint with dr marylynn within the next week or 2 for a follow up and concerns. Also is requesting for appointments to be sent by text message.

## 2023-10-30 DIAGNOSIS — R350 Frequency of micturition: Secondary | ICD-10-CM | POA: Diagnosis not present

## 2023-10-30 DIAGNOSIS — Z6828 Body mass index (BMI) 28.0-28.9, adult: Secondary | ICD-10-CM | POA: Diagnosis not present

## 2023-10-30 NOTE — Telephone Encounter (Signed)
Spoke with pt and scheduled her for an appt tomorrow with Dr. Derrel Nip.

## 2023-10-30 NOTE — Telephone Encounter (Unsigned)
 Copied from CRM [id number]. Topic: General - Other >> Oct 30, 2023  8:55 AM Deaijah H wrote: Reason for CRM: Patient called in to return [place] call. Please call [phone]

## 2023-10-31 ENCOUNTER — Ambulatory Visit: Admitting: Internal Medicine

## 2023-11-01 DIAGNOSIS — M1712 Unilateral primary osteoarthritis, left knee: Secondary | ICD-10-CM | POA: Diagnosis not present

## 2023-11-01 DIAGNOSIS — S322XXA Fracture of coccyx, initial encounter for closed fracture: Secondary | ICD-10-CM | POA: Diagnosis not present

## 2023-11-07 ENCOUNTER — Encounter: Payer: Self-pay | Admitting: Internal Medicine

## 2023-11-07 ENCOUNTER — Ambulatory Visit: Admitting: Internal Medicine

## 2023-11-07 ENCOUNTER — Other Ambulatory Visit

## 2023-11-07 VITALS — BP 104/68 | HR 106 | Temp 98.4°F | Ht 60.0 in | Wt 145.4 lb

## 2023-11-07 DIAGNOSIS — R5383 Other fatigue: Secondary | ICD-10-CM | POA: Diagnosis not present

## 2023-11-07 DIAGNOSIS — D500 Iron deficiency anemia secondary to blood loss (chronic): Secondary | ICD-10-CM | POA: Diagnosis not present

## 2023-11-07 DIAGNOSIS — K5900 Constipation, unspecified: Secondary | ICD-10-CM | POA: Diagnosis not present

## 2023-11-07 DIAGNOSIS — D72823 Leukemoid reaction: Secondary | ICD-10-CM

## 2023-11-07 DIAGNOSIS — I10 Essential (primary) hypertension: Secondary | ICD-10-CM

## 2023-11-07 DIAGNOSIS — E785 Hyperlipidemia, unspecified: Secondary | ICD-10-CM | POA: Diagnosis not present

## 2023-11-07 DIAGNOSIS — N3000 Acute cystitis without hematuria: Secondary | ICD-10-CM

## 2023-11-07 DIAGNOSIS — R300 Dysuria: Secondary | ICD-10-CM | POA: Insufficient documentation

## 2023-11-07 DIAGNOSIS — R748 Abnormal levels of other serum enzymes: Secondary | ICD-10-CM

## 2023-11-07 DIAGNOSIS — E1169 Type 2 diabetes mellitus with other specified complication: Secondary | ICD-10-CM

## 2023-11-07 DIAGNOSIS — F3111 Bipolar disorder, current episode manic without psychotic features, mild: Secondary | ICD-10-CM | POA: Diagnosis not present

## 2023-11-07 DIAGNOSIS — E118 Type 2 diabetes mellitus with unspecified complications: Secondary | ICD-10-CM

## 2023-11-07 LAB — MICROALBUMIN / CREATININE URINE RATIO
Creatinine,U: 209 mg/dL
Microalb Creat Ratio: 11 mg/g (ref 0.0–30.0)
Microalb, Ur: 2.3 mg/dL — ABNORMAL HIGH (ref 0.0–1.9)

## 2023-11-07 LAB — COMPREHENSIVE METABOLIC PANEL WITH GFR
ALT: 15 U/L (ref 0–35)
AST: 10 U/L (ref 0–37)
Albumin: 4.2 g/dL (ref 3.5–5.2)
Alkaline Phosphatase: 190 U/L — ABNORMAL HIGH (ref 39–117)
BUN: 13 mg/dL (ref 6–23)
CO2: 27 meq/L (ref 19–32)
Calcium: 9.2 mg/dL (ref 8.4–10.5)
Chloride: 99 meq/L (ref 96–112)
Creatinine, Ser: 0.78 mg/dL (ref 0.40–1.20)
GFR: 76.25 mL/min (ref >60.00)
Glucose, Bld: 191 mg/dL — ABNORMAL HIGH (ref 70–99)
Potassium: 4.1 meq/L (ref 3.5–5.1)
Sodium: 133 meq/L — ABNORMAL LOW (ref 135–145)
Total Bilirubin: 0.4 mg/dL (ref 0.2–1.2)
Total Protein: 7.2 g/dL (ref 6.0–8.3)

## 2023-11-07 LAB — URINALYSIS, ROUTINE W REFLEX MICROSCOPIC
Hgb urine dipstick: NEGATIVE
Leukocytes,Ua: NEGATIVE
Nitrite: NEGATIVE
Specific Gravity, Urine: 1.025 (ref 1.000–1.030)
Urine Glucose: NEGATIVE
Urobilinogen, UA: 1 (ref 0.0–1.0)
pH: 6 (ref 5.0–8.0)

## 2023-11-07 LAB — IBC + FERRITIN
Ferritin: 42.9 ng/mL (ref 10.0–291.0)
Iron: 39 ug/dL — ABNORMAL LOW (ref 42–145)
Saturation Ratios: 8.7 % — ABNORMAL LOW (ref 20.0–50.0)
TIBC: 448 ug/dL (ref 250.0–450.0)
Transferrin: 320 mg/dL (ref 212.0–360.0)

## 2023-11-07 LAB — CBC WITH DIFFERENTIAL/PLATELET
Basophils Absolute: 0 K/uL (ref 0.0–0.1)
Basophils Relative: 0.4 % (ref 0.0–3.0)
Eosinophils Absolute: 0.2 K/uL (ref 0.0–0.7)
Eosinophils Relative: 1.6 % (ref 0.0–5.0)
HCT: 37.1 % (ref 36.0–46.0)
Hemoglobin: 12 g/dL (ref 12.0–15.0)
Lymphocytes Relative: 24.9 % (ref 12.0–46.0)
Lymphs Abs: 2.8 K/uL (ref 0.7–4.0)
MCHC: 32.3 g/dL (ref 30.0–36.0)
MCV: 89.1 fl (ref 78.0–100.0)
Monocytes Absolute: 0.3 K/uL (ref 0.1–1.0)
Monocytes Relative: 2.9 % — ABNORMAL LOW (ref 3.0–12.0)
Neutro Abs: 7.9 K/uL — ABNORMAL HIGH (ref 1.4–7.7)
Neutrophils Relative %: 70.2 % (ref 43.0–77.0)
Platelets: 471 K/uL — ABNORMAL HIGH (ref 150.0–400.0)
RBC: 4.16 Mil/uL (ref 3.87–5.11)
RDW: 16 % — ABNORMAL HIGH (ref 11.5–15.5)
WBC: 11.2 K/uL — ABNORMAL HIGH (ref 4.0–10.5)

## 2023-11-07 LAB — LIPID PANEL
Cholesterol: 200 mg/dL (ref 0–200)
HDL: 76.2 mg/dL (ref >39.00)
LDL Cholesterol: 107 mg/dL — ABNORMAL HIGH (ref 0–99)
NonHDL: 123.88
Total CHOL/HDL Ratio: 3
Triglycerides: 85 mg/dL (ref 0.0–149.0)
VLDL: 17 mg/dL (ref 0.0–40.0)

## 2023-11-07 LAB — LDL CHOLESTEROL, DIRECT: Direct LDL: 94 mg/dL

## 2023-11-07 LAB — HEMOGLOBIN A1C: Hgb A1c MFr Bld: 6.6 % — ABNORMAL HIGH (ref 4.6–6.5)

## 2023-11-07 LAB — TSH: TSH: 0.88 u[IU]/mL (ref 0.35–5.50)

## 2023-11-07 MED ORDER — BUSPIRONE HCL 30 MG PO TABS: 30.0000 mg | ORAL_TABLET | Freq: Two times a day (BID) | ORAL | 1 refills | Status: DC

## 2023-11-07 MED ORDER — EZETIMIBE 10 MG PO TABS: 10.0000 mg | ORAL_TABLET | Freq: Every day | ORAL | 2 refills | Status: DC

## 2023-11-07 MED ORDER — LOSARTAN POTASSIUM 50 MG PO TABS: 50.0000 mg | ORAL_TABLET | Freq: Every day | ORAL | 1 refills | Status: AC

## 2023-11-07 MED ORDER — OMEPRAZOLE 40 MG PO CPDR: 40.0000 mg | DELAYED_RELEASE_CAPSULE | Freq: Every day | ORAL | 3 refills | Status: AC

## 2023-11-07 NOTE — Patient Instructions (Addendum)
 IF YOU DON'T WANT TO TAKE A DAILY ASPIRIN ,  PLEASE CONSIDER TAKING IT EVERY OTHER DAY   You have agreed to a TRIAL OF ZETIA  DAILY TO LOWER CHOLESTEROL . Please return for liver enzymes 3 weeks after starting medication   Resume miralax NIGHTLY.  If constipation does not improve in one week ,let me know

## 2023-11-07 NOTE — Assessment & Plan Note (Signed)
 Currently stooling 1/week.  Not taking ozempic  or miralax daily.  Did not tolerate iron  supplements,,   Stay off ozempic .  Add miralax  fobt positive but  no gross melena.  Has IDA,  Has been referred for colonoscopy, procedure is not until October. Ma yneed ct abd pelvis

## 2023-11-07 NOTE — Progress Notes (Unsigned)
 Subjective:  Patient ID: Katherine Jimenez, female    DOB: 07-12-1950  Age: 73 y.o. MRN: [id number]  CC: The primary encounter diagnosis was Primary hypertension. Diagnoses of Hyperlipidemia associated with type 2 diabetes mellitus (HCC), DM type 2, controlled, with complication (HCC), and Other fatigue were also pertinent to this visit.   HPI Katherine Jimenez presents for  Chief Complaint  Patient presents with   Medical Management of Chronic Issues   3) pain: saw BAbish for left knee pain secondary to OA and probable coccygeal fracture on July 31.  Received steroid injection in left knee ; advised to use innertube and rx meloxicams for slow healing tailbone   2) seen by urgent care on July 29 for urinary frequency and dysuria.  UA was positive but CULTURE CULTURE RESULTS S REVIEWED VIA EPIC PORTAL  1)  had a fall out of bed,  causing injuries to leg and tailbone and several skin tears.  So she stopped taking aspirin    4) chronic foot ain with deformity secondary to large bunion.  Has had several opinions both positive and negative about surgery.    5) bipolar:  seeing [facility]   taking lamictal  ,  having episode of mania aggravated by a drug overdose of her younger sister  in [place],  [place]  resulting in ICU admission and mechanical ventilation for > 1 week.  .waiting to see the new psychiatrist    6) Hypertension: patient checks blood pressure twice weekly at home.  Readings have been for the most part <120/70 at rest . Patient is following a reduced salt diet most days and is taking medications as prescribed    7) Type 2 DM:  had an episode of symptomatic hypoglycemia at supper time  cbg was 59  resolved with sugar.  Not currently taking ozempic    8) IDA:  did not  take  iron  due to constipation , severe  seeing Dr Therisa in October for colonoscopy  9) constipation: moving bowels only once a week . Has been her pattern since April     Outpatient Medications Prior to Visit  Medication  Sig Dispense Refill   GLUCOSAMINE HCL PO Take 1 capsule by mouth daily.     acetaminophen  (TYLENOL ) 325 MG tablet Take 650 mg by mouth every 6 (six) hours as needed.     Biotin  5 MG CAPS Take 2 capsules by mouth 2 (two) times daily.      blood glucose meter kit and supplies Dispense Contour next bayer meter. E11.9 To check blood glucose  Once a day. 1 each 0   busPIRone  (BUSPAR ) 30 MG tablet Take 1 tablet (30 mg total) by mouth in the morning and at bedtime. 180 tablet 1   Cholecalciferol  (VITAMIN D3) 1000 units CAPS Take 1,000 Units by mouth daily.      DULoxetine  (CYMBALTA ) 60 MG capsule TAKE 1 CAPSULE BY MOUTH DAILY 90 capsule 1   glucose blood (FREESTYLE LITE) test strip Use once daily  as instructed to test blood sugar E 11.9 100 each 3   lamoTRIgine  (LAMICTAL ) 200 MG tablet Take 1 tablet (200 mg total) by mouth 2 (two) times daily. 180 tablet 1   Lancets (FREESTYLE) lancets Use once daily to check blood sugars  E11.9 100 each 3   losartan  (COZAAR ) 50 MG tablet Take 1 tablet (50 mg total) by mouth daily. 90 tablet 1   Multiple Vitamins-Minerals (PRESERVISION AREDS 2 PO) Take by mouth.     naproxen sodium (ALEVE)  220 MG tablet Take 220 mg by mouth daily as needed.     nitroGLYCERIN  (NITROSTAT ) 0.4 MG SL tablet Place 1 tablet (0.4 mg total) under the tongue every 5 (five) minutes as needed for chest pain. (Patient not taking: Reported on 10/04/2023) 50 tablet 3   omeprazole  (PRILOSEC) 40 MG capsule TAKE 1 CAPSULE BY MOUTH DAILY 90 capsule 3   Semaglutide ,0.25 or 0.5MG /DOS, (OZEMPIC , 0.25 OR 0.5 MG/DOSE,) 2 MG/1.5ML SOPN Inject 0.5 mg into the skin once a week. 4.5 mL 2   zolpidem  (AMBIEN ) 10 MG tablet TAKE 1 TABLET BY MOUTH AT BEDTIME AS NEEDED FOR SLEEP 30 tablet 5   cyanocobalamin  (VITAMIN B12) 1000 MCG/ML injection Inject 1 mL (1,000 mcg total) into the muscle every 30 (thirty) days. 3 mL 4   diphenhydramine -acetaminophen  (TYLENOL  PM) 25-500 MG TABS tablet Take 1 tablet by mouth at bedtime as  needed.     gabapentin  (NEURONTIN ) 100 MG capsule TAKE ONE CAPSULE BY MOUTH THREE TIMES A DAY 90 capsule 2   Iron , Ferrous Sulfate , 325 (65 Fe) MG TABS Take 325 mg by mouth daily. (Patient not taking: Reported on 10/04/2023) 30 tablet 1   sulfamethoxazole -trimethoprim  (BACTRIM  DS) 800-160 MG tablet Take 1 tablet by mouth 2 (two) times daily. 6 tablet 1   Syringe, Disposable, 1 ML MISC For use with B12 injections 25 each 3   Syringe/Needle, Disp, (SYRINGE 3CC/25GX1) 25G X 1 3 ML MISC Use for b12 injections 50 each 0   tretinoin  (RETIN-A ) 0.05 % cream Apply pea-sized amount to face at bedtime, wash off in morning. (Patient not taking: Reported on 10/04/2023) 20 g 3   No facility-administered medications prior to visit.    Review of Systems;  Patient denies headache, fevers, malaise, unintentional weight loss, skin rash, eye pain, sinus congestion and sinus pain, sore throat, dysphagia,  hemoptysis , cough, dyspnea, wheezing, chest pain, palpitations, orthopnea, edema, abdominal pain, nausea, melena, diarrhea, constipation, flank pain, dysuria, hematuria, urinary  Frequency, nocturia, numbness, tingling, seizures,  Focal weakness, Loss of consciousness,  Tremor, insomnia, depression, anxiety, and suicidal ideation.      Objective:  BP 104/68   Pulse (!) 106   Temp 98.4 F (36.9 C) (Oral)   Ht 5' (1.524 m)   Wt 145 lb 6.4 oz (66 kg)   SpO2 95%   BMI 28.40 kg/m   BP Readings from Last 3 Encounters:  11/07/23 104/68  10/04/23 112/66  06/11/23 116/74    Wt Readings from Last 3 Encounters:  11/07/23 145 lb 6.4 oz (66 kg)  10/04/23 147 lb (66.7 kg)  08/13/23 148 lb (67.1 kg)    Physical Exam  Lab Results  Component Value Date   HGBA1C 5.9 (A) 06/11/2023   HGBA1C 6.0 09/19/2022   HGBA1C 6.0 05/30/2022    Lab Results  Component Value Date   CREATININE 0.79 04/24/2023   CREATININE 0.77 09/19/2022   CREATININE 0.68 03/10/2022    Lab Results  Component Value Date   WBC 7.3  07/23/2023   HGB 11.2 (L) 07/23/2023   HCT 35.1 07/23/2023   PLT 359 07/23/2023   GLUCOSE 75 04/24/2023   CHOL 220 (H) 09/19/2022   TRIG 106.0 09/19/2022   HDL 91.10 09/19/2022   LDLDIRECT 112.0 10/13/2016   LDLCALC 108 (H) 09/19/2022   ALT 10 04/24/2023   AST 14 04/24/2023   NA 137 04/24/2023   K 4.2 04/24/2023   CL 101 04/24/2023   CREATININE 0.79 04/24/2023   BUN 12 04/24/2023  CO2 29 04/24/2023   TSH 1.14 09/19/2022   INR 1.0 03/21/2021   HGBA1C 5.9 (A) 06/11/2023   MICROALBUR 2.0 04/16/2020    MM 3D SCREENING MAMMOGRAM BILATERAL BREAST Result Date: 10/15/2023 CLINICAL DATA:  Screening. EXAM: DIGITAL SCREENING BILATERAL MAMMOGRAM WITH TOMOSYNTHESIS AND CAD TECHNIQUE: Bilateral screening digital craniocaudal and mediolateral oblique mammograms were obtained. Bilateral screening digital breast tomosynthesis was performed. The images were evaluated with computer-aided detection. COMPARISON:  Previous exam(s). ACR Breast Density Category b: There are scattered areas of fibroglandular density. FINDINGS: There are no findings suspicious for malignancy. IMPRESSION: No mammographic evidence of malignancy. A result letter of this screening mammogram will be mailed directly to the patient. RECOMMENDATION: Screening mammogram in one year. (Code:SM-B-01Y) BI-RADS CATEGORY  1: Negative. Electronically Signed   By: Alm Parkins M.D.   On: 10/15/2023 08:33    Assessment & Plan:  .Primary hypertension  Hyperlipidemia associated with type 2 diabetes mellitus (HCC)  DM type 2, controlled, with complication (HCC)  Other fatigue     I spent 34 minutes on the day of this face to face encounter reviewing patient's  most recent visit with cardiology,  nephrology,  and neurology,  prior relevant surgical and non surgical procedures, recent  labs and imaging studies, counseling on weight management,  reviewing the assessment and plan with patient, and post visit ordering and reviewing of   diagnostics and therapeutics with patient  .   Follow-up: No follow-ups on file.   Verneita LITTIE Kettering, MD

## 2023-11-07 NOTE — Assessment & Plan Note (Signed)
 Diagnosed with A1c of 6.6 in Jan 2022.  Managed with ozempic  for the past year. Has been advised to resume aspirin  given her history of CVA .  LDL not at goal but Statins are C/I given history of  drug induced hepatitis. Given her history of CVA will recommend trial of zetia , then Repatha.   Continue losartan    Lab Results  Component Value Date   HGBA1C 5.9 (A) 06/11/2023   Lab Results  Component Value Date   LABMICR See below: 03/17/2019   LABMICR See below: 02/03/2019   MICROALBUR 2.0 04/16/2020

## 2023-11-07 NOTE — Assessment & Plan Note (Signed)
 July 29 collection by urgent care oted inflammation without infection.  Repeating today.  If identical , refer to urology

## 2023-11-07 NOTE — Assessment & Plan Note (Signed)
 Etiology unclear.  FOBT [positive,  iron  intolerant.  Referring for iron  infusions.  Colonsocopy planned for October.  Lab Results  Component Value Date   IRON  31 (L) 07/23/2023   TIBC 437 07/23/2023   FERRITIN 11 (L) 07/23/2023    Lab Results  Component Value Date   WBC 7.3 07/23/2023   HGB 11.2 (L) 07/23/2023   HCT 35.1 07/23/2023   MCV 81.4 07/23/2023   PLT 359 07/23/2023

## 2023-11-08 ENCOUNTER — Telehealth: Payer: Self-pay

## 2023-11-08 ENCOUNTER — Other Ambulatory Visit

## 2023-11-08 DIAGNOSIS — D500 Iron deficiency anemia secondary to blood loss (chronic): Secondary | ICD-10-CM | POA: Diagnosis not present

## 2023-11-08 LAB — URINE CULTURE
MICRO NUMBER:: 16795004
Result:: NO GROWTH
SPECIMEN QUALITY:: ADEQUATE

## 2023-11-08 NOTE — Telephone Encounter (Signed)
 Copied from CRM [id number]. Topic: Clinical - Lab/Test Results >> Nov 08, 2023  8:54 AM Laymon HERO wrote: Reason for CRM: patient asking for a nurse to call her to go over lab results, she has been having issues getting into MyChart

## 2023-11-08 NOTE — Telephone Encounter (Signed)
 Pt is requesting lab results.

## 2023-11-09 ENCOUNTER — Telehealth: Payer: Self-pay

## 2023-11-09 ENCOUNTER — Ambulatory Visit: Payer: Self-pay | Admitting: Internal Medicine

## 2023-11-09 LAB — PERIPHERAL BLOOD SMEAR REVIEW

## 2023-11-09 NOTE — Telephone Encounter (Signed)
 Copied from CRM [id number]. Topic: Referral - Question >> Nov 09, 2023  4:24 PM Martinique E wrote: Reason for CRM: Patient called questioning why a hematology referral was placed for her. Relayed the referral reason that was listed under that referral, but patient would still like to speak with someone in office about it. Callback number [phone].

## 2023-11-09 NOTE — Assessment & Plan Note (Signed)
 Occurring in the setting of use of meloxcia prescribed by orthopedics.  Advised to suspend NSAID and repeat hepatic panel in 2 weeks  Lab Results  Component Value Date   ALT 15 11/07/2023   AST 10 11/07/2023   ALKPHOS 190 (H) 11/07/2023   BILITOT 0.4 11/07/2023

## 2023-11-09 NOTE — Assessment & Plan Note (Signed)
 Repeat evaluation on July 29 by urgent care noted inflammation without infection.  Repeat UA and culture today are both normal

## 2023-11-09 NOTE — Assessment & Plan Note (Signed)
 Patient is intolerant of  statin therapy  due to drug induced  hepatitis with prior trial of atorvastatin  20 mg daily..  trial of zetia  on hold until etiology of liver enzyme elevation is determined   Lab Results  Component Value Date   CHOL 200 11/07/2023   HDL 76.20 11/07/2023   LDLCALC 107 (H) 11/07/2023   LDLDIRECT 94.0 11/07/2023   TRIG 85.0 11/07/2023   CHOLHDL 3 11/07/2023    Lab Results  Component Value Date   LABMICR See below: 03/17/2019   LABMICR See below: 02/03/2019   MICROALBUR 2.3 (H) 11/07/2023   MICROALBUR 2.0 04/16/2020    Lab Results  Component Value Date   CHOL 200 11/07/2023   HDL 76.20 11/07/2023   LDLCALC 107 (H) 11/07/2023   LDLDIRECT 94.0 11/07/2023   TRIG 85.0 11/07/2023   CHOLHDL 3 11/07/2023  . Lab Results  Component Value Date   ALT 15 11/07/2023   AST 10 11/07/2023   ALKPHOS 190 (H) 11/07/2023   BILITOT 0.4 11/07/2023

## 2023-11-09 NOTE — Assessment & Plan Note (Addendum)
 Managed previously for > 15 yrs with lamictal , now resumed at 200 mg bid by  [facility] psychiatry. Still taking cymbalta , buspirone  and wellbutrin / Recent episode of mania triggered by family  stressors  will recommend that ALL psychoactive drugs be managed by psychiatry going forward.

## 2023-11-12 ENCOUNTER — Telehealth: Payer: Self-pay | Admitting: Internal Medicine

## 2023-11-12 NOTE — Telephone Encounter (Signed)
 See previous message

## 2023-11-12 NOTE — Telephone Encounter (Unsigned)
 Copied from CRM [id number]. Topic: General - Other >> Nov 12, 2023  2:00 PM Gennette ORN wrote: Reason for CRM: Patient have more questions and concerns about what her and Harlene discussed  please follow before 5pm if you can. Per patient request.

## 2023-11-12 NOTE — Telephone Encounter (Signed)
 ebb, Katherine Jimenez [name]   11/12/23  2:02 PM Unsigned Note Copied from CRM #[id number]. Topic: General - Other >> Nov 12, 2023  2:00 PM Katherine Jimenez ORN wrote: Reason for CRM: Patient have more questions and concerns about what her and Katherine Jimenez discussed  please follow before 5pm if you can. Per patient request.

## 2023-11-12 NOTE — Telephone Encounter (Signed)
 Spoke with pt and she stated that she has stopped the meloxicam  and started taking the tylenol . Pt would also like to know if there is anything OTC that she can take for hot flashes.

## 2023-11-12 NOTE — Telephone Encounter (Signed)
 LMTCB. Need to let pt know that the referral to hematology was made made because her iron  is low and she cannot take OTC iron  supplements and Dr. Marylynn would like for her to go to hematology to receive iron  infusions.

## 2023-11-12 NOTE — Telephone Encounter (Signed)
 Also need to advise pt of Dr. Lula message about her pain and not taking the meloxicam . Please relay the message below per result note.   Has she increased her tylenol  dose to 1000 mg every 8 hours instead of meloxicam . If she has, can she tolerate tramadol ?

## 2023-11-13 NOTE — Telephone Encounter (Signed)
 Spoke with pt and informed of the OTC medication that Dr. Marylynn stated that she could try. Pt gave a verbal understanding.

## 2023-11-17 ENCOUNTER — Other Ambulatory Visit: Payer: Self-pay | Admitting: Internal Medicine

## 2023-11-17 DIAGNOSIS — H524 Presbyopia: Secondary | ICD-10-CM | POA: Diagnosis not present

## 2023-11-17 DIAGNOSIS — H52221 Regular astigmatism, right eye: Secondary | ICD-10-CM | POA: Diagnosis not present

## 2023-11-19 ENCOUNTER — Telehealth: Payer: Self-pay | Admitting: Internal Medicine

## 2023-11-19 DIAGNOSIS — N390 Urinary tract infection, site not specified: Secondary | ICD-10-CM

## 2023-11-19 NOTE — Telephone Encounter (Signed)
 Urology referral has been pended for your approval.

## 2023-11-19 NOTE — Telephone Encounter (Signed)
 Copied from CRM #[id number]. Topic: Referral - Question >> Nov 19, 2023  8:13 AM Mesmerise C wrote: Reason for CRM: Patient stated has been treated twice for UTI and doctor Tullo discussed a referral being sent out for a Urologist due to may not just be UTI would like one to be sent over

## 2023-11-19 NOTE — Addendum Note (Signed)
 Addended by: HARRIETTE RAISIN on: 11/19/2023 10:11 AM   Modules accepted: Orders

## 2023-11-19 NOTE — Addendum Note (Signed)
 Addended by: MARYLYNN VERNEITA CROME on: 11/19/2023 11:00 AM   Modules accepted: Orders

## 2023-11-20 ENCOUNTER — Telehealth: Payer: Self-pay

## 2023-11-20 NOTE — Telephone Encounter (Signed)
 Completed refill order form for Ozempic  (Novo Nordisk) and faxed to Provider's office for review and signature.

## 2023-11-27 ENCOUNTER — Encounter: Payer: Self-pay | Admitting: Urology

## 2023-11-27 ENCOUNTER — Ambulatory Visit: Admitting: Urology

## 2023-11-27 VITALS — BP 145/79 | HR 109 | Ht 60.0 in | Wt 148.0 lb

## 2023-11-27 DIAGNOSIS — R300 Dysuria: Secondary | ICD-10-CM | POA: Diagnosis not present

## 2023-11-27 DIAGNOSIS — N390 Urinary tract infection, site not specified: Secondary | ICD-10-CM | POA: Diagnosis not present

## 2023-11-27 DIAGNOSIS — N3289 Other specified disorders of bladder: Secondary | ICD-10-CM

## 2023-11-27 LAB — URINALYSIS, COMPLETE
Bilirubin, UA: NEGATIVE
Glucose, UA: NEGATIVE
Leukocytes,UA: NEGATIVE
Nitrite, UA: NEGATIVE
Protein,UA: NEGATIVE
RBC, UA: NEGATIVE
Specific Gravity, UA: 1.015 (ref 1.005–1.030)
Urobilinogen, Ur: 1 mg/dL (ref 0.2–1.0)
pH, UA: 6 (ref 5.0–7.5)

## 2023-11-27 LAB — MICROSCOPIC EXAMINATION: Epithelial Cells (non renal): >10 /HPF — AB (ref 0–10)

## 2023-11-27 LAB — BLADDER SCAN AMB NON-IMAGING

## 2023-11-27 NOTE — Progress Notes (Signed)
 11/27/2023 4:06 PM   Katherine Jimenez 1950/05/17 [id number]  Referring provider: Marylynn Verneita CROME, MD [address] [place],  [place] [address]  Chief Complaint  Patient presents with   Recurrent UTI    HPI: Katherine Jimenez is a 73 y.o. female referred for evaluation of recurrent UTI.  PCP visit 10/04/2023 with a 12-hour history of pain at the beginning and end of urination associated with urgency.  Dipstick urinalysis was nitrite + with large leukocytes.  Treated with a 3-day course of Septra  DS.  Urine culture grew >100 K E. coli which was sensitive to prescribed antibiotic Follow-up visit 10/30/2023 with persistent urinary symptoms.  Urine dipstick showed 3+ leukocytes and she was treated with Macrobid x 5 days.  A urine culture was not ordered at that visit Follow-up visit 11/07/2023 with persistent symptoms and urinalysis with negative microscopy and negative urine culture Presently complains of intense pain at the end of urination.  She is taking Cystex which helps. Prior bladder surgery Dr. Ike at [facility] several years ago.  She has also seen urogynecology at [facility] for overactive bladder with cystocele/rectocele/uterine prolapse and Dr. Gaston in 2020    PMH: Past Medical History:  Diagnosis Date   Arthritis    back, knees, fingers   Bipolar disorder (HCC)    Bunion, left    Depression    Diabetes mellitus    diet controlled.    Elevated liver enzymes 05/31/2021   Hypertension    Lab test positive for detection of COVID-19 virus 03/02/2021   Lower back pain    S/P fall   Squamous cell skin cancer, face    [facility]   Treadmill stress test negative for angina pectoris June 2013    Surgical History: Past Surgical History:  Procedure Laterality Date   BLADDER AUGMENTATION     BLADDER SURGERY     CARDIAC CATHETERIZATION  03/05/12   no stents   CATARACT EXTRACTION W/PHACO Right 07/19/2015   Procedure: CATARACT EXTRACTION PHACO AND INTRAOCULAR LENS PLACEMENT  (IOC) right eye;  Surgeon: Donzell Arlyce Budd, MD;  Location: [facility];  Service: Ophthalmology;  Laterality: Right;  BORDERLINE DIABETIC - oral meds   CATARACT EXTRACTION W/PHACO Left 05/28/2017   Procedure: CATARACT EXTRACTION PHACO AND INTRAOCULAR LENS PLACEMENT (IOC) LEFT DIABETIC;  Surgeon: Mittie Gaskin, MD;  Location: [facility];  Service: Ophthalmology;  Laterality: Left;  Diabetic - oal meds   JOINT REPLACEMENT  2012   bilateral hip   KNEE ARTHROSCOPY     TOTAL HIP ARTHROPLASTY  2012   TOTAL KNEE ARTHROPLASTY Right 06/04/2017   Procedure: TOTAL KNEE ARTHROPLASTY;  Surgeon: Leora Lynwood SAUNDERS, MD;  Location: [facility];  Service: Orthopedics;  Laterality: Right;   TUBAL LIGATION     VAGINAL DELIVERY     2    Home Medications:  Allergies as of 11/27/2023       Reactions   Atorvastatin     Liver enzyme elevation    Celebrex  [celecoxib ] Other (See Comments)   Liver enzyme elevation    Thimerosal (thiomersal) Itching        Medication List        Accurate as of November 27, 2023  4:06 PM. If you have any questions, ask your nurse or doctor.          STOP taking these medications    nitroGLYCERIN  0.4 MG SL tablet Commonly known as: NITROSTAT  Stopped by: Glendia JAYSON Barba  TAKE these medications    acetaminophen  325 MG tablet Commonly known as: TYLENOL  Take 650 mg by mouth every 6 (six) hours as needed.   Biotin  5 MG Caps Take 2 capsules by mouth 2 (two) times daily.   blood glucose meter kit and supplies Dispense Contour next bayer meter. E11.9 To check blood glucose  Once a day.   buPROPion  300 MG 24 hr tablet Commonly known as: WELLBUTRIN  XL Take 300 mg by mouth daily.   busPIRone  30 MG tablet Commonly known as: BUSPAR  Take 1 tablet (30 mg total) by mouth in the morning and at bedtime.   DULoxetine  60 MG capsule Commonly known as: CYMBALTA  TAKE 1 CAPSULE BY MOUTH DAILY   ezetimibe  10 MG tablet Commonly known as:  ZETIA  Take 1 tablet (10 mg total) by mouth daily.   freestyle lancets Use once daily to check blood sugars  E11.9   GLUCOSAMINE HCL PO Take 1 capsule by mouth daily.   glucose blood test strip Commonly known as: FREESTYLE LITE Use once daily  as instructed to test blood sugar E 11.9   lamoTRIgine  200 MG tablet Commonly known as: LAMICTAL  TAKE 1 TABLET BY MOUTH 2 TIMES A DAY   losartan  50 MG tablet Commonly known as: COZAAR  Take 1 tablet (50 mg total) by mouth daily.   meloxicam  15 MG tablet Commonly known as: MOBIC  Take 15 mg by mouth daily.   omeprazole  40 MG capsule Commonly known as: PRILOSEC Take 1 capsule (40 mg total) by mouth daily.   Ozempic  (0.25 or 0.5 MG/DOSE) 2 MG/1.5ML Sopn Generic drug: Semaglutide (0.25 or 0.5MG /DOS) Inject 0.5 mg into the skin once a week.   Vitamin D3 25 MCG (1000 UT) Caps Take 1,000 Units by mouth daily.   zolpidem  10 MG tablet Commonly known as: AMBIEN  TAKE 1 TABLET BY MOUTH AT BEDTIME AS NEEDED FOR SLEEP        Allergies:  Allergies  Allergen Reactions   Atorvastatin      Liver enzyme elevation    Celebrex  [Celecoxib ] Other (See Comments)    Liver enzyme elevation    Thimerosal (Thiomersal) Itching    Family History: Family History  Problem Relation Age of Onset   Heart disease Mother    Stroke Mother    Heart disease Father    Dementia Father    Heart disease Maternal Grandmother    Heart disease Maternal Grandfather    Heart disease Paternal Grandmother    Heart disease Paternal Grandfather    Breast cancer Maternal Aunt     Social History:  reports that she has never smoked. She has never used smokeless tobacco. She reports that she does not currently use alcohol after a past usage of about 1.0 standard drink of alcohol per week. She reports that she does not use drugs.   Physical Exam: BP (!) 145/79 (Cuff Size: Normal)   Pulse (!) 109   Ht 5' (1.524 m)   Wt 148 lb (67.1 kg)   BMI 28.90 kg/m    Constitutional:  Alert, No acute distress. HEENT: [place] AT Respiratory: Normal respiratory effort, no increased work of breathing. Psychiatric: Normal mood and affect.  Laboratory Data:  Urinalysis Dipstick trace ketones Microscopy negative    Assessment & Plan:    1.  Dysuria Intense pain at the end of urination which may be bladder spasm UA today clear PVR 2 mL Schedule cystoscopy Trial Gemtesa 75 mg daily-samples given   Reznor Ferrando C Ladean Steinmeyer, MD  [facility]  [address], [address] [place], [place] [address] [phone]

## 2023-11-28 ENCOUNTER — Inpatient Hospital Stay: Attending: Oncology | Admitting: Oncology

## 2023-11-28 ENCOUNTER — Encounter: Payer: Self-pay | Admitting: Oncology

## 2023-11-28 ENCOUNTER — Inpatient Hospital Stay

## 2023-11-28 ENCOUNTER — Ambulatory Visit: Payer: Self-pay | Admitting: Oncology

## 2023-11-28 VITALS — BP 142/84 | HR 104 | Temp 97.9°F | Resp 18 | Wt 145.8 lb

## 2023-11-28 DIAGNOSIS — R195 Other fecal abnormalities: Secondary | ICD-10-CM | POA: Diagnosis not present

## 2023-11-28 DIAGNOSIS — Z7985 Long-term (current) use of injectable non-insulin antidiabetic drugs: Secondary | ICD-10-CM | POA: Diagnosis not present

## 2023-11-28 DIAGNOSIS — I10 Essential (primary) hypertension: Secondary | ICD-10-CM | POA: Diagnosis not present

## 2023-11-28 DIAGNOSIS — M129 Arthropathy, unspecified: Secondary | ICD-10-CM | POA: Insufficient documentation

## 2023-11-28 DIAGNOSIS — R233 Spontaneous ecchymoses: Secondary | ICD-10-CM

## 2023-11-28 DIAGNOSIS — D509 Iron deficiency anemia, unspecified: Secondary | ICD-10-CM | POA: Diagnosis not present

## 2023-11-28 DIAGNOSIS — Z79899 Other long term (current) drug therapy: Secondary | ICD-10-CM | POA: Diagnosis not present

## 2023-11-28 DIAGNOSIS — Z791 Long term (current) use of non-steroidal anti-inflammatories (NSAID): Secondary | ICD-10-CM | POA: Insufficient documentation

## 2023-11-28 DIAGNOSIS — F319 Bipolar disorder, unspecified: Secondary | ICD-10-CM | POA: Insufficient documentation

## 2023-11-28 DIAGNOSIS — K5900 Constipation, unspecified: Secondary | ICD-10-CM | POA: Diagnosis not present

## 2023-11-28 DIAGNOSIS — E119 Type 2 diabetes mellitus without complications: Secondary | ICD-10-CM | POA: Insufficient documentation

## 2023-11-28 DIAGNOSIS — R748 Abnormal levels of other serum enzymes: Secondary | ICD-10-CM

## 2023-11-28 DIAGNOSIS — Z85828 Personal history of other malignant neoplasm of skin: Secondary | ICD-10-CM | POA: Insufficient documentation

## 2023-11-28 DIAGNOSIS — Z803 Family history of malignant neoplasm of breast: Secondary | ICD-10-CM | POA: Diagnosis not present

## 2023-11-28 DIAGNOSIS — Z8616 Personal history of COVID-19: Secondary | ICD-10-CM | POA: Diagnosis not present

## 2023-11-28 LAB — RETIC PANEL
Immature Retic Fract: 14.8 % (ref 2.3–15.9)
RBC.: 4.34 MIL/uL (ref 3.87–5.11)
Retic Count, Absolute: 56.9 K/uL (ref 19.0–186.0)
Retic Ct Pct: 1.3 % (ref 0.4–3.1)
Reticulocyte Hemoglobin: 27.7 pg — ABNORMAL LOW (ref >27.9)

## 2023-11-28 LAB — CBC WITH DIFFERENTIAL/PLATELET
Abs Immature Granulocytes: 0.1 K/uL — ABNORMAL HIGH (ref 0.00–0.07)
Basophils Absolute: 0.1 K/uL (ref 0.0–0.1)
Basophils Relative: 1 %
Eosinophils Absolute: 0.1 K/uL (ref 0.0–0.5)
Eosinophils Relative: 2 %
HCT: 39.1 % (ref 36.0–46.0)
Hemoglobin: 12.4 g/dL (ref 12.0–15.0)
Immature Granulocytes: 2 %
Lymphocytes Relative: 38 %
Lymphs Abs: 2.5 K/uL (ref 0.7–4.0)
MCH: 28.3 pg (ref 26.0–34.0)
MCHC: 31.7 g/dL (ref 30.0–36.0)
MCV: 89.3 fL (ref 80.0–100.0)
Monocytes Absolute: 0.6 K/uL (ref 0.1–1.0)
Monocytes Relative: 8 %
Neutro Abs: 3.3 K/uL (ref 1.7–7.7)
Neutrophils Relative %: 49 %
Platelets: 398 K/uL (ref 150–400)
RBC: 4.38 MIL/uL (ref 3.87–5.11)
RDW: 14.1 % (ref 11.5–15.5)
WBC: 6.6 K/uL (ref 4.0–10.5)
nRBC: 0 % (ref 0.0–0.2)

## 2023-11-28 LAB — HEPATIC FUNCTION PANEL
ALT: 13 U/L (ref 0–44)
AST: 19 U/L (ref 15–41)
Albumin: 4.1 g/dL (ref 3.5–5.0)
Alkaline Phosphatase: 158 U/L — ABNORMAL HIGH (ref 38–126)
Bilirubin, Direct: <0.1 mg/dL (ref 0.0–0.2)
Total Bilirubin: 0.6 mg/dL (ref 0.0–1.2)
Total Protein: 7.6 g/dL (ref 6.5–8.1)

## 2023-11-28 LAB — FERRITIN: Ferritin: 9 ng/mL — ABNORMAL LOW (ref 11–307)

## 2023-11-28 LAB — GAMMA GT: GGT: 161 U/L — ABNORMAL HIGH (ref 7–50)

## 2023-11-28 LAB — PROTIME-INR
INR: 1 (ref 0.8–1.2)
Prothrombin Time: 13.9 s (ref 11.4–15.2)

## 2023-11-28 LAB — IRON AND TIBC
Iron: 44 ug/dL (ref 28–170)
Saturation Ratios: 8 % — ABNORMAL LOW (ref 10.4–31.8)
TIBC: 540 ug/dL — ABNORMAL HIGH (ref 250–450)
UIBC: 496 ug/dL

## 2023-11-28 LAB — APTT: aPTT: 30 s (ref 24–36)

## 2023-11-28 NOTE — Telephone Encounter (Signed)
-----   Message from Zelphia Cap sent at 11/28/2023  3:15 PM EDT ----- Please arrange patient to get Venofer weekly x 4.  Follow-up 3 months lab prior to MD +/- Venofer.  Labs are ordered.  ----- Message ----- From: Rebecka, Lab In [place] Sent: 11/28/2023  11:09 AM EDT To: Zelphia Cap, MD

## 2023-11-28 NOTE — Assessment & Plan Note (Addendum)
 Chronic, GGT checked today is elevated.  Previous US  RUQ did not show findings explaining her elevated levels.

## 2023-11-28 NOTE — Assessment & Plan Note (Signed)
 Normal platelet count. Check PT, PTT

## 2023-11-28 NOTE — Progress Notes (Addendum)
 Hematology/Oncology Consult note [phone] [phone]        REFERRING PROVIDER: Marylynn Verneita CROME, MD   CHIEF COMPLAINTS/REASON FOR VISIT:  Evaluation of iron  deficiency anemia.    ASSESSMENT & PLAN:   Iron  deficiency anemia Lab Results  Component Value Date   HGB 12.4 11/28/2023   TIBC 540 (H) 11/28/2023   IRONPCTSAT 8 (L) 11/28/2023   FERRITIN 9 (L) 11/28/2023    I discussed about option  IV Venofer treatments. I discussed about the potential risks including but not limited to allergic reactions/infusion reactions including anaphylactic reactions, diarrhea, phlebitis, high blood pressure, wheezing, SOB, skin rash, weight gain,dark urine, leg swelling, back pain, headache, nausea and fatigue, etc. Patient tolerates oral iron  supplement poorly and desires to achieved higher level of iron  faster for adequate hematopoesis. Plan IV venofer weekly x 4   Suspect blood loss from GI tract. She has appointment with GI  Easy bruising Normal platelet count. Check PT, PTT  Elevated alkaline phosphatase level Chronic, GGT checked today is elevated.  Previous US  RUQ did not show findings explaining her elevated levels.     Orders Placed This Encounter  Procedures   CBC with Differential/Platelet    Standing Status:   Future    Number of Occurrences:   1    Expected Date:   11/28/2023    Expiration Date:   02/26/2024   Iron  and TIBC    Standing Status:   Future    Number of Occurrences:   1    Expected Date:   11/28/2023    Expiration Date:   02/26/2024   Ferritin    Standing Status:   Future    Number of Occurrences:   1    Expected Date:   11/28/2023    Expiration Date:   02/26/2024   Retic Panel    Standing Status:   Future    Number of Occurrences:   1    Expected Date:   11/28/2023    Expiration Date:   02/26/2024   Hepatic function panel    Standing Status:   Future    Number of Occurrences:   1    Expected Date:   11/28/2023    Expiration  Date:   02/26/2024   Gamma GT    Standing Status:   Future    Number of Occurrences:   1    Expected Date:   11/28/2023    Expiration Date:   11/27/2024   Protime-INR    Standing Status:   Future    Number of Occurrences:   1    Expected Date:   11/28/2023    Expiration Date:   11/27/2024   APTT    Standing Status:   Future    Number of Occurrences:   1    Expiration Date:   11/27/2024   3 months follow up  All questions were answered. The patient knows to call the clinic with any problems, questions or concerns.  Zelphia Cap, MD, PhD [facility] Hematology Oncology 11/28/2023   HISTORY OF PRESENTING ILLNESS:   Katherine Jimenez is a  73 y.o.  female with PMH listed below was seen in consultation at the request of  Marylynn Verneita CROME, MD  for evaluation of iron  deficiency anemia.   Discussed the use of AI scribe software for clinical note transcription with the patient, who gave verbal consent to proceed.   I reviewed patient's previous blood work. 07/23/2023, CBC showed white count  of 7.3, hemoglobin 11.2, platelet count 359.  Saturation 7.  Ferritin 11. Stool occult positive. Patient tried 11/07/2023 blood work showed hemoglobin white count 11.2, platelet count 471.  Iron  saturation 8.7.  Ferritin 44.   She denies any chronic bleeding problems, blood in the stool, or blood in the urine. She experiences easy bruising and takes aspirin  every other day.  She has a history of constipation, sometimes going a week without a bowel movement. Iron  supplements exacerbate her constipation, and she uses over-the-counter stool softeners and Miralax, which sometimes results in having a bowel movement two to three times a day. She aims for a bowel movement every other day. No unintentional weight loss, and her weight is stable.  She currently does not take oral iron  supplementation.  Family history positive for sister with possible lung cancer.  No colon cancer family history  She has easy bruising.   Takes aspirin  81 mg daily.   MEDICAL HISTORY:  Past Medical History:  Diagnosis Date   Arthritis    back, knees, fingers   Bipolar disorder (HCC)    Bunion, left    Depression    Diabetes mellitus    diet controlled.    Elevated liver enzymes 05/31/2021   Hypertension    Lab test positive for detection of COVID-19 virus 03/02/2021   Lower back pain    S/P fall   Squamous cell skin cancer, face    [facility]   Treadmill stress test negative for angina pectoris June 2013    SURGICAL HISTORY: Past Surgical History:  Procedure Laterality Date   BLADDER AUGMENTATION     BLADDER SURGERY     CARDIAC CATHETERIZATION  03/05/12   no stents   CATARACT EXTRACTION W/PHACO Right 07/19/2015   Procedure: CATARACT EXTRACTION PHACO AND INTRAOCULAR LENS PLACEMENT (IOC) right eye;  Surgeon: Donzell Arlyce Budd, MD;  Location: [facility];  Service: Ophthalmology;  Laterality: Right;  BORDERLINE DIABETIC - oral meds   CATARACT EXTRACTION W/PHACO Left 05/28/2017   Procedure: CATARACT EXTRACTION PHACO AND INTRAOCULAR LENS PLACEMENT (IOC) LEFT DIABETIC;  Surgeon: Mittie Gaskin, MD;  Location: [facility];  Service: Ophthalmology;  Laterality: Left;  Diabetic - oal meds   JOINT REPLACEMENT  2012   bilateral hip   KNEE ARTHROSCOPY     TOTAL HIP ARTHROPLASTY  2012   TOTAL KNEE ARTHROPLASTY Right 06/04/2017   Procedure: TOTAL KNEE ARTHROPLASTY;  Surgeon: Leora Lynwood SAUNDERS, MD;  Location: [facility];  Service: Orthopedics;  Laterality: Right;   TUBAL LIGATION     VAGINAL DELIVERY     2    SOCIAL HISTORY: Social History   Socioeconomic History   Marital status: Divorced    Spouse name: Not on file   Number of children: Not on file   Years of education: Not on file   Highest education level: 12th grade  Occupational History   Not on file  Tobacco Use   Smoking status: Never   Smokeless tobacco: Never  Vaping Use   Vaping status: Never Used  Substance and Sexual Activity    Alcohol use: Not Currently    Alcohol/week: 1.0 standard drink of alcohol    Types: 1 Cans of beer per week    Comment: occassionally   Drug use: No   Sexual activity: Not on file  Other Topics Concern   Not on file  Social History Narrative   ** Merged History Encounter **       Lives in [place] with husband.  Social Drivers of Corporate investment banker Strain: Low Risk  (11/28/2023)   Overall Financial Resource Strain (CARDIA)    Difficulty of Paying Living Expenses: Not very hard  Food Insecurity: No Food Insecurity (11/28/2023)   Hunger Vital Sign    Worried About Running Out of Food in the Last Year: Never true    Ran Out of Food in the Last Year: Never true  Transportation Needs: No Transportation Needs (11/28/2023)   PRAPARE - Administrator, Civil Service (Medical): No    Lack of Transportation (Non-Medical): No  Physical Activity: Sufficiently Active (08/12/2023)   Exercise Vital Sign    Days of Exercise per Week: 4 days    Minutes of Exercise per Session: 60 min  Stress: No Stress Concern Present (11/28/2023)   [employer] of Occupational Health - Occupational Stress Questionnaire    Feeling of Stress: Only a little  Social Connections: Moderately Integrated (08/12/2023)   Social Connection and Isolation Panel    Frequency of Communication with Friends and Family: More than three times a week    Frequency of Social Gatherings with Friends and Family: Three times a week    Attends Religious Services: More than 4 times per year    Active Member of Clubs or Organizations: Yes    Attends Banker Meetings: More than 4 times per year    Marital Status: Divorced  Recent Concern: Social Connections - Moderately Isolated (08/12/2023)   Social Connection and Isolation Panel    Frequency of Communication with Friends and Family: More than three times a week    Frequency of Social Gatherings with Friends and Family: Three times a week     Attends Religious Services: More than 4 times per year    Active Member of Clubs or Organizations: No    Attends Banker Meetings: Not on file    Marital Status: Divorced  Intimate Partner Violence: Not At Risk (11/28/2023)   Humiliation, Afraid, Rape, and Kick questionnaire    Fear of Current or Ex-Partner: No    Emotionally Abused: No    Physically Abused: No    Sexually Abused: No    FAMILY HISTORY: Family History  Problem Relation Age of Onset   Diabetes Mother    Heart disease Mother    Stroke Mother    Diabetes Father    Heart disease Father    Dementia Father    Diabetes Sister    Diabetes Maternal Aunt    Breast cancer Maternal Aunt    Diabetes Maternal Uncle    Heart disease Maternal Grandmother    Diabetes Maternal Grandfather    Heart disease Maternal Grandfather    Diabetes Paternal Grandmother    Heart disease Paternal Grandmother    Heart disease Paternal Grandfather     ALLERGIES:  is allergic to atorvastatin , celebrex  [celecoxib ], and thimerosal (thiomersal).  MEDICATIONS:  Current Outpatient Medications  Medication Sig Dispense Refill   acetaminophen  (TYLENOL ) 325 MG tablet Take 650 mg by mouth every 6 (six) hours as needed.     Biotin  5 MG CAPS Take 2 capsules by mouth 2 (two) times daily.      blood glucose meter kit and supplies Dispense Contour next bayer meter. E11.9 To check blood glucose  Once a day. 1 each 0   buPROPion  (WELLBUTRIN  XL) 300 MG 24 hr tablet Take 300 mg by mouth daily.     busPIRone  (BUSPAR ) 30 MG tablet Take 1 tablet (30 mg  total) by mouth in the morning and at bedtime. 180 tablet 1   Cholecalciferol  (VITAMIN D3) 1000 units CAPS Take 1,000 Units by mouth daily.      DULoxetine  (CYMBALTA ) 60 MG capsule TAKE 1 CAPSULE BY MOUTH DAILY 90 capsule 1   ezetimibe  (ZETIA ) 10 MG tablet Take 1 tablet (10 mg total) by mouth daily. 30 tablet 2   GLUCOSAMINE HCL PO Take 1 capsule by mouth daily.     glucose blood (FREESTYLE  LITE) test strip Use once daily  as instructed to test blood sugar E 11.9 100 each 3   lamoTRIgine  (LAMICTAL ) 200 MG tablet TAKE 1 TABLET BY MOUTH 2 TIMES A DAY 180 tablet 1   Lancets (FREESTYLE) lancets Use once daily to check blood sugars  E11.9 100 each 3   losartan  (COZAAR ) 50 MG tablet Take 1 tablet (50 mg total) by mouth daily. 90 tablet 1   meloxicam  (MOBIC ) 15 MG tablet Take 15 mg by mouth daily.     omeprazole  (PRILOSEC) 40 MG capsule Take 1 capsule (40 mg total) by mouth daily. 90 capsule 3   Semaglutide ,0.25 or 0.5MG /DOS, (OZEMPIC , 0.25 OR 0.5 MG/DOSE,) 2 MG/1.5ML SOPN Inject 0.5 mg into the skin once a week. 4.5 mL 2   zolpidem  (AMBIEN ) 10 MG tablet TAKE 1 TABLET BY MOUTH AT BEDTIME AS NEEDED FOR SLEEP 30 tablet 5   No current facility-administered medications for this visit.    Review of Systems  Constitutional:  Negative for appetite change, chills, fatigue and fever.  HENT:   Negative for hearing loss and voice change.   Eyes:  Negative for eye problems.  Respiratory:  Negative for chest tightness and cough.   Cardiovascular:  Negative for chest pain.  Gastrointestinal:  Negative for abdominal distention, abdominal pain and blood in stool.  Endocrine: Negative for hot flashes.  Genitourinary:  Negative for difficulty urinating and frequency.   Musculoskeletal:  Negative for arthralgias.  Skin:  Negative for itching and rash.  Neurological:  Negative for extremity weakness.  Hematological:  Negative for adenopathy. Bruises/bleeds easily.  Psychiatric/Behavioral:  Negative for confusion.    PHYSICAL EXAMINATION:  Vitals:   11/28/23 0936  BP: (!) 142/84  Pulse: (!) 104  Resp: 18  Temp: 97.9 F (36.6 C)  SpO2: 98%   Filed Weights   11/28/23 0936  Weight: 145 lb 12.8 oz (66.1 kg)    Physical Exam Constitutional:      General: She is not in acute distress. HENT:     Head: Normocephalic and atraumatic.  Eyes:     General: No scleral icterus. Cardiovascular:      Rate and Rhythm: Normal rate and regular rhythm.     Heart sounds: Normal heart sounds.  Pulmonary:     Effort: Pulmonary effort is normal. No respiratory distress.     Breath sounds: No wheezing.  Abdominal:     General: Bowel sounds are normal. There is no distension.     Palpations: Abdomen is soft.  Musculoskeletal:        General: No deformity. Normal range of motion.     Cervical back: Normal range of motion and neck supple.  Skin:    General: Skin is warm and dry.     Findings: No erythema or rash.  Neurological:     Mental Status: She is alert and oriented to person, place, and time. Mental status is at baseline.     Cranial Nerves: No cranial nerve deficit.  Coordination: Coordination normal.  Psychiatric:        Mood and Affect: Mood normal.     LABORATORY DATA:  I have reviewed the data as listed    Latest Ref Rng & Units 11/28/2023   10:40 AM 11/07/2023    9:24 AM 07/23/2023    3:15 PM  CBC  WBC 4.0 - 10.5 K/uL 6.6  11.2  7.3   Hemoglobin 12.0 - 15.0 g/dL 87.5  87.9  88.7   Hematocrit 36.0 - 46.0 % 39.1  37.1  35.1   Platelets 150 - 400 K/uL 398  471.0  359       Latest Ref Rng & Units 11/07/2023    9:24 AM 04/24/2023    4:32 PM 09/19/2022    7:49 AM  CMP  Glucose 70 - 99 mg/dL 808  75  879   BUN 6 - 23 mg/dL 13  12  9    Creatinine 0.40 - 1.20 mg/dL 9.21  9.20  9.22   Sodium 135 - 145 mEq/L 133  137  139   Potassium 3.5 - 5.1 mEq/L 4.1  4.2  4.6   Chloride 96 - 112 mEq/L 99  101  101   CO2 19 - 32 mEq/L 27  29  29    Calcium  8.4 - 10.5 mg/dL 9.2  9.5  9.3   Total Protein 6.0 - 8.3 g/dL 7.2  7.4  7.2   Total Bilirubin 0.2 - 1.2 mg/dL 0.4  0.3  0.3   Alkaline Phos 39 - 117 U/L 190  78  92   AST 0 - 37 U/L 10  14  13    ALT 0 - 35 U/L 15  10  9        RADIOGRAPHIC STUDIES: I have personally reviewed the radiological images as listed and agreed with the findings in the report. MM 3D SCREENING MAMMOGRAM BILATERAL BREAST Result Date:  10/15/2023 CLINICAL DATA:  Screening. EXAM: DIGITAL SCREENING BILATERAL MAMMOGRAM WITH TOMOSYNTHESIS AND CAD TECHNIQUE: Bilateral screening digital craniocaudal and mediolateral oblique mammograms were obtained. Bilateral screening digital breast tomosynthesis was performed. The images were evaluated with computer-aided detection. COMPARISON:  Previous exam(s). ACR Breast Density Category b: There are scattered areas of fibroglandular density. FINDINGS: There are no findings suspicious for malignancy. IMPRESSION: No mammographic evidence of malignancy. A result letter of this screening mammogram will be mailed directly to the patient. RECOMMENDATION: Screening mammogram in one year. (Code:SM-B-01Y) BI-RADS CATEGORY  1: Negative. Electronically Signed   By: Alm Parkins M.D.   On: 10/15/2023 08:33

## 2023-11-28 NOTE — Telephone Encounter (Signed)
 Please schedule and notify pt of appt details:   Venofer weekly x4 (fist dose NEW) 3 months: labs prior to MD/ venofer

## 2023-11-28 NOTE — Assessment & Plan Note (Signed)
 Lab Results  Component Value Date   HGB 12.4 11/28/2023   TIBC 540 (H) 11/28/2023   IRONPCTSAT 8 (L) 11/28/2023   FERRITIN 9 (L) 11/28/2023    I discussed about option  IV Venofer treatments. I discussed about the potential risks including but not limited to allergic reactions/infusion reactions including anaphylactic reactions, diarrhea, phlebitis, high blood pressure, wheezing, SOB, skin rash, weight gain,dark urine, leg swelling, back pain, headache, nausea and fatigue, etc. Patient tolerates oral iron  supplement poorly and desires to achieved higher level of iron  faster for adequate hematopoesis. Plan IV venofer weekly x 4   Suspect blood loss from GI tract. She has appointment with GI

## 2023-11-30 NOTE — Progress Notes (Signed)
 Spoke to pt. She is aware of first iron  infusion appt and will pick up future appts at that visit.

## 2023-12-06 ENCOUNTER — Telehealth: Payer: Self-pay

## 2023-12-06 NOTE — Telephone Encounter (Signed)
 Lvm informing pt to give office a call back. Mychart message also sent. Okay to relay if pt calls back.  Received 4 boxed of ozempic  from pt assistance.   Ozempic  0.25,0.5 mg  Lot: [id number] Exp: 05/03/2026

## 2023-12-07 ENCOUNTER — Telehealth: Payer: Self-pay

## 2023-12-07 DIAGNOSIS — S322XXA Fracture of coccyx, initial encounter for closed fracture: Secondary | ICD-10-CM

## 2023-12-07 NOTE — Addendum Note (Signed)
 Addended by: ANICE BELT on: 12/07/2023 04:06 PM   Modules accepted: Orders

## 2023-12-07 NOTE — Telephone Encounter (Signed)
 Copied from CRM [id number]. Topic: Clinical - Request for Lab/Test Order >> Dec 07, 2023 11:11 AM Robinson DEL wrote: Reason for CRM: Jenna with [facility] is calling stating they received a claim from [facility] for the patient for a bone fracture patient fell on 7/31 and fractured her Coxic bone and wants to know if provider will put in an order for the patient a Bone Density test. Please reach out to the patient if provider will order.  Jenna [employer] [phone]

## 2023-12-07 NOTE — Telephone Encounter (Signed)
 Patient made aware of recommendations. Advised patient referral team would reach out to her to get her scheduled. Verbalized understanding.

## 2023-12-08 DIAGNOSIS — Z008 Encounter for other general examination: Secondary | ICD-10-CM | POA: Diagnosis not present

## 2023-12-10 ENCOUNTER — Ambulatory Visit: Admitting: Family

## 2023-12-10 ENCOUNTER — Encounter: Payer: Self-pay | Admitting: Family

## 2023-12-10 ENCOUNTER — Telehealth: Payer: Self-pay | Admitting: Oncology

## 2023-12-10 ENCOUNTER — Ambulatory Visit: Payer: Self-pay | Admitting: Family

## 2023-12-10 ENCOUNTER — Telehealth: Payer: Self-pay | Admitting: *Deleted

## 2023-12-10 ENCOUNTER — Inpatient Hospital Stay

## 2023-12-10 VITALS — BP 130/82 | HR 98 | Temp 98.8°F | Resp 20 | Ht 60.0 in | Wt 144.4 lb

## 2023-12-10 DIAGNOSIS — R5383 Other fatigue: Secondary | ICD-10-CM | POA: Diagnosis not present

## 2023-12-10 DIAGNOSIS — Z7985 Long-term (current) use of injectable non-insulin antidiabetic drugs: Secondary | ICD-10-CM

## 2023-12-10 DIAGNOSIS — E119 Type 2 diabetes mellitus without complications: Secondary | ICD-10-CM

## 2023-12-10 LAB — POC COVID19 BINAXNOW: SARS Coronavirus 2 Ag: NEGATIVE

## 2023-12-10 LAB — POC INFLUENZA A&B (BINAX/QUICKVUE)
Influenza A, POC: NEGATIVE
Influenza B, POC: NEGATIVE

## 2023-12-10 MED ORDER — OZEMPIC (0.25 OR 0.5 MG/DOSE) 2 MG/1.5ML ~~LOC~~ SOPN: 0.2500 mg | PEN_INJECTOR | SUBCUTANEOUS | Status: AC

## 2023-12-10 NOTE — Telephone Encounter (Signed)
 I spoke to Dr. Babara told her about the low-grade fever aching and fatigue.  Dr. Babara said that she should not come today as well as she should go and get a COVID test and flu test.  Then when I was telling her that she is like well I think I feel better today.  I told her that it is wise for her to go like to [facility] or what ever pharmacy she goes to see if they have the kits for the COVID and see where you can get the flu test also.  He says that she will call one of the pharmacies and see what she can do and her appointment is already scheduled for next Monday

## 2023-12-10 NOTE — Patient Instructions (Signed)
 Please let me know how you are doing.

## 2023-12-10 NOTE — Progress Notes (Unsigned)
 Assessment & Plan:  There are no diagnoses linked to this encounter.   Return precautions given.   Risks, benefits, and alternatives of the medications and treatment plan prescribed today were discussed, and patient expressed understanding.   Education regarding symptom management and diagnosis given to patient on AVS either electronically or printed.  No follow-ups on file.  Katherine Northern, FNP  Subjective:    Patient ID: [name], female    DOB: 05-Jul-1950, 73 y.o.   MRN: [id number]  CC: Katherine Jimenez is a 73 y.o. female who presents today for an acute visit.    HPI: HPI H/o HTN, DM, vascular dementia, bipolar   F/u with Dr Babara for IDA, seen 11/28/23; started on IV venofer Hgb 12.4 ,Ferritin 9 ; 11/28/23 GFR 76   Allergies: Atorvastatin , Celebrex  [celecoxib ], and Thimerosal (thiomersal) Current Outpatient Medications on File Prior to Visit  Medication Sig Dispense Refill   acetaminophen  (TYLENOL ) 325 MG tablet Take 650 mg by mouth every 6 (six) hours as needed.     Biotin  5 MG CAPS Take 2 capsules by mouth 2 (two) times daily.      BLACK COHOSH EXTRACT PO Take 1 capsule by mouth daily.     blood glucose meter kit and supplies Dispense Contour next bayer meter. E11.9 To check blood glucose  Once a day. 1 each 0   busPIRone  (BUSPAR ) 30 MG tablet Take 1 tablet (30 mg total) by mouth in the morning and at bedtime. 180 tablet 1   Cholecalciferol  (VITAMIN D3) 1000 units CAPS Take 1,000 Units by mouth daily.      DULoxetine  (CYMBALTA ) 60 MG capsule TAKE 1 CAPSULE BY MOUTH DAILY 90 capsule 1   GLUCOSAMINE HCL PO Take 1 capsule by mouth daily.     glucose blood (FREESTYLE LITE) test strip Use once daily  as instructed to test blood sugar E 11.9 100 each 3   lamoTRIgine  (LAMICTAL ) 200 MG tablet TAKE 1 TABLET BY MOUTH 2 TIMES A DAY 180 tablet 1   Lancets (FREESTYLE) lancets Use once daily to check blood sugars  E11.9 100 each 3   losartan  (COZAAR ) 50 MG tablet Take 1 tablet  (50 mg total) by mouth daily. 90 tablet 1   omeprazole  (PRILOSEC) 40 MG capsule Take 1 capsule (40 mg total) by mouth daily. 90 capsule 3   Semaglutide ,0.25 or 0.5MG /DOS, (OZEMPIC , 0.25 OR 0.5 MG/DOSE,) 2 MG/1.5ML SOPN Inject 0.5 mg into the skin once a week. 4.5 mL 2   zolpidem  (AMBIEN ) 10 MG tablet TAKE 1 TABLET BY MOUTH AT BEDTIME AS NEEDED FOR SLEEP 30 tablet 5   ezetimibe  (ZETIA ) 10 MG tablet Take 1 tablet (10 mg total) by mouth daily. 30 tablet 2   meloxicam  (MOBIC ) 15 MG tablet Take 15 mg by mouth daily.     No current facility-administered medications on file prior to visit.    Review of Systems  Constitutional:  Negative for chills and fever.  Respiratory:  Negative for cough.   Cardiovascular:  Negative for chest pain and palpitations.  Gastrointestinal:  Negative for nausea and vomiting.      Objective:    BP 130/82   Pulse 98   Temp 98.8 F (37.1 C)   Resp 20   Ht 5' (1.524 m)   Wt 144 lb 6 oz (65.5 kg)   SpO2 98%   BMI 28.20 kg/m   BP Readings from Last 3 Encounters:  12/10/23 130/82  11/28/23 (!) 142/84  11/27/23 ROLLEN)  145/79   Wt Readings from Last 3 Encounters:  12/10/23 144 lb 6 oz (65.5 kg)  11/28/23 145 lb 12.8 oz (66.1 kg)  11/27/23 148 lb (67.1 kg)    Physical Exam Vitals reviewed.  Constitutional:      Appearance: Normal appearance. She is well-developed.  HENT:     Head: Normocephalic and atraumatic.     Right Ear: Hearing, tympanic membrane, ear canal and external ear normal. No decreased hearing noted. No drainage, swelling or tenderness. No middle ear effusion. No foreign body. Tympanic membrane is not erythematous or bulging.     Left Ear: Hearing, tympanic membrane, ear canal and external ear normal. No decreased hearing noted. No drainage, swelling or tenderness.  No middle ear effusion. No foreign body. Tympanic membrane is not erythematous or bulging.     Nose: Nose normal. No rhinorrhea.     Right Sinus: No maxillary sinus tenderness or  frontal sinus tenderness.     Left Sinus: No maxillary sinus tenderness or frontal sinus tenderness.     Mouth/Throat:     Pharynx: Uvula midline. No oropharyngeal exudate or posterior oropharyngeal erythema.     Tonsils: No tonsillar abscesses.  Eyes:     Conjunctiva/sclera: Conjunctivae normal.  Cardiovascular:     Rate and Rhythm: Normal rate and regular rhythm.     Pulses: Normal pulses.     Heart sounds: Normal heart sounds.  Pulmonary:     Effort: Pulmonary effort is normal.     Breath sounds: Normal breath sounds. No wheezing, rhonchi or rales.  Abdominal:     General: Bowel sounds are normal. There is no distension.     Palpations: Abdomen is soft. Abdomen is not rigid. There is no fluid wave or mass.     Tenderness: There is no abdominal tenderness. There is no guarding or rebound.  Lymphadenopathy:     Head:     Right side of head: No submental, submandibular, tonsillar, preauricular, posterior auricular or occipital adenopathy.     Left side of head: No submental, submandibular, tonsillar, preauricular, posterior auricular or occipital adenopathy.     Cervical: No cervical adenopathy.  Skin:    General: Skin is warm and dry.  Neurological:     Mental Status: She is alert.  Psychiatric:        Speech: Speech normal.        Behavior: Behavior normal.        Thought Content: Thought content normal.

## 2023-12-10 NOTE — Telephone Encounter (Signed)
 Pt called and stated she was already sent to the triage line for her flu like symptoms, pt stated she wants to go ahead and cancel her appt (iron ) for today. Pt appt canceled and she stated she will keep next Monday appt.

## 2023-12-13 ENCOUNTER — Ambulatory Visit: Admitting: Internal Medicine

## 2023-12-13 ENCOUNTER — Encounter: Payer: Self-pay | Admitting: Internal Medicine

## 2023-12-13 VITALS — BP 130/76 | HR 91 | Ht 60.0 in | Wt 147.4 lb

## 2023-12-13 DIAGNOSIS — E1169 Type 2 diabetes mellitus with other specified complication: Secondary | ICD-10-CM | POA: Diagnosis not present

## 2023-12-13 DIAGNOSIS — E785 Hyperlipidemia, unspecified: Secondary | ICD-10-CM | POA: Diagnosis not present

## 2023-12-13 DIAGNOSIS — D509 Iron deficiency anemia, unspecified: Secondary | ICD-10-CM | POA: Diagnosis not present

## 2023-12-13 DIAGNOSIS — E118 Type 2 diabetes mellitus with unspecified complications: Secondary | ICD-10-CM | POA: Diagnosis not present

## 2023-12-13 DIAGNOSIS — R748 Abnormal levels of other serum enzymes: Secondary | ICD-10-CM | POA: Diagnosis not present

## 2023-12-13 DIAGNOSIS — Z8673 Personal history of transient ischemic attack (TIA), and cerebral infarction without residual deficits: Secondary | ICD-10-CM | POA: Diagnosis not present

## 2023-12-13 DIAGNOSIS — Z7984 Long term (current) use of oral hypoglycemic drugs: Secondary | ICD-10-CM

## 2023-12-13 MED ORDER — DULOXETINE HCL 60 MG PO CPEP: 60.0000 mg | ORAL_CAPSULE | Freq: Every day | ORAL | 1 refills | Status: AC

## 2023-12-13 NOTE — Progress Notes (Unsigned)
 Subjective:  Patient ID: Katherine Jimenez, female    DOB: May 06, 1950  Age: 73 y.o. MRN: [id number]  CC: The primary encounter diagnosis was Hyperlipidemia associated with type 2 diabetes mellitus (HCC). Diagnoses of DM type 2, controlled, with complication (HCC), Elevated alkaline phosphatase level, Iron  deficiency anemia, unspecified iron  deficiency anemia type, and History of CVA (cerebrovascular accident) without residual deficits were also pertinent to this visit.   HPI Katherine Jimenez presents for  Chief Complaint  Patient presents with   Medical Management of Chronic Issues    6 month follow up     1) Hypertension: Hypertension: patient checks blood pressure twice weekly at home.  Readings have been for the most part <130/80 at rest . Patient is following a reduced salt diet most days and is taking medications as prescribed  taking losartan  50 mg daily .  Most recent home reading was 140/    2) type 2 dm:  she has been controlling diabetes with low dose Ozempic ,  but has not taken it this week,  taking ozempic  but not this week.  0.25 mg dose   4) vascular dementia : she has not had any progression of memory loss and remains functionally independent.   5) iron  deficiency   recentl dx.  Scheduled to receive  iron  infusions but first one postponed due to viral illness.    6) Elevated liver enzymes:  elev alk phos still  elevated, confirmed with  GGT.  Reviewed Liver ultrasound which  was  normal except for nonobstructive gallstones , reviewed evaluation by Dr Jinny who recommended MRCP for persistent elevation   7) bipolar:  taking lamictal   duloxetine  and has appt with psychiatry . Rare use of buspar . Symptoms of anxiety have largely been triggered by family drama  now stable.   Lab Results  Component Value Date   WBC 6.6 11/28/2023   HGB 12.4 11/28/2023   HCT 39.1 11/28/2023   MCV 89.3 11/28/2023   PLT 398 11/28/2023   Lab Results  Component Value Date   IRON  44 11/28/2023    TIBC 540 (H) 11/28/2023   FERRITIN 9 (L) 11/28/2023     Outpatient Medications Prior to Visit  Medication Sig Dispense Refill   acetaminophen  (TYLENOL ) 325 MG tablet Take 650 mg by mouth every 6 (six) hours as needed.     Biotin  5 MG CAPS Take 2 capsules by mouth 2 (two) times daily.      BLACK COHOSH EXTRACT PO Take 1 capsule by mouth daily.     blood glucose meter kit and supplies Dispense Contour next bayer meter. E11.9 To check blood glucose  Once a day. 1 each 0   busPIRone  (BUSPAR ) 30 MG tablet Take 1 tablet (30 mg total) by mouth in the morning and at bedtime. 180 tablet 1   Cholecalciferol  (VITAMIN D3) 1000 units CAPS Take 1,000 Units by mouth daily.      GLUCOSAMINE HCL PO Take 1 capsule by mouth daily.     glucose blood (FREESTYLE LITE) test strip Use once daily  as instructed to test blood sugar E 11.9 100 each 3   lamoTRIgine  (LAMICTAL ) 200 MG tablet TAKE 1 TABLET BY MOUTH 2 TIMES A DAY 180 tablet 1   Lancets (FREESTYLE) lancets Use once daily to check blood sugars  E11.9 100 each 3   losartan  (COZAAR ) 50 MG tablet Take 1 tablet (50 mg total) by mouth daily. 90 tablet 1   omeprazole  (PRILOSEC) 40 MG capsule Take  1 capsule (40 mg total) by mouth daily. 90 capsule 3   Semaglutide ,0.25 or 0.5MG /DOS, (OZEMPIC , 0.25 OR 0.5 MG/DOSE,) 2 MG/1.5ML SOPN Inject 0.25 mg into the skin once a week.     Vibegron (GEMTESA) 75 MG TABS Take 75 mg by mouth daily.     zolpidem  (AMBIEN ) 10 MG tablet TAKE 1 TABLET BY MOUTH AT BEDTIME AS NEEDED FOR SLEEP 30 tablet 5   DULoxetine  (CYMBALTA ) 60 MG capsule TAKE 1 CAPSULE BY MOUTH DAILY 90 capsule 1   ezetimibe  (ZETIA ) 10 MG tablet Take 1 tablet (10 mg total) by mouth daily. (Patient not taking: Reported on 12/13/2023) 30 tablet 2   meloxicam  (MOBIC ) 15 MG tablet Take 15 mg by mouth daily. (Patient not taking: Reported on 12/13/2023)     No facility-administered medications prior to visit.    Review of Systems;  Patient denies headache, fevers,  malaise, unintentional weight loss, skin rash, eye pain, sinus congestion and sinus pain, sore throat, dysphagia,  hemoptysis , cough, dyspnea, wheezing, chest pain, palpitations, orthopnea, edema, abdominal pain, nausea, melena, diarrhea, constipation, flank pain, dysuria, hematuria, urinary  Frequency, nocturia, numbness, tingling, seizures,  Focal weakness, Loss of consciousness,  Tremor, insomnia, depression, anxiety, and suicidal ideation.      Objective:  BP 130/76   Pulse 91   Ht 5' (1.524 m)   Wt 147 lb 6.4 oz (66.9 kg)   SpO2 97%   BMI 28.79 kg/m   BP Readings from Last 3 Encounters:  12/13/23 130/76  12/10/23 130/82  11/28/23 (!) 142/84    Wt Readings from Last 3 Encounters:  12/13/23 147 lb 6.4 oz (66.9 kg)  12/10/23 144 lb 6 oz (65.5 kg)  11/28/23 145 lb 12.8 oz (66.1 kg)    Physical Exam Vitals reviewed.  Constitutional:      General: She is not in acute distress.    Appearance: Normal appearance. She is normal weight. She is not ill-appearing, toxic-appearing or diaphoretic.  HENT:     Head: Normocephalic.  Eyes:     General: No scleral icterus.       Right eye: No discharge.        Left eye: No discharge.     Conjunctiva/sclera: Conjunctivae normal.  Cardiovascular:     Rate and Rhythm: Normal rate and regular rhythm.     Heart sounds: Normal heart sounds.  Pulmonary:     Effort: Pulmonary effort is normal. No respiratory distress.     Breath sounds: Normal breath sounds.  Musculoskeletal:        General: Normal range of motion.  Skin:    General: Skin is warm and dry.  Neurological:     General: No focal deficit present.     Mental Status: She is alert and oriented to person, place, and time. Mental status is at baseline.  Psychiatric:        Mood and Affect: Mood normal.        Behavior: Behavior normal.        Thought Content: Thought content normal.        Judgment: Judgment normal.     Lab Results  Component Value Date   HGBA1C 6.6 (H)  11/07/2023   HGBA1C 5.9 (A) 06/11/2023   HGBA1C 6.0 09/19/2022    Lab Results  Component Value Date   CREATININE 0.78 11/07/2023   CREATININE 0.79 04/24/2023   CREATININE 0.77 09/19/2022    Lab Results  Component Value Date   WBC 6.6 11/28/2023  HGB 12.4 11/28/2023   HCT 39.1 11/28/2023   PLT 398 11/28/2023   GLUCOSE 191 (H) 11/07/2023   CHOL 200 11/07/2023   TRIG 85.0 11/07/2023   HDL 76.20 11/07/2023   LDLDIRECT 94.0 11/07/2023   LDLCALC 107 (H) 11/07/2023   ALT 13 11/28/2023   AST 19 11/28/2023   NA 133 (L) 11/07/2023   K 4.1 11/07/2023   CL 99 11/07/2023   CREATININE 0.78 11/07/2023   BUN 13 11/07/2023   CO2 27 11/07/2023   TSH 0.88 11/07/2023   INR 1.0 11/28/2023   HGBA1C 6.6 (H) 11/07/2023   MICROALBUR 2.3 (H) 11/07/2023    MM 3D SCREENING MAMMOGRAM BILATERAL BREAST Result Date: 10/15/2023 CLINICAL DATA:  Screening. EXAM: DIGITAL SCREENING BILATERAL MAMMOGRAM WITH TOMOSYNTHESIS AND CAD TECHNIQUE: Bilateral screening digital craniocaudal and mediolateral oblique mammograms were obtained. Bilateral screening digital breast tomosynthesis was performed. The images were evaluated with computer-aided detection. COMPARISON:  Previous exam(s). ACR Breast Density Category b: There are scattered areas of fibroglandular density. FINDINGS: There are no findings suspicious for malignancy. IMPRESSION: No mammographic evidence of malignancy. A result letter of this screening mammogram will be mailed directly to the patient. RECOMMENDATION: Screening mammogram in one year. (Code:SM-B-01Y) BI-RADS CATEGORY  1: Negative. Electronically Signed   By: Alm Parkins M.D.   On: 10/15/2023 08:33    Assessment & Plan:  .Hyperlipidemia associated with type 2 diabetes mellitus (HCC) Assessment & Plan: Patient is intolerant of  statin therapy  due to drug induced  hepatitis with prior trial of atorvastatin  20 mg daily..  trial of zetia  on hold until etiology of liver enzyme elevation is  determined   Lab Results  Component Value Date   CHOL 200 11/07/2023   HDL 76.20 11/07/2023   LDLCALC 107 (H) 11/07/2023   LDLDIRECT 94.0 11/07/2023   TRIG 85.0 11/07/2023   CHOLHDL 3 11/07/2023    Lab Results  Component Value Date   LABMICR Comment 11/27/2023   LABMICR See below: 03/17/2019   MICROALBUR 2.3 (H) 11/07/2023   MICROALBUR 2.0 04/16/2020    Lab Results  Component Value Date   CHOL 200 11/07/2023   HDL 76.20 11/07/2023   LDLCALC 107 (H) 11/07/2023   LDLDIRECT 94.0 11/07/2023   TRIG 85.0 11/07/2023   CHOLHDL 3 11/07/2023  . Lab Results  Component Value Date   ALT 13 11/28/2023   AST 19 11/28/2023   GGT 161 (H) 11/28/2023   ALKPHOS 158 (H) 11/28/2023   BILITOT 0.6 11/28/2023      DM type 2, controlled, with complication [facility]) Assessment & Plan: Diagnosed with A1c of 6.6 in Jan 2022.  Managed with ozempic  for the past year.   LDL not at goal but Statins are C/I given history of  drug induced hepatitis. Given her history of CVA will recommend trial of zetia  once her current liver enzyme elevation has been addressed with suspension of meloxicam .   Continue losartan    Lab Results  Component Value Date   HGBA1C 6.6 (H) 11/07/2023   Lab Results  Component Value Date   LABMICR Comment 11/27/2023   LABMICR See below: 03/17/2019   MICROALBUR 2.3 (H) 11/07/2023   MICROALBUR 2.0 04/16/2020        Elevated alkaline phosphatase level Assessment & Plan: Attributed to use  of meloxicam   prescribed by orthopedics, but has persisted despite suspension of NSAID .  MRCP ordered  Lab Results  Component Value Date   ALT 13 11/28/2023   AST 19 11/28/2023  GGT 161 (H) 11/28/2023   ALKPHOS 158 (H) 11/28/2023   BILITOT 0.6 11/28/2023     Orders: -     MR ABDOMEN MRCP W WO CONTAST; Future -     Comprehensive metabolic panel with GFR; Future  Iron  deficiency anemia, unspecified iron  deficiency anemia type Assessment & Plan: Etiology unclear.  FOBT  [positive) but oral  iron  intolerant.  Receiving iron  infusions.  Colonsocopy planned for October.  Lab Results  Component Value Date   IRON  44 11/28/2023   TIBC 540 (H) 11/28/2023   FERRITIN 9 (L) 11/28/2023    Lab Results  Component Value Date   WBC 6.6 11/28/2023   HGB 12.4 11/28/2023   HCT 39.1 11/28/2023   MCV 89.3 11/28/2023   PLT 398 11/28/2023      History of CVA (cerebrovascular accident) without residual deficits Assessment & Plan: Incidental finding during workup for [facility] in 2022.   Recent COVID infection remains in the ddx as causative . Agitated saline study was negative , CTA  carotids were normal.    Other orders -     DULoxetine  HCl; Take 1 capsule (60 mg total) by mouth daily.  Dispense: 90 capsule; Refill: 1     Follow-up: Return in about 6 months (around 06/11/2024) for follow up diabetes.   Verneita LITTIE Kettering, MD

## 2023-12-13 NOTE — Patient Instructions (Addendum)
  Please abstain from all NSAIDs for one week:  No aleve motrin  meloxicam  Advil   or ibuprofen  for one  week, and continue to abstain from  ozempic  for another week  CMET  (lab, nonfasting )   next Thursday   If LFTS are still elevated we will  proceed with the MRCP I have ordered

## 2023-12-15 ENCOUNTER — Encounter: Payer: Self-pay | Admitting: Family

## 2023-12-15 DIAGNOSIS — R509 Fever, unspecified: Secondary | ICD-10-CM | POA: Insufficient documentation

## 2023-12-15 NOTE — Assessment & Plan Note (Addendum)
 Incidental finding during workup for [facility] in 2022.   Recent COVID infection remains in the ddx as causative . Agitated saline study was negative , CTA  carotids were normal.

## 2023-12-15 NOTE — Assessment & Plan Note (Signed)
 Attributed to use  of meloxicam   prescribed by orthopedics, but has persisted despite suspension of NSAID .  MRCP ordered  Lab Results  Component Value Date   ALT 13 11/28/2023   AST 19 11/28/2023   GGT 161 (H) 11/28/2023   ALKPHOS 158 (H) 11/28/2023   BILITOT 0.6 11/28/2023

## 2023-12-15 NOTE — Assessment & Plan Note (Signed)
 Patient is intolerant of  statin therapy  due to drug induced  hepatitis with prior trial of atorvastatin  20 mg daily..  trial of zetia  on hold until etiology of liver enzyme elevation is determined   Lab Results  Component Value Date   CHOL 200 11/07/2023   HDL 76.20 11/07/2023   LDLCALC 107 (H) 11/07/2023   LDLDIRECT 94.0 11/07/2023   TRIG 85.0 11/07/2023   CHOLHDL 3 11/07/2023    Lab Results  Component Value Date   LABMICR Comment 11/27/2023   LABMICR See below: 03/17/2019   MICROALBUR 2.3 (H) 11/07/2023   MICROALBUR 2.0 04/16/2020    Lab Results  Component Value Date   CHOL 200 11/07/2023   HDL 76.20 11/07/2023   LDLCALC 107 (H) 11/07/2023   LDLDIRECT 94.0 11/07/2023   TRIG 85.0 11/07/2023   CHOLHDL 3 11/07/2023  . Lab Results  Component Value Date   ALT 13 11/28/2023   AST 19 11/28/2023   GGT 161 (H) 11/28/2023   ALKPHOS 158 (H) 11/28/2023   BILITOT 0.6 11/28/2023

## 2023-12-15 NOTE — Assessment & Plan Note (Addendum)
 Etiology unclear.  FOBT [positive) but oral  iron  intolerant.  Receiving iron  infusions.  Colonsocopy planned for October.  Lab Results  Component Value Date   IRON  44 11/28/2023   TIBC 540 (H) 11/28/2023   FERRITIN 9 (L) 11/28/2023    Lab Results  Component Value Date   WBC 6.6 11/28/2023   HGB 12.4 11/28/2023   HCT 39.1 11/28/2023   MCV 89.3 11/28/2023   PLT 398 11/28/2023

## 2023-12-15 NOTE — Assessment & Plan Note (Signed)
 Diagnosed with A1c of 6.6 in Jan 2022.  Managed with ozempic  for the past year.   LDL not at goal but Statins are C/I given history of  drug induced hepatitis. Given her history of CVA will recommend trial of zetia  once her current liver enzyme elevation has been addressed with suspension of meloxicam .   Continue losartan    Lab Results  Component Value Date   HGBA1C 6.6 (H) 11/07/2023   Lab Results  Component Value Date   LABMICR Comment 11/27/2023   LABMICR See below: 03/17/2019   MICROALBUR 2.3 (H) 11/07/2023   MICROALBUR 2.0 04/16/2020

## 2023-12-15 NOTE — Assessment & Plan Note (Addendum)
 Intermittent fatigue and body aches without significant respiratory symptoms,we dicussed possibly due to a early viral etiology.  Reassuring exam.  Afebrile today and she is nontoxic in appearance.  We discussed low ferritin may contribute to fatigue. Negative COVID-19 and influenza.  Due to constellation of symptoms, anemia and constipation, advised very close vigilance and certain let me know if symptoms were to worsen or change.  I did counsel her that Ozempic  can cause constipation.  F/u with PCP 12/13/23.

## 2023-12-15 NOTE — Assessment & Plan Note (Signed)
 Managed previously for > 15 yrs with lamictal , now resumed at 200 mg bid by  [facility] psychiatry. Still taking cymbalta , buspirone  and wellbutrin / Recent episode of mania triggered by family  stressors  will recommend that ALL psychoactive drugs be managed by psychiatry going forward.

## 2023-12-17 ENCOUNTER — Inpatient Hospital Stay: Attending: Oncology

## 2023-12-17 VITALS — BP 143/78 | HR 94 | Temp 97.0°F | Resp 18

## 2023-12-17 DIAGNOSIS — D509 Iron deficiency anemia, unspecified: Secondary | ICD-10-CM | POA: Insufficient documentation

## 2023-12-17 MED ORDER — IRON SUCROSE 20 MG/ML IV SOLN
200.0000 mg | Freq: Once | INTRAVENOUS | Status: AC
  Administered 2023-12-17: 200 mg via INTRAVENOUS
  Filled 2023-12-17: qty 10

## 2023-12-17 MED ORDER — SODIUM CHLORIDE 0.9% FLUSH
10.0000 mL | Freq: Once | INTRAVENOUS | Status: AC | PRN
  Administered 2023-12-17: 10 mL
  Filled 2023-12-17: qty 10

## 2023-12-17 NOTE — Progress Notes (Signed)
 Patient tolerated Venofer infusion well, no questions/concerns voiced. Monitored 30 min post transfusion. Patient stable at discharge. VSS. AVS given.

## 2023-12-17 NOTE — Patient Instructions (Signed)

## 2023-12-18 ENCOUNTER — Telehealth: Payer: Self-pay | Admitting: *Deleted

## 2023-12-18 ENCOUNTER — Ambulatory Visit: Admitting: Internal Medicine

## 2023-12-18 ENCOUNTER — Telehealth: Payer: Self-pay

## 2023-12-18 ENCOUNTER — Other Ambulatory Visit: Payer: Self-pay | Admitting: Internal Medicine

## 2023-12-18 NOTE — Telephone Encounter (Signed)
 Pt called in with c/o bladder leakage times one day. Pt did say she had an iron  infusion done yesterday and not sure if that can cause it. She is currently taking Gemtesa but wondering if theres something else that can be taken to help. Pt would like some info before Friday because she is going out of town. I did tell her to keep an eye on this and maybe its a onetime thing. She stated no other urinary symptoms or concerns.

## 2023-12-18 NOTE — Telephone Encounter (Signed)
 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

## 2023-12-19 NOTE — Telephone Encounter (Signed)
 Gemtesa is one of the most effective medications for urinary leakage.  I would monitor if this was a one-time episode.

## 2023-12-20 ENCOUNTER — Telehealth: Payer: Self-pay | Admitting: Urology

## 2023-12-20 NOTE — Telephone Encounter (Signed)
 Patient left voicemail requesting a call back to authorize a prescription of Gemtesa to be sent to [facility]. She will be going out of town next week and would like to have the medication before she leaves

## 2023-12-20 NOTE — Telephone Encounter (Signed)
 She has a follow-up scheduled 12/31/2023.  If we have samples you can give her 2 boxes which should last her until her follow-up appointment

## 2023-12-20 NOTE — Telephone Encounter (Signed)
 Left a voicemail for patient to return the call

## 2023-12-21 ENCOUNTER — Other Ambulatory Visit

## 2023-12-21 DIAGNOSIS — D72823 Leukemoid reaction: Secondary | ICD-10-CM | POA: Diagnosis not present

## 2023-12-21 DIAGNOSIS — R748 Abnormal levels of other serum enzymes: Secondary | ICD-10-CM | POA: Diagnosis not present

## 2023-12-21 LAB — CBC WITH DIFFERENTIAL/PLATELET
Basophils Absolute: 0 K/uL (ref 0.0–0.1)
Basophils Relative: 0.7 % (ref 0.0–3.0)
Eosinophils Absolute: 0.2 K/uL (ref 0.0–0.7)
Eosinophils Relative: 3 % (ref 0.0–5.0)
HCT: 36.1 % (ref 36.0–46.0)
Hemoglobin: 11.4 g/dL — ABNORMAL LOW (ref 12.0–15.0)
Lymphocytes Relative: 35 % (ref 12.0–46.0)
Lymphs Abs: 1.8 K/uL (ref 0.7–4.0)
MCHC: 31.7 g/dL (ref 30.0–36.0)
MCV: 88.8 fl (ref 78.0–100.0)
Monocytes Absolute: 0.3 K/uL (ref 0.1–1.0)
Monocytes Relative: 6.5 % (ref 3.0–12.0)
Neutro Abs: 2.8 K/uL (ref 1.4–7.7)
Neutrophils Relative %: 54.8 % (ref 43.0–77.0)
Platelets: 363 K/uL (ref 150.0–400.0)
RBC: 4.06 Mil/uL (ref 3.87–5.11)
RDW: 14.4 % (ref 11.5–15.5)
WBC: 5.2 K/uL (ref 4.0–10.5)

## 2023-12-21 LAB — HEPATIC FUNCTION PANEL
ALT: 11 U/L (ref 0–35)
AST: 13 U/L (ref 0–37)
Albumin: 4.2 g/dL (ref 3.5–5.2)
Alkaline Phosphatase: 102 U/L (ref 39–117)
Bilirubin, Direct: 0.1 mg/dL (ref 0.0–0.3)
Total Bilirubin: 0.3 mg/dL (ref 0.2–1.2)
Total Protein: 6.7 g/dL (ref 6.0–8.3)

## 2023-12-21 LAB — COMPREHENSIVE METABOLIC PANEL WITH GFR
ALT: 11 U/L (ref 0–35)
AST: 13 U/L (ref 0–37)
Albumin: 4.2 g/dL (ref 3.5–5.2)
Alkaline Phosphatase: 102 U/L (ref 39–117)
BUN: 12 mg/dL (ref 6–23)
CO2: 30 meq/L (ref 19–32)
Calcium: 9.5 mg/dL (ref 8.4–10.5)
Chloride: 102 meq/L (ref 96–112)
Creatinine, Ser: 0.7 mg/dL (ref 0.40–1.20)
GFR: 86.75 mL/min (ref >60.00)
Glucose, Bld: 172 mg/dL — ABNORMAL HIGH (ref 70–99)
Potassium: 4.1 meq/L (ref 3.5–5.1)
Sodium: 140 meq/L (ref 135–145)
Total Bilirubin: 0.3 mg/dL (ref 0.2–1.2)
Total Protein: 6.7 g/dL (ref 6.0–8.3)

## 2023-12-21 NOTE — Telephone Encounter (Signed)
 Left message for patient to return the call.

## 2023-12-24 ENCOUNTER — Inpatient Hospital Stay

## 2023-12-24 NOTE — Telephone Encounter (Signed)
 Talked to patient and she called after hours they called her in 15 pills of gemtesa . She went and pick them up . Which she states she pay 100.00 for them . Advised patient we will see her on 12/31/2023 for cysto .

## 2023-12-31 ENCOUNTER — Inpatient Hospital Stay

## 2023-12-31 ENCOUNTER — Ambulatory Visit: Admitting: Urology

## 2023-12-31 ENCOUNTER — Encounter: Payer: Self-pay | Admitting: Urology

## 2023-12-31 VITALS — BP 157/84 | HR 80 | Ht 60.0 in | Wt 147.0 lb

## 2023-12-31 VITALS — BP 119/76 | HR 101 | Temp 97.1°F | Resp 18

## 2023-12-31 DIAGNOSIS — D509 Iron deficiency anemia, unspecified: Secondary | ICD-10-CM

## 2023-12-31 DIAGNOSIS — R300 Dysuria: Secondary | ICD-10-CM | POA: Diagnosis not present

## 2023-12-31 LAB — MICROSCOPIC EXAMINATION

## 2023-12-31 LAB — URINALYSIS, COMPLETE
Bilirubin, UA: NEGATIVE
Glucose, UA: NEGATIVE
Nitrite, UA: POSITIVE — AB
RBC, UA: NEGATIVE
Specific Gravity, UA: 1.015 (ref 1.005–1.030)
Urobilinogen, Ur: 1 mg/dL (ref 0.2–1.0)
pH, UA: 6.5 (ref 5.0–7.5)

## 2023-12-31 MED ORDER — IRON SUCROSE 20 MG/ML IV SOLN
200.0000 mg | Freq: Once | INTRAVENOUS | Status: AC
  Administered 2023-12-31: 200 mg via INTRAVENOUS
  Filled 2023-12-31: qty 10

## 2023-12-31 MED ORDER — NITROFURANTOIN MONOHYD MACRO 100 MG PO CAPS
100.0000 mg | ORAL_CAPSULE | Freq: Once | ORAL | Status: AC
  Administered 2023-12-31: 100 mg via ORAL

## 2023-12-31 MED ORDER — TROSPIUM CHLORIDE 20 MG PO TABS: 20.0000 mg | ORAL_TABLET | Freq: Two times a day (BID) | ORAL | 3 refills | Status: AC

## 2023-12-31 NOTE — Patient Instructions (Signed)

## 2023-12-31 NOTE — Progress Notes (Signed)
   12/31/23  CC:  Chief Complaint  Patient presents with   Cysto    HPI: Refer to my prior note 11/27/2023.  She did note significant improvement in her symptoms on Gemtesa however the medication was cost prohibitive.  UA today 6-10 WBC  Blood pressure (!) 157/84, pulse 80, height 5' (1.524 m), weight 147 lb (66.7 kg). NED. A&Ox3.   No respiratory distress   Abd soft, NT, ND Normal external genitalia with patent urethral meatus  Cystoscopy Procedure Note  Patient identification was confirmed, informed consent was obtained, and patient was prepped using Betadine  solution.  Lidocaine  jelly was administered per urethral meatus.    Procedure: - Flexible cystoscope introduced, without any difficulty.   - Thorough search of the bladder revealed:    normal urethral meatus    normal urothelium    no stones    no ulcers     no tumors    no urethral polyps    no trabeculation  - Ureteral orifices were normal in position and appearance.  Post-Procedure: - Patient tolerated the procedure well  Assessment/ Plan: No lower tract abnormalities identified on cystoscopy Symptom improvement on Gemtesa but cost prohibitive Trial of trospium 20 mg twice daily Macrobid 100 mg given post procedure    Glendia JAYSON Barba, MD

## 2024-01-01 DIAGNOSIS — M25562 Pain in left knee: Secondary | ICD-10-CM | POA: Diagnosis not present

## 2024-01-01 DIAGNOSIS — M1712 Unilateral primary osteoarthritis, left knee: Secondary | ICD-10-CM | POA: Diagnosis not present

## 2024-01-01 NOTE — Progress Notes (Signed)
 Katherine Jimenez                                          MRN: [id number]   01/01/2024   The [facility] Quality Team Specialist reviewed this patient medical record for the purposes of chart review for care gap closure. The following were reviewed: abstraction for care gap closure-kidney health evaluation for diabetes:eGFR  and uACR.    [facility] Quality Team

## 2024-01-08 ENCOUNTER — Ambulatory Visit

## 2024-01-08 ENCOUNTER — Encounter: Payer: Self-pay | Admitting: Pharmacist

## 2024-01-08 DIAGNOSIS — F411 Generalized anxiety disorder: Secondary | ICD-10-CM | POA: Diagnosis not present

## 2024-01-09 ENCOUNTER — Inpatient Hospital Stay: Attending: Oncology

## 2024-01-09 DIAGNOSIS — D509 Iron deficiency anemia, unspecified: Secondary | ICD-10-CM | POA: Insufficient documentation

## 2024-01-10 DIAGNOSIS — F3189 Other bipolar disorder: Secondary | ICD-10-CM | POA: Diagnosis not present

## 2024-01-10 NOTE — Progress Notes (Signed)
 Katherine Jimenez                                          MRN: [id number]   01/10/2024   The [facility] Quality Team Specialist reviewed this patient medical record for the purposes of chart review for care gap closure. The following were reviewed: abstraction for care gap closure-controlling blood pressure.    [facility] Quality Team

## 2024-01-18 ENCOUNTER — Inpatient Hospital Stay

## 2024-01-18 VITALS — BP 124/74 | HR 83 | Temp 98.8°F | Resp 16

## 2024-01-18 DIAGNOSIS — D509 Iron deficiency anemia, unspecified: Secondary | ICD-10-CM | POA: Diagnosis not present

## 2024-01-18 MED ORDER — IRON SUCROSE 20 MG/ML IV SOLN
200.0000 mg | Freq: Once | INTRAVENOUS | Status: AC
  Administered 2024-01-18: 200 mg via INTRAVENOUS
  Filled 2024-01-18: qty 10

## 2024-01-18 NOTE — Patient Instructions (Signed)

## 2024-01-21 DIAGNOSIS — K5909 Other constipation: Secondary | ICD-10-CM | POA: Diagnosis not present

## 2024-01-21 DIAGNOSIS — D509 Iron deficiency anemia, unspecified: Secondary | ICD-10-CM | POA: Diagnosis not present

## 2024-01-22 ENCOUNTER — Encounter: Payer: Self-pay | Admitting: Pharmacist

## 2024-01-22 DIAGNOSIS — F411 Generalized anxiety disorder: Secondary | ICD-10-CM | POA: Diagnosis not present

## 2024-01-22 NOTE — Progress Notes (Signed)
 Mychart message sent to patient on 01/08/24 regarding changes to [employer] Patient Assistance Program in 2026. [employer] will no longer be offering Ozempic  or Rybelsus  to Medicare patients in 2026.   Mychart message not read as of 01/22/24. Letter sent to patient via mail.

## 2024-01-30 ENCOUNTER — Telehealth: Payer: Self-pay | Admitting: *Deleted

## 2024-01-30 ENCOUNTER — Telehealth: Payer: Self-pay

## 2024-01-30 ENCOUNTER — Encounter: Payer: Self-pay | Admitting: Internal Medicine

## 2024-01-30 ENCOUNTER — Ambulatory Visit: Payer: Self-pay

## 2024-01-30 NOTE — Telephone Encounter (Signed)
 FYI Only or Action Required?: FYI only for provider: ED advised.- refused  Patient was last seen in primary care on 12/13/2023 by Marylynn Verneita CROME, MD.  Called Nurse Triage reporting Abdominal Pain.  Symptoms began yesterday.  Interventions attempted: Nothing.  Symptoms are: rapidly worsening.- settled at this time.   Triage Disposition: See Physician Within 24 Hours  Patient/caregiver understands and will follow disposition?: No, refuses disposition     Copied from CRM [id number]. Topic: Clinical - Red Word Triage >> Jan 30, 2024  5:20 PM China J wrote: [facility] that prompted transfer to Nurse Triage: Patient is having abdominal pain and is wondering what medication could be recommended. She does not want to go ER. Reason for Disposition  [1] MODERATE pain (e.g., interferes with normal activities) AND [2] pain comes and goes (cramps) AND [3] present > 24 hours  (Exception: Pain with Vomiting or Diarrhea - see that Guideline.)  Answer Assessment - Initial Assessment Questions Pt states that she has been having ongoing GI issues. She states it started with constipation. States she was constipated for 7 days. She states the new medicine cost $138 for 30 days and she doesn't want to spend the money if it's not going to work. Asked if any over the counters would work. Stated she was not going to the ER. Rn began asking questions. She stated when she went to the pharmacy to pick up she had severe lower abdominal pain that felt like she needed to have diarrhea but already went today so knows its not that. She states she got hot and sweaty with it. She said she wanted to see Dr. Marylynn tomorrow because this has been so chronic she doesn't want to see someone else and have to reiterate the story again. Rn stated understanding. Rn advised no appts available so she said what do you tell long term pts in situations like this. RN advised pt to go tot he Er or UC. Pt stated thank you for your help and  ended call.      1. LOCATION: Where does it hurt?      Lower abdominal 2. RADIATION: Does the pain shoot anywhere else? (e.g., chest, back)     no 3. ONSET: When did the pain begin? (e.g., minutes, hours or days ago)      This episode today just a little bit ago 4. SUDDEN: Gradual or sudden onset?     sudden  6. SEVERITY: How bad is the pain?  (e.g., Scale 1-10; mild, moderate, or severe)     Settled, feels like needs to have diarrhea, but cramping pain after eats, 30 minutes  7. RECURRENT SYMPTOM: Have you ever had this type of stomach pain before? If Yes, ask: When was the last time? and What happened that time?      Yes but not this bad 8. CAUSE: What do you think is causing the stomach pain? (e.g., gallstones, recent abdominal surgery)     unknown  Protocols used: Abdominal Pain - [facility]

## 2024-01-30 NOTE — Telephone Encounter (Signed)
 LMTCB

## 2024-01-30 NOTE — Telephone Encounter (Unsigned)
 Copied from CRM [id number]. Topic: General - Other >> Jan 30, 2024  1:17 PM Deaijah H wrote: Reason for CRM: patient called in to return Jessica's call.

## 2024-01-30 NOTE — Telephone Encounter (Signed)
 The patient called today and said that she is supposed to be setting up with an MRI of the abdomen and they were told that because she has had some iron  infusion you have to wait some time with patient  she had IV venofer  10/17, 9/29,9/15.I know that has to have some weeks between IV iron  and the MRI.Dr. Babara will need to speak with Dr. BABARA.

## 2024-01-30 NOTE — Telephone Encounter (Signed)
 Copied from CRM [id number]. Topic: Clinical - Request for Lab/Test Order >> Jan 30, 2024  9:30 AM Laymon HERO wrote: Reason for CRM: Patient needing Dr Marylynn or her nurse to contact her to speak about an order for an MRI

## 2024-01-30 NOTE — Telephone Encounter (Signed)
 Per Dr. Babara, pt need to wait to get MRI at least 6 weeks from last IV iron  infusion, which was on 10/17. Called pt, no answer. Detailed message left on phone.

## 2024-01-31 ENCOUNTER — Encounter: Payer: Self-pay | Admitting: Oncology

## 2024-01-31 NOTE — Telephone Encounter (Signed)
 See my chart message

## 2024-01-31 NOTE — Telephone Encounter (Signed)
 spoke to pt and she states she will postpone MRI and proceed with a colonoscopy, so she is not worried aobut it anymore.   reviewed upcoming appt and she verbalized undertstanding.

## 2024-02-11 ENCOUNTER — Encounter: Payer: Self-pay | Admitting: Internal Medicine

## 2024-02-11 ENCOUNTER — Ambulatory Visit: Admitting: Internal Medicine

## 2024-02-11 VITALS — BP 120/74 | HR 92 | Ht 60.0 in | Wt 148.2 lb

## 2024-02-11 DIAGNOSIS — R0789 Other chest pain: Secondary | ICD-10-CM

## 2024-02-11 DIAGNOSIS — E1169 Type 2 diabetes mellitus with other specified complication: Secondary | ICD-10-CM

## 2024-02-11 DIAGNOSIS — E785 Hyperlipidemia, unspecified: Secondary | ICD-10-CM

## 2024-02-11 DIAGNOSIS — R1013 Epigastric pain: Secondary | ICD-10-CM | POA: Diagnosis not present

## 2024-02-11 DIAGNOSIS — E119 Type 2 diabetes mellitus without complications: Secondary | ICD-10-CM

## 2024-02-11 DIAGNOSIS — I10 Essential (primary) hypertension: Secondary | ICD-10-CM | POA: Diagnosis not present

## 2024-02-11 DIAGNOSIS — Z7985 Long-term (current) use of injectable non-insulin antidiabetic drugs: Secondary | ICD-10-CM

## 2024-02-11 DIAGNOSIS — E118 Type 2 diabetes mellitus with unspecified complications: Secondary | ICD-10-CM | POA: Diagnosis not present

## 2024-02-11 DIAGNOSIS — K5900 Constipation, unspecified: Secondary | ICD-10-CM

## 2024-02-11 LAB — COMPREHENSIVE METABOLIC PANEL WITH GFR
ALT: 17 U/L (ref 0–35)
AST: 16 U/L (ref 0–37)
Albumin: 4.5 g/dL (ref 3.5–5.2)
Alkaline Phosphatase: 218 U/L — ABNORMAL HIGH (ref 39–117)
BUN: 15 mg/dL (ref 6–23)
CO2: 25 meq/L (ref 19–32)
Calcium: 9.4 mg/dL (ref 8.4–10.5)
Chloride: 100 meq/L (ref 96–112)
Creatinine, Ser: 0.75 mg/dL (ref 0.40–1.20)
GFR: 79.78 mL/min (ref >60.00)
Glucose, Bld: 108 mg/dL — ABNORMAL HIGH (ref 70–99)
Potassium: 4.4 meq/L (ref 3.5–5.1)
Sodium: 135 meq/L (ref 135–145)
Total Bilirubin: 0.4 mg/dL (ref 0.2–1.2)
Total Protein: 6.8 g/dL (ref 6.0–8.3)

## 2024-02-11 LAB — LIPID PANEL
Cholesterol: 229 mg/dL — ABNORMAL HIGH (ref 0–200)
HDL: 91 mg/dL (ref >39.00)
LDL Cholesterol: 118 mg/dL — ABNORMAL HIGH (ref 0–99)
NonHDL: 138.32
Total CHOL/HDL Ratio: 3
Triglycerides: 104 mg/dL (ref 0.0–149.0)
VLDL: 20.8 mg/dL (ref 0.0–40.0)

## 2024-02-11 LAB — LIPASE: Lipase: 15 U/L (ref 11.0–59.0)

## 2024-02-11 LAB — HEMOGLOBIN A1C: Hgb A1c MFr Bld: 5.8 % (ref 4.6–6.5)

## 2024-02-11 LAB — TROPONIN I (HIGH SENSITIVITY): High Sens Troponin I: 3 ng/L (ref 2–17)

## 2024-02-11 LAB — LDL CHOLESTEROL, DIRECT: Direct LDL: 111 mg/dL

## 2024-02-11 NOTE — Assessment & Plan Note (Addendum)
 Stil ltaking 0.25 mg ozempic  for years to maintain  weight

## 2024-02-11 NOTE — Patient Instructions (Addendum)
 Try  using Benefiber on a daily basis with a stool softener to MAINTAIN NORMAL BOWEL MOVEMENTS  YOU CAN ALSO ADD A LOW DOSE OF MAGNESIUM  CITRATE (200 MG ) DAILY    REFERRAL TO [facility] AND FOR ABDOMINAL ULTRASOUND IN PROGRESS  .  If you are not contacted in TWO WEEKS,   check your mychart.  The referral coordinators will send you a message if they can't reach you by phone.

## 2024-02-11 NOTE — Progress Notes (Unsigned)
 Subjective:  Patient ID: Katherine Jimenez, female    DOB: 01/26/1951  Age: 73 y.o. MRN: [id number]  CC: The primary encounter diagnosis was Primary hypertension. Diagnoses of Hyperlipidemia associated with type 2 diabetes mellitus (HCC), DM type 2, controlled, with complication (HCC), Diabetes mellitus treated with injections of non-insulin  medication (HCC), Other chest pain, Acute epigastric pain, and Constipation, unspecified constipation type were also pertinent to this visit.   HPI Katherine Jimenez presents for  Chief Complaint  Patient presents with   Medical Management of Chronic Issues    3 month follow up on diabetes    1) Constipation:  chronic .  Aggravated by use of ozempic  DEVELOPED SEVERE  ABD PAIN    TOOK IMODIUM.   PAIN EVENTUALLY RESOLVED,  SMALL CALIBER STOOLS, Has tried stool softeners, suppositories BUT NOT BULK FORMING LAXATIVES ON A REGULAR BASIS,  COLONOSCOPY MOVED UP TO THIS WEEK  2) diabetes mellitus:  She  feels generally well,  But is not  exercising regularly or trying to lose weight. Checking  blood sugars less than once daily at variable times, usually only if she feels she may be having a hypoglycemic event. .  BS have been under 130 fasting and < 150 post prandially.  Denies any recent hypoglyemic events.  Taking   medications as directed. Following a carbohydrate modified diet 6 days per week. Denies numbness, burning and tingling of extremities. Appetite is good.    3(  had an episode of severe  heavy substernal chest pain which occurred while shopping on Friday.  No jaw pain or nausea , but became diaphoretic,.   [place],   had a 12 lead EKG by EMS<    Outpatient Medications Prior to Visit  Medication Sig Dispense Refill   acetaminophen  (TYLENOL ) 325 MG tablet Take 650 mg by mouth every 6 (six) hours as needed.     Biotin  5 MG CAPS Take 2 capsules by mouth 2 (two) times daily.      BLACK COHOSH EXTRACT PO Take 1 capsule by mouth daily.      blood glucose meter kit and supplies Dispense Contour next bayer meter. E11.9 To check blood glucose  Once a day. 1 each 0   busPIRone  (BUSPAR ) 30 MG tablet Take 1 tablet (30 mg total) by mouth in the morning and at bedtime. 180 tablet 1   Cholecalciferol  (VITAMIN D3) 1000 units CAPS Take 1,000 Units by mouth daily.      DULoxetine  (CYMBALTA ) 60 MG capsule Take 1 capsule (60 mg total) by mouth daily. 90 capsule 1   glucose blood (FREESTYLE LITE) test strip Use once daily  as instructed to test blood sugar E 11.9 100 each 3   lamoTRIgine  (LAMICTAL ) 200 MG tablet TAKE 1 TABLET BY MOUTH 2 TIMES A DAY 180 tablet 1   Lancets (FREESTYLE) lancets Use once daily to check blood sugars  E11.9 100 each 3   losartan  (COZAAR ) 50 MG tablet Take 1 tablet (50 mg total) by mouth daily. 90 tablet 1   lubiprostone (AMITIZA) 24 MCG capsule Take 24 mcg by mouth.     meloxicam  (MOBIC ) 15 MG tablet Take 15 mg by mouth daily.     omeprazole  (PRILOSEC) 40 MG capsule Take 1 capsule (40 mg total) by mouth daily. 90 capsule 3   Semaglutide ,0.25 or 0.5MG /DOS, (OZEMPIC , 0.25 OR 0.5 MG/DOSE,) 2 MG/1.5ML SOPN Inject 0.25 mg into the skin once a week.     trospium (SANCTURA) 20  MG tablet Take 1 tablet (20 mg total) by mouth 2 (two) times daily. 60 tablet 3   zolpidem  (AMBIEN ) 10 MG tablet TAKE 1 TABLET BY MOUTH EVERY NIGHT AT BEDTIME AS NEEDED FOR SLEEP 30 tablet 5   Na Sulfate-K Sulfate-Mg Sulfate concentrate (SUPREP) 17.5-3.13-1.6 GM/177ML SOLN Take 1 Bottle by mouth. (Patient not taking: Reported on 02/11/2024)     GLUCOSAMINE HCL PO Take 1 capsule by mouth daily.     No facility-administered medications prior to visit.    Review of Systems;  Patient denies headache, fevers, malaise, unintentional weight loss, skin rash, eye pain, sinus congestion and sinus pain, sore throat, dysphagia,  hemoptysis , cough, dyspnea, wheezing, palpitations, orthopnea, edema,  nausea, melena, diarrhea, constipation, flank pain, dysuria,  hematuria, urinary  Frequency, nocturia, numbness, tingling, seizures,  Focal weakness, Loss of consciousness,  Tremor, insomnia, depression, anxiety, and suicidal ideation.      Objective:  BP 120/74   Pulse 92   Ht 5' (1.524 m)   Wt 148 lb 3.2 oz (67.2 kg)   SpO2 94%   BMI 28.94 kg/m   BP Readings from Last 3 Encounters:  02/11/24 120/74  01/18/24 124/74  12/31/23 (!) 157/84    Wt Readings from Last 3 Encounters:  02/11/24 148 lb 3.2 oz (67.2 kg)  12/31/23 147 lb (66.7 kg)  12/13/23 147 lb 6.4 oz (66.9 kg)    Physical Exam Vitals reviewed.  Constitutional:      General: She is not in acute distress.    Appearance: Normal appearance. She is normal weight. She is not ill-appearing, toxic-appearing or diaphoretic.  HENT:     Head: Normocephalic.  Eyes:     General: No scleral icterus.       Right eye: No discharge.        Left eye: No discharge.     Conjunctiva/sclera: Conjunctivae normal.  Cardiovascular:     Rate and Rhythm: Normal rate and regular rhythm.     Heart sounds: Normal heart sounds.  Pulmonary:     Effort: Pulmonary effort is normal. No respiratory distress.     Breath sounds: Normal breath sounds.  Musculoskeletal:        General: Normal range of motion.  Skin:    General: Skin is warm and dry.  Neurological:     General: No focal deficit present.     Mental Status: She is alert and oriented to person, place, and time. Mental status is at baseline.  Psychiatric:        Mood and Affect: Mood normal.        Behavior: Behavior normal.        Thought Content: Thought content normal.        Judgment: Judgment normal.     Lab Results  Component Value Date   HGBA1C 5.8 02/11/2024   HGBA1C 6.6 (H) 11/07/2023   HGBA1C 5.9 (A) 06/11/2023    Lab Results  Component Value Date   CREATININE 0.75 02/11/2024   CREATININE 0.70 12/21/2023   CREATININE 0.78 11/07/2023    Lab Results  Component Value Date   WBC 5.2 12/21/2023   HGB 11.4 (L)  12/21/2023   HCT 36.1 12/21/2023   PLT 363.0 12/21/2023   GLUCOSE 108 (H) 02/11/2024   CHOL 229 (H) 02/11/2024   TRIG 104.0 02/11/2024   HDL 91.00 02/11/2024   LDLDIRECT 111.0 02/11/2024   LDLCALC 118 (H) 02/11/2024   ALT 17 02/11/2024   AST 16 02/11/2024   NA 135  02/11/2024   K 4.4 02/11/2024   CL 100 02/11/2024   CREATININE 0.75 02/11/2024   BUN 15 02/11/2024   CO2 25 02/11/2024   TSH 0.88 11/07/2023   INR 1.0 11/28/2023   HGBA1C 5.8 02/11/2024   MICROALBUR 2.3 (H) 11/07/2023    MM 3D SCREENING MAMMOGRAM BILATERAL BREAST Result Date: 10/15/2023 CLINICAL DATA:  Screening. EXAM: DIGITAL SCREENING BILATERAL MAMMOGRAM WITH TOMOSYNTHESIS AND CAD TECHNIQUE: Bilateral screening digital craniocaudal and mediolateral oblique mammograms were obtained. Bilateral screening digital breast tomosynthesis was performed. The images were evaluated with computer-aided detection. COMPARISON:  Previous exam(s). ACR Breast Density Category b: There are scattered areas of fibroglandular density. FINDINGS: There are no findings suspicious for malignancy. IMPRESSION: No mammographic evidence of malignancy. A result letter of this screening mammogram will be mailed directly to the patient. RECOMMENDATION: Screening mammogram in one year. (Code:SM-B-01Y) BI-RADS CATEGORY  1: Negative. Electronically Signed   By: Alm Parkins M.D.   On: 10/15/2023 08:33    Assessment & Plan:  .Primary hypertension -     Comprehensive metabolic panel with GFR  Hyperlipidemia associated with type 2 diabetes mellitus (HCC) -     Lipid panel -     LDL cholesterol, direct  DM type 2, controlled, with complication (HCC) -     Comprehensive metabolic panel with GFR -     Hemoglobin A1c  Diabetes mellitus treated with injections of non-insulin  medication (HCC) Assessment & Plan: Stil ltaking 0.25 mg ozempic  for years to maintain  weight    Other chest pain Assessment & Plan: Occurred several days ago ,  was evaluated  with EKG .  I have ordered and reviewed a 12 lead EKG and find that there are no acute changes and patient is in sinus rhythm.   Cardiac enzymes are normal. Referring to cardiology  Lab Results  Component Value Date   CKTOTAL 69 12/20/2021   CKMB <0.7 03/02/2021   TROPONINI <0.03 08/19/2018     Orders: -     Ambulatory referral to Cardiology -     Lipase -     US  ABDOMEN LIMITED RUQ (LIVER/GB); Future -     CK total and CKMB (cardiac)not at [facility] -     EKG 12-Lead -     Troponin I (High Sensitivity)  Acute epigastric pain -     Lipase -     US  ABDOMEN LIMITED RUQ (LIVER/GB); Future  Constipation, unspecified constipation type Assessment & Plan: Currently stooling 1/week.  Has not started amitize due to cost.  Taking ozempic . She has been referred for colonoscopy,      Follow-up: No follow-ups on file.   Verneita LITTIE Kettering, MD

## 2024-02-12 NOTE — Assessment & Plan Note (Signed)
 Currently stooling 1/week.  Has not started amitize due to cost.  Taking ozempic . She has been referred for colonoscopy,

## 2024-02-12 NOTE — Assessment & Plan Note (Signed)
 Occurred several days ago ,  was evaluated with EKG .  I have ordered and reviewed a 12 lead EKG and find that there are no acute changes and patient is in sinus rhythm.   Cardiac enzymes are normal. Referring to cardiology  Lab Results  Component Value Date   CKTOTAL 69 12/20/2021   CKMB <0.7 03/02/2021   TROPONINI <0.03 08/19/2018

## 2024-02-13 ENCOUNTER — Ambulatory Visit

## 2024-02-13 ENCOUNTER — Telehealth: Payer: Self-pay | Admitting: Internal Medicine

## 2024-02-13 ENCOUNTER — Ambulatory Visit: Payer: Self-pay | Admitting: Internal Medicine

## 2024-02-13 LAB — CK TOTAL AND CKMB (NOT AT ARMC)
CK, MB: <0.7 ng/mL (ref 0–5.0)
Total CK: 40 U/L (ref 18–225)

## 2024-02-13 NOTE — Telephone Encounter (Signed)
 Pt called to cancel colonoscopy will call back to reschedule after the holidays

## 2024-02-15 ENCOUNTER — Emergency Department

## 2024-02-15 ENCOUNTER — Telehealth: Payer: Self-pay

## 2024-02-15 ENCOUNTER — Emergency Department
Admission: EM | Admit: 2024-02-15 | Discharge: 2024-02-15 | Disposition: A | Discharge status: Home / Self Care | Attending: Emergency Medicine | Admitting: Emergency Medicine

## 2024-02-15 ENCOUNTER — Other Ambulatory Visit: Payer: Self-pay

## 2024-02-15 DIAGNOSIS — R0789 Other chest pain: Secondary | ICD-10-CM

## 2024-02-15 DIAGNOSIS — E119 Type 2 diabetes mellitus without complications: Secondary | ICD-10-CM | POA: Insufficient documentation

## 2024-02-15 DIAGNOSIS — I10 Essential (primary) hypertension: Secondary | ICD-10-CM | POA: Diagnosis not present

## 2024-02-15 DIAGNOSIS — R079 Chest pain, unspecified: Secondary | ICD-10-CM | POA: Diagnosis not present

## 2024-02-15 DIAGNOSIS — R0602 Shortness of breath: Secondary | ICD-10-CM | POA: Insufficient documentation

## 2024-02-15 DIAGNOSIS — R072 Precordial pain: Secondary | ICD-10-CM | POA: Diagnosis not present

## 2024-02-15 DIAGNOSIS — K449 Diaphragmatic hernia without obstruction or gangrene: Secondary | ICD-10-CM | POA: Diagnosis not present

## 2024-02-15 HISTORY — DX: Cerebral infarction, unspecified: I63.9

## 2024-02-15 LAB — CBC
HCT: 41.5 % (ref 36.0–46.0)
Hemoglobin: 13.3 g/dL (ref 12.0–15.0)
MCH: 29.9 pg (ref 26.0–34.0)
MCHC: 32 g/dL (ref 30.0–36.0)
MCV: 93.3 fL (ref 80.0–100.0)
Platelets: 383 K/uL (ref 150–400)
RBC: 4.45 MIL/uL (ref 3.87–5.11)
RDW: 14.9 % (ref 11.5–15.5)
WBC: 6.2 K/uL (ref 4.0–10.5)
nRBC: 0 % (ref 0.0–0.2)

## 2024-02-15 LAB — BASIC METABOLIC PANEL WITH GFR
Anion gap: 10 (ref 5–15)
BUN: 11 mg/dL (ref 8–23)
CO2: 25 mmol/L (ref 22–32)
Calcium: 9.6 mg/dL (ref 8.9–10.3)
Chloride: 100 mmol/L (ref 98–111)
Creatinine, Ser: 0.67 mg/dL (ref 0.44–1.00)
GFR, Estimated: >60 mL/min (ref >60)
Glucose, Bld: 143 mg/dL — ABNORMAL HIGH (ref 70–99)
Potassium: 4.6 mmol/L (ref 3.5–5.1)
Sodium: 136 mmol/L (ref 135–145)

## 2024-02-15 LAB — TROPONIN T, HIGH SENSITIVITY
Troponin T High Sensitivity: <15 ng/L (ref 0–19)
Troponin T High Sensitivity: <15 ng/L (ref 0–19)

## 2024-02-15 MED ORDER — IOHEXOL 350 MG/ML SOLN
75.0000 mL | Freq: Once | INTRAVENOUS | Status: AC | PRN
  Administered 2024-02-15: 75 mL via INTRAVENOUS

## 2024-02-15 NOTE — ED Triage Notes (Signed)
 Patient states chest pain that started shortly before 11am; denies shortness of breath.

## 2024-02-15 NOTE — Telephone Encounter (Signed)
 Called pt was unable to reach her to see if she needed us  to fax over EKG pt is currently still in the ED

## 2024-02-15 NOTE — Telephone Encounter (Signed)
 Copied from CRM #[id number]. Topic: Clinical - Medical Advice >> Feb 15, 2024 11:57 AM Maisie BROCKS wrote: Reason for CRM: pt called and asked for Dr. Marylynn to fax the EKG she had done on Monday because she is currently at [facility] ER. She asked for a nurse to give her a call back because it's urgent. Please call and advise,

## 2024-02-15 NOTE — ED Notes (Signed)
Pt ambulatory to room.

## 2024-02-15 NOTE — ED Provider Notes (Signed)
 [facility] Provider Note    Event Date/Time   First MD Initiated Contact with Patient 02/15/24 1508     (approximate)   History   Chest Pain   HPI  RAIMI GUILLERMO is a 73 y.o. female with a history of hypertension, diabetes, hyperlipidemia who presents with an episode of chest pain, acute onset around 11 AM, lasting about 15 to 20 minutes, now resolved.  The pain was pressure-like and substernal.  It did not radiate significantly.  She was not exerting herself.  She reports some associated shortness of breath.  She denies feeling dizzy or lightheaded.  The patient states that the pain is now almost completely resolved.  She just feels a mild discomfort.  She denies any leg pain or swelling.  She has no cough or fever.  I reviewed the past medical records.  The patient's most recent outpatient counter was on 11/10 with her primary care provider Dr. Tullo for follow-up of her chronic conditions.  She had reported an episode of chest pain several days before at that time.  Cardiac enzymes were negative.  She was to be referred to cardiology.   Physical Exam   Triage Vital Signs: ED Triage Vitals  Encounter Vitals Group     BP 02/15/24 1141 (!) 170/88     Girls Systolic BP Percentile --      Girls Diastolic BP Percentile --      Boys Systolic BP Percentile --      Boys Diastolic BP Percentile --      Pulse Rate 02/15/24 1141 (!) 108     Resp 02/15/24 1141 20     Temp 02/15/24 1141 99 F (37.2 C)     Temp Source 02/15/24 1141 Oral     SpO2 02/15/24 1141 96 %     Weight 02/15/24 1139 148 lb (67.1 kg)     Height 02/15/24 1139 5' (1.524 m)     Head Circumference --      Peak Flow --      Pain Score 02/15/24 1139 4     Pain Loc --      Pain Education --      Exclude from Growth Chart --     Most recent vital signs: Vitals:   02/15/24 1600 02/15/24 1735  BP: (!) 157/90 (!) 147/82  Pulse: 95   Resp: 14 16  Temp:    SpO2: 99% 98%      General: Awake, no distress.  CV:  Good peripheral perfusion.  Normal heart sounds. Resp:  Normal effort.  Lungs CTAB. Abd:  No distention.  Other:  No peripheral edema.  No calf or popliteal swelling or tenderness.   ED Results / Procedures / Treatments   Labs (all labs ordered are listed, but only abnormal results are displayed) Labs Reviewed  BASIC METABOLIC PANEL WITH GFR - Abnormal; Notable for the following components:      Result Value   Glucose, Bld 143 (*)    All other components within normal limits  CBC  TROPONIN T, HIGH SENSITIVITY  TROPONIN T, HIGH SENSITIVITY     EKG  ED ECG REPORT I, Waylon Cassis, the attending physician, personally viewed and interpreted this ECG.  Date: 02/15/2024 EKG Time: 1140 Rate: 106 Rhythm: Sinus tachycardia QRS Axis: normal Intervals: normal ST/T Wave abnormalities: normal Narrative Interpretation: no evidence of acute ischemia    RADIOLOGY  Chest x-ray: I independently viewed and interpreted the images; there is no  focal consolidation or edema  CTA chest:  IMPRESSION:  1. No evidence of pulmonary embolism or other acute intrathoracic  process.  2. Large hiatal hernia.  3. Cholelithiasis.  4. Aortic atherosclerosis.    PROCEDURES:  Critical Care performed: No  Procedures   MEDICATIONS ORDERED IN ED: Medications  iohexol  (OMNIPAQUE ) 350 MG/ML injection 75 mL (75 mLs Intravenous Contrast Given 02/15/24 1616)     IMPRESSION / MDM / ASSESSMENT AND PLAN / ED COURSE  I reviewed the triage vital signs and the nursing notes.  72 year old female with PMH as noted above presents with atypical, nonexertional chest pain that has now resolved.  On exam she is borderline tachycardic and has been so throughout her ED stay.  Other vital signs are normal.  Physical exam is unremarkable for acute findings.  Differential diagnosis includes, but is not limited to, ACS, musculoskeletal pain, GERD, neuropathic  pain, less likely PE.  I have a low suspicion for aortic dissection or vascular etiology given that the symptoms have almost completely resolved.  Troponins are negative x 2.  BMP and CBC show no acute findings.  EKG is nonischemic.  Chest x-ray is clear.  Given the persistent tachycardia we will obtain a CTA to rule out PE.  Patient's presentation is most consistent with acute presentation with potential threat to life or bodily function.  The patient is on the cardiac monitor to evaluate for evidence of arrhythmia and/or significant heart rate changes.  ----------------------------------------- 5:54 PM on 02/15/2024 -----------------------------------------  CTA is negative for PE.  The patient continues to remain chest pain-free and her tachycardia has resolved.  She is stable for discharge home at this time.  I counseled her on the results of the workup.  I gave strict return precautions, and she expressed understanding.   FINAL CLINICAL IMPRESSION(S) / ED DIAGNOSES   Final diagnoses:  Atypical chest pain     Rx / DC Orders   ED Discharge Orders     None        Note:  This document was prepared using Dragon voice recognition software and may include unintentional dictation errors.    Jacolyn Pae, MD 02/15/24 1754

## 2024-02-15 NOTE — Discharge Instructions (Signed)
 Follow-up with your primary care provider.  Return to the ER for new, worsening, or persistent severe chest pain, difficulty breathing, weakness or lightheadedness, or any other new or worsening symptoms that concern you.

## 2024-02-18 ENCOUNTER — Ambulatory Visit: Admission: RE | Admit: 2024-02-18 | Source: Home / Self Care | Admitting: Gastroenterology

## 2024-02-18 ENCOUNTER — Encounter: Admission: RE | Payer: Self-pay | Source: Home / Self Care

## 2024-02-18 SURGERY — COLONOSCOPY
Anesthesia: General

## 2024-02-18 NOTE — Telephone Encounter (Signed)
 Noted

## 2024-02-21 ENCOUNTER — Ambulatory Visit: Attending: Internal Medicine

## 2024-02-22 ENCOUNTER — Ambulatory Visit: Admitting: Cardiovascular Disease

## 2024-02-25 ENCOUNTER — Encounter: Payer: Self-pay | Admitting: Oncology

## 2024-02-25 ENCOUNTER — Telehealth: Payer: Self-pay

## 2024-02-25 NOTE — Telephone Encounter (Signed)
 Spoke with pt and stated that yesterday she started having painful urination and difficulty urinating. Pt stated that she started taking AZO and she has been able to urinate a little better now. She has now developed fever and body aches. Attempted to schedule pt for an appt this afternoon and she declined because she doesn't have anyone to bring her to the appt. Pt is scheduled for tomorrow at 9 am with Tabitha Dugal, NP at [facility].

## 2024-02-25 NOTE — Telephone Encounter (Signed)
 Copied from CRM #[id number]. Topic: Appointments - Scheduling Inquiry for Clinic >> Feb 25, 2024  8:25 AM Anairis L wrote: Reason for CRM: Unable to urinate, finally able to,Fever and body aches,no sleep. Went to hospital 02/15/2024 Chest pain/Not relate

## 2024-02-25 NOTE — Telephone Encounter (Signed)
 Spoke with pt and she is aware of Tabitha's message. Nothing further was needed.

## 2024-02-25 NOTE — Telephone Encounter (Signed)
 Will see pt as scheduled however can we please call to let her know to monitor symptoms very closely.   If she starts having back pain that is uncomfortable, she has difficulty urinating again, fever does not improve with medication and or she starts with vomiting then she needs to go to ER. Worry for kidney infection.   Otherwise will happily see her tomorrow.

## 2024-02-26 ENCOUNTER — Encounter: Payer: Self-pay | Admitting: Family

## 2024-02-26 ENCOUNTER — Ambulatory Visit (INDEPENDENT_AMBULATORY_CARE_PROVIDER_SITE_OTHER): Admitting: Family

## 2024-02-26 VITALS — BP 102/60 | HR 105 | Temp 98.2°F | Ht 60.0 in | Wt 151.2 lb

## 2024-02-26 DIAGNOSIS — N3001 Acute cystitis with hematuria: Secondary | ICD-10-CM

## 2024-02-26 DIAGNOSIS — R82998 Other abnormal findings in urine: Secondary | ICD-10-CM | POA: Diagnosis not present

## 2024-02-26 DIAGNOSIS — R309 Painful micturition, unspecified: Secondary | ICD-10-CM | POA: Diagnosis not present

## 2024-02-26 LAB — POC URINALSYSI DIPSTICK (AUTOMATED)
Bilirubin, UA: NEGATIVE
Glucose, UA: NEGATIVE
Ketones, UA: POSITIVE — AB
Nitrite, UA: POSITIVE — AB
Protein, UA: POSITIVE — AB
Spec Grav, UA: 1.015 (ref 1.010–1.025)
Urobilinogen, UA: 0.2 U/dL
pH, UA: 6 (ref 5.0–8.0)

## 2024-02-26 MED ORDER — CIPROFLOXACIN HCL 500 MG PO TABS: 500.0000 mg | ORAL_TABLET | Freq: Two times a day (BID) | ORAL | 0 refills | Status: AC

## 2024-02-26 NOTE — Progress Notes (Signed)
 Established Patient Office Visit  Subjective:      CC:  Chief Complaint  Patient presents with   Acute Visit    Possible UTI, reports painful urination, fever and body aches x3 days.    HPI: Katherine Jimenez is a 73 y.o. female presenting on 02/26/2024 for Acute Visit (Possible UTI, reports painful urination, fever and body aches x3 days.) .  Discussed the use of AI scribe software for clinical note transcription with the patient, who gave verbal consent to proceed.  History of Present Illness Katherine Jimenez is a 73 year old female with recurrent urinary tract infections who presents with acute urinary symptoms.  Her symptoms began three days ago with an inability to urinate, accompanied by significant pain described as 'hurting up inside.' She attempted self-treatment with over-the-counter medications Azo and Cystex, which eventually allowed her to urinate after several hours.  She denies visible hematuria but reports experiencing back pain, fever, chills, and leg aches, primarily on Monday. She was concerned about the possibility of having the flu due to these symptoms.  Her past medical history includes two prior urinary tract infections and a cystoscope exam in late October, which did not reveal any tumors or abnormalities. She is currently under the care of a urologist for recurrent UTIs.  During the review of symptoms, she reports burning and urgency with urination, but not frequency.           Social history:  Relevant past medical, surgical, family and social history reviewed and updated as indicated. Interim medical history since our last visit reviewed.  Allergies and medications reviewed and updated.  DATA REVIEWED: CHART IN EPIC     ROS: Negative unless specifically indicated above in HPI.    Current Outpatient Medications:    acetaminophen  (TYLENOL ) 325 MG tablet, Take 650 mg by mouth every 6 (six) hours as needed., Disp: , Rfl:    Biotin  5 MG  CAPS, Take 2 capsules by mouth 2 (two) times daily. , Disp: , Rfl:    BLACK COHOSH EXTRACT PO, Take 1 capsule by mouth daily., Disp: , Rfl:    blood glucose meter kit and supplies, Dispense Contour next bayer meter. E11.9 To check blood glucose  Once a day., Disp: 1 each, Rfl: 0   busPIRone  (BUSPAR ) 30 MG tablet, Take 1 tablet (30 mg total) by mouth in the morning and at bedtime., Disp: 180 tablet, Rfl: 1   Cholecalciferol  (VITAMIN D3) 1000 units CAPS, Take 1,000 Units by mouth daily. , Disp: , Rfl:    DULoxetine  (CYMBALTA ) 60 MG capsule, Take 1 capsule (60 mg total) by mouth daily., Disp: 90 capsule, Rfl: 1   glucose blood (FREESTYLE LITE) test strip, Use once daily  as instructed to test blood sugar E 11.9, Disp: 100 each, Rfl: 3   lamoTRIgine  (LAMICTAL ) 200 MG tablet, TAKE 1 TABLET BY MOUTH 2 TIMES A DAY, Disp: 180 tablet, Rfl: 1   Lancets (FREESTYLE) lancets, Use once daily to check blood sugars  E11.9, Disp: 100 each, Rfl: 3   losartan  (COZAAR ) 50 MG tablet, Take 1 tablet (50 mg total) by mouth daily., Disp: 90 tablet, Rfl: 1   lubiprostone (AMITIZA) 24 MCG capsule, Take 24 mcg by mouth., Disp: , Rfl:    meloxicam  (MOBIC ) 15 MG tablet, Take 15 mg by mouth daily., Disp: , Rfl:    omeprazole  (PRILOSEC) 40 MG capsule, Take 1 capsule (40 mg total) by mouth daily., Disp: 90 capsule, Rfl: 3   Semaglutide ,0.25  or 0.5MG /DOS, (OZEMPIC , 0.25 OR 0.5 MG/DOSE,) 2 MG/1.5ML SOPN, Inject 0.25 mg into the skin once a week., Disp: , Rfl:    trospium  (SANCTURA ) 20 MG tablet, Take 1 tablet (20 mg total) by mouth 2 (two) times daily., Disp: 60 tablet, Rfl: 3   zolpidem  (AMBIEN ) 10 MG tablet, TAKE 1 TABLET BY MOUTH EVERY NIGHT AT BEDTIME AS NEEDED FOR SLEEP, Disp: 30 tablet, Rfl: 5   Na Sulfate-K Sulfate-Mg Sulfate concentrate (SUPREP) 17.5-3.13-1.6 GM/177ML SOLN, Take 1 Bottle by mouth. (Patient not taking: Reported on 02/26/2024), Disp: , Rfl:         Objective:        BP 102/60 (BP Location: Left Arm,  Patient Position: Sitting, Cuff Size: Normal)   Pulse (!) 105   Temp 98.2 F (36.8 C) (Temporal)   Ht 5' (1.524 m)   Wt 151 lb 3.2 oz (68.6 kg)   SpO2 98%   BMI 29.53 kg/m   Physical Exam ABDOMEN: Mild tenderness on abdominal and kidney palpation.  Wt Readings from Last 3 Encounters:  02/26/24 151 lb 3.2 oz (68.6 kg)  02/15/24 148 lb (67.1 kg)  02/11/24 148 lb 3.2 oz (67.2 kg)    Physical Exam Constitutional:      General: She is not in acute distress.    Appearance: Normal appearance. She is normal weight. She is not ill-appearing, toxic-appearing or diaphoretic.  Cardiovascular:     Rate and Rhythm: Normal rate.  Pulmonary:     Effort: Pulmonary effort is normal.  Abdominal:     General: Abdomen is flat.     Tenderness: There is abdominal tenderness in the suprapubic area. There is right CVA tenderness and left CVA tenderness.  Neurological:     General: No focal deficit present.     Mental Status: She is alert and oriented to person, place, and time. Mental status is at baseline.     Motor: No weakness.  Psychiatric:        Mood and Affect: Mood normal.        Behavior: Behavior normal.        Thought Content: Thought content normal.        Judgment: Judgment normal.          Results LABS Urinalysis: Hematuria: large, Proteinuria: large, Bacteriuria: large, Nitrites: present (02/25/2024)  DIAGNOSTIC Cystoscopy: No tumors or abnormalities observed (02/01/2024)  Assessment & Plan:   Assessment and Plan Assessment & Plan Acute urinary tract infection with hematuria and dysuria Acute urinary tract infection with hematuria and dysuria, onset three days ago. Symptoms include dysuria, urgency, and fever. Urinalysis shows significant hematuria, proteinuria, and bacteriuria with nitrates, indicating a likely UTI. Concern for progression to pyelonephritis due to fever and chills. Previous cystoscopy in October showed no abnormalities. Current symptoms suggest a  complicated UTI, warranting antibiotic treatment. Ciprofloxacin  chosen due to its efficacy in complicated UTIs and her symptoms of fever and chills. - with fever chills and poct urine results, concern for progression or possible pyelenophritis will treat with ciprofloxacin .  -Prescribed ciprofloxacin  500 mg, one pill twice a day for seven days. - Sent urine for urinalysis and culture to confirm antibiotic choice. - Advised increased fluid intake. - Instructed to seek medical attention if symptoms worsen, such as increased fever, pain, or inability to urinate.        No follow-ups on file.     Ginger Patrick, MSN, APRN, FNP-C [place] [facility]

## 2024-02-28 LAB — URINE CULTURE
MICRO NUMBER:: 17281447
SPECIMEN QUALITY:: ADEQUATE

## 2024-03-03 ENCOUNTER — Inpatient Hospital Stay: Attending: Oncology

## 2024-03-03 ENCOUNTER — Ambulatory Visit: Payer: Self-pay | Admitting: Family

## 2024-03-03 DIAGNOSIS — D509 Iron deficiency anemia, unspecified: Secondary | ICD-10-CM | POA: Insufficient documentation

## 2024-03-03 LAB — CBC WITH DIFFERENTIAL/PLATELET
Abs Immature Granulocytes: 0.3 K/uL — ABNORMAL HIGH (ref 0.00–0.07)
Basophils Absolute: 0.1 K/uL (ref 0.0–0.1)
Basophils Relative: 2 %
Eosinophils Absolute: 0.2 K/uL (ref 0.0–0.5)
Eosinophils Relative: 4 %
HCT: 40 % (ref 36.0–46.0)
Hemoglobin: 13 g/dL (ref 12.0–15.0)
Immature Granulocytes: 5 %
Lymphocytes Relative: 37 %
Lymphs Abs: 2.3 K/uL (ref 0.7–4.0)
MCH: 29.7 pg (ref 26.0–34.0)
MCHC: 32.5 g/dL (ref 30.0–36.0)
MCV: 91.5 fL (ref 80.0–100.0)
Monocytes Absolute: 0.5 K/uL (ref 0.1–1.0)
Monocytes Relative: 7 %
Neutro Abs: 2.8 K/uL (ref 1.7–7.7)
Neutrophils Relative %: 45 %
Platelets: 415 K/uL — ABNORMAL HIGH (ref 150–400)
RBC: 4.37 MIL/uL (ref 3.87–5.11)
RDW: 14.1 % (ref 11.5–15.5)
WBC: 6.2 K/uL (ref 4.0–10.5)
nRBC: 0 % (ref 0.0–0.2)

## 2024-03-03 LAB — RETIC PANEL
Immature Retic Fract: 17.2 % — ABNORMAL HIGH (ref 2.3–15.9)
RBC.: 4.37 MIL/uL (ref 3.87–5.11)
Retic Count, Absolute: 51.6 K/uL (ref 19.0–186.0)
Retic Ct Pct: 1.2 % (ref 0.4–3.1)
Reticulocyte Hemoglobin: 32.2 pg (ref >27.9)

## 2024-03-03 LAB — IRON AND TIBC
Iron: 99 ug/dL (ref 28–170)
Saturation Ratios: 26 % (ref 10.4–31.8)
TIBC: 378 ug/dL (ref 250–450)
UIBC: 279 ug/dL

## 2024-03-03 LAB — FERRITIN: Ferritin: 90 ng/mL (ref 11–307)

## 2024-03-04 ENCOUNTER — Ambulatory Visit: Admitting: Cardiovascular Disease

## 2024-03-10 ENCOUNTER — Ambulatory Visit

## 2024-03-10 ENCOUNTER — Telehealth: Payer: Self-pay | Admitting: Oncology

## 2024-03-10 ENCOUNTER — Ambulatory Visit: Admitting: Oncology

## 2024-03-10 NOTE — Telephone Encounter (Signed)
 Due to the weather pt wants to cancel MD/iron  appts. Pt stated she will call back to r/s when the weather is better. Appts canceled and noted.

## 2024-03-12 DIAGNOSIS — M419 Scoliosis, unspecified: Secondary | ICD-10-CM | POA: Diagnosis not present

## 2024-03-12 DIAGNOSIS — M546 Pain in thoracic spine: Secondary | ICD-10-CM | POA: Diagnosis not present

## 2024-03-12 DIAGNOSIS — G8929 Other chronic pain: Secondary | ICD-10-CM | POA: Diagnosis not present

## 2024-03-25 ENCOUNTER — Telehealth: Payer: Self-pay | Admitting: Urology

## 2024-03-25 NOTE — Telephone Encounter (Signed)
 Patient called and stated that she is interested in Botox injections and would like to know if Dr. Twylla would refer her to Dr. MacDiarmid for this. She said she will be changing insurance to [place] in January. Please advise patient.

## 2024-04-15 ENCOUNTER — Encounter: Payer: Self-pay | Admitting: Oncology

## 2024-04-18 ENCOUNTER — Ambulatory Visit
Admission: RE | Admit: 2024-04-18 | Discharge: 2024-04-18 | Disposition: A | Discharge status: Home / Self Care | Source: Ambulatory Visit | Attending: Internal Medicine | Admitting: Internal Medicine

## 2024-04-18 DIAGNOSIS — R0789 Other chest pain: Secondary | ICD-10-CM | POA: Insufficient documentation

## 2024-04-18 DIAGNOSIS — R1013 Epigastric pain: Secondary | ICD-10-CM | POA: Insufficient documentation

## 2024-04-19 ENCOUNTER — Telehealth: Payer: Self-pay | Admitting: Family Medicine

## 2024-04-19 NOTE — Telephone Encounter (Signed)
 3:16 PM Answering service call.  Per access nurse, patient has been experiencing GI issues, with some delay on workup until recently. Ultrasound yesterday - asking about results. Having vomiting that started today. Advised to be seen in ER, she declined recommendations and wanted call to on call physician to review report.  US  report noted, discussed with patient.  In the time that she was on hold she did receive the MyChart notification of the ultrasound results. IMPRESSION: 1. Cholelithiasis without sonographic evidence of acute cholecystitis. 2. Large hiatal hernia, better seen on prior chest CT.  Patient has noted low grade fever with vomiting x4 today. Temp around 99. Bloating sensation at times. Pain into R scapula past few days.  Discussed that although no apparent acute cholecystitis on ultrasound from yesterday, with the development of low-grade fever, vomiting today and her other symptoms, possible acute cholecystitis and did recommend emergency room evaluation, consistent with recommendation from access nurse.  Discussed with patient, understanding of recommendations expressed and all questions answered..  Will forward note to primary care provider for follow-up.

## 2024-04-20 ENCOUNTER — Emergency Department

## 2024-04-20 ENCOUNTER — Other Ambulatory Visit: Payer: Self-pay

## 2024-04-20 ENCOUNTER — Observation Stay
Admission: EM | Admit: 2024-04-20 | Discharge: 2024-04-22 | Disposition: A | Discharge status: Home / Self Care | Attending: Hospitalist | Admitting: Hospitalist

## 2024-04-20 DIAGNOSIS — E118 Type 2 diabetes mellitus with unspecified complications: Secondary | ICD-10-CM | POA: Diagnosis present

## 2024-04-20 DIAGNOSIS — E119 Type 2 diabetes mellitus without complications: Secondary | ICD-10-CM | POA: Insufficient documentation

## 2024-04-20 DIAGNOSIS — K219 Gastro-esophageal reflux disease without esophagitis: Secondary | ICD-10-CM | POA: Insufficient documentation

## 2024-04-20 DIAGNOSIS — F319 Bipolar disorder, unspecified: Secondary | ICD-10-CM | POA: Diagnosis not present

## 2024-04-20 DIAGNOSIS — Z96643 Presence of artificial hip joint, bilateral: Secondary | ICD-10-CM | POA: Insufficient documentation

## 2024-04-20 DIAGNOSIS — R748 Abnormal levels of other serum enzymes: Secondary | ICD-10-CM | POA: Diagnosis not present

## 2024-04-20 DIAGNOSIS — F01A4 Vascular dementia, mild, with anxiety: Secondary | ICD-10-CM | POA: Diagnosis not present

## 2024-04-20 DIAGNOSIS — R1011 Right upper quadrant pain: Secondary | ICD-10-CM | POA: Diagnosis present

## 2024-04-20 DIAGNOSIS — K8020 Calculus of gallbladder without cholecystitis without obstruction: Secondary | ICD-10-CM | POA: Diagnosis not present

## 2024-04-20 DIAGNOSIS — E785 Hyperlipidemia, unspecified: Secondary | ICD-10-CM | POA: Insufficient documentation

## 2024-04-20 DIAGNOSIS — Z8673 Personal history of transient ischemic attack (TIA), and cerebral infarction without residual deficits: Secondary | ICD-10-CM | POA: Diagnosis not present

## 2024-04-20 DIAGNOSIS — I10 Essential (primary) hypertension: Secondary | ICD-10-CM | POA: Diagnosis not present

## 2024-04-20 DIAGNOSIS — E1169 Type 2 diabetes mellitus with other specified complication: Secondary | ICD-10-CM | POA: Diagnosis present

## 2024-04-20 DIAGNOSIS — K8012 Calculus of gallbladder with acute and chronic cholecystitis without obstruction: Secondary | ICD-10-CM | POA: Diagnosis not present

## 2024-04-20 DIAGNOSIS — R079 Chest pain, unspecified: Secondary | ICD-10-CM

## 2024-04-20 DIAGNOSIS — E663 Overweight: Secondary | ICD-10-CM | POA: Insufficient documentation

## 2024-04-20 DIAGNOSIS — Z6831 Body mass index (BMI) 31.0-31.9, adult: Secondary | ICD-10-CM | POA: Insufficient documentation

## 2024-04-20 DIAGNOSIS — K810 Acute cholecystitis: Secondary | ICD-10-CM

## 2024-04-20 DIAGNOSIS — Z79899 Other long term (current) drug therapy: Secondary | ICD-10-CM | POA: Insufficient documentation

## 2024-04-20 DIAGNOSIS — Z96651 Presence of right artificial knee joint: Secondary | ICD-10-CM | POA: Diagnosis not present

## 2024-04-20 LAB — COMPREHENSIVE METABOLIC PANEL WITH GFR
ALT: 11 U/L (ref 0–44)
AST: 16 U/L (ref 15–41)
Albumin: 4.5 g/dL (ref 3.5–5.0)
Alkaline Phosphatase: 134 U/L — ABNORMAL HIGH (ref 38–126)
Anion gap: 13 (ref 5–15)
BUN: 11 mg/dL (ref 8–23)
CO2: 25 mmol/L (ref 22–32)
Calcium: 9.7 mg/dL (ref 8.9–10.3)
Chloride: 98 mmol/L (ref 98–111)
Creatinine, Ser: 0.83 mg/dL (ref 0.44–1.00)
GFR, Estimated: >60 mL/min
Glucose, Bld: 154 mg/dL — ABNORMAL HIGH (ref 70–99)
Potassium: 4 mmol/L (ref 3.5–5.1)
Sodium: 135 mmol/L (ref 135–145)
Total Bilirubin: 0.3 mg/dL (ref 0.0–1.2)
Total Protein: 7.4 g/dL (ref 6.5–8.1)

## 2024-04-20 LAB — CBC WITH DIFFERENTIAL/PLATELET
Abs Immature Granulocytes: 0.08 K/uL — ABNORMAL HIGH (ref 0.00–0.07)
Basophils Absolute: 0.1 K/uL (ref 0.0–0.1)
Basophils Relative: 1 %
Eosinophils Absolute: 0.3 K/uL (ref 0.0–0.5)
Eosinophils Relative: 3 %
HCT: 43.4 % (ref 36.0–46.0)
Hemoglobin: 13.9 g/dL (ref 12.0–15.0)
Immature Granulocytes: 1 %
Lymphocytes Relative: 41 %
Lymphs Abs: 3.1 K/uL (ref 0.7–4.0)
MCH: 30.2 pg (ref 26.0–34.0)
MCHC: 32 g/dL (ref 30.0–36.0)
MCV: 94.1 fL (ref 80.0–100.0)
Monocytes Absolute: 0.8 K/uL (ref 0.1–1.0)
Monocytes Relative: 10 %
Neutro Abs: 3.2 K/uL (ref 1.7–7.7)
Neutrophils Relative %: 44 %
Platelets: 440 K/uL — ABNORMAL HIGH (ref 150–400)
RBC: 4.61 MIL/uL (ref 3.87–5.11)
RDW: 13.6 % (ref 11.5–15.5)
WBC: 7.5 K/uL (ref 4.0–10.5)
nRBC: 0 % (ref 0.0–0.2)

## 2024-04-20 LAB — MAGNESIUM: Magnesium: 2.1 mg/dL (ref 1.7–2.4)

## 2024-04-20 LAB — TROPONIN T, HIGH SENSITIVITY
Troponin T High Sensitivity: <15 ng/L (ref 0–19)
Troponin T High Sensitivity: <15 ng/L (ref 0–19)

## 2024-04-20 LAB — GLUCOSE, CAPILLARY: Glucose-Capillary: 142 mg/dL — ABNORMAL HIGH (ref 70–99)

## 2024-04-20 LAB — LIPASE, BLOOD: Lipase: 23 U/L (ref 11–51)

## 2024-04-20 MED ORDER — DIPHENHYDRAMINE HCL 25 MG PO CAPS
25.0000 mg | ORAL_CAPSULE | Freq: Once | ORAL | Status: AC
  Administered 2024-04-20: 25 mg via ORAL
  Filled 2024-04-20: qty 1

## 2024-04-20 MED ORDER — SENNOSIDES-DOCUSATE SODIUM 8.6-50 MG PO TABS: 1.0000 | ORAL_TABLET | Freq: Every evening | ORAL | Status: DC | PRN

## 2024-04-20 MED ORDER — CEFAZOLIN SODIUM-DEXTROSE 2-4 GM/100ML-% IV SOLN
2.0000 g | Freq: Three times a day (TID) | INTRAVENOUS | Status: DC
  Administered 2024-04-21 – 2024-04-22 (×3): 2 g via INTRAVENOUS
  Filled 2024-04-20 (×2): qty 100

## 2024-04-20 MED ORDER — HEPARIN SODIUM (PORCINE) 5000 UNIT/ML IJ SOLN
5000.0000 [IU] | Freq: Three times a day (TID) | INTRAMUSCULAR | Status: AC
  Administered 2024-04-20: 5000 [IU] via SUBCUTANEOUS
  Filled 2024-04-20: qty 1

## 2024-04-20 MED ORDER — FESOTERODINE FUMARATE ER 4 MG PO TB24
4.0000 mg | ORAL_TABLET | Freq: Every day | ORAL | Status: DC
  Filled 2024-04-20 (×2): qty 1

## 2024-04-20 MED ORDER — POLYETHYLENE GLYCOL 3350 17 G PO PACK
17.0000 g | PACK | Freq: Every day | ORAL | Status: DC
  Filled 2024-04-20 (×2): qty 1

## 2024-04-20 MED ORDER — LAMOTRIGINE 25 MG PO TABS
200.0000 mg | ORAL_TABLET | Freq: Two times a day (BID) | ORAL | Status: DC
  Administered 2024-04-20 – 2024-04-21 (×2): 200 mg via ORAL
  Filled 2024-04-20 (×3): qty 8

## 2024-04-20 MED ORDER — ONDANSETRON HCL 4 MG PO TABS: 4.0000 mg | ORAL_TABLET | Freq: Four times a day (QID) | ORAL | Status: DC | PRN

## 2024-04-20 MED ORDER — OXYCODONE HCL 5 MG PO TABS
5.0000 mg | ORAL_TABLET | ORAL | Status: AC | PRN
  Administered 2024-04-20: 5 mg via ORAL
  Filled 2024-04-20: qty 1

## 2024-04-20 MED ORDER — ONDANSETRON HCL 4 MG/2ML IJ SOLN: 4.0000 mg | Freq: Four times a day (QID) | INTRAMUSCULAR | Status: DC | PRN

## 2024-04-20 MED ORDER — DULOXETINE HCL 30 MG PO CPEP
60.0000 mg | ORAL_CAPSULE | Freq: Every day | ORAL | Status: DC
  Administered 2024-04-21: 60 mg via ORAL
  Filled 2024-04-20 (×2): qty 2

## 2024-04-20 MED ORDER — ZOLPIDEM TARTRATE 5 MG PO TABS
5.0000 mg | ORAL_TABLET | Freq: Every day | ORAL | Status: DC
  Administered 2024-04-20 – 2024-04-21 (×2): 5 mg via ORAL
  Filled 2024-04-20 (×2): qty 1

## 2024-04-20 MED ORDER — PANTOPRAZOLE SODIUM 40 MG PO TBEC
40.0000 mg | DELAYED_RELEASE_TABLET | Freq: Every day | ORAL | Status: DC
  Administered 2024-04-22: 40 mg via ORAL
  Filled 2024-04-20: qty 1

## 2024-04-20 MED ORDER — MORPHINE SULFATE (PF) 4 MG/ML IV SOLN
4.0000 mg | Freq: Once | INTRAVENOUS | Status: AC
  Administered 2024-04-20: 4 mg via INTRAVENOUS
  Filled 2024-04-20: qty 1

## 2024-04-20 MED ORDER — IOHEXOL 300 MG/ML  SOLN
100.0000 mL | Freq: Once | INTRAMUSCULAR | Status: AC | PRN
  Administered 2024-04-20: 100 mL via INTRAVENOUS

## 2024-04-20 MED ORDER — LACTATED RINGERS IV BOLUS
1000.0000 mL | Freq: Once | INTRAVENOUS | Status: AC
  Administered 2024-04-20: 1000 mL via INTRAVENOUS

## 2024-04-20 MED ORDER — SODIUM CHLORIDE 0.9% FLUSH
3.0000 mL | Freq: Two times a day (BID) | INTRAVENOUS | Status: DC
  Administered 2024-04-20 – 2024-04-21 (×3): 3 mL via INTRAVENOUS

## 2024-04-20 MED ORDER — LACTATED RINGERS IV SOLN: INTRAVENOUS | Status: AC

## 2024-04-20 MED ORDER — ACETAMINOPHEN 650 MG RE SUPP: 650.0000 mg | Freq: Four times a day (QID) | RECTAL | Status: DC | PRN

## 2024-04-20 MED ORDER — INSULIN ASPART 100 UNIT/ML IJ SOLN: 0.0000 [IU] | Freq: Every day | INTRAMUSCULAR | Status: DC

## 2024-04-20 MED ORDER — VITAMIN D3 25 MCG (1000 UNIT) PO TABS
1000.0000 [IU] | ORAL_TABLET | Freq: Every day | ORAL | Status: DC
  Filled 2024-04-20 (×3): qty 1

## 2024-04-20 MED ORDER — ACETAMINOPHEN 325 MG PO TABS
650.0000 mg | ORAL_TABLET | Freq: Four times a day (QID) | ORAL | Status: DC | PRN
  Administered 2024-04-20: 650 mg via ORAL
  Filled 2024-04-20: qty 2

## 2024-04-20 MED ORDER — INSULIN ASPART 100 UNIT/ML IJ SOLN
0.0000 [IU] | Freq: Three times a day (TID) | INTRAMUSCULAR | Status: DC
  Administered 2024-04-21: 2 [IU] via SUBCUTANEOUS

## 2024-04-20 MED ORDER — CHLORHEXIDINE GLUCONATE CLOTH 2 % EX PADS
6.0000 | MEDICATED_PAD | Freq: Every day | CUTANEOUS | Status: DC
  Administered 2024-04-21: 6 via TOPICAL

## 2024-04-20 NOTE — Consult Note (Signed)
 Patient ID: Katherine Jimenez, female   DOB: [birth year], 74 y.o.   MRN: [id number]  HPI Katherine Jimenez is a 74 y.o. female seen in consultation at the request of Dr.Mumma. She presents of some abdominal discomfort nausea and diarrhea that has been present for the last few weeks.  Reports that she also have some retrosternal pain and that the pain radiates to the back between the shoulder blades. Summary of symptoms seem to be worsening with certain meals although it is difficult to establish a pattern. She denies any fevers any chills no evidence of biliary obstruction.  She also presents and admits some vomiting.  She has been seen by primary care physicians as well as ED visits multiple times. Far workup labs including ultrasound as well as CT that I have personally reviewed showing evidence of cholelithiasis without cholecystitis normal common bile duct and also a moderate hiatal hernia.  She did have a colonoscopy more than 15 years ago which was normal.  She is also scheduled to see GI in the next few weeks for repeat endoscopic evaluation She is diabetic and is taking GLP-1 She is able to perform more than 4 METS of activity without shortness of breath or chest pain  HPI  Past Medical History:  Diagnosis Date   Arthritis    back, knees, fingers   Bipolar disorder (HCC)    Bunion, left    CVA (cerebral vascular accident) (HCC)    Depression    Diabetes mellitus    diet controlled.    Elevated liver enzymes 05/31/2021   Hypertension    Lab test positive for detection of COVID-19 virus 03/02/2021   Lower back pain    S/P fall   Squamous cell skin cancer, face    [facility]   Treadmill stress test negative for angina pectoris 09/2011    Past Surgical History:  Procedure Laterality Date   BLADDER AUGMENTATION     BLADDER SURGERY     CARDIAC CATHETERIZATION  03/05/12   no stents   CATARACT EXTRACTION W/PHACO Right 07/19/2015   Procedure: CATARACT EXTRACTION PHACO AND INTRAOCULAR  LENS PLACEMENT (IOC) right eye;  Surgeon: Donzell Arlyce Budd, MD;  Location: [facility];  Service: Ophthalmology;  Laterality: Right;  BORDERLINE DIABETIC - oral meds   CATARACT EXTRACTION W/PHACO Left 05/28/2017   Procedure: CATARACT EXTRACTION PHACO AND INTRAOCULAR LENS PLACEMENT (IOC) LEFT DIABETIC;  Surgeon: Mittie Gaskin, MD;  Location: [facility];  Service: Ophthalmology;  Laterality: Left;  Diabetic - oal meds   JOINT REPLACEMENT  2012   bilateral hip   KNEE ARTHROSCOPY     TOTAL HIP ARTHROPLASTY  2012   TOTAL KNEE ARTHROPLASTY Right 06/04/2017   Procedure: TOTAL KNEE ARTHROPLASTY;  Surgeon: Leora Lynwood SAUNDERS, MD;  Location: [facility];  Service: Orthopedics;  Laterality: Right;   TUBAL LIGATION     VAGINAL DELIVERY     2    Family History  Problem Relation Age of Onset   Diabetes Mother    Heart disease Mother    Stroke Mother    Diabetes Father    Heart disease Father    Dementia Father    Diabetes Sister    Diabetes Maternal Aunt    Breast cancer Maternal Aunt    Diabetes Maternal Uncle    Heart disease Maternal Grandmother    Diabetes Maternal Grandfather    Heart disease Maternal Grandfather    Diabetes Paternal Grandmother    Heart disease Paternal Grandmother  Heart disease Paternal Grandfather     Social History Social History[1]  Allergies[2]  No current facility-administered medications for this encounter.   Current Outpatient Medications  Medication Sig Dispense Refill   acetaminophen  (TYLENOL ) 325 MG tablet Take 650 mg by mouth every 6 (six) hours as needed.     aspirin  EC 81 MG tablet Take 81 mg by mouth daily. Swallow whole.     Biotin  5 MG CAPS Take 2 capsules by mouth 2 (two) times daily.      BLACK COHOSH EXTRACT PO Take 1 capsule by mouth daily.     blood glucose meter kit and supplies Dispense Contour next bayer meter. E11.9 To check blood glucose  Once a day. 1 each 0   busPIRone  (BUSPAR ) 30 MG tablet Take 1 tablet  (30 mg total) by mouth in the morning and at bedtime. 180 tablet 1   Cholecalciferol  (VITAMIN D3) 1000 units CAPS Take 1,000 Units by mouth daily.      DULoxetine  (CYMBALTA ) 60 MG capsule Take 1 capsule (60 mg total) by mouth daily. 90 capsule 1   glucose blood (FREESTYLE LITE) test strip Use once daily  as instructed to test blood sugar E 11.9 100 each 3   lamoTRIgine  (LAMICTAL ) 200 MG tablet TAKE 1 TABLET BY MOUTH 2 TIMES A DAY 180 tablet 1   Lancets (FREESTYLE) lancets Use once daily to check blood sugars  E11.9 100 each 3   losartan  (COZAAR ) 50 MG tablet Take 1 tablet (50 mg total) by mouth daily. 90 tablet 1   meloxicam  (MOBIC ) 15 MG tablet Take 15 mg by mouth daily.     Na Sulfate-K Sulfate-Mg Sulfate concentrate (SUPREP) 17.5-3.13-1.6 GM/177ML SOLN Take 1 Bottle by mouth. (Patient not taking: Reported on 02/26/2024)     omeprazole  (PRILOSEC) 40 MG capsule Take 1 capsule (40 mg total) by mouth daily. 90 capsule 3   Semaglutide ,0.25 or 0.5MG /DOS, (OZEMPIC , 0.25 OR 0.5 MG/DOSE,) 2 MG/1.5ML SOPN Inject 0.25 mg into the skin once a week.     trospium  (SANCTURA ) 20 MG tablet Take 1 tablet (20 mg total) by mouth 2 (two) times daily. 60 tablet 3   zolpidem  (AMBIEN ) 10 MG tablet TAKE 1 TABLET BY MOUTH EVERY NIGHT AT BEDTIME AS NEEDED FOR SLEEP 30 tablet 5     Review of Systems Full ROS  was asked and was negative except for the information on the HPI  Physical Exam Blood pressure (!) 138/97, pulse (!) 116, temperature 98.7 F (37.1 C), temperature source Oral, resp. rate 19, height 5' (1.524 m), weight 67.1 kg, SpO2 100%. CONSTITUTIONAL: NAD. EYES: Pupils are equal, round, Sclera are non-icteric. EARS, NOSE, MOUTH AND THROAT: The oropharynx is clear. The oral mucosa is pink and moist. Hearing is intact to voice. LYMPH NODES:  Lymph nodes in the neck are normal. RESPIRATORY:  Lungs are clear. There is normal respiratory effort, with equal breath sounds bilaterally, and without pathologic use  of accessory muscles. CARDIOVASCULAR: Heart is regular without murmurs, gallops, or rubs. GI: The abdomen is  soft, nontender, and nondistended. There are no palpable masses. There is no hepatosplenomegaly. There are normal bowel sounds in all quadrants. GU: Rectal deferred.   MUSCULOSKELETAL: Normal muscle strength and tone. No cyanosis or edema.   SKIN: Turgor is good and there are no pathologic skin lesions or ulcers. NEUROLOGIC: Motor and sensation is grossly normal. Cranial nerves are grossly intact. PSYCH:  Oriented to person, place and time. Affect is normal.  Data Reviewed I  have personally reviewed the patient's imaging, laboratory findings and medical records.    Assessment/Plan 73 very pleasant female with some retrosternal pain nausea diarrhea, associated with gallstones.  Definitely symptoms can be explained by biliary colic although symptomatology is not clear-cut.  Discussed with the patient in detail differential diagnosis includes symptomatic hiatal hernia versus inflammatory bowel disease or side effects related to Ozempic . She is diabetic and certainly have cholelithiasis. Options given to the patient to include observation and further workup by GI versus performing cholecystectomy during this admission. I do think that given her diabetes her GI symptoms and the evidence of stones she will probably need cholecystectomy at some point in time.  Patient wishes to proceed with from cholecystectomy I will be happy to do it tomorrow.  She is in agreement. Completely understands that there is a potential that her symptoms may not resolve even after cholecystectomy but that is also par of excluding gallbladder disease  The risks, benefits, complications, treatment options, and expected outcomes were discussed with the patient. The possibilities of bleeding, recurrent infection, finding a normal gallbladder, perforation of viscus organs, damage to surrounding structures, bile leak,  abscess formation, needing a drain placed, the need for additional procedures, reaction to medication, pulmonary aspiration,  failure to diagnose a condition, the possible need to convert to an open procedure, and creating a complication requiring transfusion or operation were discussed with the patient. The patient and/or family concurred with the proposed plan, giving informed consent.  I personally spent a total of 75 minutes in the care of the patient today including performing a medically appropriate exam/evaluation, counseling and educating, placing orders, referring and communicating with other health care professionals, documenting clinical information in the EHR, independently interpreting and reviewing images studies and coordinating care.    Laneta Luna, MD FACS General Surgeon 04/20/2024, 4:46 PM      [1]  Social History Tobacco Use   Smoking status: Never   Smokeless tobacco: Never  Vaping Use   Vaping status: Never Used  Substance Use Topics   Alcohol use: Not Currently    Alcohol/week: 1.0 standard drink of alcohol    Types: 1 Cans of beer per week    Comment: occassionally   Drug use: No  [2]  Allergies Allergen Reactions   Atorvastatin      Liver enzyme elevation    Celebrex  [Celecoxib ] Other (See Comments)    Liver enzyme elevation    Thimerosal (Thiomersal) Itching

## 2024-04-20 NOTE — ED Provider Notes (Addendum)
 "  [facility] Provider Note    Event Date/Time   First MD Initiated Contact with Patient 04/20/24 1500     (approximate)   History   Abdominal Pain   Katherine Jimenez is a 74 y.o. female past medical history significant for hypertension, diabetes, hyperlipidemia, recent ultrasound that showed cholelithiasis and a large hiatal hernia with no signs of acute cholecystitis who presents to the emergency department with abdominal pain -presents with diarrhea, nausea and vomiting that has been intermittent for the past 2 weeks.  Also complaining of some chest pain today that is worse in her right shoulder.  Patient states that she has had intermittent problems over the past couple of weeks.  States that she will have bouts of 4 to 6 hours where she has abdominal pain that radiates to her right shoulder blade, diarrhea and vomiting.  States that she had a recent ultrasound that showed signs of cholelithiasis but no findings of acute cholecystitis.  Denies any fever or chills.  Was complaining of some middle chest burning sensation today.  States that she had a recent cardiac workup back in November and everything was reassuring.  Denies any dysuria, urinary urgency or frequency.  States that she thought she was having urinary symptoms started a bladder pill recently.     Physical Exam   Triage Vital Signs: ED Triage Vitals [04/20/24 1434]  Encounter Vitals Group     BP (!) 138/97     Girls Systolic BP Percentile      Girls Diastolic BP Percentile      Boys Systolic BP Percentile      Boys Diastolic BP Percentile      Pulse Rate (!) 116     Resp 19     Temp 98.7 F (37.1 C)     Temp Source Oral     SpO2 100 %     Weight 148 lb (67.1 kg)     Height 5' (1.524 m)     Head Circumference      Peak Flow      Pain Score 3     Pain Loc      Pain Education      Exclude from Growth Chart     Most recent vital signs: Vitals:   04/20/24 1434  BP: (!) 138/97  Pulse:  (!) 116  Resp: 19  Temp: 98.7 F (37.1 C)  SpO2: 100%    Physical Exam Constitutional:      Appearance: She is well-developed.  HENT:     Head: Atraumatic.  Eyes:     Conjunctiva/sclera: Conjunctivae normal.  Cardiovascular:     Rate and Rhythm: Regular rhythm. Tachycardia present.  Pulmonary:     Effort: No respiratory distress.  Abdominal:     General: There is no distension.     Tenderness: There is abdominal tenderness in the right upper quadrant. Negative signs include Murphy's sign.  Musculoskeletal:        General: Normal range of motion.     Cervical back: Normal range of motion.  Skin:    General: Skin is warm.  Neurological:     Mental Status: She is alert. Mental status is at baseline.     IMPRESSION / MDM / ASSESSMENT AND PLAN / ED COURSE  I reviewed the triage vital signs and the nursing notes.  Differential diagnosis including symptomatic cholelithiasis, acute cholecystitis, pancreatitis, gastritis/PUD from known hiatal hernia, ACS, anemia  Afebrile, tachycardic on arrival  EKG  I, Katherine Jimenez, the attending physician, personally viewed and interpreted this ECG.  EKG showed sinus tachycardia with a heart rate of 110.  Narrow complex.  Normal intervals.  Nonspecific ST changes.  No significant ST elevation or depression.  No findings of acute ischemia or dysrhythmia.  No tachycardic or bradycardic dysrhythmias while on cardiac telemetry.  RADIOLOGY Right upper quadrant ultrasound on Friday that showed cholelithiasis but no signs of acute cholecystitis also noted a large hiatal hernia.  CT scan abdomen and pelvis with contrast showed findings concerning for acute cholecystitis.  No obvious choledocholithiasis. -Read as cholelithiasis.  Hiatal hernia.  Significant stool noted throughout the colon.  Diverticulosis but no findings of acute diverticulitis  LABS (all labs ordered are listed, but only abnormal results are displayed) Labs interpreted as -     Labs Reviewed  CBC WITH DIFFERENTIAL/PLATELET - Abnormal; Notable for the following components:      Result Value   Platelets 440 (*)    Abs Immature Granulocytes 0.08 (*)    All other components within normal limits  COMPREHENSIVE METABOLIC PANEL WITH GFR - Abnormal; Notable for the following components:   Glucose, Bld 154 (*)    Alkaline Phosphatase 134 (*)    All other components within normal limits  LIPASE, BLOOD  TROPONIN T, HIGH SENSITIVITY  TROPONIN T, HIGH SENSITIVITY     MDM  Lab work with no leukocytosis.  No significant anemia.  Platelets 440.  Normal LFTs and T. bili.  Have low suspicion for choledocholithiasis.  Normal lipase.  Reevaluation patient was given IV fluids and pain medication.  No longer tachycardic.  No fever or chills.  I have a low suspicion for acute cholecystitis.  Troponin is negative have low suspicion for ACS.  Most likely symptomatic cholelithiasis given her ongoing radiating pain to her right shoulder blade.  Discussed elective cholecystectomy.  Clinical Course as of 04/20/24 1803  Katherine Jimenez 18, 2025  1713 Discussed patient's case with Dr. Harlen who recommended CT scan abdomen and pelvis for further evaluation.  Discussed possible elective cholecystectomy which the patient states that she would want elective cholecystectomy despite not being under percent sure that this is her gallbladder.  No concern for repeat ultrasound at this time.  Initial troponin is negative, atypical chest pain and recent cardiac workup and November.  Plan for hospitalization and admission with hospitalist for likely elective cholecystectomy tomorrow morning. [SM]    Clinical Course User Index [SM] Jimenez Clotilda, MD   My evaluation does have cholelithiasis but no obvious findings of choledocholithiasis or acute cholecystitis on CT scan.  Consulted hospitalist for admission for chest pain and concern for symptomatic cholelithiasis.  PROCEDURES:  Critical Care  performed: No  Procedures  Patient's presentation is most consistent with acute presentation with potential threat to life or bodily function.   MEDICATIONS ORDERED IN ED: Medications  ceFAZolin  (ANCEF ) IVPB 2g/100 mL premix (has no administration in time range)  Chlorhexidine  Gluconate Cloth 2 % PADS 6 each (has no administration in time range)  lactated ringers  bolus 1,000 mL (1,000 mLs Intravenous New Bag/Given 04/20/24 1553)  morphine  (PF) 4 MG/ML injection 4 mg (4 mg Intravenous Given 04/20/24 1553)  iohexol  (OMNIPAQUE ) 300 MG/ML solution 100 mL (100 mLs Intravenous Contrast Given 04/20/24 1717)    FINAL CLINICAL IMPRESSION(S) / ED DIAGNOSES   Final diagnoses:  Right upper quadrant abdominal pain  Right-sided chest pain  Symptomatic cholelithiasis     Rx / DC Orders   ED  Discharge Orders     None        Note:  This document was prepared using Dragon voice recognition software and may include unintentional dictation errors.   Suzanne Kirsch, MD 04/20/24 1751    Suzanne Kirsch, MD 04/20/24 1803  "

## 2024-04-20 NOTE — ED Notes (Signed)
 Patient transported to CT

## 2024-04-20 NOTE — H&P (Signed)
 " History and Physical    Katherine Jimenez FMW:[id number] DOB: [birth year] DOA: 04/20/2024  DOS: the patient was seen and examined on 04/20/2024  PCP: Marylynn Verneita CROME, MD   Patient coming from: Home  I have personally briefly reviewed patient's old medical records in [facility] and CareEverywhere  HPI:   Katherine Jimenez is a 74 y.o. year old female with medical history of hypertension, hyperlipidemia, type 2 diabetes, history of CVA presenting to the ED with right upper quadrant abdominal pain.  She also reports nausea vomiting and diarrhea.  She had ultrasound yesterday that showed cholelithiasis without sonographic cholecystitis.  States her symptoms are [place] for the last 2 to 3 weeks and have been intermittent in nature.  She denies any URI symptoms.  Denies any dysuria or other urinary symptoms.  Denies any fevers or chills. On arrival to the ED patient was noted to be HDS stable.  Lab work and imaging obtained.  CBC overall unremarkable aside from thrombocytosis.  CMP done.  Overall unremarkable except mild ALP elevation that has been chronic for the patient.  Lipase normal.  Troponin normal.  CT abdomen pelvis without any acute findings but did show cholelithiasis along with moderate size hiatal hernia and moderate stool burden.  General surgery was consulted by EDP who discussed multiple treatment and evaluation options and patient wanted to undergo cholecystectomy knowing this may not resolve her issues.  General surgery planning on cholecystectomy tomorrow early p.m. Given this, [facility] contacted for admission.  Review of Systems: As mentioned in the history of present illness. All other systems reviewed and are negative.   Past Medical History:  Diagnosis Date   Arthritis    back, knees, fingers   Bipolar disorder (HCC)    Bunion, left    CVA (cerebral vascular accident) (HCC)    Depression    Diabetes mellitus    diet controlled.    Elevated liver enzymes 05/31/2021    Hypertension    Lab test positive for detection of COVID-19 virus 03/02/2021   Lower back pain    S/P fall   Squamous cell skin cancer, face    [facility]   Treadmill stress test negative for angina pectoris 09/2011    Past Surgical History:  Procedure Laterality Date   BLADDER AUGMENTATION     BLADDER SURGERY     CARDIAC CATHETERIZATION  03/05/12   no stents   CATARACT EXTRACTION W/PHACO Right 07/19/2015   Procedure: CATARACT EXTRACTION PHACO AND INTRAOCULAR LENS PLACEMENT (IOC) right eye;  Surgeon: Donzell Arlyce Budd, MD;  Location: [facility];  Service: Ophthalmology;  Laterality: Right;  BORDERLINE DIABETIC - oral meds   CATARACT EXTRACTION W/PHACO Left 05/28/2017   Procedure: CATARACT EXTRACTION PHACO AND INTRAOCULAR LENS PLACEMENT (IOC) LEFT DIABETIC;  Surgeon: Mittie Gaskin, MD;  Location: [facility];  Service: Ophthalmology;  Laterality: Left;  Diabetic - oal meds   JOINT REPLACEMENT  2012   bilateral hip   KNEE ARTHROSCOPY     TOTAL HIP ARTHROPLASTY  2012   TOTAL KNEE ARTHROPLASTY Right 06/04/2017   Procedure: TOTAL KNEE ARTHROPLASTY;  Surgeon: Leora Lynwood SAUNDERS, MD;  Location: [facility];  Service: Orthopedics;  Laterality: Right;   TUBAL LIGATION     VAGINAL DELIVERY     2     Allergies[1]  Family History  Problem Relation Age of Onset   Diabetes Mother    Heart disease Mother    Stroke Mother    Diabetes Father  Heart disease Father    Dementia Father    Diabetes Sister    Diabetes Maternal Aunt    Breast cancer Maternal Aunt    Diabetes Maternal Uncle    Heart disease Maternal Grandmother    Diabetes Maternal Grandfather    Heart disease Maternal Grandfather    Diabetes Paternal Grandmother    Heart disease Paternal Grandmother    Heart disease Paternal Grandfather     Prior to Admission medications  Medication Sig Start Date End Date Taking? Authorizing Provider  acetaminophen  (TYLENOL ) 325 MG tablet Take 650 mg by mouth every  6 (six) hours as needed.    [provider]  aspirin  EC 81 MG tablet Take 81 mg by mouth daily. Swallow whole.    [provider]  Biotin  5 MG CAPS Take 2 capsules by mouth 2 (two) times daily.     [provider]  BLACK COHOSH EXTRACT PO Take 1 capsule by mouth daily.    [provider]  blood glucose meter kit and supplies Dispense Contour next bayer meter. E11.9 To check blood glucose  Once a day. 08/31/15   Marylynn Verneita CROME, MD  busPIRone  (BUSPAR ) 30 MG tablet Take 1 tablet (30 mg total) by mouth in the morning and at bedtime. 11/07/23   Marylynn Verneita CROME, MD  Cholecalciferol  (VITAMIN D3) 1000 units CAPS Take 1,000 Units by mouth daily.     [provider]  DULoxetine  (CYMBALTA ) 60 MG capsule Take 1 capsule (60 mg total) by mouth daily. 12/13/23   Marylynn Verneita CROME, MD  glucose blood (FREESTYLE LITE) test strip Use once daily  as instructed to test blood sugar E 11.9 04/29/14   Marylynn Verneita CROME, MD  lamoTRIgine  (LAMICTAL ) 200 MG tablet TAKE 1 TABLET BY MOUTH 2 TIMES A DAY 11/19/23   Marylynn Verneita CROME, MD  Lancets (FREESTYLE) lancets Use once daily to check blood sugars  E11.9 04/29/14   Marylynn Verneita CROME, MD  losartan  (COZAAR ) 50 MG tablet Take 1 tablet (50 mg total) by mouth daily. 11/07/23   Marylynn Verneita CROME, MD  meloxicam  (MOBIC ) 15 MG tablet Take 15 mg by mouth daily. 01/29/24   [provider]  Na Sulfate-K Sulfate-Mg Sulfate concentrate (SUPREP) 17.5-3.13-1.6 GM/177ML SOLN Take 1 Bottle by mouth. Patient not taking: Reported on 02/26/2024 02/01/24   [provider]  omeprazole  (PRILOSEC) 40 MG capsule Take 1 capsule (40 mg total) by mouth daily. 11/07/23   Marylynn Verneita CROME, MD  Semaglutide ,0.25 or 0.5MG /DOS, (OZEMPIC , 0.25 OR 0.5 MG/DOSE,) 2 MG/1.5ML SOPN Inject 0.25 mg into the skin once a week. 12/10/23   Dineen Rollene MATSU, FNP  trospium  (SANCTURA ) 20 MG tablet Take 1 tablet (20 mg total) by mouth 2 (two) times daily. 12/31/23   Stoioff, Glendia BROCKS, MD   zolpidem  (AMBIEN ) 10 MG tablet TAKE 1 TABLET BY MOUTH EVERY NIGHT AT BEDTIME AS NEEDED FOR SLEEP 12/19/23   Marylynn Verneita CROME, MD    Social History:  reports that she has never smoked. She has never used smokeless tobacco. She reports that she does not currently use alcohol after a past usage of about 1.0 standard drink of alcohol per week. She reports that she does not use drugs.    Physical Exam: Vitals:   04/20/24 1434  BP: (!) 138/97  Pulse: (!) 116  Resp: 19  Temp: 98.7 F (37.1 C)  TempSrc: Oral  SpO2: 100%  Weight: 67.1 kg  Height: 5' (1.524 m)    Gen:  NAD HENT: NCAT CV: normal heart sounds, good radial pulse Lung: CTAB Abd: No TTP, Murphy's sign negative, normal bowel sounds MSK: No asymmetry, good bulk and tone, pain with ROM of right arm Neuro: alert and oriented   Labs on Admission: I have personally reviewed following labs and imaging studies  CBC: Recent Labs  Lab 04/20/24 1437  WBC 7.5  NEUTROABS 3.2  HGB 13.9  HCT 43.4  MCV 94.1  PLT 440*   Basic Metabolic Panel: Recent Labs  Lab 04/20/24 1437  NA 135  K 4.0  CL 98  CO2 25  GLUCOSE 154*  BUN 11  CREATININE 0.83  CALCIUM  9.7   GFR: Estimated Creatinine Clearance: 52.3 mL/min (by C-G formula based on SCr of 0.83 mg/dL). Liver Function Tests: Recent Labs  Lab 04/20/24 1437  AST 16  ALT 11  ALKPHOS 134*  BILITOT 0.3  PROT 7.4  ALBUMIN 4.5   Recent Labs  Lab 04/20/24 1437  LIPASE 23   No results for input(s): AMMONIA in the last 168 hours. Coagulation Profile: No results for input(s): INR, PROTIME in the last 168 hours. Cardiac Enzymes: No results for input(s): CKTOTAL, CKMB, CKMBINDEX, TROPONINI, TROPONINIHS in the last 168 hours. BNP (last 3 results) No results for input(s): BNP in the last 8760 hours. HbA1C: No results for input(s): HGBA1C in the last 72 hours. CBG: No results for input(s): GLUCAP in the last 168 hours. Lipid Profile: No results  for input(s): CHOL, HDL, LDLCALC, TRIG, CHOLHDL, LDLDIRECT in the last 72 hours. Thyroid  Function Tests: No results for input(s): TSH, T4TOTAL, FREET4, T3FREE, THYROIDAB in the last 72 hours. Anemia Panel: No results for input(s): VITAMINB12, FOLATE, FERRITIN, TIBC, IRON , RETICCTPCT in the last 72 hours. Urine analysis:    Component Value Date/Time   COLORURINE YELLOW 11/07/2023 0924   APPEARANCEUR Clear 12/31/2023 1509   LABSPEC 1.025 11/07/2023 0924   PHURINE 6.0 11/07/2023 0924   GLUCOSEU Negative 12/31/2023 1509   GLUCOSEU NEGATIVE 11/07/2023 0924   HGBUR NEGATIVE 11/07/2023 0924   BILIRUBINUR negative 02/26/2024 0919   BILIRUBINUR Negative 12/31/2023 1509   KETONESUR TRACE (A) 11/07/2023 0924   PROTEINUR Positive (A) 02/26/2024 0919   PROTEINUR Trace 12/31/2023 1509   PROTEINUR NEGATIVE 08/06/2021 1258   UROBILINOGEN 0.2 02/26/2024 0919   UROBILINOGEN 1.0 11/07/2023 0924   NITRITE positive (A) 02/26/2024 0919   NITRITE Positive (A) 12/31/2023 1509   NITRITE NEGATIVE 11/07/2023 0924   LEUKOCYTESUR Large (3+) (A) 02/26/2024 0919   LEUKOCYTESUR 1+ (A) 12/31/2023 1509   LEUKOCYTESUR NEGATIVE 11/07/2023 0924    Radiological Exams on Admission: I have personally reviewed images CT ABDOMEN PELVIS W CONTRAST Result Date: 04/20/2024 EXAM: CT ABDOMEN AND PELVIS WITH CONTRAST 04/20/2024 05:34:30 PM TECHNIQUE: CT of the abdomen and pelvis was performed with the administration of 100 mL of iohexol  (OMNIPAQUE ) 300 MG/ML solution. Multiplanar reformatted images are provided for review. Automated exposure control, iterative reconstruction, and/or weight-based adjustment of the mA/kV was utilized to reduce the radiation dose to as low as reasonably achievable. COMPARISON: CT abdomen and pelvis 08/19/2018. CLINICAL HISTORY: Abdominal pain, acute, nonlocalized; upper abd pain, n/v/d. FINDINGS: LOWER CHEST: No acute abnormality. LIVER: The liver is unremarkable.  GALLBLADDER AND BILE DUCTS: Gallstones are present. No biliary ductal dilatation. SPLEEN: No acute abnormality. PANCREAS: No acute abnormality. ADRENAL GLANDS: No acute abnormality. KIDNEYS, URETERS AND BLADDER: No stones in the kidneys or ureters. No hydronephrosis. No perinephric or periureteral stranding. There is limited evaluation of the bladder secondary to streak  artifact. GI AND BOWEL: There is a moderate sized hiatal hernia containing the proximal stomach which has increased in size. There is a large amount of stool throughout the colon. There is sigmoid colon diverticulosis. The appendix appears normal. There is no bowel obstruction. PERITONEUM AND RETROPERITONEUM: No ascites. No free air. VASCULATURE: There are atherosclerotic calcifications of the aorta. Aorta is normal in caliber. LYMPH NODES: No lymphadenopathy. REPRODUCTIVE ORGANS: No acute abnormality. BONES AND SOFT TISSUES: Bilateral hip arthroplasties are present. There is scoliosis of the thoracolumbar spine. No acute osseous abnormality. No focal soft tissue abnormality. IMPRESSION: 1. No acute findings. 2. Moderate sized hiatal hernia containing the proximal stomach, increased in size compared to 2020. 3. Sigmoid colon diverticulosis without evidence of diverticulitis. 4. Large amount of stool throughout the colon. 5. Cholelithiasis. Electronically signed by: Greig Pique MD 04/20/2024 05:54 PM EST RP Workstation: [id number]   DG Chest 2 View Result Date: 04/20/2024 EXAM: 2 VIEW(S) XRAY OF THE CHEST 04/20/2024 03:13:00 PM COMPARISON: 02/15/2024 CLINICAL HISTORY: chest pain chest pain chest pain FINDINGS: LUNGS AND PLEURA: No focal pulmonary opacity. No pleural effusion. No pneumothorax. HEART AND MEDIASTINUM: Small hiatal hernia. No acute abnormality of the cardiac silhouette. BONES AND SOFT TISSUES: S shaped thoracolumbar spine scoliosis. Thoracic degenerative changes. IMPRESSION: 1. No acute cardiopulmonary abnormality. 2. Small hiatal  hernia. Electronically signed by: Greig Pique MD 04/20/2024 04:01 PM EST RP Workstation: [id number]    EKG: My personal interpretation of EKG shows: Sinus tachycardia without any acute ST changes    Assessment/Plan Principal Problem:   Right upper quadrant pain Active Problems:   Cholecystitis, acute   Hypertension   Bipolar disorder (HCC)   Overweight (BMI 25.0-29.9)   Vascular dementia, mild, with anxiety (HCC)   Hyperlipidemia associated with type 2 diabetes mellitus (HCC)   History of CVA (cerebrovascular accident) without residual deficits   Elevated alkaline phosphatase level   DM type 2, controlled, with complication (HCC)   Patient with right upper quadrant pain along with GI symptoms of nausea and vomiting that have been intermittent admitted for cholecystectomy.  Plan for cholecystectomy tomorrow.  General surgery has evaluated patient and discussed this in detail and patient would like to proceed.  Patient will remain n.p.o. after midnight.  Patient will be given 1 dose of heparin  for VTE prophylaxis and placed on SCDs in anticipation for surgery. Pain is also consistent with [facility] eitiology but pt is persistent on getting her gallbladder out.  If symptoms persist despite surgery she may need to stop NSAIDs as she is currently on meloxicam  and need endoscopy.  This can however be done in the outpatient setting.  Hypertension: Continue home medicine.  Hyperlipidemia/history of CVA: Continue home statin.  Hold home aspirin .  GERD: Continue home PPI.  Symptoms are worsening given her hiatal hernia.  Bipolar disorder: Continue home medicines  Elevated ALP: Has been noted outpatient and workup has been initiated outpatient.  Per previous notes GI recommends MRCP.  This persists.  Will defer this to outpatient setting.  Type 2 diabetes: Does not appear to be on any pharmacotherapy.  Will place on SSI given high risk periop period.   VTE prophylaxis:  SQ Heparin , SCD Diet:  N.p.o. after midnight Code Status:  Full Code Telemetry:  Admission status: Observation, Med-Surg Patient is from: Home Anticipated d/c is to: Home Anticipated d/c is in: 1-2 days   Family Communication: Updated at bedside  Consults called: Gen surg   Severity of Illness: The appropriate patient status for this  patient is OBSERVATION. Observation status is judged to be reasonable and necessary in order to provide the required intensity of service to ensure the patient's safety. The patient's presenting symptoms, physical exam findings, and initial radiographic and laboratory data in the context of their medical condition is felt to place them at decreased risk for further clinical deterioration. Furthermore, it is anticipated that the patient will be medically stable for discharge from the hospital within 2 midnights of admission.    Morene Bathe, MD [facility]     [1]  Allergies Allergen Reactions   Atorvastatin      Liver enzyme elevation    Celebrex  [Celecoxib ] Other (See Comments)    Liver enzyme elevation    Thimerosal (Thiomersal) Itching   "

## 2024-04-20 NOTE — ED Triage Notes (Signed)
 Pt had gallbladder US  Friday that shows she has gallstones. Pt also has known hiatal hernia. Pt c/o diarrhea and n/v that have been intermittent x2 weeks. Pt reports referred pain to her R shoulder and also endorses some chest pain.

## 2024-04-21 ENCOUNTER — Encounter: Admission: EM | Disposition: A | Payer: Self-pay | Source: Home / Self Care | Attending: Emergency Medicine

## 2024-04-21 ENCOUNTER — Observation Stay: Admitting: Anesthesiology

## 2024-04-21 ENCOUNTER — Encounter: Payer: Self-pay | Admitting: Internal Medicine

## 2024-04-21 DIAGNOSIS — K8020 Calculus of gallbladder without cholecystitis without obstruction: Secondary | ICD-10-CM | POA: Diagnosis not present

## 2024-04-21 LAB — CBC
HCT: 35.7 % — ABNORMAL LOW (ref 36.0–46.0)
Hemoglobin: 11.4 g/dL — ABNORMAL LOW (ref 12.0–15.0)
MCH: 30.2 pg (ref 26.0–34.0)
MCHC: 31.9 g/dL (ref 30.0–36.0)
MCV: 94.7 fL (ref 80.0–100.0)
Platelets: 315 K/uL (ref 150–400)
RBC: 3.77 MIL/uL — ABNORMAL LOW (ref 3.87–5.11)
RDW: 13.5 % (ref 11.5–15.5)
WBC: 5.1 K/uL (ref 4.0–10.5)
nRBC: 0 % (ref 0.0–0.2)

## 2024-04-21 LAB — BASIC METABOLIC PANEL WITH GFR
Anion gap: 7 (ref 5–15)
BUN: 10 mg/dL (ref 8–23)
CO2: 29 mmol/L (ref 22–32)
Calcium: 9.3 mg/dL (ref 8.9–10.3)
Chloride: 101 mmol/L (ref 98–111)
Creatinine, Ser: 0.78 mg/dL (ref 0.44–1.00)
GFR, Estimated: >60 mL/min
Glucose, Bld: 102 mg/dL — ABNORMAL HIGH (ref 70–99)
Potassium: 4.4 mmol/L (ref 3.5–5.1)
Sodium: 137 mmol/L (ref 135–145)

## 2024-04-21 LAB — GLUCOSE, CAPILLARY
Glucose-Capillary: 90 mg/dL (ref 70–99)
Glucose-Capillary: 97 mg/dL (ref 70–99)
Glucose-Capillary: 96 mg/dL (ref 70–99)
Glucose-Capillary: 166 mg/dL — ABNORMAL HIGH (ref 70–99)
Glucose-Capillary: 103 mg/dL — ABNORMAL HIGH (ref 70–99)

## 2024-04-21 MED ORDER — PHENYLEPHRINE 80 MCG/ML (10ML) SYRINGE FOR IV PUSH (FOR BLOOD PRESSURE SUPPORT)
PREFILLED_SYRINGE | INTRAVENOUS | Status: DC | PRN
  Administered 2024-04-21 (×2): 160 ug via INTRAVENOUS

## 2024-04-21 MED ORDER — ONDANSETRON HCL 4 MG/2ML IJ SOLN
INTRAMUSCULAR | Status: DC | PRN
  Administered 2024-04-21: 4 mg via INTRAVENOUS

## 2024-04-21 MED ORDER — ROCURONIUM BROMIDE 100 MG/10ML IV SOLN
INTRAVENOUS | Status: DC | PRN
  Administered 2024-04-21: 30 mg via INTRAVENOUS
  Administered 2024-04-21: 50 mg via INTRAVENOUS

## 2024-04-21 MED ORDER — BUPIVACAINE-EPINEPHRINE (PF) 0.5% -1:200000 IJ SOLN
INTRAMUSCULAR | Status: AC
  Filled 2024-04-21: qty 30

## 2024-04-21 MED ORDER — FENTANYL CITRATE (PF) 100 MCG/2ML IJ SOLN
25.0000 ug | INTRAMUSCULAR | Status: AC | PRN
  Administered 2024-04-21 (×8): 25 ug via INTRAVENOUS

## 2024-04-21 MED ORDER — PROPOFOL 10 MG/ML IV BOLUS
INTRAVENOUS | Status: AC
  Filled 2024-04-21: qty 20

## 2024-04-21 MED ORDER — CEFAZOLIN SODIUM-DEXTROSE 2-4 GM/100ML-% IV SOLN
INTRAVENOUS | Status: AC
  Filled 2024-04-21: qty 100

## 2024-04-21 MED ORDER — FENTANYL CITRATE (PF) 100 MCG/2ML IJ SOLN
INTRAMUSCULAR | Status: DC | PRN
  Administered 2024-04-21: 100 ug via INTRAVENOUS

## 2024-04-21 MED ORDER — INSULIN ASPART 100 UNIT/ML IJ SOLN
INTRAMUSCULAR | Status: AC
  Filled 2024-04-21: qty 2

## 2024-04-21 MED ORDER — DROPERIDOL 2.5 MG/ML IJ SOLN
INTRAMUSCULAR | Status: AC
  Filled 2024-04-21: qty 2

## 2024-04-21 MED ORDER — FENTANYL CITRATE (PF) 100 MCG/2ML IJ SOLN
INTRAMUSCULAR | Status: AC
  Filled 2024-04-21: qty 2

## 2024-04-21 MED ORDER — BUPIVACAINE-EPINEPHRINE (PF) 0.25% -1:200000 IJ SOLN
INTRAMUSCULAR | Status: AC
  Filled 2024-04-21: qty 30

## 2024-04-21 MED ORDER — BUPIVACAINE-EPINEPHRINE 0.5% -1:200000 IJ SOLN
INTRAMUSCULAR | Status: DC | PRN
  Administered 2024-04-21: 30 mL

## 2024-04-21 MED ORDER — MIDAZOLAM HCL 2 MG/2ML IJ SOLN
INTRAMUSCULAR | Status: AC
  Filled 2024-04-21: qty 2

## 2024-04-21 MED ORDER — PROPOFOL 1000 MG/100ML IV EMUL
INTRAVENOUS | Status: AC
  Filled 2024-04-21: qty 100

## 2024-04-21 MED ORDER — ACETAMINOPHEN 500 MG PO TABS
1000.0000 mg | ORAL_TABLET | Freq: Four times a day (QID) | ORAL | Status: DC
  Administered 2024-04-21 – 2024-04-22 (×2): 1000 mg via ORAL
  Filled 2024-04-21 (×2): qty 2

## 2024-04-21 MED ORDER — MIDAZOLAM HCL (PF) 2 MG/2ML IJ SOLN
INTRAMUSCULAR | Status: DC | PRN
  Administered 2024-04-21: 2 mg via INTRAVENOUS

## 2024-04-21 MED ORDER — PROPOFOL 500 MG/50ML IV EMUL
INTRAVENOUS | Status: DC | PRN
  Administered 2024-04-21: 125 ug/kg/min via INTRAVENOUS

## 2024-04-21 MED ORDER — PROPOFOL 10 MG/ML IV BOLUS
INTRAVENOUS | Status: DC | PRN
  Administered 2024-04-21: 120 mg via INTRAVENOUS

## 2024-04-21 MED ORDER — DROPERIDOL 2.5 MG/ML IJ SOLN
0.6250 mg | Freq: Once | INTRAMUSCULAR | Status: AC
  Administered 2024-04-21: 0.625 mg via INTRAVENOUS

## 2024-04-21 MED ORDER — SUGAMMADEX SODIUM 200 MG/2ML IV SOLN
INTRAVENOUS | Status: DC | PRN
  Administered 2024-04-21: 200 mg via INTRAVENOUS

## 2024-04-21 MED ORDER — LIDOCAINE HCL (CARDIAC) PF 100 MG/5ML IV SOSY
PREFILLED_SYRINGE | INTRAVENOUS | Status: DC | PRN
  Administered 2024-04-21: 60 mg via INTRAVENOUS

## 2024-04-21 MED ORDER — ONDANSETRON HCL 4 MG/2ML IJ SOLN: 4.0000 mg | Freq: Once | INTRAMUSCULAR | Status: DC | PRN

## 2024-04-21 MED ORDER — INDOCYANINE GREEN 25 MG IJ SOLR
1.2500 mg | Freq: Once | INTRAMUSCULAR | Status: AC
  Administered 2024-04-21: 1.25 mg via INTRAVENOUS
  Filled 2024-04-21: qty 10

## 2024-04-21 MED ORDER — 0.9 % SODIUM CHLORIDE (POUR BTL) OPTIME
TOPICAL | Status: DC | PRN
  Administered 2024-04-21: 400 mL

## 2024-04-21 MED ORDER — OXYCODONE HCL 5 MG/5ML PO SOLN: 5.0000 mg | Freq: Once | ORAL | Status: DC | PRN

## 2024-04-21 MED ORDER — OXYCODONE HCL 5 MG PO TABS: 5.0000 mg | ORAL_TABLET | Freq: Once | ORAL | Status: DC | PRN

## 2024-04-21 NOTE — Anesthesia Preprocedure Evaluation (Addendum)
 "                                  Anesthesia Evaluation  Patient identified by MRN, date of birth, ID band Patient awake    Reviewed: Allergy & Precautions, NPO status , Patient's Chart, lab work & pertinent test results  Airway Mallampati: III  TM Distance: >3 FB Neck ROM: Full    Dental  (+) Teeth Intact   Pulmonary neg pulmonary ROS   Pulmonary exam normal breath sounds clear to auscultation       Cardiovascular Exercise Tolerance: Good hypertension, Pt. on medications Normal cardiovascular exam Rhythm:Regular Rate:Normal     Neuro/Psych   Anxiety  Bipolar Disorder   CVA negative neurological ROS  negative psych ROS   GI/Hepatic negative GI ROS, Neg liver ROS, hiatal hernia,GERD  ,,  Endo/Other  diabetes, Type 2    Renal/GU negative Renal ROS     Musculoskeletal  (+)  Fibromyalgia -  Abdominal Normal abdominal exam  (+)   Peds negative pediatric ROS (+)  Hematology negative hematology ROS (+) Blood dyscrasia, anemia   Anesthesia Other Findings Past Medical History: No date: Arthritis     Comment:  back, knees, fingers No date: Bipolar disorder (HCC) No date: Bunion, left No date: CVA (cerebral vascular accident) (HCC) No date: Depression No date: Diabetes mellitus     Comment:  diet controlled.  05/31/2021: Elevated liver enzymes No date: Hypertension 03/02/2021: Lab test positive for detection of COVID-19 virus No date: Lower back pain     Comment:  S/P fall No date: Squamous cell skin cancer, face     Comment:  [facility] 09/2011: Treadmill stress test negative for angina pectoris  Past Surgical History: No date: BLADDER AUGMENTATION No date: BLADDER SURGERY 03/05/12: CARDIAC CATHETERIZATION     Comment:  no stents 07/19/2015: CATARACT EXTRACTION W/PHACO; Right     Comment:  Procedure: CATARACT EXTRACTION PHACO AND INTRAOCULAR               LENS PLACEMENT (IOC) right eye;  Surgeon: Donzell Arlyce Budd, MD;   Location: [facility];  Service:              Ophthalmology;  Laterality: Right;  BORDERLINE DIABETIC -              oral meds 05/28/2017: CATARACT EXTRACTION W/PHACO; Left     Comment:  Procedure: CATARACT EXTRACTION PHACO AND INTRAOCULAR               LENS PLACEMENT (IOC) LEFT DIABETIC;  Surgeon: Mittie Gaskin, MD;  Location: [facility];  Service:               Ophthalmology;  Laterality: Left;  Diabetic - oal meds 2012: JOINT REPLACEMENT     Comment:  bilateral hip No date: KNEE ARTHROSCOPY 2012: TOTAL HIP ARTHROPLASTY 06/04/2017: TOTAL KNEE ARTHROPLASTY; Right     Comment:  Procedure: TOTAL KNEE ARTHROPLASTY;  Surgeon: Leora Lynwood SAUNDERS, MD;  Location: [facility];  Service: Orthopedics;               Laterality: Right; No date: TUBAL LIGATION No date: VAGINAL DELIVERY  Comment:  2  BMI    Body Mass Index: 31.52 kg/m      Reproductive/Obstetrics negative OB ROS                              Anesthesia Physical Anesthesia Plan  ASA: 3  Anesthesia Plan: General   Post-op Pain Management:    Induction: Intravenous  PONV Risk Score and Plan: 1 and Ondansetron , Dexamethasone , Midazolam  and Treatment may vary due to age or medical condition  Airway Management Planned: Oral ETT  Additional Equipment:   Intra-op Plan:   Post-operative Plan: Extubation in OR  Informed Consent: I have reviewed the patients History and Physical, chart, labs and discussed the procedure including the risks, benefits and alternatives for the proposed anesthesia with the patient or authorized representative who has indicated his/her understanding and acceptance.     Dental Advisory Given  Plan Discussed with: CRNA  Anesthesia Plan Comments:          Anesthesia Quick Evaluation  "

## 2024-04-21 NOTE — Plan of Care (Signed)
  Problem: Skin Integrity: Goal: Risk for impaired skin integrity will decrease Outcome: Progressing   Problem: Health Behavior/Discharge Planning: Goal: Ability to manage health-related needs will improve Outcome: Progressing   

## 2024-04-21 NOTE — Plan of Care (Signed)
  Problem: Education: Goal: Ability to describe self-care measures that may prevent or decrease complications (Diabetes Survival Skills Education) will improve Outcome: Progressing   Problem: Coping: Goal: Ability to adjust to condition or change in health will improve Outcome: Progressing

## 2024-04-21 NOTE — Progress Notes (Signed)
 [facility]  - [place] [facility]   PATIENT NAME: Katherine Jimenez    MR#:  [id number]  DATE OF BIRTH:  01-27-1951  SUBJECTIVE:  no family at bedside. Came in with abdominal pain and nausea. Found to have gallstones    VITALS:  Blood pressure (!) 159/115, pulse 95, temperature (!) (P) 96.8 F (36 C), resp. rate (!) 23, height 5' (1.524 m), weight 73.2 kg, SpO2 93%.  PHYSICAL EXAMINATION:   GENERAL:  74 y.o.-year-old patient with no acute distress.  LUNGS: Normal breath sounds bilaterally CARDIOVASCULAR: S1, S2 normal.  ABDOMEN: Soft, +tender, nondistended. Bowel sounds present.  EXTREMITIES: No  edema b/l.    NEUROLOGIC: nonfocal  patient is alert and awake  LABORATORY PANEL:  CBC Recent Labs  Lab 04/21/24 0832  WBC 5.1  HGB 11.4*  HCT 35.7*  PLT 315    Chemistries  Recent Labs  Lab 04/20/24 1437 04/21/24 0832  NA 135 137  K 4.0 4.4  CL 98 101  CO2 25 29  GLUCOSE 154* 102*  BUN 11 10  CREATININE 0.83 0.78  CALCIUM  9.7 9.3  MG 2.1  --   AST 16  --   ALT 11  --   ALKPHOS 134*  --   BILITOT 0.3  --    Cardiac Enzymes No results for input(s): TROPONINI in the last 168 hours. RADIOLOGY:  CT ABDOMEN PELVIS W CONTRAST Result Date: 04/20/2024 EXAM: CT ABDOMEN AND PELVIS WITH CONTRAST 04/20/2024 05:34:30 PM TECHNIQUE: CT of the abdomen and pelvis was performed with the administration of 100 mL of iohexol  (OMNIPAQUE ) 300 MG/ML solution. Multiplanar reformatted images are provided for review. Automated exposure control, iterative reconstruction, and/or weight-based adjustment of the mA/kV was utilized to reduce the radiation dose to as low as reasonably achievable. COMPARISON: CT abdomen and pelvis 08/19/2018. CLINICAL HISTORY: Abdominal pain, acute, nonlocalized; upper abd pain, n/v/d. FINDINGS: LOWER CHEST: No acute abnormality. LIVER: The liver is unremarkable. GALLBLADDER AND BILE DUCTS: Gallstones are present. No biliary ductal dilatation.  SPLEEN: No acute abnormality. PANCREAS: No acute abnormality. ADRENAL GLANDS: No acute abnormality. KIDNEYS, URETERS AND BLADDER: No stones in the kidneys or ureters. No hydronephrosis. No perinephric or periureteral stranding. There is limited evaluation of the bladder secondary to streak artifact. GI AND BOWEL: There is a moderate sized hiatal hernia containing the proximal stomach which has increased in size. There is a large amount of stool throughout the colon. There is sigmoid colon diverticulosis. The appendix appears normal. There is no bowel obstruction. PERITONEUM AND RETROPERITONEUM: No ascites. No free air. VASCULATURE: There are atherosclerotic calcifications of the aorta. Aorta is normal in caliber. LYMPH NODES: No lymphadenopathy. REPRODUCTIVE ORGANS: No acute abnormality. BONES AND SOFT TISSUES: Bilateral hip arthroplasties are present. There is scoliosis of the thoracolumbar spine. No acute osseous abnormality. No focal soft tissue abnormality. IMPRESSION: 1. No acute findings. 2. Moderate sized hiatal hernia containing the proximal stomach, increased in size compared to 2020. 3. Sigmoid colon diverticulosis without evidence of diverticulitis. 4. Large amount of stool throughout the colon. 5. Cholelithiasis. Electronically signed by: Greig Pique MD 04/20/2024 05:54 PM EST RP Workstation: [id number]   DG Chest 2 View Result Date: 04/20/2024 EXAM: 2 VIEW(S) XRAY OF THE CHEST 04/20/2024 03:13:00 PM COMPARISON: 02/15/2024 CLINICAL HISTORY: chest pain chest pain chest pain FINDINGS: LUNGS AND PLEURA: No focal pulmonary opacity. No pleural effusion. No pneumothorax. HEART AND MEDIASTINUM: Small hiatal hernia. No acute abnormality of the cardiac silhouette. BONES AND SOFT TISSUES: S shaped  thoracolumbar spine scoliosis. Thoracic degenerative changes. IMPRESSION: 1. No acute cardiopulmonary abnormality. 2. Small hiatal hernia. Electronically signed by: Greig Pique MD 04/20/2024 04:01 PM EST RP  Workstation: [id number]    Assessment and Plan  Katherine Jimenez is a 74 y.o. year old female with medical history of hypertension, hyperlipidemia, type 2 diabetes, history of CVA presenting to the ED with right upper quadrant abdominal pain.  She also reports nausea vomiting and diarrhea.  She had ultrasound yesterday that showed cholelithiasis without sonographic cholecystitis.  States her symptoms are worsening for the last 2 to 3 weeks and have been intermittent in nature.   CT abdomen pelvis without any acute findings but did show cholelithiasis along with moderate size hiatal hernia and moderate stool burden.   Ultrasound right upper quadrant  Cholelithiasis without sonographic evidence of acute cholecystitis. 2. Large hiatal hernia, better seen on prior chest CT.  Right upper quadrant pain with nausea and vomiting acute cholelithiasis -- surgical consultation with Dr. Harlen. Plans for surgery today --IV fluids --PRN pain meds  Hypertension: Continue home medicine.   Hyperlipidemia/history of CVA: Continue home statin.  Hold home aspirin .   GERD: Continue home PPI.  Symptoms are worsening given her hiatal hernia.   Bipolar disorder: Continue home medicines   Elevated ALP: Has been noted outpatient and workup has been initiated outpatient.  Per previous notes GI recommends MRCP.  This persists.  Will defer this to outpatient setting.   Type 2 diabetes: Does not appear to be on any pharmacotherapy.  Will place on SSI given high risk periop period.   Procedures: Family communication : none at bedside Consults : general surgery CODE STATUS: full DVT Prophylaxis : ambulation Level of care: Med-Surg Status is: Observation The patient remains OBS appropriate and will d/c before 2 midnights.    TOTAL TIME TAKING CARE OF THIS PATIENT: 40 minutes.  >50% time spent on counselling and coordination of care  Note: This dictation was prepared with Dragon dictation along with  smaller phrase technology. Any transcriptional errors that result from this process are unintentional.  Katherine Jimenez M.D    [facility]   CC: Primary care physician; Marylynn Verneita CROME, MD

## 2024-04-21 NOTE — Progress Notes (Signed)
 Hold all morning meds and remain NPO until after surgery, per Dr. Tobie.

## 2024-04-21 NOTE — Op Note (Signed)
 Robotic assisted laparoscopic Cholecystectomy  Pre-operative Diagnosis: symptomatic cholelithiasis  Post-operative Diagnosis: same  Procedure:  Robotic assisted laparoscopic Cholecystectomy  Surgeon: Laneta Luna, MD FACS  Anesthesia: Gen. with endotracheal tube  Findings: Chronic mild Cholecystitis   Estimated Blood Loss: 5cc       Specimens: Gallbladder           Complications: none   Procedure Details  The patient was seen again in the Holding Room. The benefits, complications, treatment options, and expected outcomes were discussed with the patient. The risks of bleeding, infection, recurrence of symptoms, failure to resolve symptoms, bile duct damage, bile duct leak, retained common bile duct stone, bowel injury, any of which could require further surgery and/or ERCP, stent, or papillotomy were reviewed with the patient. The likelihood of improving the patient's symptoms with return to their baseline status is good.  The patient and/or family concurred with the proposed plan, giving informed consent.  The patient was taken to Operating Room, identified  and the procedure verified as Laparoscopic Cholecystectomy.  A Time Out was held and the above information confirmed.  Prior to the induction of general anesthesia, antibiotic prophylaxis was administered. VTE prophylaxis was in place. General endotracheal anesthesia was then administered and tolerated well. After the induction, the abdomen was prepped with Chloraprep and draped in the sterile fashion. The patient was positioned in the supine position.  Cut down technique was used to enter the abdominal cavity and a Hasson trochar was placed after two vicryl stitches were anchored to the fascia. Pneumoperitoneum was then created with CO2 and tolerated well without any adverse changes in the patient's vital signs.  Three 8-mm ports were placed under direct vision. All skin incisions  were infiltrated with a local anesthetic agent  before making the incision and placing the trocars.   The patient was positioned  in reverse Trendelenburg, robot was brought to the surgical field and docked in the standard fashion.  We made sure all the instrumentation was kept indirect view at all times and that there were no collision between the arms. I scrubbed out and went to the console.  The gallbladder was identified, the fundus grasped and retracted cephalad. Adhesions were lysed bluntly. The infundibulum was grasped and retracted laterally, exposing the peritoneum overlying the triangle of Calot. This was then divided and exposed in a blunt fashion. An extended critical view of the cystic duct and cystic artery was obtained.  The cystic duct was clearly identified and bluntly dissected.   Artery and duct were double clipped and divided. Using ICG cholangiography we visualize the cystic duct and CBD w/o evidence of bile injuries. The gallbladder was taken from the gallbladder fossa in a retrograde fashion with the electrocautery.  Hemostasis was achieved with the electrocautery. nspection of the right upper quadrant was performed. No bleeding, bile duct injury or leak, or bowel injury was noted. Robotic instruments and robotic arms were undocked in the standard fashion.  I scrubbed back in.  The gallbladder was removed and placed in an Endocatch bag.   Pneumoperitoneum was released.  The periumbilical port site was closed with interrumpted 0 Vicryl sutures. 4-0 subcuticular Monocryl was used to close the skin. Dermabond was  applied.  The patient was then extubated and brought to the recovery room in stable condition. Sponge, lap, and needle counts were correct at closure and at the conclusion of the case.               Laneta Luna, MD, FACS

## 2024-04-21 NOTE — Anesthesia Procedure Notes (Signed)
 Procedure Name: Intubation Date/Time: 04/21/2024 2:31 PM  Performed by: Brien Sotero PARAS, CRNAPre-anesthesia Checklist: Patient identified, Patient being monitored, Timeout performed, Emergency Drugs available and Suction available Patient Re-evaluated:Patient Re-evaluated prior to induction Oxygen Delivery Method: Circle system utilized Preoxygenation: Pre-oxygenation with 100% oxygen Induction Type: IV induction Ventilation: Mask ventilation without difficulty Laryngoscope Size: 3 and McGrath Grade View: Grade I Tube type: Oral Tube size: 7.0 mm Number of attempts: 1 Airway Equipment and Method: Stylet Placement Confirmation: ETT inserted through vocal cords under direct vision, positive ETCO2 and breath sounds checked- equal and bilateral Secured at: 21 cm Tube secured with: Tape Dental Injury: Teeth and Oropharynx as per pre-operative assessment

## 2024-04-21 NOTE — Transfer of Care (Signed)
 Immediate Anesthesia Transfer of Care Note  Patient: Katherine Jimenez  Procedure(s) Performed: CHOLECYSTECTOMY, ROBOT-ASSISTED, LAPAROSCOPIC  Patient Location: PACU  Anesthesia Type:General  Level of Consciousness: awake  Airway & Oxygen Therapy: Patient Spontanous Breathing and Patient connected to face mask oxygen  Post-op Assessment: Report given to RN and Post -op Vital signs reviewed and stable  Post vital signs: Reviewed and stable  Last Vitals:  Vitals Value Taken Time  BP 159/100 04/21/24 15:37  Temp    Pulse 93 04/21/24 15:44  Resp 10 04/21/24 15:44  SpO2 97 % 04/21/24 15:44  Vitals shown include unfiled device data.  Last Pain:  Vitals:   04/21/24 1318  TempSrc: Temporal  PainSc: 0-No pain         Complications: No notable events documented.

## 2024-04-22 ENCOUNTER — Other Ambulatory Visit: Payer: Self-pay

## 2024-04-22 ENCOUNTER — Encounter: Payer: Self-pay | Admitting: Oncology

## 2024-04-22 LAB — GLUCOSE, CAPILLARY: Glucose-Capillary: 115 mg/dL — ABNORMAL HIGH (ref 70–99)

## 2024-04-22 MED ORDER — BUSPIRONE HCL 30 MG PO TABS: 30.0000 mg | ORAL_TABLET | Freq: Two times a day (BID) | ORAL | Status: AC | PRN

## 2024-04-22 MED ORDER — ACETAMINOPHEN 500 MG PO TABS: 1000.0000 mg | ORAL_TABLET | Freq: Four times a day (QID) | ORAL | Status: AC | PRN

## 2024-04-22 MED ORDER — HYDROCODONE-ACETAMINOPHEN 5-325 MG PO TABS
1.0000 | ORAL_TABLET | Freq: Four times a day (QID) | ORAL | 0 refills | Status: AC | PRN
  Filled 2024-04-22: qty 10, 3d supply, fill #0

## 2024-04-22 NOTE — Telephone Encounter (Signed)
 This call has already been addressed.  Adding Access note to chart.

## 2024-04-22 NOTE — Discharge Instructions (Signed)
In addition to included general post-operative instructions,  Diet: Resume home diet. Recommend avoiding or limiting fatty/greasy foods over the next few days/week. If you do eat these, you may (or may not) notice diarrhea. This is expected while your body adjusts to not having a gallbladder, and it typically resolves with time.    Activity: No heavy lifting >20 pounds (children, pets, laundry, garbage) or strenuous activity for 4 weeks, but light activity and walking are encouraged. Do not drive or drink alcohol if taking narcotic pain medications or having pain that might distract from driving.  Wound care: You may shower/get incision wet with soapy water and pat dry (do not rub incisions), but no baths or submerging incision underwater until follow-up.   Medications: Resume all home medications. For mild to moderate pain: acetaminophen (Tylenol) or ibuprofen/naproxen (if no kidney disease). Combining Tylenol with alcohol can substantially increase your risk of causing liver disease. Narcotic pain medications, if prescribed, can be used for severe pain, though may cause nausea, constipation, and drowsiness. Do not combine Tylenol and Percocet (or similar) within a 6 hour period as Percocet (and similar) contain(s) Tylenol. If you do not need the narcotic pain medication, you do not need to fill the prescription.  Call office [phone] / [phone]) at any time if any questions, worsening pain, fevers/chills, bleeding, drainage from incision site, or other concerns.

## 2024-04-22 NOTE — Plan of Care (Signed)
  Problem: Education: Goal: Ability to describe self-care measures that may prevent or decrease complications (Diabetes Survival Skills Education) will improve Outcome: Progressing   Problem: Fluid Volume: Goal: Ability to maintain a balanced intake and output will improve Outcome: Progressing   Problem: Health Behavior/Discharge Planning: Goal: Ability to identify and utilize available resources and services will improve Outcome: Progressing

## 2024-04-22 NOTE — Progress Notes (Signed)
" °   04/22/24 0357  Vitals  Temp 99.7 F (37.6 C)  BP 116/65  MAP (mmHg) 80  BP Location Left Arm  BP Method Automatic  Patient Position (if appropriate) Lying  Pulse Rate (!) 102  Pulse Rate Source Monitor  Resp 18  MEWS COLOR  MEWS Score Color Green  Oxygen Therapy  SpO2 92 %  O2 Device Room Air  MEWS Score  MEWS Temp 0  MEWS Systolic 0  MEWS Pulse 1  MEWS RR 0  MEWS LOC 0  MEWS Score 1   Missed 0250 due time "

## 2024-04-22 NOTE — Discharge Summary (Signed)
 "  Physician Discharge Summary   Katherine Jimenez  female DOB: October 20, 1950  FMW:[id number]  PCP: Marylynn Verneita CROME, MD  Admit date: 04/20/2024 Discharge date: 04/22/2024  Admitted From: home Disposition:  home CODE STATUS: Full code  Discharge Instructions     No wound care   Complete by: As directed       Hospital Course:  For full details, please see H&P, progress notes, consult notes and ancillary notes.  Briefly,  Katherine Jimenez is a 74 y.o. year old female with medical history of hypertension, hyperlipidemia, type 2 diabetes, history of CVA presenting to the ED with right upper quadrant abdominal pain, nausea vomiting and diarrhea.    She had ultrasound the day prior that showed cholelithiasis without sonographic cholecystitis.  States her symptoms are worsening for the last 2 to 3 weeks and have been intermittent in nature.    CT abdomen pelvis without any acute findings but did show cholelithiasis along with moderate size hiatal hernia and moderate stool burden.    Right upper quadrant pain with nausea and vomiting acute cholelithiasis S/p cholecystectomy on 04/21/24 --clear for discharge the next day by GenSurg --outpatient f/u with GenSurg   Hypertension:  Continue home losartan .   history of CVA:  Continue home statin and ASA   GERD:  Symptoms are worsening given her hiatal hernia. Continue home PPI.     Bipolar disorder:  Continue home medicines as below   Elevated ALP:  Has been noted outpatient since 2023.  Likely related to cholelithiasis.   Type 2 diabetes:  Does not appear to be on any pharmacotherapy.  Recent A1c 5.8.   Unless noted above, medications under STOP list are ones pt was not taking PTA.  Discharge Diagnoses:  Principal Problem:   Right upper quadrant pain Active Problems:   Cholecystitis, acute   Hypertension   Bipolar disorder (HCC)   Overweight (BMI 25.0-29.9)   Vascular dementia, mild, with anxiety (HCC)   Hyperlipidemia  associated with type 2 diabetes mellitus (HCC)   History of CVA (cerebrovascular accident) without residual deficits   Elevated alkaline phosphatase level   DM type 2, controlled, with complication (HCC)   Symptomatic cholelithiasis     Discharge Instructions:  Allergies as of 04/22/2024       Reactions   Atorvastatin     Liver enzyme elevation    Celebrex  [celecoxib ] Other (See Comments)   Liver enzyme elevation    Thimerosal (thiomersal) Itching        Medication List     STOP taking these medications    Na Sulfate-K Sulfate-Mg Sulfate concentrate 17.5-3.13-1.6 GM/177ML Soln Commonly known as: SUPREP       TAKE these medications    acetaminophen  500 MG tablet Commonly known as: TYLENOL  Take 2 tablets (1,000 mg total) by mouth every 6 (six) hours as needed. What changed:  medication strength how much to take   aspirin  EC 81 MG tablet Take 81 mg by mouth daily. Swallow whole.   Biotin  5 MG Caps Take 2 capsules by mouth 2 (two) times daily.   BLACK COHOSH EXTRACT PO Take 1 capsule by mouth daily.   blood glucose meter kit and supplies Dispense Contour next bayer meter. E11.9 To check blood glucose  Once a day.   busPIRone  30 MG tablet Commonly known as: BUSPAR  Take 1 tablet (30 mg total) by mouth 2 (two) times daily as needed. Home med. What changed:  when to take this reasons to take this additional  instructions   DULoxetine  60 MG capsule Commonly known as: CYMBALTA  Take 1 capsule (60 mg total) by mouth daily.   freestyle lancets Use once daily to check blood sugars  E11.9   glucose blood test strip Commonly known as: FREESTYLE LITE Use once daily  as instructed to test blood sugar E 11.9   HYDROcodone -acetaminophen  5-325 MG tablet Commonly known as: NORCO/VICODIN Take 1 tablet by mouth every 6 (six) hours as needed for moderate pain (pain score 4-6).   lamoTRIgine  200 MG tablet Commonly known as: LAMICTAL  TAKE 1 TABLET BY MOUTH 2 TIMES A  DAY   losartan  50 MG tablet Commonly known as: COZAAR  Take 1 tablet (50 mg total) by mouth daily.   meloxicam  15 MG tablet Commonly known as: MOBIC  Take 15 mg by mouth daily.   methocarbamol 500 MG tablet Commonly known as: ROBAXIN Take 500 mg by mouth every 8 (eight) hours as needed.   omeprazole  40 MG capsule Commonly known as: PRILOSEC Take 1 capsule (40 mg total) by mouth daily.   Ozempic  (0.25 or 0.5 MG/DOSE) 2 MG/1.5ML Sopn Generic drug: Semaglutide (0.25 or 0.5MG /DOS) Inject 0.25 mg into the skin once a week.   trospium  20 MG tablet Commonly known as: SANCTURA  Take 1 tablet (20 mg total) by mouth 2 (two) times daily.   Vitamin D3 25 MCG (1000 UT) Caps Take 1,000 Units by mouth daily.   zolpidem  10 MG tablet Commonly known as: AMBIEN  TAKE 1 TABLET BY MOUTH EVERY NIGHT AT BEDTIME AS NEEDED FOR SLEEP What changed: when to take this         Follow-up Information     Jordis Laneta FALCON, MD. Go on 05/07/2024.   Specialty: General Surgery Why: Go to appointment on 02/04 at 1130 AM Contact information: [address] [place] [address] [phone]         Marylynn Verneita CROME, MD Follow up in 1 week(s).   Specialty: Internal Medicine Contact information: [address] [place] [address] [phone]                 Allergies[1]   The results of significant diagnostics from this hospitalization (including imaging, microbiology, ancillary and laboratory) are listed below for reference.   Consultations:   Procedures/Studies: CT ABDOMEN PELVIS W CONTRAST Result Date: 04/20/2024 EXAM: CT ABDOMEN AND PELVIS WITH CONTRAST 04/20/2024 05:34:30 PM TECHNIQUE: CT of the abdomen and pelvis was performed with the administration of 100 mL of iohexol  (OMNIPAQUE ) 300 MG/ML solution. Multiplanar reformatted images are provided for review. Automated exposure control, iterative reconstruction, and/or weight-based adjustment of the  mA/kV was utilized to reduce the radiation dose to as low as reasonably achievable. COMPARISON: CT abdomen and pelvis 08/19/2018. CLINICAL HISTORY: Abdominal pain, acute, nonlocalized; upper abd pain, n/v/d. FINDINGS: LOWER CHEST: No acute abnormality. LIVER: The liver is unremarkable. GALLBLADDER AND BILE DUCTS: Gallstones are present. No biliary ductal dilatation. SPLEEN: No acute abnormality. PANCREAS: No acute abnormality. ADRENAL GLANDS: No acute abnormality. KIDNEYS, URETERS AND BLADDER: No stones in the kidneys or ureters. No hydronephrosis. No perinephric or periureteral stranding. There is limited evaluation of the bladder secondary to streak artifact. GI AND BOWEL: There is a moderate sized hiatal hernia containing the proximal stomach which has increased in size. There is a large amount of stool throughout the colon. There is sigmoid colon diverticulosis. The appendix appears normal. There is no bowel obstruction. PERITONEUM AND RETROPERITONEUM: No ascites. No free air. VASCULATURE: There are atherosclerotic calcifications of the  aorta. Aorta is normal in caliber. LYMPH NODES: No lymphadenopathy. REPRODUCTIVE ORGANS: No acute abnormality. BONES AND SOFT TISSUES: Bilateral hip arthroplasties are present. There is scoliosis of the thoracolumbar spine. No acute osseous abnormality. No focal soft tissue abnormality. IMPRESSION: 1. No acute findings. 2. Moderate sized hiatal hernia containing the proximal stomach, increased in size compared to 2020. 3. Sigmoid colon diverticulosis without evidence of diverticulitis. 4. Large amount of stool throughout the colon. 5. Cholelithiasis. Electronically signed by: Greig Pique MD 04/20/2024 05:54 PM EST RP Workstation: [id number]   DG Chest 2 View Result Date: 04/20/2024 EXAM: 2 VIEW(S) XRAY OF THE CHEST 04/20/2024 03:13:00 PM COMPARISON: 02/15/2024 CLINICAL HISTORY: chest pain chest pain chest pain FINDINGS: LUNGS AND PLEURA: No focal pulmonary opacity. No pleural  effusion. No pneumothorax. HEART AND MEDIASTINUM: Small hiatal hernia. No acute abnormality of the cardiac silhouette. BONES AND SOFT TISSUES: S shaped thoracolumbar spine scoliosis. Thoracic degenerative changes. IMPRESSION: 1. No acute cardiopulmonary abnormality. 2. Small hiatal hernia. Electronically signed by: Greig Pique MD 04/20/2024 04:01 PM EST RP Workstation: [id number]   US  Abdomen Limited RUQ (LIVER/GB) Result Date: 04/19/2024 CLINICAL DATA:  CHEST PAIN, ATYPICAL. TAKING OZEMPIC  RULE OUT GALLSTONES EXAM: ULTRASOUND ABDOMEN LIMITED RIGHT UPPER QUADRANT COMPARISON:  March 3rd 2023, February 15, 2024 FINDINGS: Gallbladder: Multiple cholelithiasis. No wall thickening visualized. No sonographic Murphy sign noted by sonographer. Common bile duct: Diameter: Visualized portion measures 3 mm, within normal limits. Liver: No definitive focal lesion identified. Portions are suboptimally assessed secondary to shadowing bowel gas. Within the upper limits of normal in parenchymal echogenicity. Portal vein is patent on color Doppler imaging with normal direction of blood flow towards the liver. Other: Large hiatal hernia, better seen on prior chest CT. IMPRESSION: 1. Cholelithiasis without sonographic evidence of acute cholecystitis. 2. Large hiatal hernia, better seen on prior chest CT. Electronically Signed   By: Corean Salter M.D.   On: 04/19/2024 13:11      Labs: BNP (last 3 results) No results for input(s): BNP in the last 8760 hours. Basic Metabolic Panel: Recent Labs  Lab 04/20/24 1437 04/21/24 0832  NA 135 137  K 4.0 4.4  CL 98 101  CO2 25 29  GLUCOSE 154* 102*  BUN 11 10  CREATININE 0.83 0.78  CALCIUM  9.7 9.3  MG 2.1  --    Liver Function Tests: Recent Labs  Lab 04/20/24 1437  AST 16  ALT 11  ALKPHOS 134*  BILITOT 0.3  PROT 7.4  ALBUMIN 4.5   Recent Labs  Lab 04/20/24 1437  LIPASE 23   No results for input(s): AMMONIA in the last 168 hours. CBC: Recent Labs   Lab 04/20/24 1437 04/21/24 0832  WBC 7.5 5.1  NEUTROABS 3.2  --   HGB 13.9 11.4*  HCT 43.4 35.7*  MCV 94.1 94.7  PLT 440* 315   Cardiac Enzymes: No results for input(s): CKTOTAL, CKMB, CKMBINDEX, TROPONINI in the last 168 hours. BNP: Invalid input(s): POCBNP CBG: Recent Labs  Lab 04/21/24 1142 04/21/24 1321 04/21/24 1545 04/21/24 2101 04/22/24 0807  GLUCAP 97 96 166* 103* 115*   D-Dimer No results for input(s): DDIMER in the last 72 hours. Hgb A1c No results for input(s): HGBA1C in the last 72 hours. Lipid Profile No results for input(s): CHOL, HDL, LDLCALC, TRIG, CHOLHDL, LDLDIRECT in the last 72 hours. Thyroid  function studies No results for input(s): TSH, T4TOTAL, T3FREE, THYROIDAB in the last 72 hours.  Invalid input(s): FREET3 Anemia work up No results for input(s): VITAMINB12, FOLATE,  FERRITIN, TIBC, IRON , RETICCTPCT in the last 72 hours. Urinalysis    Component Value Date/Time   COLORURINE YELLOW 11/07/2023 0924   APPEARANCEUR Clear 12/31/2023 1509   LABSPEC 1.025 11/07/2023 0924   PHURINE 6.0 11/07/2023 0924   GLUCOSEU Negative 12/31/2023 1509   GLUCOSEU NEGATIVE 11/07/2023 0924   HGBUR NEGATIVE 11/07/2023 0924   BILIRUBINUR negative 02/26/2024 0919   BILIRUBINUR Negative 12/31/2023 1509   KETONESUR TRACE (A) 11/07/2023 0924   PROTEINUR Positive (A) 02/26/2024 0919   PROTEINUR Trace 12/31/2023 1509   PROTEINUR NEGATIVE 08/06/2021 1258   UROBILINOGEN 0.2 02/26/2024 0919   UROBILINOGEN 1.0 11/07/2023 0924   NITRITE positive (A) 02/26/2024 0919   NITRITE Positive (A) 12/31/2023 1509   NITRITE NEGATIVE 11/07/2023 0924   LEUKOCYTESUR Large (3+) (A) 02/26/2024 0919   LEUKOCYTESUR 1+ (A) 12/31/2023 1509   LEUKOCYTESUR NEGATIVE 11/07/2023 0924   Sepsis Labs Recent Labs  Lab 04/20/24 1437 04/21/24 0832  WBC 7.5 5.1   Microbiology No results found for this or any previous visit (from the past 240  hours).   Total time spend on discharging this patient, including the last patient exam, discussing the hospital stay, instructions for ongoing care as it relates to all pertinent caregivers, as well as preparing the medical discharge records, prescriptions, and/or referrals as applicable, is 35 minutes.    Ellouise Haber, MD  [facility] 04/22/2024, 8:25 AM       [1]  Allergies Allergen Reactions   Atorvastatin      Liver enzyme elevation    Celebrex  [Celecoxib ] Other (See Comments)    Liver enzyme elevation    Thimerosal (Thiomersal) Itching   "

## 2024-04-22 NOTE — Anesthesia Postprocedure Evaluation (Signed)
"   Anesthesia Post Note  Patient: Katherine Jimenez  Procedure(s) Performed: CHOLECYSTECTOMY, ROBOT-ASSISTED, LAPAROSCOPIC  Patient location during evaluation: PACU Anesthesia Type: General Level of consciousness: awake and alert Pain management: pain level controlled Vital Signs Assessment: post-procedure vital signs reviewed and stable Respiratory status: spontaneous breathing, nonlabored ventilation and respiratory function stable Cardiovascular status: blood pressure returned to baseline and stable Postop Assessment: no apparent nausea or vomiting Anesthetic complications: no   No notable events documented.   Last Vitals:  Vitals:   04/22/24 0050 04/22/24 0357  BP: 99/64 116/65  Pulse: (!) 116 (!) 102  Resp: 16 18  Temp: 37.4 C 37.6 C  SpO2: 96% 92%    Last Pain:  Vitals:   04/22/24 0050  TempSrc: Oral  PainSc:                  Katherine Jimenez      "

## 2024-04-22 NOTE — Telephone Encounter (Signed)
 SABRA

## 2024-04-22 NOTE — Progress Notes (Signed)
" °   04/22/24 0050  Assess: MEWS Score  Temp 99.3 F (37.4 C)  BP 99/64  MAP (mmHg) 73  Pulse Rate (!) 116  Resp 16  SpO2 96 %  O2 Device Nasal Cannula  Assess: MEWS Score  MEWS Temp 0  MEWS Systolic 1  MEWS Pulse 2  MEWS RR 0  MEWS LOC 0  MEWS Score 3  MEWS Score Color Yellow  Assess: if the MEWS score is Yellow or Red  Were vital signs accurate and taken at a resting state? Yes  Does the patient meet 2 or more of the SIRS criteria? No  MEWS guidelines implemented  Yes, yellow  Treat  MEWS Interventions Considered administering scheduled or prn medications/treatments as ordered  Take Vital Signs  Increase Vital Sign Frequency  Yellow: Q2hr x1, continue Q4hrs until patient remains green for 12hrs  Escalate  MEWS: Escalate Yellow: Discuss with charge nurse and consider notifying provider and/or RRT  Notify: Charge Nurse/RN  Name of Charge Nurse/RN Notified Dontavian Marchi turner  Assess: SIRS CRITERIA  SIRS Temperature  0  SIRS Respirations  0  SIRS Pulse 1  SIRS WBC 0  SIRS Score Sum  1   Heart rate in the 120's will implement MEWS guideline "

## 2024-04-22 NOTE — TOC CM/SW Note (Signed)
 Transition of Care [facility]) - Inpatient Brief Assessment   Patient Details  Name: LILLYEN SCHOW MRN: [id number] Date of Birth: Aug 27, 1950  Transition of [facility]) CM/SW Contact:    Daved JONETTA Hamilton, RN Phone Number: 04/22/2024, 9:07 AM   Clinical Narrative:   Transition of Care (TOC) Screening Note   Patient Details  Name: MARLEEN MORET Date of Birth: [birth year]   [facility]) CM/SW Contact:    Daved JONETTA Hamilton, RN Phone Number: 04/22/2024, 9:07 AM    Transition of Care Department [facility]) has reviewed patient and no TOC needs have been identified at this time. If new patient transition needs arise, please place a TOC consult.    Transition of Care Asessment: Insurance and Status: Insurance coverage has been reviewed Patient has primary care physician: Yes   Prior level of function:: Independent Prior/Current Home Services: No current home services Social Drivers of Health Review: SDOH reviewed no interventions necessary Readmission risk has been reviewed: No (Patient in Observation, no score generated) Transition of care needs: no transition of care needs at this time

## 2024-04-22 NOTE — Progress Notes (Signed)
 [facility] SURGICAL PROGRESS NOTE  Hospital Day(s): 0.   Post op day(s): 1 Day Post-Op.   Interval History:  Patient seen and examined Did have tachycardia overnight but reports she had a lot of pain when returning to the room; this is markedly improved now Patient reports she feels much better; abdomen is sore No fever, chills, nausea, emesis No new labs this morning Diet advanced; no issues   Vital signs in last 24 hours: [min-max] current  Temp:  [96.8 F (36 C)-99.7 F (37.6 C)] 97.5 F (36.4 C) (01/20 0816) Pulse Rate:  [85-116] 98 (01/20 0816) Resp:  [12-23] 18 (01/20 0816) BP: (92-186)/(64-115) 98/73 (01/20 0816) SpO2:  [85 %-99 %] 94 % (01/20 0816) Weight:  [71 kg] 71 kg (01/20 0500)     Height: 5' (152.4 cm) Weight: 71 kg BMI (Calculated): 30.57   Intake/Output last 2 shifts:  01/19 0701 - 01/20 0700 In: 1400 [I.V.:1100; IV Piggyback:300] Out: 10 [Blood:10]   Physical Exam:  Constitutional: alert, cooperative and no distress  Respiratory: breathing non-labored at rest  Cardiovascular: regular rate and sinus rhythm  Gastrointestinal: soft, incisional tenderness, and non-distended Integumentary: laparoscopic incisions are CDI with dermabond, no erythema, early ecchymosis   Labs:     Latest Ref Rng & Units 04/21/2024    8:32 AM 04/20/2024    2:37 PM 03/03/2024    9:22 AM  CBC  WBC 4.0 - 10.5 K/uL 5.1  7.5  6.2   Hemoglobin 12.0 - 15.0 g/dL 88.5  86.0  86.9   Hematocrit 36.0 - 46.0 % 35.7  43.4  40.0   Platelets 150 - 400 K/uL 315  440  415       Latest Ref Rng & Units 04/21/2024    8:32 AM 04/20/2024    2:37 PM 02/15/2024   11:41 AM  CMP  Glucose 70 - 99 mg/dL 897  845  856   BUN 8 - 23 mg/dL 10  11  11    Creatinine 0.44 - 1.00 mg/dL 9.21  9.16  9.32   Sodium 135 - 145 mmol/L 137  135  136   Potassium 3.5 - 5.1 mmol/L 4.4  4.0  4.6   Chloride 98 - 111 mmol/L 101  98  100   CO2 22 - 32 mmol/L 29  25  25    Calcium  8.9 - 10.3 mg/dL 9.3   9.7  9.6   Total Protein 6.5 - 8.1 g/dL  7.4    Total Bilirubin 0.0 - 1.2 mg/dL  0.3    Alkaline Phos 38 - 126 U/L  134    AST 15 - 41 U/L  16    ALT 0 - 44 U/L  11       Imaging studies: No new pertinent imaging studies   Assessment/Plan:  74 y.o. female 1 Day Post-Op s/p robotic assisted laparoscopic cholecystectomy for symptomatic cholelithiasis   - Okay to continue diet as tolerated - No need for Abx - Monitor abdominal examination; on-going bowel function    - Pain control prn; antiemetics prn   - Mobilize   - Further management per primary service     - Discharge Planning: Okay for discharge from surgical perspective, follow up arranged, instructions updated   All of the above findings and recommendations were discussed with the patient, and the medical team, and all of patient's questions were answered to her expressed satisfaction.  -- Arthea Platt, PA-C [facility] 04/22/2024, 8:27 AM M-F: 7am -  4pm

## 2024-04-23 ENCOUNTER — Telehealth: Payer: Self-pay

## 2024-04-23 LAB — SURGICAL PATHOLOGY

## 2024-04-23 NOTE — Transitions of Care (Post Inpatient/ED Visit) (Unsigned)
" ° °  04/23/2024  Name: Katherine Jimenez MRN: [id number] DOB: [birth year]  Today's TOC FU Call Status: Today's TOC FU Call Status:: Unsuccessful Call (1st Attempt) Unsuccessful Call (1st Attempt) Date: 04/23/24  Attempted to reach the patient regarding the most recent Inpatient/ED visit.  Follow Up Plan: Additional outreach attempts will be made to reach the patient to complete the Transitions of Care (Post Inpatient/ED visit) call.   Signature Julian Lemmings, LPN [facility] Nurse Health Advisor Direct Dial [phone]  "

## 2024-04-24 NOTE — Transitions of Care (Post Inpatient/ED Visit) (Signed)
 "  04/24/2024  Name: Katherine Jimenez MRN: [id number] DOB: Oct 15, 1950  Today's TOC FU Call Status: Today's TOC FU Call Status:: Successful TOC FU Call Completed Unsuccessful Call (1st Attempt) Date: 04/23/24 [facility] FU Call Complete Date: 04/24/24  Patient's Name and Date of Birth confirmed. Name, DOB  Transition Care Management Follow-up Telephone Call Date of Discharge: 04/22/24 Discharge Facility: [facility]) Type of Discharge: Inpatient Admission Primary Inpatient Discharge Diagnosis:: calculus gallbladder How have you been since you were released from the hospital?: Better Any questions or concerns?: No  Items Reviewed: Did you receive and understand the discharge instructions provided?: Yes Medications obtained,verified, and reconciled?: Yes (Medications Reviewed) Any new allergies since your discharge?: No Dietary orders reviewed?: Yes Do you have support at home?: Yes People in Home [RPT]: sibling(s)  Medications Reviewed Today: Medications Reviewed Today     Reviewed by Emmitt Pan, LPN (Licensed Practical Nurse) on 04/24/24 at 1048  Med List Status: <None>   Medication Order Taking? Sig Documenting Provider Last Dose Status Informant  acetaminophen  (TYLENOL ) 500 MG tablet [id number] Yes Take 2 tablets (1,000 mg total) by mouth every 6 (six) hours as needed. Awanda City, MD  Active   aspirin  EC 81 MG tablet [id number] Yes Take 81 mg by mouth daily. Swallow whole. [provider]  Active Self  Biotin  5 MG CAPS [id number] Yes Take 2 capsules by mouth 2 (two) times daily.  [provider]  Active Self  BLACK COHOSH EXTRACT PO [id number] Yes Take 1 capsule by mouth daily. [provider]  Active Self  blood glucose meter kit and supplies [id number] Yes Dispense Contour next bayer meter. E11.9 To check blood glucose  Once a day. Marylynn Verneita CROME, MD  Active Self  busPIRone  (BUSPAR ) 30 MG tablet [id number] Yes Take 1 tablet (30  mg total) by mouth 2 (two) times daily as needed. Home med. Awanda City, MD  Active   Cholecalciferol  (VITAMIN D3) 1000 units CAPS [id number] Yes Take 1,000 Units by mouth daily.  [provider]  Active Self  DULoxetine  (CYMBALTA ) 60 MG capsule [id number] Yes Take 1 capsule (60 mg total) by mouth daily. Marylynn Verneita CROME, MD  Active Self  glucose blood (FREESTYLE LITE) test strip [id number] Yes Use once daily  as instructed to test blood sugar E 11.9 Marylynn Verneita CROME, MD  Active Self  HYDROcodone -acetaminophen  (NORCO/VICODIN) 5-325 MG tablet [id number] Yes Take 1 tablet by mouth every 6 (six) hours as needed for moderate pain (pain score 4-6). Awanda City, MD  Active   lamoTRIgine  (LAMICTAL ) 200 MG tablet [id number] Yes TAKE 1 TABLET BY MOUTH 2 TIMES A DAY Marylynn Verneita CROME, MD  Active Self  Lancets (FREESTYLE) lancets [id number] Yes Use once daily to check blood sugars  E11.9 Marylynn Verneita CROME, MD  Active Self  losartan  (COZAAR ) 50 MG tablet [id number] Yes Take 1 tablet (50 mg total) by mouth daily. Marylynn Verneita CROME, MD  Active Self  meloxicam  (MOBIC ) 15 MG tablet [id number] Yes Take 15 mg by mouth daily. [provider]  Active Self  methocarbamol (ROBAXIN) 500 MG tablet [id number] Yes Take 500 mg by mouth every 8 (eight) hours as needed. [provider]  Active Self  omeprazole  (PRILOSEC) 40 MG capsule [id number] Yes Take 1 capsule (40 mg total) by mouth daily. Marylynn Verneita CROME, MD  Active Self  Semaglutide ,0.25 or 0.5MG /DOS, (OZEMPIC , 0.25 OR 0.5 MG/DOSE,) 2 MG/1.5ML SOPN [id number] Yes Inject 0.25 mg into the skin once a week. Arnett,  Rollene MATSU, FNP  Active Self  trospium  (SANCTURA ) 20 MG tablet [id number] Yes Take 1 tablet (20 mg total) by mouth 2 (two) times daily. Twylla Glendia BROCKS, MD  Active Self  zolpidem  (AMBIEN ) 10 MG tablet [id number] Yes TAKE 1 TABLET BY MOUTH EVERY NIGHT AT BEDTIME AS NEEDED FOR SLEEP  Patient taking differently: Take 10 mg by mouth at bedtime. for sleep   Marylynn Verneita CROME, MD  Active Self            Home Care and Equipment/Supplies: Were Home Health Services Ordered?: NA Any new equipment or medical supplies ordered?: NA  Functional Questionnaire: Do you need assistance with bathing/showering or dressing?: No Do you need assistance with meal preparation?: No Do you need assistance with eating?: No Do you have difficulty maintaining continence: No Do you need assistance with getting out of bed/getting out of a chair/moving?: No Do you have difficulty managing or taking your medications?: No  Follow up appointments reviewed: PCP Follow-up appointment confirmed?: Yes Date of PCP follow-up appointment?: 04/29/24 Follow-up Provider: [facility] Follow-up appointment confirmed?: Yes Date of Specialist follow-up appointment?: 05/07/24 Follow-Up Specialty Provider:: surgeon Do you need transportation to your follow-up appointment?: No Do you understand care options if your condition(s) worsen?: Yes-patient verbalized understanding    SIGNATURE Julian Lemmings, LPN [facility] Nurse Health Advisor Direct Dial [phone]  "

## 2024-04-27 ENCOUNTER — Other Ambulatory Visit: Payer: Self-pay | Admitting: Surgery

## 2024-04-27 MED ORDER — AMOXICILLIN-POT CLAVULANATE 875-125 MG PO TABS: 1.0000 | ORAL_TABLET | Freq: Two times a day (BID) | ORAL | 0 refills | Status: AC

## 2024-04-27 NOTE — Progress Notes (Signed)
 Patient called answering service concerned about her umbilical incision.  She's s/p robotic cholecystectomy with Dr. Jordis on 04/21/24.  It was a bit tender and had started having some thicker drainage.  Denied any significant erythema.  She reports that she does not drive, so would be difficult to come to ED.  As a precaution, will send prescription for Augmentin  for presumed wound infection. She's able to ask someone to pick up a prescription for her.  Has appointment on 05/08/23 with Dr. Jordis.    Aloysius Plant, MD

## 2024-04-29 ENCOUNTER — Inpatient Hospital Stay: Admitting: Internal Medicine

## 2024-05-01 ENCOUNTER — Inpatient Hospital Stay

## 2024-05-07 ENCOUNTER — Encounter: Admitting: Surgery

## 2024-05-07 ENCOUNTER — Telehealth: Payer: Self-pay

## 2024-05-07 NOTE — Telephone Encounter (Signed)
Attempted to call pt. No answer. Mail box is full.  

## 2024-05-07 NOTE — Telephone Encounter (Signed)
 Copied from CRM #[id number]. Topic: Appointments - Scheduling Inquiry for Clinic >> May 07, 2024 11:43 AM Winona R wrote: Pt had her gall bladder removed on Jan 19th and would like to know if she needs to come in as she has a follow up with her sergeon on Feb 9th

## 2024-05-08 ENCOUNTER — Inpatient Hospital Stay

## 2024-05-08 ENCOUNTER — Inpatient Hospital Stay: Admitting: Oncology

## 2024-05-09 NOTE — Telephone Encounter (Signed)
 Spoke with Katherine Jimenez and she stated that she is going to hold off on scheduling an appt for right now because she has so many other appts coming up.

## 2024-05-12 ENCOUNTER — Encounter: Admitting: Surgery

## 2024-06-02 ENCOUNTER — Ambulatory Visit: Admitting: Urology

## 2024-08-13 ENCOUNTER — Ambulatory Visit
